# Patient Record
Sex: Female | Born: 1960 | Race: White | Hispanic: No | Smoking: Former smoker
Health system: Southern US, Community
[De-identification: ages and names within clinical notes are randomized; demographics above are authoritative.]

## PROBLEM LIST (undated history)

## (undated) DIAGNOSIS — F1020 Alcohol dependence, uncomplicated: Secondary | ICD-10-CM

## (undated) DIAGNOSIS — K089 Disorder of teeth and supporting structures, unspecified: Secondary | ICD-10-CM

## (undated) DIAGNOSIS — Z9981 Dependence on supplemental oxygen: Secondary | ICD-10-CM

## (undated) DIAGNOSIS — F32A Depression, unspecified: Secondary | ICD-10-CM

## (undated) DIAGNOSIS — N393 Stress incontinence (female) (male): Secondary | ICD-10-CM

## (undated) DIAGNOSIS — L409 Psoriasis, unspecified: Secondary | ICD-10-CM

## (undated) DIAGNOSIS — F329 Major depressive disorder, single episode, unspecified: Secondary | ICD-10-CM

## (undated) DIAGNOSIS — J449 Chronic obstructive pulmonary disease, unspecified: Secondary | ICD-10-CM

## (undated) DIAGNOSIS — J302 Other seasonal allergic rhinitis: Secondary | ICD-10-CM

## (undated) HISTORY — DX: Other seasonal allergic rhinitis: J30.2

## (undated) HISTORY — DX: Chronic obstructive pulmonary disease, unspecified: J44.9

## (undated) HISTORY — PX: TUBAL LIGATION: SHX77

## (undated) HISTORY — DX: Disorder of teeth and supporting structures, unspecified: K08.9

## (undated) HISTORY — DX: Alcohol dependence, uncomplicated: F10.20

---

## 1997-12-22 ENCOUNTER — Encounter: Admission: RE | Admit: 1997-12-22 | Discharge: 1997-12-22 | Payer: Self-pay | Admitting: Sports Medicine

## 1999-12-12 ENCOUNTER — Encounter: Admission: RE | Admit: 1999-12-12 | Discharge: 1999-12-12 | Payer: Self-pay | Admitting: Sports Medicine

## 2002-03-05 ENCOUNTER — Emergency Department (HOSPITAL_COMMUNITY): Admission: EM | Admit: 2002-03-05 | Discharge: 2002-03-05 | Payer: Self-pay | Admitting: Emergency Medicine

## 2008-02-04 ENCOUNTER — Ambulatory Visit: Payer: Self-pay | Admitting: Internal Medicine

## 2008-02-04 ENCOUNTER — Inpatient Hospital Stay (HOSPITAL_COMMUNITY): Admission: EM | Admit: 2008-02-04 | Discharge: 2008-02-09 | Payer: Self-pay | Admitting: Emergency Medicine

## 2008-02-05 ENCOUNTER — Encounter (INDEPENDENT_AMBULATORY_CARE_PROVIDER_SITE_OTHER): Payer: Self-pay | Admitting: Internal Medicine

## 2008-03-09 ENCOUNTER — Ambulatory Visit: Payer: Self-pay | Admitting: Internal Medicine

## 2008-03-09 DIAGNOSIS — F17200 Nicotine dependence, unspecified, uncomplicated: Secondary | ICD-10-CM

## 2008-03-09 DIAGNOSIS — F1020 Alcohol dependence, uncomplicated: Secondary | ICD-10-CM | POA: Insufficient documentation

## 2008-03-09 DIAGNOSIS — J449 Chronic obstructive pulmonary disease, unspecified: Secondary | ICD-10-CM

## 2008-03-31 ENCOUNTER — Ambulatory Visit: Payer: Self-pay | Admitting: Internal Medicine

## 2008-04-15 ENCOUNTER — Encounter: Payer: Self-pay | Admitting: Internal Medicine

## 2008-05-04 ENCOUNTER — Ambulatory Visit: Payer: Self-pay | Admitting: Family Medicine

## 2008-06-30 ENCOUNTER — Ambulatory Visit: Payer: Self-pay | Admitting: *Deleted

## 2008-06-30 ENCOUNTER — Ambulatory Visit: Payer: Self-pay | Admitting: Internal Medicine

## 2008-08-05 ENCOUNTER — Ambulatory Visit: Payer: Self-pay | Admitting: Internal Medicine

## 2009-07-28 ENCOUNTER — Ambulatory Visit: Payer: Self-pay | Admitting: Internal Medicine

## 2009-08-18 ENCOUNTER — Ambulatory Visit: Payer: Self-pay | Admitting: Internal Medicine

## 2009-09-23 ENCOUNTER — Ambulatory Visit: Payer: Self-pay | Admitting: Internal Medicine

## 2009-09-23 ENCOUNTER — Encounter (INDEPENDENT_AMBULATORY_CARE_PROVIDER_SITE_OTHER): Payer: Self-pay | Admitting: Adult Health

## 2009-09-23 ENCOUNTER — Telehealth (INDEPENDENT_AMBULATORY_CARE_PROVIDER_SITE_OTHER): Payer: Self-pay | Admitting: *Deleted

## 2009-09-23 LAB — CONVERTED CEMR LAB
Basophils Absolute: 0 10*3/uL (ref 0.0–0.1)
Basophils Relative: 0 % (ref 0–1)
Eosinophils Absolute: 0 10*3/uL (ref 0.0–0.7)
Eosinophils Relative: 0 % (ref 0–5)
HCT: 43.3 % (ref 36.0–46.0)
Hemoglobin: 14.5 g/dL (ref 12.0–15.0)
Lymphocytes Relative: 19 % (ref 12–46)
Lymphs Abs: 1.7 10*3/uL (ref 0.7–4.0)
MCHC: 33.5 g/dL (ref 30.0–36.0)
MCV: 95.8 fL (ref 78.0–100.0)
Monocytes Absolute: 1.1 10*3/uL — ABNORMAL HIGH (ref 0.1–1.0)
Monocytes Relative: 12 % (ref 3–12)
Neutro Abs: 6.4 10*3/uL (ref 1.7–7.7)
Neutrophils Relative %: 69 % (ref 43–77)
Platelets: 288 10*3/uL (ref 150–400)
RBC: 4.52 M/uL (ref 3.87–5.11)
RDW: 12.5 % (ref 11.5–15.5)
WBC: 9.2 10*3/uL (ref 4.0–10.5)

## 2009-11-24 ENCOUNTER — Ambulatory Visit: Payer: Self-pay | Admitting: Internal Medicine

## 2009-12-12 ENCOUNTER — Ambulatory Visit: Admission: RE | Admit: 2009-12-12 | Discharge: 2009-12-12 | Payer: Self-pay | Admitting: Internal Medicine

## 2010-08-01 ENCOUNTER — Ambulatory Visit: Payer: Self-pay | Admitting: Internal Medicine

## 2010-08-14 ENCOUNTER — Telehealth (INDEPENDENT_AMBULATORY_CARE_PROVIDER_SITE_OTHER): Payer: Self-pay | Admitting: *Deleted

## 2010-08-24 ENCOUNTER — Ambulatory Visit: Payer: Self-pay | Admitting: Internal Medicine

## 2010-08-24 DIAGNOSIS — F4323 Adjustment disorder with mixed anxiety and depressed mood: Secondary | ICD-10-CM

## 2010-09-12 ENCOUNTER — Telehealth: Payer: Self-pay | Admitting: Internal Medicine

## 2010-09-18 ENCOUNTER — Inpatient Hospital Stay (HOSPITAL_COMMUNITY)
Admission: EM | Admit: 2010-09-18 | Discharge: 2010-09-22 | Payer: Self-pay | Source: Home / Self Care | Attending: Internal Medicine | Admitting: Internal Medicine

## 2010-09-19 ENCOUNTER — Telehealth: Payer: Self-pay | Admitting: Internal Medicine

## 2010-09-20 ENCOUNTER — Other Ambulatory Visit: Payer: Self-pay | Admitting: Internal Medicine

## 2010-09-20 LAB — BASIC METABOLIC PANEL
BUN: 9 mg/dL (ref 6–23)
CO2: 31 mEq/L (ref 19–32)
Calcium: 8.9 mg/dL (ref 8.4–10.5)
Chloride: 95 mEq/L — ABNORMAL LOW (ref 96–112)
Creatinine, Ser: 0.65 mg/dL (ref 0.4–1.2)
GFR calc Af Amer: >60 mL/min (ref >60)
GFR calc non Af Amer: >60 mL/min (ref >60)
Glucose, Bld: 140 mg/dL — ABNORMAL HIGH (ref 70–99)
Potassium: 4.1 mEq/L (ref 3.5–5.1)
Sodium: 133 mEq/L — ABNORMAL LOW (ref 135–145)

## 2010-09-20 LAB — CBC
HCT: 42.3 % (ref 36.0–46.0)
Hemoglobin: 14 g/dL (ref 12.0–15.0)
MCH: 32.1 pg (ref 26.0–34.0)
MCHC: 33.1 g/dL (ref 30.0–36.0)
MCV: 97 fL (ref 78.0–100.0)
Platelets: 245 10*3/uL (ref 150–400)
RBC: 4.36 MIL/uL (ref 3.87–5.11)
RDW: 12.2 % (ref 11.5–15.5)
WBC: 10.3 10*3/uL (ref 4.0–10.5)

## 2010-09-21 LAB — BASIC METABOLIC PANEL
BUN: 9 mg/dL (ref 6–23)
CO2: 31 mEq/L (ref 19–32)
Calcium: 9 mg/dL (ref 8.4–10.5)
Chloride: 96 mEq/L (ref 96–112)
Creatinine, Ser: 0.82 mg/dL (ref 0.4–1.2)
GFR calc Af Amer: >60 mL/min (ref >60)
GFR calc non Af Amer: >60 mL/min (ref >60)
Glucose, Bld: 127 mg/dL — ABNORMAL HIGH (ref 70–99)
Potassium: 3.3 mEq/L — ABNORMAL LOW (ref 3.5–5.1)
Sodium: 136 mEq/L (ref 135–145)

## 2010-09-21 LAB — TSH: TSH: 0.197 u[IU]/mL — ABNORMAL LOW (ref 0.350–4.500)

## 2010-09-21 LAB — T4, FREE: Free T4: 0.9 ng/dL (ref 0.80–1.80)

## 2010-10-10 ENCOUNTER — Encounter: Payer: Self-pay | Admitting: Internal Medicine

## 2010-10-19 NOTE — Progress Notes (Signed)
Summary: ventalin refill  Phone Note Call from Patient Call back at Home Phone [phone]   Caller: Patient Call For: young Reason for Call: Talk to Nurse Summary of Call: Patient needing ventalin refill. [facility] Initial call taken by: Lehman Prom,  September 12, 2010 3:20 PM  Follow-up for Phone Call        refill sent. pt aware.Carron Curie CMA  September 12, 2010 4:10 PM     Prescriptions: VENTOLIN HFA 108 (90 BASE) MCG/ACT AERS (ALBUTEROL SULFATE) 2 puffs four times a day as needed  #1 x 3   Entered by:   Carron Curie CMA   Authorized by:   Waymon Budge MD   Signed by:   Carron Curie CMA on 09/12/2010   Method used:   [facility]        [facility] [phone][facility] (retail)       [address]       [place], [place]  [address]       Ph: [phone]       Fax: [phone]   RxID:   [id number]

## 2010-10-19 NOTE — Assessment & Plan Note (Signed)
Summary: switch from MR to CY//td   Copy to:  Dr. Marcellus Scott Primary Provider/Referring Provider:  [facility]  CC:  Pulmonary reestblish-changed from MR to CDY.Marland Kitchen  History of Present Illness: 2010/09/17- 49 yoF seeking to change care from [facility] since her physician there is leaving. Long time smoker with known COPD. Treated here previously in 2009 . PFT then confirmed severe COPD with FEV1 28% predicted. More recent PFT at [facility] in 3/ 2011 w/ results pending. Trying to stop smoking using patches.  DOE with usual activities, less than 1 flight of stairs, making bed,  and sometimes at rest. Complains of insomnia, but breathing doesn't wake her. Daily cough with clear phlegm. Extremes of heat and cold take her breath. Never had O2. Spiriva worked well, but couldn't afford it. Advair less helpful. Has used a lot of Primatine which is going off market. Uses Ventolin 3-4 x/ day, or home neb albutrerol 0.083.  Seasonal nasal congestion and nasal stuffiness with weather changes. Hosp 5/ 2009 w/ acute exacerbation of COPD. Asthma since childhood. Denies pneumonia. Had pneumovax 2009, current flu vax.Chronic use of xanax to control anxiety- also helps her dyspnea. Denies heart disease.   Preventive Screening-Counseling & Management  Alcohol-Tobacco     Smoking Status: current     Smoking Cessation Counseling: yes     Smoke Cessation Stage: contemplative     Packs/Day: 2-3 cigs a day     Year Started: age 48     Tobacco Counseling: to quit use of tobacco products  Comments: average 1 PPD  Current Medications (verified): 1)  Advair Diskus 500-50 Mcg/dose Aepb (Fluticasone-Salmeterol) .Marland Kitchen.. 1 Puff Two Times A Day and Rinse Mouth After Use 2)  Ventolin Hfa 108 (90 Base) Mcg/act Aers (Albuterol Sulfate) .... 2 Puffs Four Times A Day As Needed 3)  Alprazolam 0.5 Mg Tabs (Alprazolam) .... Take 1 By Mouth Three Times A Day As Needed 4)  Nasacort Aq 55 Mcg/act Aers (Triamcinolone Acetonide)  .Marland Kitchen.. 1-2 Sprays  in Each Nostril Daily As Needed  Allergies (verified): 1)  ! * Pain Medications  Past History:  Family History: Last updated: 09/17/10 Father- died Lung cancer, COPD Mother A&W  Social History: Last updated: 2010/09/17 She drinks 5-6 cans of beer daily.  Current smoker-  One to two packs per day lifelong smoker. Recently trying to quit - has  cut down to 8 cigarettes a night. 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 Multiple episodes of bronchitis. Tyypically, treats herself with Primatene Mist. Prior to May 2009 never has       taken antibiotics or steroids or been to urgent care or M.D. visits.  No prior ICU admissions, ventilator admits, ER visits, or prednisone courses. Admitted, May 2009 for AE-COPD to [facility]  2. Asthma.  Chronic severe asthma for which she takes Primatene Mist       p.r.n. since childhood.   3. Seasonal allergies.   4. Poor dentition.  5. Alcoholism - Mild withdrawal 01/2008 admit for AE-COPD 6. Normal ECHO 01/2008 7. COPD diagnosed 01/2008 based on clinical admit for AE-COPD, cxr findings.                                   -PFT 03/31/08- FVC 2.16/ 70%; FEV1 0.66/  28%; R 0.31, DLCO  8. Normal A1AT level 5.2009 - 170 9. Inadequate Material resources  Past Surgical History: C-sections x 2 Tubal ligation  Family History: Father- died Lung cancer, COPD Mother A&W  Social History: She drinks 5-6 cans of beer daily.  Current smoker-  One to two packs per day lifelong smoker. Recently trying to quit - has  cut down to 8 cigarettes a night. 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  The patient denies coughing up blood, chest pain, irregular heartbeats, acid heartburn, indigestion, loss of appetite, weight change, abdominal pain, difficulty swallowing, sore throat, tooth/dental problems, headaches, nasal congestion/difficulty breathing through nose, sneezing, itching, ear ache, depression, hand/feet swelling, joint stiffness or pain, rash, change in color of mucus, and fever.    Vital Signs:  Patient profile:   50 year old female Height:      61 inches Weight:      112.25 pounds BMI:     21.29 O2 Sat:      92 % on Room air Pulse rate:   100 / minute BP sitting:   122 / 80  (left arm) Cuff size:   regular  Vitals Entered By: Reynaldo Minium CMA (August 24, 2010 11:22 AM)  O2 Flow:  Room air CC: Pulmonary reestblish-changed from MR to CDY.   Physical Exam  Additional Exam:  General: A/Ox3; pleasant and cooperative, NAD, small tense woman SKIN: no rash, lesions NODES: no lymphadenopathy HEENT: [place]/AT, EOM- WNL, Conjuctivae- clear, PERRLA, TM-WNL, Nose- clear, Throat- clear and wnl, partial plate,Mallampati  II NECK: Supple w/ fair ROM, JVD- none, normal carotid impulses w/o bruits Thyroid- normal to palpation CHEST: Distant, wiithout wheeze, rhonchi HEART: RRR, no m/g/r heard ABDOMEN: Soft and nl; nml bowel sounds; no organomegaly or masses noted WUJ:WJXB, nl pulses, no edema  NEURO: Grossly intact to observation      Impression & Recommendations:  Problem # 1:  C O P D (ICD-496) Severe COPD. If she can afford it with assistance we will try to get her restarted on Spiriva. We need results of PFT done at [facility] earlier this year.  Problem # 2:  TOBACCO ABUSE (ICD-305.1)  We have begun discussion and will give info on smoking cessation program at [facility] The following medications were removed from the medication list:    [facility] 14 Mg/24hr Pt24 (Nicotine) ..... Once daily  Problem # 3:  ANXIETY STATE,  UNSPECIFIED (ICD-300.00) Clonazepam made her "loopy". She says she does better with xanax. We will try to help with her request to establish with a new primary physician.  Her updated medication list for this problem includes:    Alprazolam 0.5 Mg Tabs (Alprazolam) .Marland Kitchen... Take 1 by mouth three times a day as needed    Alprazolam 0.5 Mg Tabs (Alprazolam) .Marland Kitchen... 1 three times a day as needed  Medications Added to Medication List This Visit: 1)  Advair Diskus 500-50 Mcg/dose Aepb (Fluticasone-salmeterol) .Marland Kitchen.. 1 puff two times a day and rinse mouth after use 2)  Ventolin Hfa 108 (90 Base) Mcg/act Aers (Albuterol sulfate) .... 2 puffs four times a day as needed 3)  Alprazolam 0.5 Mg Tabs (Alprazolam) .... Take 1 by mouth three times a  day as needed 4)  Nasacort Aq 55 Mcg/act Aers (Triamcinolone acetonide) .Marland Kitchen.. 1-2 sprays  in each nostril daily as needed 5)  Spiriva Handihaler 18 Mcg Caps (Tiotropium bromide monohydrate) .Marland Kitchen.. 1 daily 6)  Alprazolam 0.5 Mg Tabs (Alprazolam) .Marland Kitchen.. 1 three times a day as needed  Other Orders: New Patient Level III (54098) T-2 View CXR (71020TC) Misc. Referral (Misc. Ref)  Patient Instructions: 1)  Please schedule a follow-up appointment in 2 months. 2)  A chest x-ray has been recommended.  Your imaging study may require preauthorization.  3)  Script for alprazolam/ Xanax 4)  Script for Spiriva. See [facility] to see if you might be a candidate for assistance program.  5)  Please try very hard to get off the last of those cigarettes.  6)  Information on [facility] smoking cessation program.  Prescriptions: ALPRAZOLAM 0.5 MG TABS (ALPRAZOLAM) 1 three times a day as needed  #90 x 5   Entered and Authorized by:   Waymon Budge MD   Signed by:   Waymon Budge MD on 08/24/2010   Method used:   Print then Give to Patient   RxID:   [id number] SPIRIVA HANDIHALER 18 MCG CAPS (TIOTROPIUM BROMIDE MONOHYDRATE) 1 daily  #30 x prn   Entered and Authorized by:   Waymon Budge  MD   Signed by:   Waymon Budge MD on 08/24/2010   Method used:   Print then Give to Patient   RxID:   [id number]   Prevention & Chronic Care Immunizations   Influenza vaccine: Not documented    Tetanus booster: Not documented    Pneumococcal vaccine: Not documented  Colorectal Screening   Hemoccult: Not documented    Colonoscopy: Not documented  Other Screening   Pap smear: Not documented    Mammogram: Not documented   Smoking status: current  (08/24/2010)   Smoking cessation counseling: yes  (08/24/2010)  Lipids   Total Cholesterol: Not documented   LDL: Not documented   LDL Direct: Not documented   HDL: Not documented   Triglycerides: Not documented

## 2010-10-19 NOTE — Progress Notes (Signed)
Summary: req to change pulm doc  Phone Note Call from Patient Call back at [facility] Phone [phone]   Caller: Patient Call For: ramaswamy Summary of Call: pt wants to change drs. she wants to see dr young for copd/ emphysema. says her father Darcel Bayley saw cy 18yrs ago.  Initial call taken by: Tivis Ringer, CNA,  August 14, 2010 11:33 AM  Follow-up for Phone Call        Spoke with patient-states she would like to change from MR to CDY; Pt understands that MR must agree that pt can change drs and CDY will need to agree as well. Will send message to MR to give ok and then have him send message to [facility] for approval.[name] [facility] CMA  August 14, 2010 12:25 PM     Additional Follow-up for Phone Call Additional follow up Details #1::        ok with me Additional Follow-up by: Kalman Shan MD,  August 14, 2010 12:32 PM    Additional Follow-up for Phone Call Additional follow up Details #2::    Per CDY-ok with him to take on patient-please put pt in next OV spot for CS5.Reynaldo Minium CMA  August 14, 2010 3:51 PM   appt made for pt to see cy on 12/8 at 11:15--pt aware Follow-up by: Philipp Deputy CMA,  August 14, 2010 4:00 PM

## 2010-10-19 NOTE — Progress Notes (Signed)
Summary: WANTS TO TALK TO [name]/CB  Phone Note From Other Clinic   Caller: MOSESCONE-HENRYETTA [phone] PAGER Call For: Wendy [name] Summary of Call: WANTS TO SPEAK TO [name] Initial call taken by: Lacinda Axon,  September 19, 2010 3:18 PM  Follow-up for Phone Call        Spoke with henryetta at [facility] and she states the pt told her that Florentina Addison was working on getting the pt set up with [facility] primry care, but whenthey called the primary care they do not accept [employer]. I advisd this was correct and that we advise pt with this insurance to call their medicaid case worker and get a list of PCP in area that accept this insurance. Carron Curie CMA  September 19, 2010 4:57 PM

## 2010-10-19 NOTE — Progress Notes (Signed)
Summary: Triage/Congestion/COPD/EMPHYSEMA  Phone Note Call from Patient   Caller: Patient Reason for Call: Talk to Nurse Summary of Call: Patient states she has been sick for 3 days with cough and congestion...she has a hx of copd and emphysema and states she is having difficulty breathing..She is coughing over the phone and her cough sounds ver tight..She has been using Mucinex D and drinking juices and feels she is getting worse.Marland KitchenAppointment made with Amy... Initial call taken by: Conchita Paris,  September 23, 2009 10:02 AM

## 2010-10-24 ENCOUNTER — Ambulatory Visit: Payer: Self-pay | Admitting: Internal Medicine

## 2010-10-25 NOTE — Letter (Signed)
Summary: CMN for [facility]  CMN for [facility]   Imported By: Sherian Rein 10/16/2010 11:01:10  _____________________________________________________________________  External Attachment:    Type:   Image     Comment:   External Document

## 2010-10-27 ENCOUNTER — Encounter: Payer: Self-pay | Admitting: Internal Medicine

## 2010-11-06 ENCOUNTER — Encounter: Payer: Self-pay | Admitting: Internal Medicine

## 2010-11-06 ENCOUNTER — Ambulatory Visit (INDEPENDENT_AMBULATORY_CARE_PROVIDER_SITE_OTHER): Payer: Medicaid Other | Admitting: Internal Medicine

## 2010-11-06 DIAGNOSIS — J310 Chronic rhinitis: Secondary | ICD-10-CM | POA: Insufficient documentation

## 2010-11-06 DIAGNOSIS — F411 Generalized anxiety disorder: Secondary | ICD-10-CM

## 2010-11-06 DIAGNOSIS — J449 Chronic obstructive pulmonary disease, unspecified: Secondary | ICD-10-CM

## 2010-11-06 DIAGNOSIS — F17200 Nicotine dependence, unspecified, uncomplicated: Secondary | ICD-10-CM

## 2010-11-14 ENCOUNTER — Telehealth (INDEPENDENT_AMBULATORY_CARE_PROVIDER_SITE_OTHER): Payer: Self-pay | Admitting: *Deleted

## 2010-11-14 NOTE — Assessment & Plan Note (Signed)
Summary: 2 mth//jwr   Copy to:  Dr. Marcellus Scott Primary Provider/Referring Provider:  [facility]  CC:  2 month follow up visit-COPD; ? sinus infection-Left side nasal area stopped up and bleeding at times; has good and bad days-when Increased activity..  History of Present Illness:  History of Present Illness: August 24, 2010- 49 yoF seeking to change care from [facility] since her physician there is leaving. Long time smoker with known COPD. Treated here previously in 2009 . PFT then confirmed severe COPD with FEV1 28% predicted. More recent PFT at [facility] in 3/ 2011 w/ results pending. Trying to stop smoking using patches.  DOE with usual activities, less than 1 flight of stairs, making bed,  and sometimes at rest. Complains of insomnia, but breathing doesn't wake her. Daily cough with clear phlegm. Extremes of heat and cold take her breath. Never had O2. Spiriva worked well, but couldn't afford it. Advair less helpful. Has used a lot of Primatine which is going off market. Uses Ventolin 3-4 x/ day, or home neb albutrerol 0.083.  Seasonal nasal congestion and nasal stuffiness with weather changes. Hosp 5/ 2009 w/ acute exacerbation of COPD. Asthma since childhood. Denies pneumonia. Had pneumovax 2009, current flu vax.Chronic use of xanax to control anxiety- also helps her dyspnea. Denies heart disease.   November 06, 2010-  COPD Nurse-CC: 2 month follow up visit-COPD; ? sinus infection-Left side nasal area stopped up and bleeding at times; has good and bad days-when Increased activity. Hosp f/u- Was hosp in January with AECOPD. She told the hospitalist that she began getting worse when taken off Advair, so she is now back on it. Handicapped Parking. She quit smoking August 21, 2010. Still has a home health nurse coming to home.  Using oxygen most of each day and all night. Feels short of breath walking in home without it. Gained weight on prednisone from hospital- done now. Nose stays  congested.    Preventive Screening-Counseling & Management  Alcohol-Tobacco     Smoking Status: quit     Smoking Cessation Counseling: yes     Smoke Cessation Stage: quit     Packs/Day: 2-3 cigs a day     Year Started: age 78     Year Quit: 08-2010     Tobacco Counseling: not to resume use of tobacco products  Comments: Quit 12/5/`ll- Continued abstinence strongly supported.   Current Medications (verified): 1)  Advair Diskus 500-50 Mcg/dose Aepb (Fluticasone-Salmeterol) .Marland Kitchen.. 1 Puff Two Times A Day and Rinse Mouth After Use 2)  Ventolin Hfa 108 (90 Base) Mcg/act Aers (Albuterol Sulfate) .... 2 Puffs Four Times A Day As Needed 3)  Alprazolam 0.5 Mg Tabs (Alprazolam) .... Take 1 By Mouth Three Times A Day As Needed 4)  Spiriva Handihaler 18 Mcg Caps (Tiotropium Bromide Monohydrate) .Marland Kitchen.. 1 Daily 5)  Alprazolam 0.5 Mg Tabs (Alprazolam) .Marland Kitchen.. 1 Three Times A Day As Needed 6)  Fluticasone Propionate 50 Mcg/act Susp (Fluticasone Propionate) .... 2 Sprays in Each Nostril Daily 7)  Albuterol Sulfate (2.5 Mg/70ml) 0.083% Nebu (Albuterol Sulfate) .Marland Kitchen.. 1 Vial Via Nebulizer Four Times A Day As Needed 8)  Oxygen 2l/m .... Use As Directed As Needed  Allergies: 1)  ! * Pain Medications 2)  ! Joyce Copa  Past History:  Past Medical History: Last updated: 08/24/2010 1. Multiple episodes of bronchitis. Tyypically, treats herself with Primatene Mist. Prior to May 2009 never has       taken antibiotics or steroids or  been to urgent care or M.D. visits.  No prior ICU admissions, ventilator admits, ER visits, or prednisone courses. Admitted, May 2009 for AE-COPD to [facility]  2. Asthma.  Chronic severe asthma for which she takes Primatene Mist       p.r.n. since childhood.   3. Seasonal allergies.   4. Poor dentition.  5. Alcoholism - Mild withdrawal 01/2008 admit for AE-COPD 6. Normal ECHO 01/2008 7. COPD diagnosed 01/2008 based on clinical admit for AE-COPD, cxr findings.                                    -PFT 03/31/08- FVC 2.16/ 70%; FEV1 0.66/ 28%; R 0.31, DLCO  8. Normal A1AT level 5.2009 - 170 9. Inadequate Material resources  Past Surgical History: Last updated: 12-Sep-2010 C-sections x 2 Tubal ligation  Family History: Last updated: 2010-09-12 Father- died Lung cancer, COPD Mother A&W  Social History: Last updated: 09-12-10 She drinks 5-6 cans of beer daily.  Current smoker-  One to two packs per day lifelong smoker. Recently trying to quit - has  cut down to 8 cigarettes a night. 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  The patient denies shortness of breath at rest, productive cough, non-productive cough, coughing up blood, chest pain, irregular heartbeats, acid heartburn, indigestion, loss of appetite, weight change, abdominal pain, difficulty swallowing, sore throat, tooth/dental problems, headaches, sneezing, and itching.    Vital Signs:  Patient profile:   50 year old female Height:      61 inches Weight:      123.13 pounds BMI:     23.35 O2 Sat:      94 % on Room air Pulse rate:   114 / minute BP sitting:   100 / 78  (left arm) Cuff size:   regular  Vitals Entered By: Reynaldo Minium CMA (November 06, 2010 11:26 AM)  O2 Flow:  Room air CC: 2 month follow up visit-COPD; ? sinus infection-Left side nasal area stopped up and bleeding at times; has good and bad days-when Increased activity.   Physical Exam  Additional Exam:  General: A/Ox3; pleasant and cooperative, NAD,  SKIN: no rash, lesions NODES: no lymphadenopathy HEENT: [place]/AT, EOM- WNL, Conjuctivae- clear, PERRLA, TM-WNL, Nose- sniffing, Throat- clear and wnl, partial  plate,Mallampati  II NECK: Supple w/ fair ROM, JVD- none, normal carotid impulses w/o bruits Thyroid- normal to palpation CHEST: Distant, wiithout wheeze, rhonchi HEART: RRR, no m/g/r heard ABDOMEN: not overweight EAV:WUJW, nl pulses, no edema  NEURO: Grossly intact to observation      Impression & Recommendations:  Problem # 1:  C O P D (ICD-496) Recent exacerbation seems to be resolving. she does do better for now back on Advair. She can be off O2 while sitting.  There may be some further improvement with time off cigarettes. if she becomes stable, then Pulmonary Rehab might be considered.   Problem # 2:  TOBACCO ABUSE (ICD-305.1)  Congratulations and ongoing support to keep her off.   Orders: Est. Patient Level III (11914)  Problem # 3:  CHRONIC RHINITIS (ICD-472.0)  More aware of nasal congestion- introduced to Neti pot.   Orders: Est. Patient Level III (16109)  Problem # 4:  ANXIETY STATE, UNSPECIFIED (ICD-300.00)  Asks to increase Xanax to 1 mg. Discussed.  Her updated medication list for this problem includes:    Alprazolam 0.5 Mg Tabs (Alprazolam) .Marland Kitchen... Take 1 by mouth three times a day as needed    Alprazolam 1 Mg Tabs (Alprazolam) .Marland Kitchen... 1 three times a day as needed avoid constant use.  Orders: Est. Patient Level III (60454)  Medications Added to Medication List This Visit: 1)  Alprazolam 1 Mg Tabs (Alprazolam) .Marland Kitchen.. 1 three times a day as needed avoid constant use. 2)  Fluticasone Propionate 50 Mcg/act Susp (Fluticasone propionate) .... 2 sprays in each nostril daily 3)  Albuterol Sulfate (2.5 Mg/66ml) 0.083% Nebu (Albuterol sulfate) .Marland Kitchen.. 1 vial via nebulizer four times a day as needed 4)  Oxygen 2l/m  .... Use as directed as needed  Patient Instructions: 1)  Please schedule a follow-up appointment in 3 months. 2)  I am really proud of you for getting off the cigarettes.  3)  Ok to be off Oxygen when sitting quietly. 4)  Try using a Neti pot to rinse your  nasal passages when ever you feel  congested.  5)  continue Advair and Spiriva 6)  Refill fluticasone 7)  Refill Xanax- change to 1 mg . use less or none, whenever you can get by with it. Thjis can be habit forming.  Prescriptions: FLUTICASONE PROPIONATE 50 MCG/ACT SUSP (FLUTICASONE PROPIONATE) 2 sprays in each nostril daily  #1 x prn   Entered and Authorized by:   Waymon Budge MD   Signed by:   Waymon Budge MD on 11/06/2010   Method used:   Print then Give to Patient   RxID:   [id number] ALPRAZOLAM 1 MG TABS (ALPRAZOLAM) 1 three times a day as needed Avoid constant use.  #90 x 5   Entered and Authorized by:   Waymon Budge MD   Signed by:   Waymon Budge MD on 11/06/2010   Method used:   Print then Give to Patient   RxID:   [id number]

## 2010-11-14 NOTE — Miscellaneous (Signed)
Summary: [facility]  [facility]   Imported By: Sherian Rein 11/06/2010 12:11:47  _____________________________________________________________________  External Attachment:    Type:   Image     Comment:   External Document

## 2010-11-23 NOTE — Progress Notes (Signed)
Summary: sinus cough> augmentin rx  Phone Note Call from Patient Call back at Home Phone [phone]   Caller: Patient Call For: young Summary of Call: Pt states he sinus has gotten worse since last week c/o sore throat, draining into her chest coughing up yellowish/green phlegm x2 days pls advise.//[facility] Initial call taken by: Darletta Moll,  November 14, 2010 8:33 AM  Follow-up for Phone Call        Called spoke with pt.  c/o PND, sore throat, prod cough with dark yellow mucus, sinus pressure, some chest tightness, increased SOB - using o2 all the time now.  Sxs have gotten worse since OV on 11/06/10 with CDY.  States she is using neti pot.  Requesting abx.  Dr. Maple Hudson, pls advise.  Allergies - verified:  ! * PAIN MEDICATIONS ! * ALLEGRA  [facility]  **Requesting call back before lunch as she wants to let home health nurse know something.  Follow-up by: Gweneth Dimitri RN,  November 14, 2010 10:02 AM  Additional Follow-up for Phone Call Additional follow up Details #1::        Per CDY-okay to give Augmentin 500mg  #20 take 1 by mouth two times a day no refills.Reynaldo Minium CMA  November 14, 2010 10:10 AM   Called, spoke with pt.  She was informed of above recs per CDY and verbalized understanding.  [facility]  Will call back if sxs do not improve or worsen.   Additional Follow-up by: Gweneth Dimitri RN,  November 14, 2010 10:18 AM    New/Updated Medications: AUGMENTIN 500-125 MG TABS (AMOXICILLIN-POT CLAVULANATE) Take 1 tablet by mouth two times a day Prescriptions: AUGMENTIN 500-125 MG TABS (AMOXICILLIN-POT CLAVULANATE) Take 1 tablet by mouth two times a day  #20 x 0   Entered by:   Gweneth Dimitri RN   Authorized by:   Waymon Budge MD   Signed by:   Gweneth Dimitri RN on 11/14/2010   Method used:   [facility]  [address] [phone] ([facility]ail)       [address]       [place], [place]  [address]       Ph:  [phone]       Fax: [phone]   RxID:   [id number]

## 2010-11-27 LAB — LIPID PANEL
Cholesterol: 149 mg/dL (ref 0–200)
HDL: 111 mg/dL (ref >39)
Total CHOL/HDL Ratio: 1.3 RATIO
VLDL: 4 mg/dL (ref 0–40)

## 2010-11-27 LAB — DIFFERENTIAL
Basophils Absolute: 0 10*3/uL (ref 0.0–0.1)
Basophils Absolute: 0.1 10*3/uL (ref 0.0–0.1)
Basophils Relative: 0 % (ref 0–1)
Basophils Relative: 1 % (ref 0–1)
Eosinophils Absolute: 0 10*3/uL (ref 0.0–0.7)
Eosinophils Absolute: 0.3 10*3/uL (ref 0.0–0.7)
Eosinophils Relative: 0 % (ref 0–5)
Eosinophils Relative: 4 % (ref 0–5)
Lymphocytes Relative: 17 % (ref 12–46)
Lymphocytes Relative: 9 % — ABNORMAL LOW (ref 12–46)
Lymphs Abs: 0.4 10*3/uL — ABNORMAL LOW (ref 0.7–4.0)
Lymphs Abs: 1.2 10*3/uL (ref 0.7–4.0)
Monocytes Absolute: 0.2 10*3/uL (ref 0.1–1.0)
Monocytes Absolute: 0.8 10*3/uL (ref 0.1–1.0)
Monocytes Relative: 11 % (ref 3–12)
Monocytes Relative: 4 % (ref 3–12)
Neutro Abs: 3.9 10*3/uL (ref 1.7–7.7)
Neutro Abs: 4.8 10*3/uL (ref 1.7–7.7)
Neutrophils Relative %: 67 % (ref 43–77)
Neutrophils Relative %: 87 % — ABNORMAL HIGH (ref 43–77)

## 2010-11-27 LAB — POCT I-STAT 3, ART BLOOD GAS (G3+)
Acid-Base Excess: 4 mmol/L — ABNORMAL HIGH (ref 0.0–2.0)
Bicarbonate: 30.8 mEq/L — ABNORMAL HIGH (ref 20.0–24.0)
O2 Saturation: 92 %
Patient temperature: 97.8
TCO2: 32 mmol/L (ref 0–100)
pCO2 arterial: 50.2 mmHg — ABNORMAL HIGH (ref 35.0–45.0)
pH, Arterial: 7.394 (ref 7.350–7.400)
pO2, Arterial: 65 mmHg — ABNORMAL LOW (ref 80.0–100.0)

## 2010-11-27 LAB — POCT I-STAT, CHEM 8
BUN: <3 mg/dL — ABNORMAL LOW (ref 6–23)
Calcium, Ion: 1.02 mmol/L — ABNORMAL LOW (ref 1.12–1.32)
Chloride: 92 mEq/L — ABNORMAL LOW (ref 96–112)
Creatinine, Ser: 0.7 mg/dL (ref 0.4–1.2)
Glucose, Bld: 119 mg/dL — ABNORMAL HIGH (ref 70–99)
HCT: 50 % — ABNORMAL HIGH (ref 36.0–46.0)
Hemoglobin: 17 g/dL — ABNORMAL HIGH (ref 12.0–15.0)
Potassium: 4.4 mEq/L (ref 3.5–5.1)
Sodium: 132 mEq/L — ABNORMAL LOW (ref 135–145)
TCO2: 34 mmol/L (ref 0–100)

## 2010-11-27 LAB — CARDIAC PANEL(CRET KIN+CKTOT+MB+TROPI)
CK, MB: 8.2 ng/mL (ref 0.3–4.0)
CK, MB: 8.8 ng/mL (ref 0.3–4.0)
Relative Index: 3.3 — ABNORMAL HIGH (ref 0.0–2.5)
Relative Index: 3.5 — ABNORMAL HIGH (ref 0.0–2.5)
Total CK: 234 U/L — ABNORMAL HIGH (ref 7–177)
Total CK: 264 U/L — ABNORMAL HIGH (ref 7–177)
Troponin I: 0.04 ng/mL (ref 0.00–0.06)
Troponin I: 0.05 ng/mL (ref 0.00–0.06)

## 2010-11-27 LAB — CBC
HCT: 44.5 % (ref 36.0–46.0)
HCT: 46.5 % — ABNORMAL HIGH (ref 36.0–46.0)
Hemoglobin: 15 g/dL (ref 12.0–15.0)
Hemoglobin: 15.4 g/dL — ABNORMAL HIGH (ref 12.0–15.0)
MCH: 32 pg (ref 26.0–34.0)
MCH: 32.2 pg (ref 26.0–34.0)
MCHC: 33.1 g/dL (ref 30.0–36.0)
MCHC: 33.7 g/dL (ref 30.0–36.0)
MCV: 95.5 fL (ref 78.0–100.0)
MCV: 96.7 fL (ref 78.0–100.0)
Platelets: 207 10*3/uL (ref 150–400)
Platelets: 245 10*3/uL (ref 150–400)
RBC: 4.66 MIL/uL (ref 3.87–5.11)
RBC: 4.81 MIL/uL (ref 3.87–5.11)
RDW: 12 % (ref 11.5–15.5)
RDW: 12.1 % (ref 11.5–15.5)
WBC: 4.4 10*3/uL (ref 4.0–10.5)
WBC: 7.2 10*3/uL (ref 4.0–10.5)

## 2010-11-27 LAB — COMPREHENSIVE METABOLIC PANEL
ALT: 38 U/L — ABNORMAL HIGH (ref 0–35)
AST: 34 U/L (ref 0–37)
Albumin: 3.8 g/dL (ref 3.5–5.2)
Alkaline Phosphatase: 84 U/L (ref 39–117)
BUN: 4 mg/dL — ABNORMAL LOW (ref 6–23)
CO2: 33 mEq/L — ABNORMAL HIGH (ref 19–32)
Calcium: 9.4 mg/dL (ref 8.4–10.5)
Chloride: 96 mEq/L (ref 96–112)
Creatinine, Ser: 0.81 mg/dL (ref 0.4–1.2)
GFR calc Af Amer: >60 mL/min (ref >60)
GFR calc non Af Amer: >60 mL/min (ref >60)
Glucose, Bld: 145 mg/dL — ABNORMAL HIGH (ref 70–99)
Potassium: 4.1 mEq/L (ref 3.5–5.1)
Sodium: 136 mEq/L (ref 135–145)
Total Bilirubin: 0.3 mg/dL (ref 0.3–1.2)
Total Protein: 7.9 g/dL (ref 6.0–8.3)

## 2010-11-27 LAB — CK TOTAL AND CKMB (NOT AT ARMC)
CK, MB: 7.8 ng/mL (ref 0.3–4.0)
Relative Index: 4.6 — ABNORMAL HIGH (ref 0.0–2.5)
Total CK: 169 U/L (ref 7–177)

## 2010-11-27 LAB — TROPONIN I: Troponin I: 0.05 ng/mL (ref 0.00–0.06)

## 2010-11-27 LAB — PROTIME-INR
INR: 0.99 (ref 0.00–1.49)
Prothrombin Time: 13.3 seconds (ref 11.6–15.2)

## 2010-11-27 LAB — MAGNESIUM: Magnesium: 2.1 mg/dL (ref 1.5–2.5)

## 2010-11-27 LAB — URINALYSIS, MICROSCOPIC ONLY
Bilirubin Urine: NEGATIVE
Ketones, ur: NEGATIVE mg/dL
Nitrite: NEGATIVE
pH: 6.5 (ref 5.0–8.0)

## 2010-11-27 LAB — PHOSPHORUS: Phosphorus: 5.1 mg/dL — ABNORMAL HIGH (ref 2.3–4.6)

## 2010-11-27 LAB — HEMOGLOBIN A1C
Hgb A1c MFr Bld: 5.6 % (ref <5.7)
Mean Plasma Glucose: 114 mg/dL (ref <117)

## 2010-11-27 LAB — D-DIMER, QUANTITATIVE: D-Dimer, Quant: 0.8 ug/mL-FEU — ABNORMAL HIGH (ref 0.00–0.48)

## 2010-12-05 ENCOUNTER — Telehealth: Payer: Self-pay | Admitting: Internal Medicine

## 2010-12-05 NOTE — Telephone Encounter (Signed)
Spoke with patient-sinus trouble and congestion draining into chest, cough-when productive its clear in color x 2-3 days. Denies any fever or chills. Has not tried any OTC meds and would like advice from CDY. C/O of SOB more easily.     [facility]arm[facility] [address]  Allergies: allegra, pain medications.

## 2010-12-05 NOTE — Telephone Encounter (Signed)
Recommend loratadine-D which she can sign for at pharmacy counter.

## 2010-12-05 NOTE — Telephone Encounter (Signed)
Spoke with patient-she is aware to get Claritin D OTC and if this doesn't work for her then she is to call the office back.

## 2011-01-01 ENCOUNTER — Other Ambulatory Visit: Payer: Self-pay | Admitting: Internal Medicine

## 2011-01-17 ENCOUNTER — Other Ambulatory Visit: Payer: Self-pay | Admitting: Internal Medicine

## 2011-01-18 ENCOUNTER — Telehealth: Payer: Self-pay | Admitting: Internal Medicine

## 2011-01-18 NOTE — Telephone Encounter (Signed)
Pt notified that rx for ventolin was [facility] to [facility] on [address] on 01/02/2011 by Florentina Addison. She will need to keep OV with CDY on 02/05/2011 to discuss inhaler use.

## 2011-01-30 NOTE — H&P (Signed)
NAME:  TYJANAE, BARTEK              ACCOUNT NO.:  [id number]   MEDICAL RECORD NO.:  [id number]          PATIENT TYPE:  INP   LOCATION:  1827                         FACILITY:  [facility]   PHYSICIAN:  Marcellus Scott, MD     DATE OF BIRTH:  [birth year]   DATE OF ADMISSION:  02/04/2008  DATE OF DISCHARGE:                              HISTORY & PHYSICAL   PRIMARY MEDICAL DOCTOR:  Unassigned.   CHIEF COMPLAINT:  Worsening dyspnea, wheezing, productive cough.   HISTORY OF PRESENT ILLNESS:  Ms. Sheriff is a pleasant 50 year old  Caucasian female patient with no significant past medical history. She  indicates that for almost a year she has been having progressively  worsening dyspnea, especially on exertion, but sometimes also at rest  associated with wheezing. She indicates that walking from the hospital  to the hospital car park amount of distance would bring on dyspnea.  However, she has not sought any medical attention. According to her  boyfriend who is at the beside every year during the spring season, the  patient has flare up of her dyspnea secondary to allergies. In any  event, since four days ago, the patient has noted productive cough of  brownish yellow sputum with worsening dyspnea, wheezing. She has used  Dynegy with little relief. She also uses NyQuil as needed.  Because of her worsening symptoms, the patient presented to the  emergency room for further evaluation. The patient indicates she feels  hot but has not checked her temperature. There are no chills or rigors.  The patient has not traveled anywhere long distance or outside the  country. She has not had any sickly contacts. She has a dog at home but  has had the dog for a long time.   PAST MEDICAL HISTORY:  ? asthma/bronchitis. She has had a flare up of  these episodes since her age of 23 years.   PAST SURGICAL HISTORY:  Cesarean section.   ALLERGIES:  No known drug allergies.   MEDICATIONS:  1. Primatene Mist  p.r.n.  2. NyQuil p.r.n.   FAMILY HISTORY:  The patient's maternal grandmother died at age of 73  years of a CVA recently. She also had hypertension and diabetes. The  patient's father died at age 72 from lung cancer.   SOCIAL HISTORY:  The patient is single. She is a Copy. She used to smoke 1-1/2 packs of cigarettes per day since age 49  to a year ago. Since a year, she has reduced her smoking to 10  cigarettes per day. She also drinks anywhere from 2 to 6 beers daily.  She does not use hard liquor. There is no history of drug abuse.   REVIEW OF SYSTEMS:  Fourteen systems reviewed and apart from history of  present illness is pertinent for intermittent right upper quadrant pain  which is not related to p.o. intake.   PHYSICAL EXAMINATION:  Ms. Groseclose is a moderately built and thinly  nourished female who is in no obvious distress.  VITAL SIGNS:  Temperature 99.4 degrees Fahrenheit, blood pressure 140/98  mmHg,  pulse 112 per minute - regular, respirations 16 per minute,  saturating at 93% on room air.  HEENT:  Is normocephalic, atraumatic. Pupils equally reacting to light  and accommodation. Oral cavity with no oropharyngeal erythema or thrush.  NECK:  Supple. No JVD or carotid bruit.  LYMPHATICS:  No lymphadenopathy.  BREAST EXAM:  Deferred.  RESPIRATORY SYSTEM:  With decreased breath sounds bilaterally which  sound distant. There are bilateral scattered rhonchi, especially  anteriorly. No accessory muscles are active.  CARDIOVASCULAR SYSTEM:  First and second heart sounds heard. No third or  fourth heart sounds or murmurs.  ABDOMEN:  Is nondistended, nontender. No organomegaly or masses  appreciated. Bowel sounds are normally heard.  CENTRAL NERVOUS SYSTEM:  The patient is awake, alert, oriented x3. No  cranial nerve deficits.  EXTREMITIES:  With no clubbing, cyanosis, or edema. Peripheral pulses  are symmetrically felt.  MUSCULOSKELETAL SYSTEM:   Noncontributory.   LABORATORY DATA:  Comprehensive metabolic panel with sodium 132, BUN 5,  creatinine 0.71, and total bilirubin 1.3. AST 59, ALT 47. The rest of it  is unremarkable. CBC is with hemoglobin 16.6, hematocrit 49, white blood  cells 7.5, platelets 233. Chest x-ray has been reviewed by me and  reported as no active disease. Mild hyperinflation noted.   ASSESSMENT AND PLAN:  1. Acute infective exacerbation of chronic obstructive pulmonary      disease. Will admit patient to telemetry. Will provide oxygen,      bronchodilator nebulization (Xopenex secondary to tachycardia).      Will place on IV Solu-Medrol taper. Will continue empiric      antibiotics of IV ceftriaxone. Antibiotic therapy for 3 to 7 days.      Will check BNP, echocardiogram, given her dyspnea on exertion for      approximately a year, to rule out cardiac etiology. The patient may      need PFTs to characterize her lung disease. May also need      maintenance Advair.  2. Tobacco abuse. For counseling and nicotine patch.  3. Intermittent right upper quadrant pain with elevated AST/ALT. This      could be secondary to alcoholic hepatitis. However, will rule out      gallstones by obtaining a right upper quadrant ultrasound. Will      also follow LFTs.  4. Alcohol use. Will monitor for delirium tremens. Will provide      thiamine and multivitamins.  5. Hyponatremia. Unclear etiology. Will monitor BMET.  6. Deep vein thrombosis and gastrointestinal prophylaxis.      Marcellus Scott, MD  Electronically Signed     AH/MEDQ  D:  02/04/2008  T:  02/04/2008  Job:  [id number]

## 2011-01-30 NOTE — Discharge Summary (Signed)
NAME:  Wendy [name], Wendy [name]              ACCOUNT NO.:  [id number]   MEDICAL RECORD NO.:  [id number]          PATIENT TYPE:  INP   LOCATION:  5511                         FACILITY:  [facility]   PHYSICIAN:  Marcellus Scott, MD     DATE OF BIRTH:  1961-08-15   DATE OF ADMISSION:  02/04/2008  DATE OF DISCHARGE:  02/09/2008                               DISCHARGE SUMMARY   PRIMARY CARE PHYSICIAN:  Unassigned.   DISCHARGE DIAGNOSES:  1. Acute exacerbation of chronic obstructive pulmonary disease.  2. Tobacco abuse.  3. Alcoholism.  4. Low TSH, low free T3, but normal free T4.  5. Right upper quadrant pain, resolved.  6. Hyponatremia, resolved.   DISCHARGE MEDICATIONS:  1. Thiamine 100 mg p.o. daily.  2. Multivitamins 1 p.o. daily.  3. Nicotine 14 mg per 24-hour patch, 1 daily for 5 weeks and then      taper.  4. Mucinex 600 mg p.o. twice daily.  5. Prednisone oral taper per directions.  6. Albuterol 90 mcg per spray MDI, 2 puffs inhaled q.4-6h. p.r.n.  7. Spiriva 1 capsule inhaled daily.  8. Atrovent 17 mcg per spray MDI, 2 puff inhaled q.i.d. p.r.n.   PROCEDURES:  1. Ultrasound of the abdomen.  Impression:  The gallbladder is small      with thickening of the gallbladder wall and appears contracted.  No      gallstones can be identified.  No sonographic Murphy's sign is      present.  The remainder of the examination appears normal.  2. Chest x-ray.  Impression:  No active disease.  Mild hyperinflation      noted.  3. 2-D echocardiogram.  Impression:  Overall left ventricular systolic      function was normal.  Left ventricular ejection fraction was      estimated to range between 60-65%.  There were no left ventricular      regional wall motion abnormalities.   PERTINENT LABORATORY DATA:  Alpha-1 antitrypsin 170.  D-dimer 0.68.  Hemoglobin A1c 6.1.  Free T3 2.2, free T4 1.10.  ABG with pH 7.4, PCO2  43, bicarb 27.  TSH 0.010.  Blood cultures x2 with no growth to date.  Lipid panel  within normal limits.  Hepatic panel on Feb 05, 2008  unremarkable, except for albumin of 3.4.  CBC with hemoglobin 14.7,  hematocrit 42.2, white blood cells 6.3, platelets 215.  Influenza A and  B antigen negative.  BNP 99.   CONSULTATIONS:  Kalman Shan, M.D., pulmonary.   HOSPITAL COURSE AND PATIENT DISPOSITION:  Please refer to the history  and physical note for initial admission details.  Wendy [name] is a  pleasant 50 year old female with no significant past medical history but  chronic smoker who has been having worsening dyspnea over the last year  but presented with worsening of these symptoms with productive cough  which was not relieved by home remedies.  Further evaluation in the  emergency room revealed the patient with low-grade temperature of 99.4  and findings suggestive of acute exacerbation of COPD.  The patient also  had mildly abnormal  LFTs with AST of 59 and ALT of 47.  She was then  admitted to the hospital for further evaluation and management.   Problem 1.  Acute infective exacerbation of COPD.  The patient was  admitted to telemetry bed.  She was placed on oxygen, bronchodilator  nebulizations, IV Solu-Medrol, Mucinex.  With these measures, the  patient made only slight improvements over the first 2 days following  which a pulmonary consult was obtained.  They, however, agreed and  continued the same management and subsequently changed her IV Solu-  Medrol to p.o. prednisone taper.  With these measures, the patient has  progressively done well, with improvement in her dyspnea and improvement  in her effort tolerance.  The patient is currently ambulating in the  hospital on room air with saturations greater than 90%.  Pulmonary has  followed her and indicate that she is stable for discharge.  They also  recommended Spiriva.  She is to follow up with Dr. Marchelle Gearing on March 09, 2008 at 1:50 p.m.  They will consider outpatient spirometry or pulmonary  function  tests.   Problem 2.  Tobacco abuse.  The patient is counseled and has been  provided with a nicotine patch.   Problem 3.  Alcoholism.  The patient changed her history regarding the  amount of alcohol she consumed.  In any event, 24 hours after admission,  the patient was slightly tremulous and anxious, suggesting some  withdrawal.  She was placed on a low dose Ativan p.r.n. with which she  has done quite well.  She has been counseled regarding cessation of  alcohol which she verbalizes understanding.   Problem 4.  Low TSH, low free T3, but normal free T4.  Question if this  is sick thyroid.  To follow up her TFTs in 4-6 weeks' time.   Problem 5.  Right upper quadrant pain intermittently.  Ultrasound of the  abdomen was done, with findings as above.  This has promptly resolved.  Question secondary to alcoholic hepatitis.  Consider further evaluation  as an outpatient as deemed necessary if she continues to complain of  pain.   Problem 6.  Hyponatremia.  Resolved.   The patient lacks insurance and does not have a primary medical doctor.  Will provide referral to [facility] and will provide 3-day supply of  medications through the hospital pharmacy.  She has also been advised to  seek help at [facility] for $4 prescriptions.      Marcellus Scott, MD  Electronically Signed     AH/MEDQ  D:  02/09/2008  T:  02/09/2008  Job:  [id number]   cc:   Kalman Shan, MD  [facility]

## 2011-01-30 NOTE — Consult Note (Signed)
NAME:  Wendy [name], Wendy [name]              ACCOUNT NO.:  [id number]   MEDICAL RECORD NO.:  [id number]          PATIENT TYPE:  INP   LOCATION:  5511                         FACILITY:  [facility]   PHYSICIAN:  Kalman Shan, MD   DATE OF BIRTH:  1961/06/10   DATE OF CONSULTATION:  02/06/2008  DATE OF DISCHARGE:                                 CONSULTATION   CONSULT REQUESTED BY:  Dr. Marcellus Scott, [facility] service.   TIME OF CONSULT:  5:00 p.m. to 6:00 p.m. Feb 06, 2008, 60-minute  inpatient consult.   REASON FOR CONSULTATION:  COPD exacerbation, failure to resolve.   HISTORY OF PRESENT ILLNESS:  Ms. Dettmann is a 50 year old white female  heavy smoker who has a history of asthma since age 39 who at baseline  has class III to IV dyspnea on exertion and significant orthopnea.  For  the past 5 days, she has had yellow sputum, increased chills, increased  sweats and also subjective fevers and worsening dyspnea to extend she  could not finish full sentences and presented to the emergency room on  Feb 04, 2008.  She is being treated for COPD exacerbation, but primary  team is concerned that she still has significant subjective wheezing and  dyspnea, and despite nebulizers, antibiotics and steroids, she has not  subjectively improved, and therefore, we have been consulted.   PAST MEDICAL HISTORY:  1. Multiple episodes of bronchitis.  In detailed review, I have      learned that she treats herself with Primatene Mist and never has      taken antibiotics or steroids or been to urgent care or M.D.      visits.  No prior ICU admissions, ventilator admits, ER visits, or      prednisone courses.  2. Asthma.  Chronic severe asthma for which she takes Primatene Mist      p.r.n. since childhood.  3. Seasonal allergies.  4. Poor dentition.   PAST SURGICAL HISTORY:  History of C-section.   ALLERGIES:  No known allergies.   MEDICATIONS:  Home medications:  Only Primatene Mist p.r.n., unclear  what  baseline use is.   Hospital medications include:  1. Thiamine.  2. Multivitamin.  3. Solu-Medrol 60 mg q.8h.  4. Nicotine patch.  5. Atrovent q.6h.  6. Guaifenesin.  7. Docusate.  8. Ambien 5 mg p.o. p.r.n.  9. Ativan p.o. p.r.n.  10.Ceftriaxone 1 g q.24h.  11.Zofran p.r.n.  12.Antacid p.r.n.  13.Atrovent q.2h. p.r.n.  14.Tylenol p.r.n.  15.Oxycodone p.r.n.  16.Xopenex q.6h. p.r.n.   FAMILY HISTORY:  Dad died of lung cancer.  She is unsure of anybody has  COPD in the family.   SOCIAL HISTORY:  Works at [employer].  She drinks 5-6 cans of beer daily.  One to two packs per day lifelong smoker. REcently trying to quit - has  cut down to 8 cigarettes a night.   REVIEW OF SYSTEMS:  As in history of present illness.  At baseline, she  is wheezing, significant orthopnea using many pillows.  She has dyspnea  on exertion to 200-250 feet.  Can climb half  flight of stairs but gets  dyspneic taking a shower.  Has occasional dry cough with scanty white  sputum.  No edema.   PHYSICAL EXAMINATION:  Thin, white female.  Does not look in distress.  VITAL SIGNS:  Temperature 98, pulse of 115, respiratory rate 20, blood  pressure 149/93, saturation 97% on 2 liters nasal cannula.  GENERAL EXAM:  Thin cachectic female.  HEENT:  Supple neck.  No neck nodes.  CENTRAL NERVOUS SYSTEM:  Alert and oriented x3.  Speech normal.  Moves  all fours, eating normally.  RESPIRATORY EXAM:  Trachea central, air entry equal, bilaterally  expiration is prolonged.  Scattered wheeze present.  Able to talk full  sentences although little bit haltingly.  ABDOMEN:  Soft, nontender.  EXTREMITIES:  No cyanosis, no clubbing or edema.   LABORATORY VALUES:  TSH 0.1.  BNP 99.   Two-D echocardiogram:  EF 60%.   Chest x-ray shows hyperinflation.   Laboratory:  Creatinine 0.57, bicarb was 27.   Hemoglobin 14.7, white count 6.3.   ASSESSMENT AND PLAN:  This is a young 50 year old woman with a history  of severe  asthma since childhood for which she just takes IT consultant p.r.n.  Also, greater than 60 pack smoking and at baseline has  chronic dyspnea on exertion class III, currently in acute exacerbation.  Clinically, definitely has chronic obstructive pulmonary disease and  symptoms consistent with chronic obstructive pulmonary disease  exacerbation.   There is no evidence of heart failure on this admission based on  echocardiogram findings and BNP findings.  There is no evidence of  pneumonia based on chest x-ray findings.  I suspect the reason for slow  failure to resolve from a subjective standpoint is that she has  underlying severe chronic obstructive pulmonary disease, and it is going  to take some time to resolve.  I also think that there is room for  optimization of her nebulizer rescue treatment to help improve.  Also  will have to rule out pulmonary embolism with the help of a D-dimer to  make sure there is no other cause for worsening exacerbation.   PLAN:  1. To this end, I would recommend increasing albuterol. Recommend      stopping Xopenex.  2. Increasing albuterol to q.6h. scheduled and q.3h. p.r.n.  Continue      Atrovent.  3. Change Solu-Medrol to p.o. prednisone.  This taper has been written      for 2 weeks.   PROBLEM #2:  Chronic chronic obstructive pulmonary disease.  Given young  age, I would get alpha-1 antitrypsin levels checked.   Few days prior to discharge, she will need to be started on Spiriva and  also inhaled Symbicort or Advair.   Follow up With Dr. Marchelle Gearing on March 09, 2008 at 1:50 p.m. as already  fixed.   Will need outpatient pulmonary rehab.   Will need Pneumovax prior to discharge and every year needs flu shot.   PROBLEM #3:  Smoking.  She definitely needs to quit smoking in order to  reduce risk of further deterioration in lung function and reduce risk of  exacerbations. She says she has already cut down to 8 cigarettes a  night. I offered  to discuss more in the office as outpatient. Explained  all bad consequences of smoking.   Thank you for this interesting consult.  Will continue to follow.      Kalman Shan, MD  Electronically Signed     MR/MEDQ  D:  02/06/2008  T:  02/06/2008  Job:  [id number]

## 2011-02-05 ENCOUNTER — Ambulatory Visit: Payer: Medicaid Other | Admitting: Internal Medicine

## 2011-02-27 ENCOUNTER — Other Ambulatory Visit: Payer: Self-pay | Admitting: Internal Medicine

## 2011-03-14 ENCOUNTER — Encounter: Payer: Self-pay | Admitting: Internal Medicine

## 2011-03-15 ENCOUNTER — Ambulatory Visit (INDEPENDENT_AMBULATORY_CARE_PROVIDER_SITE_OTHER): Payer: Medicaid Other | Admitting: Internal Medicine

## 2011-03-15 ENCOUNTER — Encounter: Payer: Self-pay | Admitting: Internal Medicine

## 2011-03-15 VITALS — BP 138/82 | HR 97 | Ht 61.0 in | Wt 122.0 lb

## 2011-03-15 DIAGNOSIS — J310 Chronic rhinitis: Secondary | ICD-10-CM

## 2011-03-15 DIAGNOSIS — J449 Chronic obstructive pulmonary disease, unspecified: Secondary | ICD-10-CM

## 2011-03-15 DIAGNOSIS — F17200 Nicotine dependence, unspecified, uncomplicated: Secondary | ICD-10-CM

## 2011-03-15 MED ORDER — AZITHROMYCIN 250 MG PO TABS: ORAL_TABLET | ORAL | Status: AC

## 2011-03-15 MED ORDER — PREDNISONE (PAK) 10 MG PO TABS: ORAL_TABLET | ORAL | Status: DC

## 2011-03-15 NOTE — Assessment & Plan Note (Signed)
Acute exacerbation of COPD - probably viral syndrome with URI and bronchitis We will give prednisone taper and Z pak, encourage fluid and supportive care.

## 2011-03-15 NOTE — Progress Notes (Signed)
  Subjective:    Patient ID: Wendy [name], female    DOB: June 12, 1961, 50 y.o.   MRN: [id number]  HPI 03/15/11- 46 yoF smoker followed for COPD, tobacco cessation support, rhinitis, complicated by hx anxiety Last here November 06, 2010- note reviewed. She has not smoked since December 2011.  Using her oxygen for the past week since she caught a cold with head and chest congestion, purulent sputum, hoarse. No fever or sore throat. Using her regular meds. Using her nebulizer regularly.  Had 7 teeth pulled in May.  Review of Systems  per HPI Constitutional:   No weight loss, night sweats,  Fevers, chills, fatigue, lassitude. HEENT:   No headaches,  Difficulty swallowing,  Tooth/dental problems,  Sore throat,                No sneezing, itching, ear ache, nasal congestion, post nasal drip,   CV:  No chest pain,  Orthopnea, PND, swelling in lower extremities, anasarca, dizziness, palpitations  GI  No heartburn, indigestion, abdominal pain, nausea, vomiting, diarrhea, change in bowel habits, loss of appetite  Resp:Per HPI  Skin: no rash or lesions.  GU: no dysuria, change in color of urine, no urgency or frequency.  No flank pain.  MS:  No joint pain or swelling.  No decreased range of motion.  No back pain.  Psych:  No change in mood or affect. No depression or anxiety.  No memory loss.      Objective:   Physical Exam General- Alert, Oriented, Affect-appropriate, Distress- none acute  O2 on 2L/M demand regulator  Skin- rash-none, lesions- none, excoriation- none  Lymphadenopathy- none  Head- atraumatic  Eyes- Gross vision intact, PERRLA, conjunctivae clear secretions  Ears- Hearing, canals, Tm - normal  Nose- Edematous, stuffy, clear mucus, sniffing,            No- Septal dev,  polyps, erosion, perforation   Throat- Mallampati II , mucosa clear , drainage- none, tonsils- atrophic   Not red  Neck- flexible , trachea midline, no stridor , thyroid nl, carotid no  bruit  Chest - symmetrical excursion , unlabored     Heart/CV- RRR , no murmur , no gallop  , no rub, nl s1 s2                     - JVD- none , edema- none, stasis changes- none, varices- none     Lung- Wheeze, mild rhonchi and mild cough ,                       dullness-none, rub- none     Chest wall-   Abd- tender-no, distended-no, bowel sounds-present, HSM- no  Br/ Gen/ Rectal- Not done, not indicated  Extrem- cyanosis- none, clubbing, none, atrophy- none, strength- nl  Neuro- grossly intact to observation          Assessment & Plan:

## 2011-03-15 NOTE — Assessment & Plan Note (Signed)
So far, so good. She is congratulated and supported for ongoing abstinence

## 2011-03-15 NOTE — Patient Instructions (Signed)
Scripts sent for Z pak and prednisone  Get enough fluids and rest  Mucinex may help thin mucus

## 2011-03-18 ENCOUNTER — Encounter: Payer: Self-pay | Admitting: Internal Medicine

## 2011-03-18 NOTE — Assessment & Plan Note (Signed)
Recent nasal congestion. Meds discussed.

## 2011-03-26 ENCOUNTER — Telehealth: Payer: Self-pay | Admitting: Internal Medicine

## 2011-03-26 MED ORDER — CLARITHROMYCIN 500 MG PO TABS: 500.0000 mg | ORAL_TABLET | Freq: Two times a day (BID) | ORAL | Status: AC

## 2011-03-26 NOTE — Telephone Encounter (Signed)
rx sent. Pt aware.Jennifer Castillo, CMA  

## 2011-03-26 NOTE — Telephone Encounter (Signed)
Spoke with the pt and she states that she was seen on 03-15-11 and given a zpak and pred taper. She states she still has a productive cough, but the amount of phlegm is much less and the color is clear, but she is still coughing. She also states that her increased SOB has not improved and she still  Has chest congestion. The head congestion has cleard adn she is no longer hoarse. Pt feels she is not back to "normal." Please advise.Carron Curie, CMA Allergies  Allergen Reactions  . Fexofenadine     REACTION: nausea  . Other     PAIN MEDICATIONS-nausea

## 2011-03-26 NOTE — Telephone Encounter (Signed)
Per CY-try Biaxin 500 mg # 209 take 1 po after meals bid (related to Zpak but a little stronger) no refills.

## 2011-04-01 ENCOUNTER — Other Ambulatory Visit: Payer: Self-pay | Admitting: Internal Medicine

## 2011-04-03 ENCOUNTER — Ambulatory Visit (INDEPENDENT_AMBULATORY_CARE_PROVIDER_SITE_OTHER): Payer: Medicaid Other

## 2011-04-03 ENCOUNTER — Inpatient Hospital Stay (INDEPENDENT_AMBULATORY_CARE_PROVIDER_SITE_OTHER)
Admission: RE | Admit: 2011-04-03 | Discharge: 2011-04-03 | Disposition: A | Discharge status: Home / Self Care | Payer: Medicaid Other | Source: Ambulatory Visit | Attending: Family Medicine | Admitting: Family Medicine

## 2011-04-03 DIAGNOSIS — S52599A Other fractures of lower end of unspecified radius, initial encounter for closed fracture: Secondary | ICD-10-CM

## 2011-04-03 DIAGNOSIS — S52609B Unspecified fracture of lower end of unspecified ulna, initial encounter for open fracture type I or II: Secondary | ICD-10-CM

## 2011-04-30 ENCOUNTER — Telehealth: Payer: Self-pay | Admitting: Internal Medicine

## 2011-04-30 MED ORDER — FLUTICASONE-SALMETEROL 500-50 MCG/DOSE IN AEPB: 1.0000 | INHALATION_SPRAY | Freq: Two times a day (BID) | RESPIRATORY_TRACT | Status: DC

## 2011-04-30 MED ORDER — ALPRAZOLAM 1 MG PO TABS: 1.0000 mg | ORAL_TABLET | Freq: Three times a day (TID) | ORAL | Status: DC | PRN

## 2011-04-30 NOTE — Telephone Encounter (Signed)
Refill sent pt aware. Jennifer Castillo, CMA  

## 2011-06-01 ENCOUNTER — Telehealth: Payer: Self-pay | Admitting: Internal Medicine

## 2011-06-01 NOTE — Telephone Encounter (Signed)
Per CDY okay to refill x 5  [facility] and [facility] is aware of this. [facility] states [facility] no refills on her advair. [facility] and advised them okay x 6. Nothing further was needed

## 2011-06-01 NOTE — Telephone Encounter (Signed)
Pt requesting refills on Advair and Xanax.  Last given rx for Advair was on 04/30/11 for # 60 x 6 refills so therefore no refills needed on this.  However, last rx given for xanax was 04/30/2011 for # 90 x 0 refills.  CY, please advise if ok to send refills on xanax?  Thanks Allergies  Allergen Reactions  . Fexofenadine     REACTION: nausea  . Other     PAIN MEDICATIONS-nausea

## 2011-06-06 ENCOUNTER — Ambulatory Visit (INDEPENDENT_AMBULATORY_CARE_PROVIDER_SITE_OTHER): Payer: Medicaid Other

## 2011-06-06 DIAGNOSIS — Z23 Encounter for immunization: Secondary | ICD-10-CM

## 2011-06-07 DIAGNOSIS — Z23 Encounter for immunization: Secondary | ICD-10-CM

## 2011-06-13 LAB — BLOOD GAS, ARTERIAL
Acid-Base Excess: 2.7 — ABNORMAL HIGH
Drawn by: 279341
pCO2 arterial: 43.5
pH, Arterial: 7.41 — ABNORMAL HIGH
pO2, Arterial: 72.1 — ABNORMAL LOW

## 2011-06-13 LAB — COMPREHENSIVE METABOLIC PANEL
AST: 32
Alkaline Phosphatase: 77
Alkaline Phosphatase: 92
BUN: 2 — ABNORMAL LOW
BUN: 5 — ABNORMAL LOW
CO2: 27
Chloride: 103
Chloride: 97
Creatinine, Ser: 0.57
GFR calc non Af Amer: >60
Glucose, Bld: 113 — ABNORMAL HIGH
Potassium: 3.9
Potassium: 4.6
Total Bilirubin: 0.5
Total Bilirubin: 1.3 — ABNORMAL HIGH
Total Protein: 7.6

## 2011-06-13 LAB — DIFFERENTIAL
Basophils Absolute: 0
Basophils Relative: 0
Neutro Abs: 5.2
Neutrophils Relative %: 69

## 2011-06-13 LAB — CBC
HCT: 42.2
HCT: 49 — ABNORMAL HIGH
Hemoglobin: 14.7
Hemoglobin: 16.6 — ABNORMAL HIGH
MCV: 90.3
RBC: 4.68
RDW: 13.3
WBC: 6.3

## 2011-06-13 LAB — HEMOGLOBIN A1C
Hgb A1c MFr Bld: 6.1
Mean Plasma Glucose: 140

## 2011-06-13 LAB — LIPID PANEL
Cholesterol: 106
LDL Cholesterol: 19
Total CHOL/HDL Ratio: 1.3
VLDL: 4

## 2011-06-13 LAB — CULTURE, BLOOD (ROUTINE X 2): Culture: NO GROWTH

## 2011-06-13 LAB — D-DIMER, QUANTITATIVE: D-Dimer, Quant: 0.68 — ABNORMAL HIGH

## 2011-06-13 LAB — INFLUENZA A+B VIRUS AG-DIRECT(RAPID): Inflenza A Ag: NEGATIVE

## 2011-06-13 LAB — T3, FREE: T3, Free: 2.2 — ABNORMAL LOW (ref 2.3–4.2)

## 2011-07-09 ENCOUNTER — Telehealth: Payer: Self-pay | Admitting: Internal Medicine

## 2011-07-09 MED ORDER — AZITHROMYCIN 250 MG PO TABS: ORAL_TABLET | ORAL | Status: AC

## 2011-07-09 NOTE — Telephone Encounter (Signed)
Called and spoke with pt. Pt aware rx sent to pharmacy.   

## 2011-07-09 NOTE — Telephone Encounter (Signed)
Called and spoke with pt.  Pt states symptoms started 2 days ago.  C/o " chest cold."  Coughing up thick gray colored sputum, sore throat, head congestion and tightness/heaviness in chest.  States it feels like "elephant is sitting on chest."  Denies f/c/s.  Pt is taking Mucinex D and also using Neti pot with no relief of symptoms.  Cy, please advise.  Thanks. Allergies  Allergen Reactions  . Fexofenadine     REACTION: nausea  . Other     PAIN MEDICATIONS-nausea

## 2011-07-09 NOTE — Telephone Encounter (Signed)
Per CY okay to give Zpak #1 take as directed no refills. 

## 2011-07-15 ENCOUNTER — Inpatient Hospital Stay (INDEPENDENT_AMBULATORY_CARE_PROVIDER_SITE_OTHER)
Admission: RE | Admit: 2011-07-15 | Discharge: 2011-07-15 | Disposition: A | Discharge status: Home / Self Care | Payer: Medicaid Other | Source: Ambulatory Visit | Attending: Family Medicine | Admitting: Family Medicine

## 2011-07-15 ENCOUNTER — Emergency Department (HOSPITAL_COMMUNITY)
Admission: EM | Admit: 2011-07-15 | Discharge: 2011-07-15 | Disposition: A | Discharge status: Home / Self Care | Payer: Medicaid Other | Attending: Emergency Medicine | Admitting: Emergency Medicine

## 2011-07-15 ENCOUNTER — Ambulatory Visit (INDEPENDENT_AMBULATORY_CARE_PROVIDER_SITE_OTHER): Payer: Medicaid Other

## 2011-07-15 ENCOUNTER — Emergency Department (HOSPITAL_COMMUNITY): Payer: Medicaid Other

## 2011-07-15 DIAGNOSIS — R062 Wheezing: Secondary | ICD-10-CM | POA: Insufficient documentation

## 2011-07-15 DIAGNOSIS — J4489 Other specified chronic obstructive pulmonary disease: Secondary | ICD-10-CM | POA: Insufficient documentation

## 2011-07-15 DIAGNOSIS — R0602 Shortness of breath: Secondary | ICD-10-CM | POA: Insufficient documentation

## 2011-07-15 DIAGNOSIS — J449 Chronic obstructive pulmonary disease, unspecified: Secondary | ICD-10-CM

## 2011-07-15 DIAGNOSIS — R0989 Other specified symptoms and signs involving the circulatory and respiratory systems: Secondary | ICD-10-CM | POA: Insufficient documentation

## 2011-07-15 DIAGNOSIS — J45909 Unspecified asthma, uncomplicated: Secondary | ICD-10-CM

## 2011-07-15 DIAGNOSIS — R05 Cough: Secondary | ICD-10-CM | POA: Insufficient documentation

## 2011-07-15 DIAGNOSIS — Z79899 Other long term (current) drug therapy: Secondary | ICD-10-CM | POA: Insufficient documentation

## 2011-07-15 DIAGNOSIS — R059 Cough, unspecified: Secondary | ICD-10-CM | POA: Insufficient documentation

## 2011-07-15 DIAGNOSIS — R0609 Other forms of dyspnea: Secondary | ICD-10-CM | POA: Insufficient documentation

## 2011-07-15 LAB — BASIC METABOLIC PANEL
BUN: 6 mg/dL (ref 6–23)
CO2: 29 mEq/L (ref 19–32)
Calcium: 9.8 mg/dL (ref 8.4–10.5)
Chloride: 93 mEq/L — ABNORMAL LOW (ref 96–112)
Creatinine, Ser: 0.48 mg/dL — ABNORMAL LOW (ref 0.50–1.10)
Glucose, Bld: 104 mg/dL — ABNORMAL HIGH (ref 70–99)

## 2011-07-15 LAB — DIFFERENTIAL
Basophils Relative: 1 % (ref 0–1)
Eosinophils Absolute: 0.1 10*3/uL (ref 0.0–0.7)
Eosinophils Relative: 1 % (ref 0–5)
Lymphs Abs: 1.1 10*3/uL (ref 0.7–4.0)
Monocytes Absolute: 0.6 10*3/uL (ref 0.1–1.0)
Monocytes Relative: 11 % (ref 3–12)

## 2011-07-15 LAB — CBC
MCH: 32.8 pg (ref 26.0–34.0)
MCHC: 34.3 g/dL (ref 30.0–36.0)
MCV: 95.8 fL (ref 78.0–100.0)
Platelets: 189 10*3/uL (ref 150–400)
RBC: 4.51 MIL/uL (ref 3.87–5.11)
RDW: 11.6 % (ref 11.5–15.5)

## 2011-07-17 ENCOUNTER — Telehealth: Payer: Self-pay | Admitting: Internal Medicine

## 2011-07-17 NOTE — Telephone Encounter (Signed)
Per Cy-okay to give next OV slot open. Pt is aware and made appt for 08-16-11 at 1045am and understands to call if no better before then.

## 2011-07-17 NOTE — Telephone Encounter (Signed)
Spoke with pt. She states that she had to go to ED over 07/15/11 due to her cough and congestion being no better after finishing the zpack that we sent in. She states was given rx for pred taper, neb tx and was sent home. She states needs followup with CDY in at least 1 wk. Her cough is some better, now with white to clear sputum. No appts available this soon with CDY. Please advise if okay to get her worked in sooner thanks!

## 2011-08-16 ENCOUNTER — Encounter: Payer: Self-pay | Admitting: Internal Medicine

## 2011-08-16 ENCOUNTER — Ambulatory Visit (INDEPENDENT_AMBULATORY_CARE_PROVIDER_SITE_OTHER): Payer: Medicaid Other | Admitting: Internal Medicine

## 2011-08-16 VITALS — BP 122/76 | HR 116 | Ht 61.0 in | Wt 117.2 lb

## 2011-08-16 DIAGNOSIS — R21 Rash and other nonspecific skin eruption: Secondary | ICD-10-CM

## 2011-08-16 DIAGNOSIS — J449 Chronic obstructive pulmonary disease, unspecified: Secondary | ICD-10-CM

## 2011-08-16 DIAGNOSIS — F17200 Nicotine dependence, unspecified, uncomplicated: Secondary | ICD-10-CM

## 2011-08-16 DIAGNOSIS — J310 Chronic rhinitis: Secondary | ICD-10-CM

## 2011-08-16 MED ORDER — PREDNISONE 5 MG PO TABS: ORAL_TABLET | ORAL | Status: AC

## 2011-08-16 NOTE — Patient Instructions (Addendum)
Please call as needed  Try saline nasal gel and Breath Right nasal strips for you stuffy nose and nose bleeds   Prednisone 5 mg, # 60     4 x 7 days, 2 x 7 days, 1 x 7 days, then stop  Try a simple moisturizing lotion like Eucerin on your legs

## 2011-08-16 NOTE — Progress Notes (Signed)
Patient ID: Wendy [name], female    DOB: 1961-05-05, 50 y.o.   MRN: [id number] HPI 03/15/11- 57 yoF smoker followed for COPD, tobacco cessation support, rhinitis, complicated by hx anxiety Last here November 06, 2010- note reviewed. She has not smoked since December 2011.  Using her oxygen for the past week since she caught a cold with head and chest congestion, purulent sputum, hoarse. No fever or sore throat. Using her regular meds. Using her nebulizer regularly.  Had 7 teeth pulled in May.   08/16/11- 50 yoF smoker followed for COPD, tobacco cessation support, rhinitis, complicated by hx anxiety Has had flu vaccine. October 28 she went to [facility] with acute bronchitis after catching a cold. Got better after treatment there but not well. We called in prednisone and a Z-Pak. She still feels some chest congestion, cough with clear mucus, feels tight. Denies fever, chest pain or blood. Has had oxygen for several months, used most of the time. Says she quit cigarettes this year by using an electronic cigarette with a mild insert. Nasal congestion especially left side. Occasional blood from left nostril. Chest x-ray: 07/15/2011-hyperinflation, no acute disease. I reviewed images.  Review of Systems  per HPI Constitutional:   No-   weight loss, night sweats, fevers, chills, fatigue, lassitude. HEENT:   No-  headaches, difficulty swallowing, tooth/dental problems, sore throat,       No-  sneezing, itching, ear ache, +nasal congestion, post nasal drip,  CV:  No-   chest pain, orthopnea, PND, swelling in lower extremities, anasarca,                                  dizziness, palpitations Resp: Persistent shortness of breath with exertion or at rest.              + productive cough,  No non-productive cough,  No- coughing up of blood.              No-   change in color of mucus.  No- wheezing.   Skin: No-   rash or lesions. GI:  No-   heartburn, indigestion, abdominal pain, nausea,  vomiting, diarrhea,                 change in bowel habits, loss of appetite GU: No-   dysuria, change in color of urine, no urgency or frequency.  No- flank pain. MS:  No-   joint pain or swelling.  No- decreased range of motion.  No- back pain. Neuro-     nothing unusual Psych:  No- change in mood or affect. No depression or anxiety.  No memory loss.   Objective:   Physical Exam General- Alert, Oriented, Affect-appropriate, Distress- none acute Skin- scaling rash lower legs in patches, lesions- none, excoriation- none. Fungal type nail changes. Lymphadenopathy- none Head- atraumatic            Eyes- Gross vision intact, PERRLA, conjunctivae clear secretions            Ears- Hearing, canals-normal            Nose- turbinate edema, no-Septal dev, mucus, polyps, erosion, perforation             Throat- Mallampati II , mucosa clear , drainage- none, tonsils- atrophic Neck- flexible , trachea midline, no stridor , thyroid nl, carotid no bruit Chest - symmetrical excursion , unlabored  Heart/CV- RRR , no murmur , no gallop  , no rub, nl s1 s2                           - JVD- none , edema- none, stasis changes- none, varices- none           Lung- very distant, wheeze- none, dry cough with deep breath , dullness-none, rub- none           Chest wall-  Abd- tender-no, distended-no, bowel sounds-present, HSM- no Br/ Gen/ Rectal- Not done, not indicated Extrem- cyanosis- none, clubbing, none, atrophy- none, strength- nl Neuro- grossly intact to observation

## 2011-08-18 DIAGNOSIS — L409 Psoriasis, unspecified: Secondary | ICD-10-CM | POA: Insufficient documentation

## 2011-08-18 NOTE — Assessment & Plan Note (Signed)
She continues oxygen at home. Slow prednisone taper over several weeks.

## 2011-08-18 NOTE — Assessment & Plan Note (Signed)
She is encouraged to stay off completely.

## 2011-08-18 NOTE — Assessment & Plan Note (Signed)
Lesions on legs suggest psoriatic patches or eczema. Question if the chronic fingernail changes are from a psoriatic process as well. She is going to try a simple moisturizing skin lotion. If that is not sufficient we will try to get her to a dermatologist.

## 2011-08-18 NOTE — Assessment & Plan Note (Signed)
Continue Neti Pot, add saline gel and nasal strips as needed.

## 2011-09-12 ENCOUNTER — Telehealth: Payer: Self-pay | Admitting: Internal Medicine

## 2011-09-12 MED ORDER — TIOTROPIUM BROMIDE MONOHYDRATE 18 MCG IN CAPS: 18.0000 ug | ORAL_CAPSULE | Freq: Every day | RESPIRATORY_TRACT | Status: DC

## 2011-09-12 NOTE — Telephone Encounter (Signed)
Contacted patient, refill sent in to pt's pharmacy and patient aware.

## 2011-09-13 ENCOUNTER — Ambulatory Visit: Payer: Medicaid Other | Admitting: Internal Medicine

## 2011-11-16 ENCOUNTER — Telehealth: Payer: Self-pay | Admitting: Internal Medicine

## 2011-11-16 MED ORDER — AZITHROMYCIN 250 MG PO TABS: ORAL_TABLET | ORAL | Status: AC

## 2011-11-16 NOTE — Telephone Encounter (Signed)
Called, spoke with pt.  She c/o chest congestion, prod cough, increased SOB, chills and sweats, and chest tightness x 3-4 days.  Mucus is yellow to green to gray.  Denies wheezing.  Requesting something to be called in.  Dr. Maple Hudson, pls advise.  Thanks!  [facility] Allergies Verified Allergies  Allergen Reactions  . Fexofenadine     REACTION: nausea  . Other     PAIN MEDICATIONS-nausea

## 2011-11-16 NOTE — Telephone Encounter (Signed)
Per CY-okay to give Zpak #1 take as directed no refills. Pt is aware of RX sent.

## 2011-11-27 ENCOUNTER — Telehealth: Payer: Self-pay | Admitting: Internal Medicine

## 2011-11-27 NOTE — Telephone Encounter (Signed)
Spoke with pt. She states needs refill on alprazolam 1 mg # 90 1 tidprn. She states that [facility] always refills this for her. Please advise thanks!

## 2011-11-28 MED ORDER — ALPRAZOLAM 1 MG PO TABS: 1.0000 mg | ORAL_TABLET | Freq: Three times a day (TID) | ORAL | Status: DC | PRN

## 2011-11-28 NOTE — Telephone Encounter (Signed)
Rx refill called in for pt. Spoke with pt and notified of this and she verbalized understanding and states nothing further needed.

## 2011-11-28 NOTE — Telephone Encounter (Signed)
Ok top refill 

## 2011-12-06 ENCOUNTER — Other Ambulatory Visit: Payer: Self-pay | Admitting: Internal Medicine

## 2011-12-26 ENCOUNTER — Other Ambulatory Visit: Payer: Self-pay | Admitting: Internal Medicine

## 2012-01-07 ENCOUNTER — Telehealth: Payer: Self-pay | Admitting: Internal Medicine

## 2012-01-07 MED ORDER — PREDNISONE 10 MG PO TABS: ORAL_TABLET | ORAL | Status: DC

## 2012-01-07 NOTE — Telephone Encounter (Signed)
Per CY  prednisone 10 mg  4 tablets x 2 days 3 tablets x 2  2 tablets x2 1 tablet x 2 days Then stop rx sent to pharmacy and pt aware

## 2012-01-07 NOTE — Telephone Encounter (Signed)
Called, spoke with pt who c/o chest congestion with a lot of gray phelgm, increased SOB, wheezing, "feeling feverish" at times, and feels like there is fluid on lungs x 2 days.  States she is using albuterol neb and albuterol hfa with some relief.  Pt states prednisone usually works well with these symptoms.  Dr. Maple Hudson, pls advise.  Thank you.  [facility] [address]  Allergies verified  Allergies  Allergen Reactions  . Fexofenadine     REACTION: nausea  . Other     PAIN MEDICATIONS-nausea

## 2012-01-07 NOTE — Telephone Encounter (Signed)
Pt called back & would like to know if anything is going to be called into her pharmacy.  Antionette Fairy'

## 2012-01-25 ENCOUNTER — Telehealth: Payer: Self-pay | Admitting: Internal Medicine

## 2012-01-25 MED ORDER — ALPRAZOLAM 1 MG PO TABS: 1.0000 mg | ORAL_TABLET | Freq: Three times a day (TID) | ORAL | Status: DC | PRN

## 2012-01-25 NOTE — Telephone Encounter (Signed)
Called and spoke with pt and she is aware of the med that has been called to her pharmacy.  Nothing further was needed.

## 2012-01-25 NOTE — Telephone Encounter (Signed)
Per CY-okay to give 1 month supply no refills.

## 2012-01-25 NOTE — Telephone Encounter (Signed)
Pt is requesting a refill of the alprazolam 1 mg  1 po tid   #90.  This was given to the pt in 11/2011--her last ov with CY was 07/2011 and next ov is 02/14/2012.  CY please advise if ok to send in a refill for this pt.  Thanks  Allergies  Allergen Reactions  . Fexofenadine     REACTION: nausea  . Other     PAIN MEDICATIONS-nausea

## 2012-02-14 ENCOUNTER — Ambulatory Visit: Payer: Medicaid Other | Admitting: Internal Medicine

## 2012-02-25 ENCOUNTER — Ambulatory Visit (INDEPENDENT_AMBULATORY_CARE_PROVIDER_SITE_OTHER): Payer: Medicaid Other | Admitting: Internal Medicine

## 2012-02-25 ENCOUNTER — Encounter: Payer: Self-pay | Admitting: Internal Medicine

## 2012-02-25 VITALS — BP 142/88 | HR 108 | Ht 61.0 in | Wt 118.8 lb

## 2012-02-25 DIAGNOSIS — J4489 Other specified chronic obstructive pulmonary disease: Secondary | ICD-10-CM

## 2012-02-25 DIAGNOSIS — F17200 Nicotine dependence, unspecified, uncomplicated: Secondary | ICD-10-CM

## 2012-02-25 DIAGNOSIS — J449 Chronic obstructive pulmonary disease, unspecified: Secondary | ICD-10-CM

## 2012-02-25 MED ORDER — ALPRAZOLAM 1 MG PO TABS: 1.0000 mg | ORAL_TABLET | Freq: Three times a day (TID) | ORAL | Status: DC | PRN

## 2012-02-25 MED ORDER — TIOTROPIUM BROMIDE MONOHYDRATE 18 MCG IN CAPS: 18.0000 ug | ORAL_CAPSULE | Freq: Every day | RESPIRATORY_TRACT | Status: DC

## 2012-02-25 MED ORDER — LEVALBUTEROL HCL 0.63 MG/3ML IN NEBU
0.6300 mg | INHALATION_SOLUTION | Freq: Once | RESPIRATORY_TRACT | Status: AC
  Administered 2012-02-25: 0.63 mg via RESPIRATORY_TRACT

## 2012-02-25 MED ORDER — PREDNISONE 10 MG PO TABS: ORAL_TABLET | ORAL | Status: DC

## 2012-02-25 NOTE — Patient Instructions (Addendum)
Neb xop 0.63  Script for prednisone taper  Scripts refilling xanax and spiriva  Sample rescue inhaler if available

## 2012-02-25 NOTE — Progress Notes (Signed)
Patient ID: Wendy [name], female    DOB: 1961-07-12, 51 y.o.   MRN: [id number] HPI 03/15/11- 46 yoF smoker followed for COPD, tobacco cessation support, rhinitis, complicated by hx anxiety Last here November 06, 2010- note reviewed. She has not smoked since December 2011.  Using her oxygen for the past week since she caught a cold with head and chest congestion, purulent sputum, hoarse. No fever or sore throat. Using her regular meds. Using her nebulizer regularly.  Had 7 teeth pulled in May.   08/16/11- 50 yoF smoker followed for COPD, tobacco cessation support, rhinitis, complicated by hx anxiety Has had flu vaccine. October 28 she went to [facility] with acute bronchitis after catching a cold. Got better after treatment there but not well. We called in prednisone and a Z-Pak. She still feels some chest congestion, cough with clear mucus, feels tight. Denies fever, chest pain or blood. Has had oxygen for several months, used most of the time. Says she quit cigarettes this year by using an electronic cigarette with a mild insert. Nasal congestion especially left side. Occasional blood from left nostril. Chest x-ray: 07/15/2011-hyperinflation, no acute disease. I reviewed images.  02/25/12- 50 yoF smoker followed for COPD, tobacco cessation support, rhinitis, complicated by hx anxiety : c/o sob, congestion, and chest tightness. She is remaining off of cigarettes. Her COPD assessment test (CAT) score is 30/40 She had done well for the last several months as her last visit. Just 3 days ago and acute illness began as a gastroenteritis followed by shortness of breath, nonproductive cough and wheeze. She increased her oxygen. Using her nebulizer. Feels better today.  Review of Systems-per HPI Constitutional:   No-   weight loss, night sweats, fevers, chills, fatigue, lassitude. HEENT:   No-  headaches, difficulty swallowing, tooth/dental problems, sore throat,       No-  sneezing,  itching, ear ache, +nasal congestion, post nasal drip,  CV:  No-   chest pain, orthopnea, PND, swelling in lower extremities, anasarca,  dizziness, palpitations Resp: +Persistent shortness of breath with exertion or at rest.              + productive cough,  + non-productive cough,  No- coughing up of blood.              No-   change in color of mucus.  + wheezing.   Skin: No-   rash or lesions. GI:  No-   heartburn, indigestion, abdominal pain, nausea, vomiting,  GU:  MS:  No-   joint pain or swelling.   Neuro-     nothing unusual Psych:  No- change in mood or affect. No depression or anxiety.  No memory loss.   Objective:   Physical Exam General- Alert, Oriented, Affect-appropriate, Distress- none acute. O2 2L/ 96%, slender Skin- scaling rash lower legs in patches, lesions- none, excoriation- none. Fungal type nail changes. Lymphadenopathy- none Head- atraumatic            Eyes- Gross vision intact, PERRLA, conjunctivae clear secretions            Ears- Hearing, canals-normal            Nose- turbinate edema, no-Septal dev, mucus, polyps, erosion, perforation             Throat- Mallampati II , mucosa clear , drainage- none, tonsils- atrophic Neck- flexible , trachea midline, no stridor , thyroid nl, carotid no bruit Chest - symmetrical excursion , unlabored  Heart/CV- RRR , no murmur , no gallop  , no rub, nl s1 s2                           - JVD- none , edema- none, stasis changes- none, varices- none           Lung- very distant, wheeze- none, dry cough with deep breath , dullness-none, rub- none. Tripod posture           Chest wall-  Abd-  Br/ Gen/ Rectal- Not done, not indicated Extrem- cyanosis- none, clubbing, none, atrophy- none, strength- nl Neuro- grossly intact to observation

## 2012-03-01 NOTE — Assessment & Plan Note (Signed)
COPD exacerbation consistent with an acute viral syndrome. She is beginning to wheeze more but is not currently laboring. We reviewed options. Rest and extra fluids are encouraged. Antibiotic is not yet indicated Plan-nebulizer treatment with Xopenex. Prednisone 8- day taper to hold.

## 2012-03-01 NOTE — Assessment & Plan Note (Signed)
I reinforced our support for staying off cigarettes completely.

## 2012-04-16 ENCOUNTER — Telehealth: Payer: Self-pay | Admitting: Internal Medicine

## 2012-04-16 MED ORDER — DOXYCYCLINE HYCLATE 100 MG PO TABS: 100.0000 mg | ORAL_TABLET | Freq: Two times a day (BID) | ORAL | Status: AC

## 2012-04-16 MED ORDER — PREDNISONE 10 MG PO TABS: ORAL_TABLET | ORAL | Status: DC

## 2012-04-16 NOTE — Telephone Encounter (Signed)
Pt is aware of [facility] and rx has been sent to the pharmacy. Will forward to Dr. Maple Hudson as an Lorain Childes

## 2012-04-16 NOTE — Telephone Encounter (Signed)
Ok to call in doxycycline 100mg  one bid for 5 days, no fills Prednisone 10mg , take 40mg /day for 2 days, then 20mg /day for 2 days, then 10mg /day for 2 days, then stop.

## 2012-04-16 NOTE — Telephone Encounter (Signed)
Called, spoke with pt.  C/o a lot of chest congestion, prod cough with yellow mucus, increased SOB at rest and with exertion, and chest tightness x 3 days.  No fever or wheezing.  Using albuterol hfa and neb with not much relief.  Requesting rx -- as Dr. Maple Hudson is not in office today, will forward msg to doc of the day.  Dr. Shelle Iron, pls advise.  Thank you.  Allergies  Allergen Reactions  . Fexofenadine     REACTION: nausea  . Other     PAIN MEDICATIONS-nausea    [facility]

## 2012-04-24 ENCOUNTER — Telehealth: Payer: Self-pay | Admitting: Internal Medicine

## 2012-04-24 ENCOUNTER — Other Ambulatory Visit: Payer: Self-pay | Admitting: Internal Medicine

## 2012-04-24 MED ORDER — FLUTICASONE-SALMETEROL 500-50 MCG/DOSE IN AEPB: 1.0000 | INHALATION_SPRAY | Freq: Two times a day (BID) | RESPIRATORY_TRACT | Status: DC

## 2012-04-24 NOTE — Telephone Encounter (Signed)
[facility].  Pt is aware.  Her insurance is no longer covering the ventolin, changed to proair.  Pt also requested refills on her advair 500-39mcg.  This has been sent as well.  Nothing further needed; will sign off.

## 2012-04-24 NOTE — Telephone Encounter (Signed)
I spoke with pt and she states medicaid no longer covers rx for ventolin it will only cover proair. It looks like rx was sent by [facility] today for ventolin x 0 refills. Pt stated she is not due back until December and would rx sent with refills. [facility] and [facility]--I was advised that they have been giving her proair bc the ventolin is not covered. Will send new rx with refills for pt and pt is aware of this. Will change the ventolin to proair on pt medlist. Nothing further needed

## 2012-05-15 ENCOUNTER — Telehealth: Payer: Self-pay | Admitting: Internal Medicine

## 2012-05-15 NOTE — Telephone Encounter (Signed)
Spoke with pt. She is requesting jury duty excuse letter ASAP.  I advised her that she has to bring the summons here and then we will go from there. She will bring this tomorrow. Will forward to [name] to keep an eye out. Thanks!

## 2012-05-16 ENCOUNTER — Other Ambulatory Visit: Payer: Self-pay | Admitting: Pulmonary Disease

## 2012-05-16 ENCOUNTER — Telehealth: Payer: Self-pay | Admitting: Internal Medicine

## 2012-05-16 NOTE — Telephone Encounter (Signed)
Pt came by and dropped off Wendy [name] duty papers for CY to fill out.  These papers have been given to [facility]. Pt would like a copy of the filled out jury paper when they are completed mailed to her home address.  Also[facility] [address]  Pt needs refill for Advair Gerome Apley McAlister`

## 2012-05-16 NOTE — Telephone Encounter (Signed)
Made in error. Emily E McAlister  °

## 2012-05-20 ENCOUNTER — Encounter: Payer: Self-pay | Admitting: Internal Medicine

## 2012-05-20 NOTE — Telephone Encounter (Signed)
Advair rx was sent on 8/30 -- Called [facility], spoke with [facility].  Was advised they did receive this rx.  Called, spoke with pt.  She is aware of above and requesting we complete the jury paper asap.  Will route to [facility] and Dr. Maple Hudson as Lorain Childes.

## 2012-05-20 NOTE — Telephone Encounter (Signed)
Letter dictated by CY-patient is aware that we have mailed to [place](states she has 7 days to get this to the courts) and requested copy of letter be mailed to her as well. Both have been mailed today. If any troubles with this then the patient will call our office.

## 2012-07-21 ENCOUNTER — Telehealth: Payer: Self-pay | Admitting: Internal Medicine

## 2012-07-21 MED ORDER — AZITHROMYCIN 250 MG PO TABS: ORAL_TABLET | ORAL | Status: DC

## 2012-07-21 NOTE — Telephone Encounter (Signed)
Per CY - send in a Zpack  Rx has been sent in, pt is aware.

## 2012-07-21 NOTE — Telephone Encounter (Signed)
I spoke with pt and she c/o cough w/ lots of thick yellow phlem, chest congestion, chest sore, wheezing, chest tx, and vomiting from all the coughing x 3 days. No f/c/s. She is requesting to have something called in for her. Please advise Dr. Maple Hudson thanks  Allergies  Allergen Reactions  . Fexofenadine     REACTION: nausea  . Other     PAIN MEDICATIONS-nausea

## 2012-08-19 ENCOUNTER — Telehealth: Payer: Self-pay | Admitting: Internal Medicine

## 2012-08-19 MED ORDER — ALPRAZOLAM 1 MG PO TABS: 1.0000 mg | ORAL_TABLET | Freq: Three times a day (TID) | ORAL | Status: DC | PRN

## 2012-08-19 NOTE — Telephone Encounter (Signed)
CY Please advise if okay to refill.  

## 2012-08-19 NOTE — Telephone Encounter (Signed)
Xanax 1 mg rx was called in today for #90 x 5.    Called, [facility]  States they did receive the xanax rx called in.  Nothing further needed.

## 2012-08-19 NOTE — Telephone Encounter (Signed)
Per CY-okay to refill and we have not seen any refill requests from the pharmacy.  Pt aware that I have called the refill to pharmacy.

## 2012-08-26 ENCOUNTER — Ambulatory Visit: Payer: Medicaid Other | Admitting: Internal Medicine

## 2012-08-29 ENCOUNTER — Telehealth: Payer: Self-pay | Admitting: Internal Medicine

## 2012-08-29 NOTE — Telephone Encounter (Signed)
Per [facility] okay for her to come in for this. She is scheduled for 09/03/12. Flu shot has been giving to the allergy lab

## 2012-08-29 NOTE — Telephone Encounter (Signed)
I spoke with pt and she stated she would like to come in and have her flu shot 09/03/12. Pt had to cancel her appt for 09/02/12 w/ [facility] bc she had no transportation. Pt rescheduled for 09/19/12. Pt stated however she does have transportation for 09/03/12 so she would like to just come in for flu shot. Please advise Dr. Maple Hudson thanks Last OV 02/25/12 Pending OV 09/19/12

## 2012-09-02 ENCOUNTER — Ambulatory Visit: Payer: Medicaid Other | Admitting: Internal Medicine

## 2012-09-03 ENCOUNTER — Ambulatory Visit: Payer: Medicaid Other

## 2012-09-04 ENCOUNTER — Other Ambulatory Visit: Payer: Self-pay | Admitting: Internal Medicine

## 2012-09-04 MED ORDER — TIOTROPIUM BROMIDE MONOHYDRATE 18 MCG IN CAPS: 18.0000 ug | ORAL_CAPSULE | Freq: Every day | RESPIRATORY_TRACT | Status: DC

## 2012-09-12 ENCOUNTER — Telehealth: Payer: Self-pay | Admitting: Internal Medicine

## 2012-09-12 MED ORDER — DOXYCYCLINE HYCLATE 100 MG PO TABS: 100.0000 mg | ORAL_TABLET | Freq: Every day | ORAL | Status: DC

## 2012-09-12 NOTE — Telephone Encounter (Signed)
Per CY-okay to give Doxycycline 100mg  #8 take 2 today then 1 daily no refills. Pt aware and sent to [facility] [address]

## 2012-09-12 NOTE — Telephone Encounter (Signed)
Spoke with pt She is c/o increased SOB, prod cough with moderate yellow/grey sputum, minimal wheezing, and chest tightness x 3 days She denied any f/c/s or CP Would like something called in She was last seen 02/25/12 and has ov pending for 09/19/12 She has cancelled her last 2 scheduled appts with [facility] Please advise, thanks! Allergies  Allergen Reactions  . Fexofenadine     REACTION: nausea  . Other     PAIN MEDICATIONS-nausea

## 2012-09-19 ENCOUNTER — Ambulatory Visit: Payer: Medicaid Other | Admitting: Internal Medicine

## 2012-09-30 ENCOUNTER — Telehealth: Payer: Self-pay | Admitting: Internal Medicine

## 2012-09-30 MED ORDER — AMOXICILLIN 500 MG PO CAPS: 500.0000 mg | ORAL_CAPSULE | Freq: Three times a day (TID) | ORAL | Status: DC

## 2012-09-30 MED ORDER — FLUTICASONE PROPIONATE 50 MCG/ACT NA SUSP: 2.0000 | Freq: Every day | NASAL | Status: DC

## 2012-09-30 MED ORDER — ALBUTEROL SULFATE (2.5 MG/3ML) 0.083% IN NEBU: 2.5000 mg | INHALATION_SOLUTION | Freq: Four times a day (QID) | RESPIRATORY_TRACT | Status: DC

## 2012-09-30 NOTE — Telephone Encounter (Signed)
Per CY-Amoxicillin 500 mg # 21 take 1 po tid no refills.   Pt aware that Rx sent; requested refills for albuterol neb and flonase-sent 1 time of each and pt to keep OV 10-16-12.

## 2012-09-30 NOTE — Telephone Encounter (Signed)
I spoke with pt. C/o dry cough, wheezing, chest tx, chest congestion, increase SOB, nasal congestion, PND, slight low grade fever, chills. Pt was giving doxy on 09/12/13. She is taking advair/spiriva and doing her neb tx's. Pt requesting further recs. Please advise thanks Last OV 02/25/12 Pending OV 10/16/12--pt has cancelled past 3 appts.  Allergies  Allergen Reactions  . Fexofenadine     REACTION: nausea  . Other     PAIN MEDICATIONS-nausea

## 2012-10-01 ENCOUNTER — Telehealth: Payer: Self-pay | Admitting: Internal Medicine

## 2012-10-01 NOTE — Telephone Encounter (Signed)
[facility]states patient usually gets 4 boxes of albuterol nebulizer; I gave verbal to correct this. Nothing more needed. Update med list to reflect change.

## 2012-10-13 ENCOUNTER — Other Ambulatory Visit: Payer: Self-pay | Admitting: Pulmonary Disease

## 2012-10-16 ENCOUNTER — Ambulatory Visit (INDEPENDENT_AMBULATORY_CARE_PROVIDER_SITE_OTHER): Payer: Medicare Other | Admitting: Internal Medicine

## 2012-10-16 ENCOUNTER — Encounter: Payer: Self-pay | Admitting: Internal Medicine

## 2012-10-16 ENCOUNTER — Ambulatory Visit (INDEPENDENT_AMBULATORY_CARE_PROVIDER_SITE_OTHER)
Admission: RE | Admit: 2012-10-16 | Discharge: 2012-10-16 | Disposition: A | Discharge status: Home / Self Care | Payer: Medicaid Other | Source: Ambulatory Visit | Attending: Internal Medicine | Admitting: Internal Medicine

## 2012-10-16 VITALS — BP 108/72 | HR 105 | Ht 61.0 in | Wt 116.8 lb

## 2012-10-16 DIAGNOSIS — F17200 Nicotine dependence, unspecified, uncomplicated: Secondary | ICD-10-CM

## 2012-10-16 DIAGNOSIS — J449 Chronic obstructive pulmonary disease, unspecified: Secondary | ICD-10-CM

## 2012-10-16 DIAGNOSIS — R21 Rash and other nonspecific skin eruption: Secondary | ICD-10-CM

## 2012-10-16 DIAGNOSIS — Z23 Encounter for immunization: Secondary | ICD-10-CM

## 2012-10-16 MED ORDER — ALBUTEROL SULFATE (2.5 MG/3ML) 0.083% IN NEBU
2.5000 mg | INHALATION_SOLUTION | Freq: Four times a day (QID) | RESPIRATORY_TRACT | Status: DC
Start: 2012-10-16 — End: 2013-07-10

## 2012-10-16 NOTE — Patient Instructions (Addendum)
Order- CXR, PFT     Dx COPD  Script for neb medication sent  Flu vax

## 2012-10-16 NOTE — Progress Notes (Signed)
Patient ID: Wendy [name], female    DOB: [birth year], 52 y.o.   MRN: [id number] HPI 03/15/11- 65 yoF smoker followed for COPD, tobacco cessation support, rhinitis, complicated by hx anxiety Last here November 06, 2010- note reviewed. She has not smoked since December 2011.  Using her oxygen for the past week since she caught a cold with head and chest congestion, purulent sputum, hoarse. No fever or sore throat. Using her regular meds. Using her nebulizer regularly.  Had 7 teeth pulled in May.   08/16/11- 50 yoF smoker followed for COPD, tobacco cessation support, rhinitis, complicated by hx anxiety Has had flu vaccine. October 28 she went to [facility] with acute bronchitis after catching a cold. Got better after treatment there but not well. We called in prednisone and a Z-Pak. She still feels some chest congestion, cough with clear mucus, feels tight. Denies fever, chest pain or blood. Has had oxygen for several months, used most of the time. Says she quit cigarettes this year by using an electronic cigarette with a mild insert. Nasal congestion especially left side. Occasional blood from left nostril. Chest x-ray: 07/15/2011-hyperinflation, no acute disease. I reviewed images.  02/25/12- 50 yoF smoker followed for COPD, tobacco cessation support, rhinitis, complicated by hx anxiety : c/o sob, congestion, and chest tightness. She is remaining off of cigarettes. Her COPD assessment test (CAT) score is 30/40 She had done well for the last several months as her last visit. Just 3 days ago and acute illness began as a gastroenteritis followed by shortness of breath, nonproductive cough and wheeze. She increased her oxygen. Using her nebulizer. Feels better today.  10/16/12- 51 yoF formersmoker followed for COPD, tobacco cessation support, rhinitis, complicated by hx anxiety follow-up: Pt c/o increased SOB with activity that has gradually been worse since last OV.  Antibiotics 2 or 3  times for bronchitis since last here. Needs flu vaccine. Oxygen 2-3 L/Advanced. Nebulizer helps. Not acute-this is baseline for her.  Review of Systems-per HPI Constitutional:   No-   weight loss, night sweats, fevers, chills, fatigue, lassitude. HEENT:   No-  headaches, difficulty swallowing, tooth/dental problems, sore throat,       No-  sneezing, itching, ear ache, +nasal congestion, post nasal drip,  CV:  No-   chest pain, orthopnea, PND, swelling in lower extremities, anasarca,  dizziness, palpitations Resp: +Persistent shortness of breath with exertion or at rest.              + productive cough,  + non-productive cough,  No- coughing up of blood.              No-   change in color of mucus.  + wheezing.   Skin: No-   rash or lesions. GI:  No-   heartburn, indigestion, abdominal pain, nausea, vomiting,  GU:  MS:  No-   joint pain or swelling.   Neuro-     nothing unusual Psych:  No- change in mood or affect. No depression or anxiety.  No memory loss.   Objective:   Physical Exam General- Alert, Oriented, Affect-appropriate, Distress- none acute. O2 2L/ 96%, slender Skin- +psoriasis on the legs and nail changes. Lymphadenopathy- none Head- atraumatic            Eyes- Gross vision intact, PERRLA, conjunctivae clear secretions            Ears- Hearing, canals-normal            Nose- turbinate  edema, no-Septal dev, mucus, polyps, erosion, perforation             Throat- Mallampati II , mucosa clear , drainage- none, tonsils- atrophic Neck- flexible , trachea midline, no stridor , thyroid nl, carotid no bruit Chest - symmetrical excursion , unlabored           Heart/CV- RRR , no murmur , no gallop  , no rub, nl s1 s2                           - JVD- none , edema- none, stasis changes- none, varices- none           Lung- very distant, wheeze- none, dry cough with deep breath , dullness-none, rub- none. Oxygen off while talking with me-unlabored.           Chest wall-  Abd-  Br/  Gen/ Rectal- Not done, not indicated Extrem- cyanosis- none, clubbing, none, atrophy- none, strength- nl Neuro- grossly intact to observation

## 2012-10-20 NOTE — Progress Notes (Signed)
Quick Note:  LMTCB ______ 

## 2012-10-20 NOTE — Progress Notes (Signed)
Quick Note:  Pt aware of results. ______ 

## 2012-10-25 NOTE — Assessment & Plan Note (Signed)
Says she is staying off of tobacco. Encouraged.

## 2012-10-25 NOTE — Assessment & Plan Note (Signed)
Oxygen dependent at least for sleep and exertion Needs update PFT and chest x-ray, flu vaccine

## 2012-10-25 NOTE — Assessment & Plan Note (Signed)
This looks like psoriasis and she said that was the diagnosis she had been given

## 2012-11-13 ENCOUNTER — Other Ambulatory Visit: Payer: Self-pay | Admitting: Internal Medicine

## 2012-11-14 ENCOUNTER — Telehealth: Payer: Self-pay | Admitting: Internal Medicine

## 2012-11-14 MED ORDER — FLUTICASONE-SALMETEROL 500-50 MCG/DOSE IN AEPB: 1.0000 | INHALATION_SPRAY | Freq: Two times a day (BID) | RESPIRATORY_TRACT | Status: DC

## 2012-11-14 NOTE — Telephone Encounter (Signed)
Refill of the advair has been sent to the pharmacy and pt is aware.

## 2012-11-21 ENCOUNTER — Ambulatory Visit: Payer: Medicaid Other | Admitting: Internal Medicine

## 2012-11-24 ENCOUNTER — Telehealth: Payer: Self-pay | Admitting: Internal Medicine

## 2012-11-24 MED ORDER — PREDNISONE 10 MG PO TABS: ORAL_TABLET | ORAL | Status: DC

## 2012-11-24 NOTE — Telephone Encounter (Signed)
I spoke with pt and she c/o chest congestion, chest tx, wheezing, increase SOB, dry cough but can occasional get up clear phlem x Friday. No f/c/s/n/v. She is requesting prednisone. Please advise Dr. Maple Hudson thanks Last OV 10/16/12 Last OV 12/26/12 Allergies  Allergen Reactions  . Fexofenadine     REACTION: nausea  . Other     PAIN MEDICATIONS-nausea

## 2012-11-24 NOTE — Telephone Encounter (Signed)
Per CY-okay to give patient Prednisone 10 mg #20 take 4 x 2 days, 3 x 2 days, 2 x 2 days, 1 x 2 days, then stop no refills.

## 2012-11-24 NOTE — Telephone Encounter (Signed)
I spoke with pt and is aware [facility] recs. rx has been sent. Nothing further was needed 

## 2012-11-26 ENCOUNTER — Encounter (HOSPITAL_COMMUNITY): Payer: Self-pay | Admitting: *Deleted

## 2012-11-26 ENCOUNTER — Inpatient Hospital Stay (HOSPITAL_COMMUNITY)
Admission: EM | Admit: 2012-11-26 | Discharge: 2012-12-01 | DRG: 917 | Disposition: A | Discharge status: Other Acute Inpatient Hospital | Payer: Medicare Other | Attending: Internal Medicine | Admitting: Internal Medicine

## 2012-11-26 DIAGNOSIS — J449 Chronic obstructive pulmonary disease, unspecified: Secondary | ICD-10-CM

## 2012-11-26 DIAGNOSIS — T43502A Poisoning by unspecified antipsychotics and neuroleptics, intentional self-harm, initial encounter: Secondary | ICD-10-CM | POA: Diagnosis present

## 2012-11-26 DIAGNOSIS — F329 Major depressive disorder, single episode, unspecified: Secondary | ICD-10-CM | POA: Diagnosis present

## 2012-11-26 DIAGNOSIS — J310 Chronic rhinitis: Secondary | ICD-10-CM | POA: Diagnosis present

## 2012-11-26 DIAGNOSIS — F1010 Alcohol abuse, uncomplicated: Secondary | ICD-10-CM

## 2012-11-26 DIAGNOSIS — G9340 Encephalopathy, unspecified: Secondary | ICD-10-CM | POA: Diagnosis present

## 2012-11-26 DIAGNOSIS — F1020 Alcohol dependence, uncomplicated: Secondary | ICD-10-CM

## 2012-11-26 DIAGNOSIS — F10239 Alcohol dependence with withdrawal, unspecified: Secondary | ICD-10-CM

## 2012-11-26 DIAGNOSIS — T424X4A Poisoning by benzodiazepines, undetermined, initial encounter: Principal | ICD-10-CM | POA: Diagnosis present

## 2012-11-26 DIAGNOSIS — T424X1A Poisoning by benzodiazepines, accidental (unintentional), initial encounter: Secondary | ICD-10-CM

## 2012-11-26 DIAGNOSIS — F411 Generalized anxiety disorder: Secondary | ICD-10-CM

## 2012-11-26 DIAGNOSIS — J309 Allergic rhinitis, unspecified: Secondary | ICD-10-CM | POA: Diagnosis present

## 2012-11-26 DIAGNOSIS — E871 Hypo-osmolality and hyponatremia: Secondary | ICD-10-CM | POA: Diagnosis present

## 2012-11-26 DIAGNOSIS — Z79899 Other long term (current) drug therapy: Secondary | ICD-10-CM

## 2012-11-26 DIAGNOSIS — R21 Rash and other nonspecific skin eruption: Secondary | ICD-10-CM

## 2012-11-26 DIAGNOSIS — J441 Chronic obstructive pulmonary disease with (acute) exacerbation: Secondary | ICD-10-CM | POA: Diagnosis present

## 2012-11-26 DIAGNOSIS — F17200 Nicotine dependence, unspecified, uncomplicated: Secondary | ICD-10-CM

## 2012-11-26 DIAGNOSIS — L408 Other psoriasis: Secondary | ICD-10-CM | POA: Diagnosis present

## 2012-11-26 DIAGNOSIS — L409 Psoriasis, unspecified: Secondary | ICD-10-CM

## 2012-11-26 HISTORY — DX: Dependence on supplemental oxygen: Z99.81

## 2012-11-26 HISTORY — DX: Major depressive disorder, single episode, unspecified: F32.9

## 2012-11-26 HISTORY — DX: Depression, unspecified: F32.A

## 2012-11-26 NOTE — ED Notes (Signed)
The pt arrived by gems from home where the pt ingested approx 25-50 xanax 1 mg unknown time.  The pt also had a 12-pack of beer sometime today.  The pt is arrousable to vigorous stimuli and mumbles then falls back to sleep.  Iv per gems

## 2012-11-26 NOTE — ED Notes (Signed)
According to ems the family  Here and every one smells of alcohol.  No one of the family has a clear picture  Of when and what time she ingested the med.  The pt cannot tell me  When she ate last

## 2012-11-27 ENCOUNTER — Emergency Department (HOSPITAL_COMMUNITY): Payer: Medicare Other

## 2012-11-27 ENCOUNTER — Encounter (HOSPITAL_COMMUNITY): Payer: Self-pay | Admitting: *Deleted

## 2012-11-27 DIAGNOSIS — T424X1A Poisoning by benzodiazepines, accidental (unintentional), initial encounter: Secondary | ICD-10-CM

## 2012-11-27 DIAGNOSIS — F10939 Alcohol use, unspecified with withdrawal, unspecified: Secondary | ICD-10-CM | POA: Diagnosis not present

## 2012-11-27 DIAGNOSIS — T424X4A Poisoning by benzodiazepines, undetermined, initial encounter: Principal | ICD-10-CM

## 2012-11-27 DIAGNOSIS — F10239 Alcohol dependence with withdrawal, unspecified: Secondary | ICD-10-CM | POA: Diagnosis not present

## 2012-11-27 DIAGNOSIS — F1310 Sedative, hypnotic or anxiolytic abuse, uncomplicated: Secondary | ICD-10-CM

## 2012-11-27 DIAGNOSIS — F17200 Nicotine dependence, unspecified, uncomplicated: Secondary | ICD-10-CM

## 2012-11-27 DIAGNOSIS — F411 Generalized anxiety disorder: Secondary | ICD-10-CM

## 2012-11-27 DIAGNOSIS — F1010 Alcohol abuse, uncomplicated: Secondary | ICD-10-CM

## 2012-11-27 DIAGNOSIS — F1020 Alcohol dependence, uncomplicated: Secondary | ICD-10-CM

## 2012-11-27 DIAGNOSIS — J449 Chronic obstructive pulmonary disease, unspecified: Secondary | ICD-10-CM

## 2012-11-27 LAB — BASIC METABOLIC PANEL
BUN: 8 mg/dL (ref 6–23)
CO2: 25 mEq/L (ref 19–32)
Calcium: 8.3 mg/dL — ABNORMAL LOW (ref 8.4–10.5)
Creatinine, Ser: 0.5 mg/dL (ref 0.50–1.10)
GFR calc non Af Amer: >90 mL/min (ref >90)
Glucose, Bld: 84 mg/dL (ref 70–99)

## 2012-11-27 LAB — CBC
HCT: 30.8 % — ABNORMAL LOW (ref 36.0–46.0)
Hemoglobin: 10.9 g/dL — ABNORMAL LOW (ref 12.0–15.0)
MCH: 33.6 pg (ref 26.0–34.0)
MCHC: 35.4 g/dL (ref 30.0–36.0)
MCV: 95.1 fL (ref 78.0–100.0)
RBC: 3.24 MIL/uL — ABNORMAL LOW (ref 3.87–5.11)

## 2012-11-27 LAB — CBC WITH DIFFERENTIAL/PLATELET
Basophils Absolute: 0 10*3/uL (ref 0.0–0.1)
Basophils Relative: 0 % (ref 0–1)
Eosinophils Relative: 0 % (ref 0–5)
HCT: 36.2 % (ref 36.0–46.0)
MCHC: 35.9 g/dL (ref 30.0–36.0)
MCV: 94.8 fL (ref 78.0–100.0)
Monocytes Absolute: 0.7 10*3/uL (ref 0.1–1.0)
Neutro Abs: 5.2 10*3/uL (ref 1.7–7.7)
Platelets: 183 10*3/uL (ref 150–400)
RDW: 11.9 % (ref 11.5–15.5)

## 2012-11-27 LAB — POCT I-STAT, CHEM 8
HCT: 40 % (ref 36.0–46.0)
Hemoglobin: 13.6 g/dL (ref 12.0–15.0)
Sodium: 129 mEq/L — ABNORMAL LOW (ref 135–145)
TCO2: 32 mmol/L (ref 0–100)

## 2012-11-27 LAB — LACTIC ACID, PLASMA: Lactic Acid, Venous: 1.8 mmol/L (ref 0.5–2.2)

## 2012-11-27 LAB — ACETAMINOPHEN LEVEL: Acetaminophen (Tylenol), Serum: <15 ug/mL (ref 10–30)

## 2012-11-27 LAB — ETHANOL: Alcohol, Ethyl (B): 279 mg/dL — ABNORMAL HIGH (ref 0–11)

## 2012-11-27 LAB — RAPID URINE DRUG SCREEN, HOSP PERFORMED: Barbiturates: NOT DETECTED

## 2012-11-27 LAB — PHOSPHORUS: Phosphorus: 3.6 mg/dL (ref 2.3–4.6)

## 2012-11-27 MED ORDER — ENOXAPARIN SODIUM 40 MG/0.4ML ~~LOC~~ SOLN
40.0000 mg | SUBCUTANEOUS | Status: DC
  Administered 2012-11-27 – 2012-12-01 (×5): 40 mg via SUBCUTANEOUS
  Filled 2012-11-27 (×5): qty 0.4

## 2012-11-27 MED ORDER — METHYLPREDNISOLONE SODIUM SUCC 125 MG IJ SOLR
60.0000 mg | Freq: Two times a day (BID) | INTRAMUSCULAR | Status: DC
  Administered 2012-11-27 – 2012-11-28 (×4): 60 mg via INTRAVENOUS
  Filled 2012-11-27 (×5): qty 0.96

## 2012-11-27 MED ORDER — THIAMINE HCL 100 MG/ML IJ SOLN: 100.0000 mg | Freq: Every day | INTRAMUSCULAR | Status: DC

## 2012-11-27 MED ORDER — TIOTROPIUM BROMIDE MONOHYDRATE 18 MCG IN CAPS
18.0000 ug | ORAL_CAPSULE | Freq: Every day | RESPIRATORY_TRACT | Status: DC
  Administered 2012-11-27 – 2012-12-01 (×5): 18 ug via RESPIRATORY_TRACT
  Filled 2012-11-27 (×2): qty 5

## 2012-11-27 MED ORDER — VITAMIN B-1 100 MG PO TABS
100.0000 mg | ORAL_TABLET | Freq: Every day | ORAL | Status: AC
  Administered 2012-11-28 – 2012-11-29 (×2): 100 mg via ORAL
  Filled 2012-11-27 (×3): qty 1

## 2012-11-27 MED ORDER — ALBUTEROL SULFATE (5 MG/ML) 0.5% IN NEBU
2.5000 mg | INHALATION_SOLUTION | RESPIRATORY_TRACT | Status: DC
  Administered 2012-11-27 – 2012-11-28 (×9): 2.5 mg via RESPIRATORY_TRACT
  Filled 2012-11-27 (×9): qty 0.5

## 2012-11-27 MED ORDER — ALBUTEROL SULFATE (5 MG/ML) 0.5% IN NEBU: 2.5000 mg | INHALATION_SOLUTION | RESPIRATORY_TRACT | Status: DC | PRN

## 2012-11-27 MED ORDER — FLUTICASONE PROPIONATE 50 MCG/ACT NA SUSP
2.0000 | Freq: Every day | NASAL | Status: DC
  Administered 2012-11-27 – 2012-12-01 (×5): 2 via NASAL
  Filled 2012-11-27 (×2): qty 16

## 2012-11-27 MED ORDER — MOMETASONE FURO-FORMOTEROL FUM 200-5 MCG/ACT IN AERO
2.0000 | INHALATION_SPRAY | Freq: Two times a day (BID) | RESPIRATORY_TRACT | Status: DC
  Administered 2012-11-27 – 2012-12-01 (×9): 2 via RESPIRATORY_TRACT
  Filled 2012-11-27 (×2): qty 8.8

## 2012-11-27 MED ORDER — THIAMINE HCL 100 MG/ML IJ SOLN
Freq: Once | INTRAVENOUS | Status: AC
  Administered 2012-11-27: 03:00:00 via INTRAVENOUS
  Filled 2012-11-27: qty 1000

## 2012-11-27 MED ORDER — SODIUM CHLORIDE 0.9 % IV SOLN: 250.0000 mL | INTRAVENOUS | Status: DC | PRN

## 2012-11-27 MED ORDER — LORAZEPAM 1 MG PO TABS
1.0000 mg | ORAL_TABLET | Freq: Four times a day (QID) | ORAL | Status: AC | PRN
  Administered 2012-11-27: 1 mg via ORAL
  Administered 2012-11-28: 2 mg via ORAL
  Administered 2012-11-28 – 2012-11-30 (×3): 1 mg via ORAL
  Filled 2012-11-27 (×3): qty 1

## 2012-11-27 MED ORDER — LORAZEPAM 1 MG PO TABS
0.0000 mg | ORAL_TABLET | Freq: Four times a day (QID) | ORAL | Status: AC
  Administered 2012-11-27 (×2): 1 mg via ORAL
  Administered 2012-11-28: 2 mg via ORAL
  Administered 2012-11-28 (×2): 1 mg via ORAL
  Administered 2012-11-28: 2 mg via ORAL
  Administered 2012-11-29 (×2): 1 mg via ORAL
  Filled 2012-11-27 (×3): qty 1
  Filled 2012-11-27: qty 2
  Filled 2012-11-27: qty 1
  Filled 2012-11-27: qty 2
  Filled 2012-11-27 (×2): qty 1

## 2012-11-27 MED ORDER — LORAZEPAM 1 MG PO TABS
0.0000 mg | ORAL_TABLET | Freq: Two times a day (BID) | ORAL | Status: AC
  Administered 2012-11-29: 2 mg via ORAL
  Administered 2012-11-29: 1 mg via ORAL
  Administered 2012-11-29: 12:00:00 via ORAL
  Administered 2012-11-30: 1 mg via ORAL
  Filled 2012-11-27 (×2): qty 1
  Filled 2012-11-27: qty 2
  Filled 2012-11-27: qty 1
  Filled 2012-11-27: qty 2

## 2012-11-27 MED ORDER — IPRATROPIUM BROMIDE 0.02 % IN SOLN
0.5000 mg | RESPIRATORY_TRACT | Status: DC
  Administered 2012-11-27 – 2012-11-28 (×9): 0.5 mg via RESPIRATORY_TRACT
  Filled 2012-11-27 (×9): qty 2.5

## 2012-11-27 MED ORDER — FOLIC ACID 1 MG PO TABS
1.0000 mg | ORAL_TABLET | Freq: Every day | ORAL | Status: AC
  Administered 2012-11-28 – 2012-11-29 (×2): 1 mg via ORAL
  Filled 2012-11-27 (×3): qty 1

## 2012-11-27 MED ORDER — ADULT MULTIVITAMIN W/MINERALS CH
1.0000 | ORAL_TABLET | Freq: Every day | ORAL | Status: DC
  Administered 2012-11-27 – 2012-12-01 (×5): 1 via ORAL
  Filled 2012-11-27 (×5): qty 1

## 2012-11-27 MED ORDER — ALBUTEROL SULFATE (5 MG/ML) 0.5% IN NEBU: 2.5000 mg | INHALATION_SOLUTION | Freq: Four times a day (QID) | RESPIRATORY_TRACT | Status: DC

## 2012-11-27 MED ORDER — LORAZEPAM 2 MG/ML IJ SOLN: 1.0000 mg | Freq: Four times a day (QID) | INTRAMUSCULAR | Status: AC | PRN

## 2012-11-27 NOTE — ED Notes (Signed)
Family at the bedside keeping her awake.  Her bp in the 80s unles stimulated.  At present both eyes are open and she is mumbling.  Her bp has increased.  She still does not know where she is .

## 2012-11-27 NOTE — Progress Notes (Signed)
PULMONARY  / CRITICAL CARE MEDICINE  Name: Wendy [name] MRN: [id number] DOB: [birth year]    ADMISSION DATE:  11/26/2012 CONSULTATION DATE:  11/27/2012  REFERRING MD :  Terressa Koyanagi EDP  CHIEF COMPLAINT:  Intentional overdose  BRIEF PATIENT DESCRIPTION: 52 y/o female with heavy EtOH abuse and COPD was admitted from the [facility] ED on 3/13 for intentional overdose with Alprazolam.  SIGNIFICANT EVENTS / STUDIES:   LINES / TUBES:  CULTURES: MRSA 3/13 >. negative  ANTIBIOTICS:   HISTORY OF PRESENT ILLNESS:  52 y/o female with heavy EtOH abuse and COPD was admitted from the [facility] ED on 3/13 for intentional overdose with Alprazolam. Her daughters state that she has never attempted suicide in the past but has been feeling depressed lately.  She drinks between 12 and 24 beers a day and drank 12 beers on 3/12.  She was found by her husband around 2200 on 3/12 on the floor of her bathroom, somnolent.  She admitted to taking a whole bottle of alprazolam 1mg  tablets.  Her family estimates that she took 39 tablets.     For several days prior to admission she had been complaining of R chest pain and increasing dyspnea.  A prednisone taper had been called in by Dr. Maple Hudson for this.  SUBJECTIVE:  Oriented, much more awake  VITAL SIGNS: Temp:  [98.3 F (36.8 C)-98.7 F (37.1 C)] 98.3 F (36.8 C) (03/13 0845) Pulse Rate:  [75-95] 93 (03/13 1200) Resp:  [12-20] 19 (03/13 1200) BP: (83-142)/(59-91) 123/75 mmHg (03/13 1200) SpO2:  [91 %-100 %] 100 % (03/13 1200) Weight:  [55.2 kg (121 lb 11.1 oz)] 55.2 kg (121 lb 11.1 oz) (03/13 0330) HEMODYNAMICS:   VENTILATOR SETTINGS:   INTAKE / OUTPUT: Intake/Output     03/12 0701 - 03/13 0700 03/13 0701 - 03/14 0700   I.V. (mL/kg) 413.3 (7.5) 510 (9.2)   Total Intake(mL/kg) 413.3 (7.5) 510 (9.2)   Urine (mL/kg/hr)  1100 (3.6)   Total Output   1100   Net +413.3 -590          PHYSICAL EXAMINATION: Gen: sitting up in bed, no acute distress HEENT: NCAT,  PERRL, EOMi, OP clear, neck supple without masses PULM: good air movement, no accessory muscle use CV: RRR, no mgr, no JVD AB: BS+, soft, nontender, no hsm Ext: warm, no edema, no clubbing, no cyanosis Derm: no rash or skin breakdown Neuro: awake, oriented, non-focal, no tremor or agitation   LABS:  Recent Labs Lab 11/26/12 2340 11/26/12 2341 11/27/12 0014 11/27/12 0450  HGB 13.0  --  13.6 10.9*  WBC 7.1  --   --  5.1  PLT 183  --   --  160  NA  --   --  129* 135  K  --   --  3.9 3.6  CL  --   --  89* 97  CO2  --   --   --  25  GLUCOSE  --   --  90 84  BUN  --   --  8 8  CREATININE  --   --  1.00 0.50  CALCIUM  --   --   --  8.3*  MG  --   --   --  1.8  PHOS  --   --   --  3.6  LATICACIDVEN  --  1.8  --   --    No results found for this basename: GLUCAP,  in the last 168 hours  3/13  CXR: Emphysema bilaterally, no infiltrate  ASSESSMENT / PLAN: NEUROLOGIC A:  Intentional benzodiazepine overdose Acute encephalopathy from overdose Chronic heavy alcohol abuse  P:   -ICU monitoring -banana bag -intubate if mental status worsens -no role for flumazenil with chronic benzo use -will definitely need schedule and prn benzo's on 3/13  -psych evaluation 3/13, consult called -suicide precautions  PULMONARY A: Acute exacerbation of COPD improving on prednisone, currently not in respiratory distress P:   -solumedrol, convert to pred 3/14 -scheduled and prn bronchodilators -monitor in SDU, at risk w/d from EtOH and resp failure  CARDIOVASCULAR A: No acute issues P:  -tele  RENAL A:  Mild hyponatremia, likely related to heavy beer intake > resolved P:   -follow BMP  GASTROINTESTINAL A:  No acute issues P:   -start diet 3/13  HEMATOLOGIC A:  No acute issues P:  -monitor for bleeding  INFECTIOUS A:  No acute issues P:     ENDOCRINE A:  No acute issues P:     TODAY'S SUMMARY: 53 y/o female with intentional benzodiazepine overdose in the setting of  heavy EtOH abuse.  Does not need intubation. To SDU on 3/13 with CIWA. Psych to see. Transfer to [facility] as of 3/14  I have personally obtained a history, examined the patient, evaluated laboratory and imaging results, formulated the assessment and plan and placed orders.    Levy Pupa, MD, PhD 11/27/2012, 12:30 PM [place] [phone] or if no answer [phone]

## 2012-11-27 NOTE — Consult Note (Signed)
Patient Identification:  Wendy [name] Date of Evaluation:  11/27/2012 Reason for Consult: Suicidal Ideation, Alprazolam overdose  Referring Provider:  Dr. Derryl Harbor  History of Present Illness:  Pt was found by her husband on the bathroom floor. -  Had taken an overdose of benzodiazepines [ the entire bottle of 1 mg tablets] and had been drinking a large amount of alcohol.  She was brought to [facility] ED and transferred to ICU.   Past Psychiatric History: No knowledge of prior suicide attempts.  Cares for adult son with Down's Syndrome. Who lives with them.   Past Medical History:     Past Medical History  Diagnosis Date  . Bronchitis   . Asthma   . Seasonal allergies   . Poor dentition   . Alcoholism   . COPD (chronic obstructive pulmonary disease)   . Depression   . Oxygen dependent     home oxygen 2L/min       Past Surgical History  Procedure Laterality Date  . Cesarean section      x 2  . Tubal ligation      Allergies:  Allergies  Allergen Reactions  . Fexofenadine     REACTION: nausea  . Other     PAIN MEDICATIONS-nausea    Current Medications:  Prior to Admission medications   Medication Sig Start Date End Date Taking? Authorizing Provider  albuterol (PROVENTIL) (2.5 MG/3ML) 0.083% nebulizer solution Take 3 mLs (2.5 mg total) by nebulization 4 (four) times daily. 10/16/12 10/16/13 Yes Clinton D Young, MD  ALPRAZolam Prudy Feeler) 1 MG tablet Take 1 tablet (1 mg total) by mouth 3 (three) times daily as needed for sleep or anxiety. 08/19/12 08/19/13 Yes Clinton D Young, MD  fluticasone (FLONASE) 50 MCG/ACT nasal spray Place 2 sprays into the nose daily. 09/30/12 10/01/13 Yes Clinton D Young, MD  Fluticasone-Salmeterol (ADVAIR DISKUS) 500-50 MCG/DOSE AEPB Inhale 1 puff into the lungs 2 (two) times daily. 11/14/12  Yes Michele Mcalpine, MD  predniSONE (DELTASONE) 10 MG tablet Take 10-40 mg by mouth daily. Beginning 11/24/12 Tapered dose Take 4 daily for 2 days, then take 3 daily for 2  days, then take 2 daily for 2 days, then 1 daily til gone   Yes Historical Provider, MD  PROAIR HFA 108 (90 BASE) MCG/ACT inhaler INHALE 2 PUFFS FOUR TIMES A DAY AS NEEDED 04/24/12  Yes Waymon Budge, MD  tiotropium (SPIRIVA) 18 MCG inhalation capsule Place 1 capsule (18 mcg total) into inhaler and inhale daily. 09/04/12 09/04/13 Yes Waymon Budge, MD    Social History:    reports that she has been smoking Cigarettes.  She has a 40 pack-year smoking history. She has never used smokeless tobacco. She reports that she drinks about 9.0 ounces of alcohol per week. She reports that she does not use illicit drugs.   Family History:    Family History  Problem Relation Age of Onset  . Lung cancer Father   . COPD Father     Mental Status Examination/Evaluation: pending  Objective:  Appearance:   Eye Contact::    Speech:    Volume:    Mood:    Affect:    Thought Process:    Orientation:    Thought Content:    Suicidal Thoughts:    Homicidal Thoughts:    Judgement:    Insight:     DIAGNOSIS:   AXIS I  Benzodiazepine and alcohol Overdose Suicide Attempt; Alcohol abuse  AXIS II  Deferred  AXIS III See medical notes.  AXIS IV other psychosocial or environmental problems, problems related to social environment, problems with primary support group and Pt may have been in critiacal need of respite and did not know how to ask or obtain any.  AXIS V 41-50 serious symptoms   Assessment/Plan:   Dr. Derryl Harbor [phone]  Discussed with ICU RN.  Psych CSW Pt has been admitted to medical ICU.  She has progressively done well.  At this point, the CIWA protocol is in place with pt receiving ativan.   No apparent complications RECOMMENDATION:  1.  Defer evaluation until 11/28/12. 2.  Will follow pt.  Mickeal Skinner MD 11/27/2012 4:47 PM

## 2012-11-27 NOTE — ED Notes (Signed)
More alert still confused.  Vit bag liter hung

## 2012-11-27 NOTE — ED Notes (Signed)
Critical care here to see 

## 2012-11-27 NOTE — ED Provider Notes (Signed)
History     CSN: [id number]  Arrival date & time 11/26/12  2330   First MD Initiated Contact with Patient 11/26/12 2340      Chief Complaint  Patient presents with  . Drug Overdose    (Consider location/radiation/quality/duration/timing/severity/associated sxs/prior treatment) Patient is a 52 y.o. female presenting with Overdose. The history is provided by the EMS personnel. The history is limited by the condition of the patient. No language interpreter was used.  Drug Overdose This is a new problem. The current episode started 3 to 5 hours ago. The problem occurs constantly. The problem has been gradually worsening. Nothing aggravates the symptoms. Nothing relieves the symptoms. She has tried nothing for the symptoms. The treatment provided no relief.    Past Medical History  Diagnosis Date  . Bronchitis   . Asthma   . Seasonal allergies   . Poor dentition   . Alcoholism   . COPD (chronic obstructive pulmonary disease)   . Depression     Past Surgical History  Procedure Laterality Date  . Cesarean section      x 2  . Tubal ligation      Family History  Problem Relation Age of Onset  . Lung cancer Father   . COPD Father     History  Substance Use Topics  . Smoking status: Former Smoker -- 0.30 packs/day for 40 years    Types: Cigarettes    Quit date: 12/26/2011  . Smokeless tobacco: Never Used  . Alcohol Use: Yes     Comment: 5 cans of beer daily    OB History   Grav Para Term Preterm Abortions TAB SAB Ect Mult Living                  Review of Systems  Unable to perform ROS   Allergies  Fexofenadine and Other  Home Medications   Current Outpatient Rx  Name  Route  Sig  Dispense  Refill  . albuterol (PROVENTIL) (2.5 MG/3ML) 0.083% nebulizer solution   Nebulization   Take 3 mLs (2.5 mg total) by nebulization 4 (four) times daily.   300 mL   prn   . ALPRAZolam (XANAX) 1 MG tablet   Oral   Take 1 tablet (1 mg total) by mouth 3 (three) times  daily as needed for sleep or anxiety.   90 tablet   5   . fluticasone (FLONASE) 50 MCG/ACT nasal spray   Nasal   Place 2 sprays into the nose daily.   16 g   0   . Fluticasone-Salmeterol (ADVAIR DISKUS) 500-50 MCG/DOSE AEPB   Inhalation   Inhale 1 puff into the lungs 2 (two) times daily.   60 each   5   . predniSONE (DELTASONE) 10 MG tablet      Take 4 daily x 2 days, 3 daily x 2 days, 2 daily x 2 days, 1 daily x 2 days then stop   20 tablet   0   . PROAIR HFA 108 (90 BASE) MCG/ACT inhaler      INHALE 2 PUFFS FOUR TIMES A DAY AS NEEDED   18 g   10   . tiotropium (SPIRIVA) 18 MCG inhalation capsule   Inhalation   Place 1 capsule (18 mcg total) into inhaler and inhale daily.   30 capsule   2     BP 121/75  Temp(Src) 98.6 F (37 C) (Oral)  Resp 16  SpO2 100%  Physical Exam  Constitutional: She appears  well-developed and well-nourished.  HENT:  Head: Normocephalic and atraumatic. Head is without raccoon's eyes and without Battle's sign.  Right Ear: No hemotympanum.  Left Ear: No hemotympanum.  Mouth/Throat: Oropharynx is clear and moist.  Eyes: Conjunctivae are normal. Pupils are equal, round, and reactive to light.  Neck: Neck supple. No tracheal deviation present.  Cardiovascular: Normal rate, regular rhythm and intact distal pulses.   Pulmonary/Chest: Effort normal and breath sounds normal. No stridor. She has no wheezes. She has no rales.  Abdominal: Soft. Bowel sounds are normal. There is no rebound and no guarding.  Musculoskeletal: Normal range of motion. She exhibits no edema.  Neurological: She has normal reflexes.  Skin: Skin is warm and dry. She is not diaphoretic.  Psychiatric: She has a normal mood and affect.    ED Course  Procedures (including critical care time)  Labs Reviewed  CBC WITH DIFFERENTIAL - Abnormal; Notable for the following:    RBC 3.82 (*)    All other components within normal limits  POCT I-STAT, CHEM 8 - Abnormal; Notable  for the following:    Sodium 129 (*)    Chloride 89 (*)    Calcium, Ion 1.11 (*)    All other components within normal limits  ETHANOL  URINE RAPID DRUG SCREEN (HOSP PERFORMED)  ACETAMINOPHEN LEVEL  SALICYLATE LEVEL  LACTIC ACID, PLASMA   No results found.   No diagnosis found.    MDM   Date: 11/27/2012  Rate: 92  Rhythm: normal sinus rhythm  QRS Axis: normal  Intervals: normal  ST/T Wave abnormalities: normal  Conduction Disutrbances: none  Narrative Interpretation: unremarkable         MDM Reviewed: nursing note and vitals Interpretation: labs and ECG Total time providing critical care: 30-74 minutes. This excludes time spent performing separately reportable procedures and services. Consults: pulmonary  CRITICAL CARE Performed by: Jasmine Awe   Total critical care time: 60 min  Critical care time was exclusive of separately billable procedures and treating other patients.  Critical care was necessary to treat or prevent imminent or life-threatening deterioration.  Critical care was time spent personally by me on the following activities: development of treatment plan with patient and/or surrogate as well as nursing, discussions with consultants, evaluation of patient's response to treatment, examination of patient, obtaining history from patient or surrogate, ordering and performing treatments and interventions, ordering and review of laboratory studies, ordering and review of radiographic studies, pulse oximetry and re-evaluation of patient's condition.    Jasmine Awe, MD 11/27/12 [phone]

## 2012-11-27 NOTE — H&P (Signed)
PULMONARY  / CRITICAL CARE MEDICINE  Name: Wendy [name] MRN: [id number] DOB: 09-Aug-1961    ADMISSION DATE:  11/26/2012 CONSULTATION DATE:  11/27/2012  REFERRING MD :  Terressa Koyanagi EDP  CHIEF COMPLAINT:  Intentional overdose  BRIEF PATIENT DESCRIPTION: 52 y/o female with heavy EtOH abuse and COPD was admitted from the [facility] ED on 3/13 for intentional overdose with Alprazolam.  SIGNIFICANT EVENTS / STUDIES:    LINES / TUBES:   CULTURES:   ANTIBIOTICS:   HISTORY OF PRESENT ILLNESS:  52 y/o female with heavy EtOH abuse and COPD was admitted from the [facility] ED on 3/13 for intentional overdose with Alprazolam. Her daughters state that she has never attempted suicide in the past but has been feeling depressed lately.  She drinks between 12 and 24 beers a day and drank 12 beers on 3/12.  She was found by her husband around 2200 on 3/12 on the floor of her bathroom, somnolent.  She admitted to taking a whole bottle of alprazolam 1mg  tablets.  Her family estimates that she took 39 tablets.     For several days prior to admission she had been complaining of R chest pain and increasing dyspnea.  A prednisone taper had been called in by Dr. Maple Hudson for this.   PAST MEDICAL HISTORY :  Past Medical History  Diagnosis Date  . Bronchitis   . Asthma   . Seasonal allergies   . Poor dentition   . Alcoholism   . COPD (chronic obstructive pulmonary disease)   . Depression    Past Surgical History  Procedure Laterality Date  . Cesarean section      x 2  . Tubal ligation     Prior to Admission medications   Medication Sig Start Date End Date Taking? Authorizing Provider  albuterol (PROVENTIL) (2.5 MG/3ML) 0.083% nebulizer solution Take 3 mLs (2.5 mg total) by nebulization 4 (four) times daily. 10/16/12 10/16/13 Yes Clinton D Young, MD  ALPRAZolam Prudy Feeler) 1 MG tablet Take 1 tablet (1 mg total) by mouth 3 (three) times daily as needed for sleep or anxiety. 08/19/12 08/19/13 Yes Clinton D Young, MD   fluticasone (FLONASE) 50 MCG/ACT nasal spray Place 2 sprays into the nose daily. 09/30/12 10/01/13 Yes Clinton D Young, MD  Fluticasone-Salmeterol (ADVAIR DISKUS) 500-50 MCG/DOSE AEPB Inhale 1 puff into the lungs 2 (two) times daily. 11/14/12  Yes Michele Mcalpine, MD  predniSONE (DELTASONE) 10 MG tablet Take 10-40 mg by mouth daily. Beginning 11/24/12 Tapered dose Take 4 daily for 2 days, then take 3 daily for 2 days, then take 2 daily for 2 days, then 1 daily til gone   Yes Historical Provider, MD  PROAIR HFA 108 (90 BASE) MCG/ACT inhaler INHALE 2 PUFFS FOUR TIMES A DAY AS NEEDED 04/24/12  Yes Waymon Budge, MD  tiotropium (SPIRIVA) 18 MCG inhalation capsule Place 1 capsule (18 mcg total) into inhaler and inhale daily. 09/04/12 09/04/13 Yes Waymon Budge, MD   Allergies  Allergen Reactions  . Fexofenadine     REACTION: nausea  . Other     PAIN MEDICATIONS-nausea    FAMILY HISTORY:  Family History  Problem Relation Age of Onset  . Lung cancer Father   . COPD Father    SOCIAL HISTORY:  reports that she quit smoking about 11 months ago. Her smoking use included Cigarettes. She has a 12 pack-year smoking history. She has never used smokeless tobacco. She reports that  drinks alcohol. Her drug history is not  on file.  REVIEW OF SYSTEMS:  Cannot obtain due to confusion  SUBJECTIVE:   VITAL SIGNS: Temp:  [98.6 F (37 C)] 98.6 F (37 C) (03/12 2345) Pulse Rate:  [75-81] 81 (03/13 0212) Resp:  [12-20] 20 (03/13 0212) BP: (86-121)/(60-76) 110/60 mmHg (03/13 0212) SpO2:  [91 %-100 %] 100 % (03/13 0212) HEMODYNAMICS:   VENTILATOR SETTINGS:   INTAKE / OUTPUT: Intake/Output   None     PHYSICAL EXAMINATION: Gen: sitting up in bed, no acute distress HEENT: NCAT, PERRL, EOMi, OP clear, neck supple without masses PULM: Exp wheezing bilaterally, good air movement, no accessory muscle use CV: RRR, no mgr, no JVD AB: BS+, soft, nontender, no hsm Ext: warm, no edema, no clubbing, no  cyanosis Derm: no rash or skin breakdown Neuro: Somnolent but arouses easily to voice, oriented to location, name, situation but not to year; moves all four ext well, follows commands (hold up 2 fingers). Gag intact per EDP   LABS:  Recent Labs Lab 11/26/12 2340 11/26/12 2341 11/27/12 0014  HGB 13.0  --  13.6  WBC 7.1  --   --   PLT 183  --   --   NA  --   --  129*  K  --   --  3.9  CL  --   --  89*  GLUCOSE  --   --  90  BUN  --   --  8  CREATININE  --   --  1.00  LATICACIDVEN  --  1.8  --    No results found for this basename: GLUCAP,  in the last 168 hours  3/13 CXR: Emphysema bilaterally, no infiltrate  ASSESSMENT / PLAN: NEUROLOGIC A:  Intentional benzodiazepine overdose Acute encephalopathy from overdose Chronic heavy alcohol abuse  P:   -ICU monitoring -banana bag -intubate if mental status worsens -no role for flumazenil with chronic benzo use -will definitely need schedule and prn benzo's on 3/13 if she is more awake, but hold order for now considering somnolence -psyche evaluation after more awake -suicide precautions  PULMONARY A: Acute exacerbation of COPD improving on prednisone, currently not in respiratory distress Somnolent but protecting airway Does not need intubation at this point, but high risk so ICU admission is reasonable P:   -change prednisone to solumedrol -scheduled and prn bronchodilators -monitor respiratory status closely in ICU, if less responsive or no gag will intubate  CARDIOVASCULAR A: No acute issues P:  -tele  RENAL A:  Mild hyponatremia, likely related to heavy beer intake P:   -repeat BMET, consider work up if Na still low  GASTROINTESTINAL A:  No acute issues P:   -npo  HEMATOLOGIC A:  No acute issues P:  -monitor for bleeding  INFECTIOUS A:  No acute issues P:     ENDOCRINE A:  No acute issues P:     TODAY'S SUMMARY: 52 y/o female with intentional benzodiazepine overdose in the setting of heavy  EtOH abuse.  Does not need intubation on 3/13, but needs ICU monitoring.  I have personally obtained a history, examined the patient, evaluated laboratory and imaging results, formulated the assessment and plan and placed orders. CRITICAL CARE: The patient is critically ill with multiple organ systems failure and requires high complexity decision making for assessment and support, frequent evaluation and titration of therapies, application of advanced monitoring technologies and extensive interpretation of multiple databases. Critical Care Time devoted to patient care services described in this note is 45 minutes.  Fonnie Jarvis [facility]monary and [facility] Care Medicine [facility] Pager: [phone]  11/27/2012, 2:16 AM

## 2012-11-27 NOTE — ED Notes (Signed)
Report called to carla [phone]

## 2012-11-27 NOTE — ED Notes (Signed)
Poison control contacted and the reply  Was supportive care only.  Will call back fo updates after her drug tests come back.  The pt has had  2 daughters come back to see her.  The pt is hard to arrouse.  The daughter reports that the pt  Eats very little and has been depressed for a while.  The pt is on home 02 for her copd and she cares for a son 74 that has downs syndrome there is a stepfather in the home also.

## 2012-11-27 NOTE — ED Notes (Signed)
The a-c notified of a need for a sitter for suicidal gesture

## 2012-11-28 DIAGNOSIS — T50901A Poisoning by unspecified drugs, medicaments and biological substances, accidental (unintentional), initial encounter: Secondary | ICD-10-CM

## 2012-11-28 DIAGNOSIS — R21 Rash and other nonspecific skin eruption: Secondary | ICD-10-CM | POA: Diagnosis present

## 2012-11-28 DIAGNOSIS — L408 Other psoriasis: Secondary | ICD-10-CM

## 2012-11-28 DIAGNOSIS — F329 Major depressive disorder, single episode, unspecified: Secondary | ICD-10-CM

## 2012-11-28 MED ORDER — HYDROCORTISONE 1 % EX CREA
TOPICAL_CREAM | Freq: Two times a day (BID) | CUTANEOUS | Status: DC
  Administered 2012-11-28: 21:00:00 via TOPICAL
  Administered 2012-11-28: 1 via TOPICAL
  Administered 2012-11-29 – 2012-12-01 (×5): via TOPICAL
  Filled 2012-11-28 (×2): qty 28

## 2012-11-28 MED ORDER — ALBUTEROL SULFATE (5 MG/ML) 0.5% IN NEBU
2.5000 mg | INHALATION_SOLUTION | RESPIRATORY_TRACT | Status: DC | PRN
  Filled 2012-11-28 (×2): qty 0.5

## 2012-11-28 MED ORDER — IPRATROPIUM BROMIDE 0.02 % IN SOLN
0.5000 mg | RESPIRATORY_TRACT | Status: DC | PRN
  Administered 2012-11-29 – 2012-11-30 (×3): 0.5 mg via RESPIRATORY_TRACT
  Filled 2012-11-28 (×3): qty 2.5

## 2012-11-28 MED ORDER — ALBUTEROL SULFATE (5 MG/ML) 0.5% IN NEBU
2.5000 mg | INHALATION_SOLUTION | RESPIRATORY_TRACT | Status: DC | PRN
  Administered 2012-11-28 – 2012-12-01 (×7): 2.5 mg via RESPIRATORY_TRACT
  Filled 2012-11-28 (×4): qty 0.5

## 2012-11-28 MED ORDER — CLOBETASOL PROPIONATE 0.05 % EX CREA
TOPICAL_CREAM | Freq: Two times a day (BID) | CUTANEOUS | Status: DC
  Administered 2012-11-28: 1 via TOPICAL
  Administered 2012-11-28 – 2012-12-01 (×6): via TOPICAL
  Filled 2012-11-28 (×2): qty 15

## 2012-11-28 MED ORDER — TROLAMINE SALICYLATE 10 % EX CREA: TOPICAL_CREAM | Freq: Two times a day (BID) | CUTANEOUS | Status: DC

## 2012-11-28 MED ORDER — GRX ANALGESIC BALM EX OINT
1.0000 "application " | TOPICAL_OINTMENT | Freq: Two times a day (BID) | CUTANEOUS | Status: DC
  Administered 2012-11-28 – 2012-11-29 (×4): 1 via CUTANEOUS
  Filled 2012-11-28 (×3): qty 28

## 2012-11-28 MED ORDER — PREDNISONE 20 MG PO TABS
40.0000 mg | ORAL_TABLET | Freq: Every day | ORAL | Status: DC
  Administered 2012-11-29 – 2012-12-01 (×3): 40 mg via ORAL
  Filled 2012-11-28 (×6): qty 2

## 2012-11-28 NOTE — Progress Notes (Addendum)
Pt arrived from 2100, A&O; c/o dinner, room, general agitation; will continue to monitor.  Dr. Ferol Luz rec d/c suicide prec, paged Dr Butler Denmark, new order to d/c.

## 2012-11-28 NOTE — Progress Notes (Signed)
Clinical Social Worker attempted to see pt; pt currently sleeping and does not awake to her name being called.  Sitter at bedside.  CSW attempted to phone dtr at number listed in chart.  Message came on stating the phone number does not accept incoming calls.  CSW to continue to follow and assist as needed.   Angelia Mould, MSW, [place] [phone]

## 2012-11-28 NOTE — Progress Notes (Signed)
[facility] Progress Note Wendy [name] TEAM 1 - Stepdown/ICU TEAM   Wendy [name] WUJ:[id number] DOB: [birth year] DOA: 11/26/2012 PCP: Mia Creek, MD  Brief narrative: This is a 52 year old female with a history of alcohol abuse and COPD who presents with overdose of alprazolam. She was found by her husband around 10 PM on 3/12 on the bathroom floor, somnolent. She told him she finished the bottle of alprazolam which was filled with 1 mg tabs. Her family estimated that she took 39 tabs. Apparently the patient had also had a cold with a cough and had been complaining of some right-sided chest pain which was quite severe. She was admitted to the ICU. She did not require intubation and awoke the following day. She was transferred to the care of [facility] on 3/14.  Assessment/Plan: Principal Problem:   Overdose of benzodiazepine/anxiety Awaiting psychiatric eval-will need better control of anxiety issues  Active Problems:   COPD with chronic bronchitis Switch Solu-Medrol to prednisone, change nebulizer treatments to when necessary as this is the way she uses her inhaler and nebs at home and currently is not wheezing    Alcohol withdrawal Continue CIWA scale  Rash Appears to be psoriasis-I've prescribed 2 different types of steroid creams, the milder being for intertriginous areas-will need dermatology followup eventually   Code Status: Full code Family Communication: None Disposition Plan: Transfer to step Consultants: Psychiatry  Procedures: None  Antibiotics: None  DVT prophylaxis: Lovenox  HPI/Subjective: Patient is alert and complains of a cough which is nonproductive. No significant dyspnea. She admits to taking the Xanax because she was upset about her right-sided chest pain and an argument she had with her husband. She also states that she was on prednisone at that time which was making her feel jittery and more anxious.   Overall she states that she  is not depressed but often feels anxious and takes Xanax 3 times a day. However, she admits that sometimes this amount of Xanax is not sufficient to control her anxiety. She feels better currently as she is receiving Ativan but still feels some anxiety. No complaints of hallucinations. Mild dizziness when she gets up to the commode at bedside. Pain in the right side of the chest changes with movement of her right arm and is painful when palpated. He also complains of a rash which is present on her legs back abdomen and under her breasts-it has been there for many months and is often very itchy.   Objective: Blood pressure 136/59, pulse 97, temperature 98.2 F (36.8 C), temperature source Oral, resp. rate 14, height 5\' 1"  (1.549 m), weight 55.6 kg (122 lb 9.2 oz), SpO2 100.00%.  Intake/Output Summary (Last 24 hours) at 11/28/12 1706 Last data filed at 11/28/12 1658  Gross per 24 hour  Intake   1310 ml  Output    575 ml  Net    735 ml     Exam: General: Awake alert oriented x3, No acute respiratory distress Lungs: Clear to auscultation bilaterally without wheezes or crackles Cardiovascular: Regular rate and rhythm without murmur gallop or rub normal S1 and S2 Abdomen: Nontender, nondistended, soft, bowel sounds positive, no rebound, no ascites, no appreciable mass Extremities: No significant cyanosis, clubbing, or edema bilateral lower extremities Skin: Large plaques of erythematous raised skin with grayish white scaling present on the outer aspects of both legs, under breasts, sacral area and gluteal cleft, and periumbilical area.  Data Reviewed: Basic Metabolic Panel:  Recent Labs Lab 11/27/12  0014 11/27/12 0450  NA 129* 135  K 3.9 3.6  CL 89* 97  CO2  --  25  GLUCOSE 90 84  BUN 8 8  CREATININE 1.00 0.50  CALCIUM  --  8.3*  MG  --  1.8  PHOS  --  3.6   Liver Function Tests: No results found for this basename: AST, ALT, ALKPHOS, BILITOT, PROT, ALBUMIN,  in the last 168  hours No results found for this basename: LIPASE, AMYLASE,  in the last 168 hours No results found for this basename: AMMONIA,  in the last 168 hours CBC:  Recent Labs Lab 11/26/12 2340 11/27/12 0014 11/27/12 0450  WBC 7.1  --  5.1  NEUTROABS 5.2  --   --   HGB 13.0 13.6 10.9*  HCT 36.2 40.0 30.8*  MCV 94.8  --  95.1  PLT 183  --  160   Cardiac Enzymes: No results found for this basename: CKTOTAL, CKMB, CKMBINDEX, TROPONINI,  in the last 168 hours BNP (last 3 results) No results found for this basename: PROBNP,  in the last 8760 hours CBG: No results found for this basename: GLUCAP,  in the last 168 hours  Recent Results (from the past 240 hour(s))  MRSA PCR SCREENING     Status: None   Collection Time    11/27/12  3:35 AM      Result Value Range Status   MRSA by PCR NEGATIVE  NEGATIVE Final   Comment:            The GeneXpert MRSA Assay (FDA     approved for NASAL specimens     only), is one component of a     comprehensive MRSA colonization     surveillance program. It is not     intended to diagnose MRSA     infection nor to guide or     monitor treatment for     MRSA infections.     Studies:  Recent x-ray studies have been reviewed in detail by the Attending Physician  Scheduled Meds:  Scheduled Meds: . clobetasol cream   Topical BID  . enoxaparin (LOVENOX) injection  40 mg Subcutaneous Q24H  . fluticasone  2 spray Each Nare Daily  . folic acid  1 mg Oral Daily  . GRX ANALGESIC BALM  1 application Apply externally BID  . hydrocortisone cream   Topical BID  . LORazepam  0-4 mg Oral Q6H   Followed by  . [START ON 11/29/2012] LORazepam  0-4 mg Oral Q12H  . methylPREDNISolone (SOLU-MEDROL) injection  60 mg Intravenous Q12H  . mometasone-formoterol  2 puff Inhalation BID  . multivitamin with minerals  1 tablet Oral Daily  . thiamine  100 mg Oral Daily  . tiotropium  18 mcg Inhalation Daily   Continuous Infusions:   Time spent on care of this patient:    [facility]  [facility] Office  [phone] Pager - Text Page per Loretha Stapler as per below:  On-Call/Text Page:      [email or web address]      password [email or web address]  If 7PM-7AM, please contact night-coverage [email or web address] Password [email or web address] 11/28/2012, 5:06 PM   LOS: 2 days

## 2012-11-28 NOTE — Clinical Social Work Psych Assess (Signed)
Clinical Social Work Department CLINICAL SOCIAL WORK PSYCHIATRY SERVICE LINE ASSESSMENT 11/28/2012  Patient:  Wendy [name]  Account:  [id number]  Admit Date:  11/26/2012  Clinical Social Worker:  Margaree Mackintosh  Date/Time:  11/28/2012 03:49 PM Referred by:  Physician  Date referred:  11/28/2012 Reason for Referral  Behavioral Health Issues   Presenting Symptoms/Problems (In the persons/familys own words):   "I got really stressed out and took a bunch of pills...xanex".   Abuse/Neglect/Trauma History (check all that apply)  Denies history   Abuse/Neglect/Trauma Comments:   Psychiatric History (check all that apply)  Denies history   Psychiatric medications:  Current Mental Health Hospitalizations/Previous Mental Health History:   Current provider:   Place and Date:   Current Medications:   Previous Impatient Admission/Date/Reason:   Emotional Health / Current Symptoms    Suicide/Self Harm  Suicide attempt in past (date/description)   Suicide attempt in the past:   March 12,2014-OD on Xanex.   Other harmful behavior:   Psychotic/Dissociative Symptoms  None reported   Other Psychotic/Dissociative Symptoms:   Pt denies.    Attention/Behavioral Symptoms  Impulsive   Other Attention / Behavioral Symptoms:    Cognitive Impairment  Poor Judgement   Other Cognitive Impairment:    Mood and Adjustment  DEPRESSION    Stress, Anxiety, Trauma, Any Recent Loss/Stressor  Relationship   Anxiety (frequency):   Phobia (specify):   Compulsive behavior (specify):   Obsessive behavior (specify):   Other:   Relationship with family; family disagrees with pt's drinking.   Substance Abuse/Use  Current substance use   SBIRT completed (please refer for detailed history):  Y  Self-reported substance use:   11/26/12.   Urinary Drug Screen Completed:  Y Alcohol level:    Environmental/Housing/Living Arrangement  With Family Member   Who is in the  home:   Son, Philip-age 71 (mom reports diagnosis of Down Syndrome).  Boyfriend resides in home "part-time".   Emergency contact:  Financial  Medicare  Medicaid   Patients Strengths and Goals (patients own words):   Clinical Social Workers Interpretive Summary:   Visual merchandiser met with pt at bedside; sitter present.  CSW introduced self, explained role, and provided support.  CSW provided active listening.  Pt sitting upright in bed, hair braided, and is drinking [employer] Sweet Tea that her dtr brought in.  Pt is tearful during intervention, but appears to communicate openly and honestly.    Pt reports, "I just got really stressed out, and I wanted it to be better, I wanted my family to know what it would be like if I wasn't here".  Pt reports herself as the "person who holds the family together" and often copes through use of Alcohol-"about 6 beers a day".  Pt denies any current or past psych history and states she has never seen a therapist in the community.  Pt states she is able to cease alcohol consumption on her own and is not open to resources at this time, but is open to "considering" resources.  Pt denies any current suicidal thoughts/ideations, denies any current or past homicidal ideations, and denies Visual or Auditorial hallucinations. CSW attempted to complete safety contract with pt, pt unable to identify resources other than phoning her two dtrs.  When asked what would you do if your dtrs did not answer their phone, pt stated, "I don't know".  CSW provided 24 hour crisis line to pt.   Disposition:  Recommend Psych CSW  continuing to support while in hospital

## 2012-11-29 MED ORDER — GUAIFENESIN ER 600 MG PO TB12
600.0000 mg | ORAL_TABLET | Freq: Two times a day (BID) | ORAL | Status: DC
  Administered 2012-11-29 – 2012-12-01 (×5): 600 mg via ORAL
  Filled 2012-11-29 (×7): qty 1

## 2012-11-29 NOTE — Progress Notes (Signed)
[facility] Progress Note [place] TEAM 1 - Stepdown/ICU TEAM   Wendy [name] ZOX:[id number] DOB: 01-19-1961 DOA: 11/26/2012 PCP: Mia Creek, MD  Brief narrative: This is a 52 year old female with a history of alcohol abuse and COPD who presents with overdose of alprazolam. She was found by her husband around 10 PM on 3/12 on the bathroom floor, somnolent. She told him she finished the bottle of alprazolam which was filled with 1 mg tabs. Her family estimated that she took 39 tabs. Apparently the patient had also had a cold with a cough and had been complaining of some right-sided chest pain which was quite severe. She was admitted to the ICU. She did not require intubation and awoke the following day. She was transferred to the care of [facility] on 3/14.  Assessment/Plan: Principal Problem:   Overdose of benzodiazepine/anxiety -Appreciate psychiatric eval -sitter discontinued- pt declines transfer to inpatient psych -will need better control of anxiety issues  Active Problems:   COPD with chronic bronchitis -Cont prednisone, Spiriva - add flutter valve and mucinex - cont nebulizer treatments PRN    Alcohol withdrawal Continue CIWA scale  Rash/Psoriasis -Pt not aware that she has a diagnosis of psoriasis -Apparently was admitted many yrs ago and received topical medication with relieved the rash at that time but has never seen a dermatologist -I've prescribed 2 different types of steroid creams, the milder being for intertriginous areas-will need dermatology followup eventually   Code Status: Full code Family Communication: None Disposition Plan: Transfer to tele Consultants: Psychiatry  Procedures: None  Antibiotics: None  DVT prophylaxis: Lovenox  HPI/Subjective: Patient moderately anxious- chest is congested but not able to cough up mucous- rash is "getting better"    Objective: Blood pressure 181/85, pulse 83, temperature 98.1 F (36.7  C), temperature source Oral, resp. rate 15, height 5\' 1"  (1.549 m), weight 55.6 kg (122 lb 9.2 oz), SpO2 100.00%.  Intake/Output Summary (Last 24 hours) at 11/29/12 1453 Last data filed at 11/29/12 1152  Gross per 24 hour  Intake    500 ml  Output   1950 ml  Net  -1450 ml     Exam: General: Awake alert oriented x3, No acute respiratory distress Lungs: Clear to auscultation bilaterally without wheezes or crackles Cardiovascular: Regular rate and rhythm without murmur gallop or rub normal S1 and S2 Abdomen: Nontender, nondistended, soft, bowel sounds positive, no rebound, no ascites, no appreciable mass Extremities: No significant cyanosis, clubbing, or edema bilateral lower extremities Skin: Large plaques of erythematous raised skin with grayish white scaling present on the outer aspects of both legs, under breasts, sacral area and gluteal cleft, and periumbilical area.  Data Reviewed: Basic Metabolic Panel:  Recent Labs Lab 11/27/12 0014 11/27/12 0450  NA 129* 135  K 3.9 3.6  CL 89* 97  CO2  --  25  GLUCOSE 90 84  BUN 8 8  CREATININE 1.00 0.50  CALCIUM  --  8.3*  MG  --  1.8  PHOS  --  3.6   Liver Function Tests: No results found for this basename: AST, ALT, ALKPHOS, BILITOT, PROT, ALBUMIN,  in the last 168 hours No results found for this basename: LIPASE, AMYLASE,  in the last 168 hours No results found for this basename: AMMONIA,  in the last 168 hours CBC:  Recent Labs Lab 11/26/12 2340 11/27/12 0014 11/27/12 0450  WBC 7.1  --  5.1  NEUTROABS 5.2  --   --   HGB 13.0  13.6 10.9*  HCT 36.2 40.0 30.8*  MCV 94.8  --  95.1  PLT 183  --  160   Cardiac Enzymes: No results found for this basename: CKTOTAL, CKMB, CKMBINDEX, TROPONINI,  in the last 168 hours BNP (last 3 results) No results found for this basename: PROBNP,  in the last 8760 hours CBG: No results found for this basename: GLUCAP,  in the last 168 hours  Recent Results (from the past 240 hour(s))   MRSA PCR SCREENING     Status: None   Collection Time    11/27/12  3:35 AM      Result Value Range Status   MRSA by PCR NEGATIVE  NEGATIVE Final   Comment:            The GeneXpert MRSA Assay (FDA     approved for NASAL specimens     only), is one component of a     comprehensive MRSA colonization     surveillance program. It is not     intended to diagnose MRSA     infection nor to guide or     monitor treatment for     MRSA infections.     Studies:  Recent x-ray studies have been reviewed in detail by the Attending Physician  Scheduled Meds:  Scheduled Meds: . clobetasol cream   Topical BID  . enoxaparin (LOVENOX) injection  40 mg Subcutaneous Q24H  . fluticasone  2 spray Each Nare Daily  . GRX ANALGESIC BALM  1 application Apply externally BID  . guaiFENesin  600 mg Oral BID  . hydrocortisone cream   Topical BID  . LORazepam  0-4 mg Oral Q12H  . mometasone-formoterol  2 puff Inhalation BID  . multivitamin with minerals  1 tablet Oral Daily  . predniSONE  40 mg Oral Q breakfast  . tiotropium  18 mcg Inhalation Daily   Continuous Infusions:   Time spent on care of this patient:   [facility]  [facility] Office  [phone] Pager - Text Page per Loretha Stapler as per below:  On-Call/Text Page:      [email or web address]      password [email or web address]  If 7PM-7AM, please contact night-coverage [email or web address] Password [email or web address] 11/29/2012, 2:53 PM   LOS: 3 days

## 2012-11-29 NOTE — Progress Notes (Signed)
Pt to TX to [id number], VSS, called report.

## 2012-11-29 NOTE — Progress Notes (Signed)
Patient arrived via wheelchair to unit, applied 2lpm of 02 nasal, alert and oriented, eating dinner, instructed to let staff know when drinking fluids due to strict I&O.  NSL to left forearm, no c/o of discomfort or pain.  Respirations normal.  Non Tele monitoring on this unit.  Educated to unit and will introduce next staff when they arrive.

## 2012-11-29 NOTE — Consult Note (Signed)
Patient Identification:  Wendy [name] Date of Evaluation:  11/29/2012 Reason for Consult: Overdose, Suicide attempt  Referring Provider: Dr. Butler Denmark  History of Present Illness:Wendy [name] is a 52 yo woman who lives 20+ years with Wendy [name].  She has 2 girls, 2 boys.  She developed COPD 3 years ago and has disability.  Wendy [name] has not understood her COPD and has verbally harassed her about 'not pulling her weight'.  He has done cleaning, cooking and laundry.  She became so distressed about his verbal abuse that she went into the bathroom and took a 1/2 bottle of Xanax.  She was brought to the ED, [facility].  She had an elevated BAL and positive tox screen for benzodiazepines.   Past Psychiatric History:she says she has never gone to a psychiatrist  Her medications come from PCP.  She defers to go to a therapist .  She has been drinking alcohol and takes benzodiazepines.  She has a son with Down's Syndrome.  He is an adult and has a job.  Past Medical History:     Past Medical History  Diagnosis Date  . Bronchitis   . Asthma   . Seasonal allergies   . Poor dentition   . Alcoholism   . COPD (chronic obstructive pulmonary disease)   . Depression   . Oxygen dependent     home oxygen 2L/min       Past Surgical History  Procedure Laterality Date  . Cesarean section      x 2  . Tubal ligation      Allergies:  Allergies  Allergen Reactions  . Fexofenadine     REACTION: nausea  . Other     PAIN MEDICATIONS-nausea    Current Medications:  Prior to Admission medications   Medication Sig Start Date End Date Taking? Authorizing Provider  albuterol (PROVENTIL) (2.5 MG/3ML) 0.083% nebulizer solution Take 3 mLs (2.5 mg total) by nebulization 4 (four) times daily. 10/16/12 10/16/13 Yes Clinton D Young, MD  ALPRAZolam Prudy Feeler) 1 MG tablet Take 1 tablet (1 mg total) by mouth 3 (three) times daily as needed for sleep or anxiety. 08/19/12 08/19/13 Yes Clinton D Young, MD  fluticasone (FLONASE) 50  MCG/ACT nasal spray Place 2 sprays into the nose daily. 09/30/12 10/01/13 Yes Clinton D Young, MD  Fluticasone-Salmeterol (ADVAIR DISKUS) 500-50 MCG/DOSE AEPB Inhale 1 puff into the lungs 2 (two) times daily. 11/14/12  Yes Michele Mcalpine, MD  predniSONE (DELTASONE) 10 MG tablet Take 10-40 mg by mouth daily. Beginning 11/24/12 Tapered dose Take 4 daily for 2 days, then take 3 daily for 2 days, then take 2 daily for 2 days, then 1 daily til gone   Yes Historical Provider, MD  PROAIR HFA 108 (90 BASE) MCG/ACT inhaler INHALE 2 PUFFS FOUR TIMES A DAY AS NEEDED 04/24/12  Yes Waymon Budge, MD  tiotropium (SPIRIVA) 18 MCG inhalation capsule Place 1 capsule (18 mcg total) into inhaler and inhale daily. 09/04/12 09/04/13 Yes Waymon Budge, MD    Social History:    reports that she has been smoking Cigarettes.  She has a 40 pack-year smoking history. She has never used smokeless tobacco. She reports that she drinks about 9.0 ounces of alcohol per week. She reports that she does not use illicit drugs.   Family History:    Family History  Problem Relation Age of Onset  . Lung cancer Father   . COPD Father     Mental Status Examination/Evaluation: Objective:  Appearance: Casual and Neat  Eye Contact::  Good  Speech:  Clear and Coherent and Normal Rate  Volume:  Normal  Mood:  Calm, encouraged about Wendyo.'s mood  Affect:  Appropriate, Blunt and Congruent  Thought Process:  Coherent, Goal Directed and Logical  Orientation:  Full (Time, Place, and Person)  Thought Content:  Rumination  Suicidal Thoughts:  No  Homicidal Thoughts:  No  Judgement:  Impaired  Insight:  Lacking   DIAGNOSIS:   AXIS I  Major Depressive Disorder with suicide attempt.  Alochol Dependence, Benzo overdose  AXIS II  Deferred  AXIS III See medical notes.  AXIS IV educational problems, other psychosocial or environmental problems, problems related to social environment, problems with primary support group and Pt with COPD is  dependent upon help.  Oxygen demands are to great for household work.  Family needs to understand   AXIS V 41-50 serious symptoms   Assessment/Plan:  Psych CSW Pt participates in MSE  She states the date, recalls 2 of 3 items/5 min., understand proverb with abstract reasoning.  Spells world forwards and backwards.  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  She uses Dispensing optician for a situation.  Her family is angry with her.  She believes Wendy [name] has started to realize how ill she is.  She has not intention of trying to kill herself now.   Her husband is one who is  Wendy [name], is good and drinks 6-8 beers, can become verbally abusive.  RECOMMENDATION:  1,  Pt has capacity 2.  Pt has no concept of psychiatric help; she declines inpatient transfer.  3.  Considering the gravity of her overdose and the situation; suggest transfer to inpatient psycjiatric facility; at very least intensive outpatient therapy. 4.  Will follow pt. Miguelina Fore MD 11/29/2012 1:00 AM

## 2012-11-30 MED ORDER — MUSCLE RUB 10-15 % EX CREA
TOPICAL_CREAM | Freq: Two times a day (BID) | CUTANEOUS | Status: DC
  Administered 2012-11-30 – 2012-12-01 (×2): via TOPICAL
  Filled 2012-11-30: qty 85

## 2012-11-30 MED ORDER — KETOROLAC TROMETHAMINE 15 MG/ML IJ SOLN
15.0000 mg | Freq: Four times a day (QID) | INTRAMUSCULAR | Status: DC | PRN
  Administered 2012-11-30 (×2): 15 mg via INTRAVENOUS
  Filled 2012-11-30 (×2): qty 1

## 2012-11-30 MED ORDER — VITAMIN B-1 100 MG PO TABS
100.0000 mg | ORAL_TABLET | Freq: Every day | ORAL | Status: DC
  Administered 2012-12-01: 100 mg via ORAL
  Filled 2012-11-30 (×2): qty 1

## 2012-11-30 NOTE — Progress Notes (Signed)
PATIENT DETAILS Name: Wendy [name] Age: 52 y.o. Sex: female Date of Birth: [birth year] Admit Date: 11/26/2012 Admitting Physician Lupita Leash, MD UJW:JXBJYN, DAVID C, MD  Subjective: Complaining of mild pain in the right chest/abd area  Assessment/Plan: Principal Problem:  Overdose of benzodiazepine/anxiety  -Appreciate psychiatric eval -does not want to transfer to inpatient psych-per last psych note-patient has capacity -will need better control on anxiety issues-currently on Ativan per CIWA protocol-will d/w psych tomorrow  Active Problems:   Alcohol withdrawal -awake and alert -very minimally tremulous -c/w Ativan per protocol -c/w MVI/Thiamine  COPD with chronic bronchitis  -seems to be stable -Cont prednisone, Spiriva  - add flutter valve and mucinex  - cont nebulizer treatments PRN -on home O2  Rash/Psoriasis -c/w topical steroids  Disposition: Remain inpatient  DVT Prophylaxis: Prophylactic Lovenox   Code Status: Full code   Procedures:  None  CONSULTS:  pulmonary/intensive care and psychiatry  PHYSICAL EXAM: Vital signs in last 24 hours: Filed Vitals:   11/29/12 1844 11/29/12 2131 11/30/12 0626 11/30/12 0815  BP: 153/93 145/92 151/80   Pulse: 73 86 79   Temp: 98.5 F (36.9 C) 98.6 F (37 C) 97.5 F (36.4 C)   TempSrc: Oral Oral Oral   Resp: 18 18 18    Height:      Weight:   55.8 kg (123 lb 0.3 oz)   SpO2: 100% 100% 99% 98%    Weight change:  Body mass index is 23.26 kg/(m^2).   Gen Exam: Awake and alert with clear speech.   Neck: Supple, No JVD.   Chest: B/L Clear.  Few scattered rhonchi bilaterally CVS: S1 S2 Regular, no murmurs.  Abdomen: soft, BS +, non tender, non distended. Extremities: no edema, lower extremities warm to touch. Neurologic: Non Focal.   Skin: No Rash.  Wounds: N/A.    Intake/Output from previous day:  Intake/Output Summary (Last 24 hours) at 11/30/12 1034 Last data filed at 11/30/12 0921  Gross per 24 hour  Intake    340 ml  Output   4600 ml  Net  -4260 ml     LAB RESULTS: CBC  Recent Labs Lab 11/26/12 2340 11/27/12 0014 11/27/12 0450  WBC 7.1  --  5.1  HGB 13.0 13.6 10.9*  HCT 36.2 40.0 30.8*  PLT 183  --  160  MCV 94.8  --  95.1  MCH 34.0  --  33.6  MCHC 35.9  --  35.4  RDW 11.9  --  12.1  LYMPHSABS 1.3  --   --   MONOABS 0.7  --   --   EOSABS 0.0  --   --   BASOSABS 0.0  --   --     Chemistries   Recent Labs Lab 11/27/12 0014 11/27/12 0450  NA 129* 135  K 3.9 3.6  CL 89* 97  CO2  --  25  GLUCOSE 90 84  BUN 8 8  CREATININE 1.00 0.50  CALCIUM  --  8.3*  MG  --  1.8    CBG: No results found for this basename: GLUCAP,  in the last 168 hours  GFR Estimated Creatinine Clearance: 60.7 ml/min (by C-G formula based on Cr of 0.5).  Coagulation profile No results found for this basename: INR, PROTIME,  in the last 168 hours  Cardiac Enzymes No results found for this basename: CK, CKMB, TROPONINI, MYOGLOBIN,  in the last 168 hours  No components found with this basename: POCBNP,  No results found for  this basename: DDIMER,  in the last 72 hours No results found for this basename: HGBA1C,  in the last 72 hours No results found for this basename: CHOL, HDL, LDLCALC, TRIG, CHOLHDL, LDLDIRECT,  in the last 72 hours No results found for this basename: TSH, T4TOTAL, FREET3, T3FREE, THYROIDAB,  in the last 72 hours No results found for this basename: VITAMINB12, FOLATE, FERRITIN, TIBC, IRON, RETICCTPCT,  in the last 72 hours No results found for this basename: LIPASE, AMYLASE,  in the last 72 hours  Urine Studies No results found for this basename: UACOL, UAPR, USPG, UPH, UTP, UGL, UKET, UBIL, UHGB, UNIT, UROB, ULEU, UEPI, UWBC, URBC, UBAC, CAST, CRYS, UCOM, BILUA,  in the last 72 hours  MICROBIOLOGY: Recent Results (from the past 240 hour(s))  MRSA PCR SCREENING     Status: None   Collection Time    11/27/12  3:35 AM      Result Value Range  Status   MRSA by PCR NEGATIVE  NEGATIVE Final   Comment:            The GeneXpert MRSA Assay (FDA     approved for NASAL specimens     only), is one component of a     comprehensive MRSA colonization     surveillance program. It is not     intended to diagnose MRSA     infection nor to guide or     monitor treatment for     MRSA infections.    RADIOLOGY STUDIES/RESULTS: Dg Chest Port 1 View  11/27/2012  *RADIOLOGY REPORT*  Clinical Data: Overdose, shortness of breath, altered mental status  PORTABLE CHEST - 1 VIEW  Comparison: Portable exam 0106 hours compared to 10/16/2012  Findings: Normal heart size, mediastinal contours, and pulmonary vascularity. Calcified granulomata in medial right lower lobe again noted. Lungs hyperinflated without acute infiltrate, pleural effusion, or pneumothorax. Bones unremarkable.  IMPRESSION: Old granulomatous disease and chronic hyperinflation. No acute abnormalities.   Original Report Authenticated By: Ulyses Southward, M.D.     MEDICATIONS: Scheduled Meds: . clobetasol cream   Topical BID  . enoxaparin (LOVENOX) injection  40 mg Subcutaneous Q24H  . fluticasone  2 spray Each Nare Daily  . GRX ANALGESIC BALM  1 application Apply externally BID  . guaiFENesin  600 mg Oral BID  . hydrocortisone cream   Topical BID  . mometasone-formoterol  2 puff Inhalation BID  . multivitamin with minerals  1 tablet Oral Daily  . predniSONE  40 mg Oral Q breakfast  . tiotropium  18 mcg Inhalation Daily   Continuous Infusions:  PRN Meds:.sodium chloride, albuterol, albuterol, ipratropium, ketorolac, LORazepam, LORazepam  Antibiotics: Anti-infectives   None       Jeoffrey Massed, MD  [facility] Pager:[phone]  If 7PM-7AM, please contact night-coverage [email or web address] Password [facility] 11/30/2012, 10:34 AM   LOS: 4 days

## 2012-12-01 DIAGNOSIS — F1010 Alcohol abuse, uncomplicated: Secondary | ICD-10-CM | POA: Diagnosis present

## 2012-12-01 MED ORDER — IBUPROFEN 200 MG PO TABS
600.0000 mg | ORAL_TABLET | Freq: Four times a day (QID) | ORAL | Status: DC | PRN
Start: 2012-12-01 — End: 2013-12-07

## 2012-12-01 MED ORDER — BUSPIRONE HCL 5 MG PO TABS: 5.0000 mg | ORAL_TABLET | Freq: Three times a day (TID) | ORAL | Status: DC

## 2012-12-01 MED ORDER — BUPROPION HCL ER (SR) 150 MG PO TB12: 150.0000 mg | ORAL_TABLET | Freq: Every day | ORAL | Status: DC

## 2012-12-01 NOTE — Progress Notes (Signed)
Patient eager to d/c home- states she wants to do f/u at [facility] outpatient and I have offered to schedule her an appointment but she wants to coordinate the time with her family. Advised Dr. Ferol Luz of Dr. Windell Norfolk request for follow up today for medication/treatment plan.  Reece Levy, MSW, Theresia Majors [phone]

## 2012-12-01 NOTE — Care Management Note (Signed)
    Page 1 of 1   12/01/2012     2:57:28 PM   CARE MANAGEMENT NOTE 12/01/2012  Patient:  MARTENA, EMANUELE   Account Number:  [id number]  Date Initiated:  11/27/2012  Documentation initiated by:  [facility]  Subjective/Objective Assessment:   intentional OD.  Lives with husband.     Action/Plan:   Anticipated DC Date:  12/01/2012   Anticipated DC Plan:  [facility]  In-house referral  Clinical Social Worker      DC Planning Services  CM consult      Choice offered to / List presented to:             Status of service:  Completed, signed off Medicare Important Message given?   (If response is "NO", the following Medicare IM given date fields will be blank) Date Medicare IM given:   Date Additional Medicare IM given:    Discharge Disposition:  HOME/SELF CARE  Per UR Regulation:  Reviewed for med. necessity/level of care/duration of stay  If discussed at Long Length of Stay Meetings, dates discussed:    Comments:  ContactTeandra, Harlan Daughter [phone]  12/01/12 14:54 Letha Cape RN, BSN [phone] patient dc to home , psych CSW following.  Patient states she has home oxygen.

## 2012-12-01 NOTE — Progress Notes (Signed)
1530 Discharge instructions reviewed with patient verbalize and understands. Skin with scattered bruises over abdomen , arms and legs with psoriasis. Left floor in wheelchair acompanied by staff.
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
## 2012-12-01 NOTE — Progress Notes (Signed)
Per Dr. Blenda Peals suggestion, appointment to be set up for patient at the [facility]- she wants to schedule this tomorrow after she checks with her family to confirm time for transportation to and from.   Patient advised of this and is agreeable to this plan. Reece Levy, MSW, Theresia Majors [phone]

## 2012-12-01 NOTE — Discharge Summary (Signed)
Physician Discharge Summary  Wendy [name] [facility] GNF:[id number] DOB: 1961/01/30 DOA: 11/26/2012  PCP: Mia Creek, MD  Admit date: 11/26/2012 Discharge date: 12/01/2012  Time spent: 45 minutes  Recommendations for Outpatient Follow-up:  [facility]ensive [facility] Therapy for Suicide Attempt and Heavy Alcohol Abuse.  [facility] for severe depression and suicide attempt.  Discharge Diagnoses:  Principal Problem:   Overdose of benzodiazepine Active Problems:   COPD with chronic bronchitis   Chronic rhinitis   Alcohol withdrawal   Rash and nonspecific skin eruption   Discharge Condition: stable.  Breathing easily.  Diet recommendation: regular as tolerated.  Filed Weights   11/28/12 0500 11/30/12 0626 12/01/12 0520  Weight: 55.6 kg (122 lb 9.2 oz) 55.8 kg (123 lb 0.3 oz) 56.246 kg (124 lb)    History of present illness:   This is a 52 year old female with a history of alcohol abuse and COPD who presents with suicide attempt via overdose of alprazolam. She was found by her husband around 10 PM on 3/12 on the bathroom floor, somnolent. She told him she finished the bottle of alprazolam which was filled with 1 mg tabs. Her family estimated that she took 39 tabs. Apparently the patient had also had a cold with a cough and had been complaining of some right-sided chest pain which was quite severe. She was admitted to the ICU. She did not require intubation and awoke coherent the following day.  Hospital Course:  Overdose of benzodiazepine/anxiety  The patient was admitted by critical care to the ICU as she was acutely encephalopathic and there was concern for her not being able to maintain her airway.  She was placed on IVF with a banana bag and CIWA protocol.  She awoke on 3/14, no longer encephalopathic and was able to be transferred out of the ICU.  Psychiatry was consulted and recommended inpatient rehabilitation.  The patient refused, but was judged to have capacity to  make her own decisions. The patient is agreeable to participating in [facility] Outpatient Intensive Therapy for alcohol and drug rehab and being seen at [facility] for depression and attempted suicide.  The patient was discharged on no benzodiazepines.  She was discharged on Wellbutrin 150 mg daily and Buspar 5 mg TID per Dr. Blenda Peals recommendations.  Alcohol Withdraw The patient was slightly tremulous and was monitored on CIWA protocol.  She did not display overt symptoms of alcohol withdraw.   COPD with chronic bronchitis  Stable, appears at baseline.  Was on a short course of outpatient prednisone prior to admission.  This was continued and will be tapered at discharge.  She will also continue spiriva, fluticasone, Proair HFA and Advair.  She has a follow up appointment scheduled with pulmonary in April.  Rash/Psoriasis  Continue with topical steroids outpatient.  Procedures:    Consultations:  Psychiatry  Discharge Exam: Filed Vitals:   11/30/12 1601 11/30/12 2030 11/30/12 2138 12/01/12 0520  BP:   162/99 147/96  Pulse:  82 83 90  Temp:   98.2 F (36.8 C) 98.3 F (36.8 C)  TempSrc:   Oral Oral  Resp:  18 18 18   Height:      Weight:    56.246 kg (124 lb)  SpO2: 99% 99% 100% 97%    General: A&O, NAD, on 2L of oxygen, Sitting on the side of the bed. Cardiovascular: rrr, no m/r/g, no lower ext. edema Respiratory: cta, no w/c/r, min increased respiratory effort Abdomen:  Soft, nt, nd, +BS, no masses  Skin:+ bruises on abdomen and rash in periumbilical area. Psych:  Tearful, but otherwise appropriate, cooperative, well groomed.  Discharge Instructions  Discharge Orders   Future Appointments Provider Department Dept Phone   12/26/2012 1:00 PM [facility] [phone]   12/26/2012 2:00 PM Waymon Budge, MD  [facility] Care [phone]   Future Orders Complete By Expires     Diet - low sodium heart healthy  As directed      Discharge instructions  As directed     Comments:      Patient to attend [facility] Outpatient Psychiatry Therapy, as well as follow up at [facility] [phone]    Increase activity slowly  As directed         Medication List    STOP taking these medications       ALPRAZolam 1 MG tablet  Commonly known as:  XANAX      TAKE these medications       buPROPion 150 MG 12 hr tablet  Commonly known as:  WELLBUTRIN SR  Take 1 tablet (150 mg total) by mouth daily.     busPIRone 5 MG tablet  Commonly known as:  BUSPAR  Take 1 tablet (5 mg total) by mouth 3 (three) times daily.     fluticasone 50 MCG/ACT nasal spray  Commonly known as:  FLONASE  Place 2 sprays into the nose daily.     Fluticasone-Salmeterol 500-50 MCG/DOSE Aepb  Commonly known as:  ADVAIR DISKUS  Inhale 1 puff into the lungs 2 (two) times daily.     ibuprofen 200 MG tablet  Commonly known as:  ADVIL  Take 3 tablets (600 mg total) by mouth every 6 (six) hours as needed for pain (side pain.  Use sparingly as NSAIDs can worsen your breathing.).     predniSONE 10 MG tablet  Commonly known as:  DELTASONE  Take 10-40 mg by mouth daily. Beginning 11/24/12 Tapered dose  Take 4 daily for 2 days, then take 3 daily for 2 days, then take 2 daily for 2 days, then 1 daily til gone     PROAIR HFA 108 (90 BASE) MCG/ACT inhaler  Generic drug:  albuterol  INHALE 2 PUFFS FOUR TIMES A DAY AS NEEDED     albuterol (2.5 MG/3ML) 0.083% nebulizer solution  Commonly known as:  PROVENTIL  Take 3 mLs (2.5 mg total) by nebulization 4 (four) times daily.     tiotropium 18 MCG inhalation capsule  Commonly known as:  SPIRIVA  Place 1 capsule (18 mcg total) into inhaler and inhale daily.           Follow-up Information   Follow up with [facility]. Call in 1 day.   Contact information:   [facility] [address] [place] [address] [phone]        The results of  significant diagnostics from this hospitalization (including imaging, microbiology, ancillary and laboratory) are listed below for reference.    Significant Diagnostic Studies: Dg Chest Port 1 View  11/27/2012  *RADIOLOGY REPORT*  Clinical Data: Overdose, shortness of breath, altered mental status  PORTABLE CHEST - 1 VIEW  Comparison: Portable exam 0106 hours compared to 10/16/2012  Findings: Normal heart size, mediastinal contours, and pulmonary vascularity. Calcified granulomata in medial right lower lobe again noted. Lungs hyperinflated without acute infiltrate, pleural effusion, or pneumothorax. Bones unremarkable.  IMPRESSION: Old granulomatous disease and chronic hyperinflation. No acute abnormalities.   Original Report  Authenticated By: Ulyses Southward, M.D.     Microbiology: Recent Results (from the past 240 hour(s))  MRSA PCR SCREENING     Status: None   Collection Time    11/27/12  3:35 AM      Result Value Range Status   MRSA by PCR NEGATIVE  NEGATIVE Final   Comment:            The GeneXpert MRSA Assay (FDA     approved for NASAL specimens     only), is one component of a     comprehensive MRSA colonization     surveillance program. It is not     intended to diagnose MRSA     infection nor to guide or     monitor treatment for     MRSA infections.     Labs: Basic Metabolic Panel:  Recent Labs Lab 11/27/12 0014 11/27/12 0450  NA 129* 135  K 3.9 3.6  CL 89* 97  CO2  --  25  GLUCOSE 90 84  BUN 8 8  CREATININE 1.00 0.50  CALCIUM  --  8.3*  MG  --  1.8  PHOS  --  3.6   CBC:  Recent Labs Lab 11/26/12 2340 11/27/12 0014 11/27/12 0450  WBC 7.1  --  5.1  NEUTROABS 5.2  --   --   HGB 13.0 13.6 10.9*  HCT 36.2 40.0 30.8*  MCV 94.8  --  95.1  PLT 183  --  160    SignedConley Canal [phone]  [facility] 12/01/2012, 2:39 PM   Attending  Patient seen and examined, agree with the assessment and plan as outlined above. She is  better-she still refuses to go to inpatient psych treatment, she has capacity per psych and patient is wanting to go to outpatient psych treatment which has been arranged by Psych Social worker,patient was seen by Dr Margarita Grizzle recommended [place] to use Welbutrin and Buspar to control her depression/Anxiety. Dr Ferol Luz personally made the recommendations to [place].  S Ghimire

## 2012-12-09 ENCOUNTER — Other Ambulatory Visit: Payer: Self-pay | Admitting: Internal Medicine

## 2012-12-26 ENCOUNTER — Ambulatory Visit: Payer: Medicaid Other | Admitting: Internal Medicine

## 2013-01-28 ENCOUNTER — Telehealth: Payer: Self-pay | Admitting: Internal Medicine

## 2013-01-28 MED ORDER — AZITHROMYCIN 250 MG PO TABS: 250.0000 mg | ORAL_TABLET | Freq: Every day | ORAL | Status: DC

## 2013-01-28 NOTE — Telephone Encounter (Signed)
Called, spoke with pt.  C/o left lung congestion, nasal congestion, increased SOB at rest and with exertion, a little PND, tightness in left chest that goes into her back, and states "it really hurts to take a deep breath."  Symptoms started x 3 days ago.  Also feels warm and has chills at times.  Using proair HFA with some relief.  Requesting rx to be sent to pharm.  Dr. Maple Hudson, pls advise.  Thank you.  Last OV with CDY: 10/16/12; asked to f/u in 1 mo Pending OV with CDY 02/13/13  [facility]   Allergies verified with pt: Allergies  Allergen Reactions  . Fexofenadine     REACTION: nausea  . Other     PAIN MEDICATIONS-nausea

## 2013-01-28 NOTE — Telephone Encounter (Signed)
See if a Zpak can help

## 2013-01-28 NOTE — Telephone Encounter (Signed)
Pt aware of Zpak Rx. Sent to [facility] [address]

## 2013-02-13 ENCOUNTER — Ambulatory Visit (INDEPENDENT_AMBULATORY_CARE_PROVIDER_SITE_OTHER): Payer: Medicare Other | Admitting: Internal Medicine

## 2013-02-13 ENCOUNTER — Encounter: Payer: Self-pay | Admitting: Internal Medicine

## 2013-02-13 VITALS — BP 122/76 | HR 118 | Ht 61.0 in | Wt 116.0 lb

## 2013-02-13 DIAGNOSIS — J449 Chronic obstructive pulmonary disease, unspecified: Secondary | ICD-10-CM

## 2013-02-13 DIAGNOSIS — J439 Emphysema, unspecified: Secondary | ICD-10-CM

## 2013-02-13 DIAGNOSIS — F17200 Nicotine dependence, unspecified, uncomplicated: Secondary | ICD-10-CM

## 2013-02-13 DIAGNOSIS — J438 Other emphysema: Secondary | ICD-10-CM

## 2013-02-13 LAB — PULMONARY FUNCTION TEST

## 2013-02-13 MED ORDER — ALBUTEROL SULFATE HFA 108 (90 BASE) MCG/ACT IN AERS: INHALATION_SPRAY | RESPIRATORY_TRACT | Status: DC

## 2013-02-13 MED ORDER — PREDNISONE (PAK) 10 MG PO TABS: ORAL_TABLET | ORAL | Status: DC

## 2013-02-13 MED ORDER — FLUTICASONE FUROATE-VILANTEROL 100-25 MCG/INH IN AEPB: 1.0000 | INHALATION_SPRAY | Freq: Every day | RESPIRATORY_TRACT | Status: DC

## 2013-02-13 NOTE — Progress Notes (Signed)
PFT done today. Pt was unable to perform lung volumes. 

## 2013-02-13 NOTE — Patient Instructions (Addendum)
Sample Breo Ellipta   1 puff, then rinse mouth, just once daily    Try this instead of Advair. When the sample runs out, go back to your Advair  You can use your Proair 2-3 puffs every 4 hours if needed- script  Script for prednisone taper

## 2013-02-13 NOTE — Progress Notes (Signed)
Patient ID: SIDNEE GAMBRILL, female    DOB: 03-13-1961, 52 y.o.   MRN: [id number] HPI 03/15/11- 41 yoF smoker followed for COPD, tobacco cessation support, rhinitis, complicated by hx anxiety Last here November 06, 2010- note reviewed. She has not smoked since December 2011.  Using her oxygen for the past week since she caught a cold with head and chest congestion, purulent sputum, hoarse. No fever or sore throat. Using her regular meds. Using her nebulizer regularly.  Had 7 teeth pulled in May.   08/16/11- 50 yoF smoker followed for COPD, tobacco cessation support, rhinitis, complicated by hx anxiety Has had flu vaccine. October 28 [facility] with acute bronchitis after catching a cold. Got better after treatment there but not well. We called in prednisone and a Z-Pak. She still feels some chest congestion, cough with clear mucus, feels tight. Denies fever, chest pain or blood. Has had oxygen for several months, used most of the time. Says she quit cigarettes this year by using an electronic cigarette with a mild insert. Nasal congestion especially left side. Occasional blood from left nostril. Chest x-ray: 07/15/2011-hyperinflation, no acute disease. I reviewed images.  02/25/12- 50 yoF smoker followed for COPD, tobacco cessation support, rhinitis, complicated by hx anxiety : c/o sob, congestion, and chest tightness. She is remaining off of cigarettes. Her COPD assessment test (CAT) score is 30/40 She had done well for the last several months as her last visit. Just 3 days ago and acute illness began as a gastroenteritis followed by shortness of breath, nonproductive cough and wheeze. She increased her oxygen. Using her nebulizer. Feels better today.  10/16/12- 51 yoF formersmoker followed for COPD, tobacco cessation support, rhinitis, complicated by hx anxiety follow-up: Pt c/o increased SOB with activity that has gradually been worse since last OV.  Antibiotics 2 or 3  times for bronchitis since last here. Needs flu vaccine. Oxygen 2-3 L/Advanced. Nebulizer helps. Not acute-this is baseline for her.  02/13/13- 23 yoF former smoker followed for COPD, tobacco cessation support, rhinitis, complicated by hx anxiety COPD w/ PFT. Pt reports breathing has been terrible. has a little occasional dry cough, chest tx but no wheezing.  Stays short of breath. Had a Z-Pak 2 weeks ago for bronchitis.still feels tight if she takes a deep breath. Rarely any mucus. No chest pain or ankle edema. O2 2-3L/ Advanced. After discussion, she is not interested in lung transplant. CXR 3/13 /14 IMPRESSION:  Old granulomatous disease and chronic hyperinflation.  No acute abnormalities.  Original Report Authenticated By: Ulyses Southward, M.D. PFT 02/13/2013-very severe obstructive airways disease with insignificant response to bronchodilator, diffusion severely reduced. Patient was unable to perform lung volume test. FVC 1.23/39%, FEV1 0.48/19%, FEV1/FVC 0.39/49%, FEF 25-75% 0.18/7%, DLCO 46%.  Review of Systems-per HPI Constitutional:   No-   weight loss, night sweats, fevers, chills, fatigue, lassitude. HEENT:   No-  headaches, difficulty swallowing, tooth/dental problems, sore throat,       No-  sneezing, itching, ear ache, +nasal congestion, post nasal drip,  CV:  No-   chest pain, orthopnea, PND, swelling in lower extremities, anasarca,  dizziness, palpitations Resp: +Persistent shortness of breath with exertion or at rest.              + productive cough,  + non-productive cough,  No- coughing up of blood.              No-   change in color of mucus.  +  wheezing.   Skin: +psoriasis GI:  No-   heartburn, indigestion, abdominal pain, nausea, vomiting,  GU:  MS:  No-   joint pain or swelling.   Neuro-     nothing unusual Psych:  No- change in mood or affect. No depression or anxiety.  No memory loss.   Objective:   Physical Exam General- Alert, Oriented, Affect-appropriate,  Distress- none acute. O2 2.5L/ 100%, slender Skin- +psoriasis on the legs and nail changes. Lymphadenopathy- none Head- atraumatic            Eyes- Gross vision intact, PERRLA, conjunctivae clear secretions            Ears- Hearing, canals-normal            Nose- turbinate edema, no-Septal dev, mucus, polyps, erosion, perforation             Throat- Mallampati II , mucosa clear , drainage- none, tonsils- atrophic Neck- flexible , trachea midline, no stridor , thyroid nl, carotid no bruit Chest - symmetrical excursion , unlabored           Heart/CV- RRR , no murmur , no gallop  , no rub, nl s1 s2                           - JVD- none , edema- none, stasis changes- none, varices- none           Lung- +very distant, wheeze- none, dry cough with deep breath , dullness-none, rub- none.            Chest wall-  Abd-  Br/ Gen/ Rectal- Not done, not indicated Extrem- cyanosis- none, clubbing, none, atrophy- none, strength- nl Neuro- grossly intact to observation

## 2013-02-19 ENCOUNTER — Telehealth: Payer: Self-pay | Admitting: Internal Medicine

## 2013-02-19 MED ORDER — PREDNISONE 10 MG PO TABS: ORAL_TABLET | ORAL | Status: DC

## 2013-02-19 NOTE — Telephone Encounter (Signed)
Per cy Ok for prednisone 10mg  4x2,3x2,2x2,1x2

## 2013-02-19 NOTE — Telephone Encounter (Signed)
I spoke with pt and she is requesting refill on pred taper form 02/13/13 OV. She is still SOB, chest tx, occasional dry cough. Please advise if okay to do so Dr. Maple Hudson thanks Pending 03/12/13 Allergies  Allergen Reactions  . Fexofenadine     REACTION: nausea  . Other     PAIN MEDICATIONS-nausea

## 2013-02-19 NOTE — Telephone Encounter (Signed)
Pt advised rx sent. Carron Curie, CMA

## 2013-02-24 ENCOUNTER — Telehealth: Payer: Self-pay | Admitting: Internal Medicine

## 2013-02-24 ENCOUNTER — Other Ambulatory Visit: Payer: Self-pay | Admitting: Internal Medicine

## 2013-02-24 MED ORDER — ALPRAZOLAM 1 MG PO TABS: 1.0000 mg | ORAL_TABLET | Freq: Three times a day (TID) | ORAL | Status: DC

## 2013-02-24 NOTE — Assessment & Plan Note (Signed)
Has remained off of cigarettes 2013. Better late than never.

## 2013-02-24 NOTE — Assessment & Plan Note (Addendum)
Flow volume loop looks more like emphysema. There is not much reactive airways disease but we will see if Virgel Bouquet helps any. Try prednisone taper for effect. Steroid talk done.

## 2013-02-24 NOTE — Telephone Encounter (Signed)
Pt called back again. Kathleen W Perdue °

## 2013-02-24 NOTE — Telephone Encounter (Signed)
Last OV 10/16/12 Pending OV 03/12/13 Last fill 08/19/12 with 5 additional refills  Allergies  Allergen Reactions  . Fexofenadine     REACTION: nausea  . Other     PAIN MEDICATIONS-nausea    CY - please advise if you are okay with this being refilled. Thanks.

## 2013-02-24 NOTE — Telephone Encounter (Signed)
Called and [facility]; pt aware that refill sent.

## 2013-02-24 NOTE — Telephone Encounter (Signed)
Ok refill xanax

## 2013-03-05 ENCOUNTER — Telehealth: Payer: Self-pay | Admitting: Internal Medicine

## 2013-03-05 MED ORDER — TIOTROPIUM BROMIDE MONOHYDRATE 18 MCG IN CAPS: 18.0000 ug | ORAL_CAPSULE | Freq: Every day | RESPIRATORY_TRACT | Status: DC

## 2013-03-05 MED ORDER — FLUTICASONE PROPIONATE 50 MCG/ACT NA SUSP: 2.0000 | Freq: Every day | NASAL | Status: DC

## 2013-03-05 MED ORDER — FLUTICASONE-SALMETEROL 500-50 MCG/DOSE IN AEPB: 1.0000 | INHALATION_SPRAY | Freq: Two times a day (BID) | RESPIRATORY_TRACT | Status: DC

## 2013-03-05 MED ORDER — DOXYCYCLINE HYCLATE 100 MG PO TABS: ORAL_TABLET | ORAL | Status: DC

## 2013-03-05 NOTE — Telephone Encounter (Signed)
Patient calling back again about the same.

## 2013-03-05 NOTE — Telephone Encounter (Signed)
Called and spoke with pt and she is aware of meds sent to the pharmacy per CY recs.  Nothing further is needed.

## 2013-03-05 NOTE — Telephone Encounter (Signed)
Pt c/o increased cough with chest congestion--pale yellow mucus, sob and chest tightness x 4days  Allergies  Allergen Reactions  . Fexofenadine     REACTION: nausea  . Other     PAIN MEDICATIONS-nausea   [facility] [address]  Ok to leave detailed message on machine.  Please Dr Maple Hudson. Thanks.

## 2013-03-05 NOTE — Telephone Encounter (Signed)
Per CY-okay to give Doxycycline 100 mg #8 take 2 today then 1 daily no refills.  

## 2013-03-12 ENCOUNTER — Ambulatory Visit (INDEPENDENT_AMBULATORY_CARE_PROVIDER_SITE_OTHER): Payer: Medicare Other | Admitting: Internal Medicine

## 2013-03-12 ENCOUNTER — Encounter: Payer: Self-pay | Admitting: Internal Medicine

## 2013-03-12 VITALS — BP 120/78 | HR 104 | Ht 61.0 in | Wt 120.4 lb

## 2013-03-12 DIAGNOSIS — J439 Emphysema, unspecified: Secondary | ICD-10-CM

## 2013-03-12 DIAGNOSIS — J9610 Chronic respiratory failure, unspecified whether with hypoxia or hypercapnia: Secondary | ICD-10-CM

## 2013-03-12 DIAGNOSIS — J438 Other emphysema: Secondary | ICD-10-CM

## 2013-03-12 DIAGNOSIS — J449 Chronic obstructive pulmonary disease, unspecified: Secondary | ICD-10-CM

## 2013-03-12 NOTE — Progress Notes (Signed)
Patient ID: Wendy [name], female    DOB: Oct 17, 1960, 52 y.o.   MRN: [id number] HPI 03/15/11- 24 yoF smoker followed for COPD, tobacco cessation support, rhinitis, complicated by hx anxiety Last here November 06, 2010- note reviewed. She has not smoked since December 2011.  Using her oxygen for the past week since she caught a cold with head and chest congestion, purulent sputum, hoarse. No fever or sore throat. Using her regular meds. Using her nebulizer regularly.  Had 7 teeth pulled in May.   08/16/11- 50 yoF smoker followed for COPD, tobacco cessation support, rhinitis, complicated by hx anxiety Has had flu vaccine. October 28 [facility] with acute bronchitis after catching a cold. Got better after treatment there but not well. We called in prednisone and a Z-Pak. She still feels some chest congestion, cough with clear mucus, feels tight. Denies fever, chest pain or blood. Has had oxygen for several months, used most of the time. Says she quit cigarettes this year by using an electronic cigarette with a mild insert. Nasal congestion especially left side. Occasional blood from left nostril. Chest x-ray: 07/15/2011-hyperinflation, no acute disease. I reviewed images.  02/25/12- 50 yoF smoker followed for COPD, tobacco cessation support, rhinitis, complicated by hx anxiety : c/o sob, congestion, and chest tightness. She is remaining off of cigarettes. Her COPD assessment test (CAT) score is 30/40 She had done well for the last several months as her last visit. Just 3 days ago and acute illness began as a gastroenteritis followed by shortness of breath, nonproductive cough and wheeze. She increased her oxygen. Using her nebulizer. Feels better today.  10/16/12- 51 yoF formersmoker followed for COPD, tobacco cessation support, rhinitis, complicated by hx anxiety follow-up: Pt c/o increased SOB with activity that has gradually been worse since last OV.  Antibiotics 2 or 3  times for bronchitis since last here. Needs flu vaccine. Oxygen 2-3 L/Advanced. Nebulizer helps. Not acute-this is baseline for her.  02/13/13- 44 yoF former smoker followed for COPD, tobacco cessation support, rhinitis, complicated by hx anxiety COPD w/ PFT. Pt reports breathing has been terrible. has a little occasional dry cough, chest tx but no wheezing.  Stays short of breath. Had a Z-Pak 2 weeks ago for bronchitis.still feels tight if she takes a deep breath. Rarely any mucus. No chest pain or ankle edema. O2 2-3L/ Advanced. After discussion, she is not interested in lung transplant. CXR 3/13 /14 IMPRESSION:  Old granulomatous disease and chronic hyperinflation.  No acute abnormalities.  Original Report Authenticated By: Ulyses Southward, M.D. PFT 02/13/2013-very severe obstructive airways disease with insignificant response to bronchodilator, diffusion severely reduced. Patient was unable to perform lung volume test. FVC 1.23/39%, FEV1 0.48/19%, FEV1/FVC 0.39/49%, FEF 25-75% 0.18/7%, DLCO 46%.  03/12/13- 24 yoF former smoker followed for COPD, tobacco cessation support, rhinitis, complicated by hx anxiety FOLLOWS FOR: pt wanted to discuss lung transplant- was told about it on 02-13-13. Wants more information. Remains off cigarettes.   Daughter here Feeling stable with no acute issues. Continuous O2 2.5L/ Advanced. We discussed progression of lung disease, outlined limitations of lung transplant and common criteria. Her disease is severe enough that she may be in her transplant window.  Preliminary discussion of end of life.   Review of Systems-per HPI Constitutional:   No-   weight loss, night sweats, fevers, chills, fatigue, lassitude. HEENT:   No-  headaches, difficulty swallowing, tooth/dental problems, sore throat,       No-  sneezing, itching, ear ache, +nasal congestion, post nasal drip,  CV:  No-   chest pain, orthopnea, PND, swelling in lower extremities, anasarca,  dizziness,  palpitations Resp: +Persistent shortness of breath with exertion or at rest.              No- productive cough,  + non-productive cough,  No- coughing up of blood.              No-   change in color of mucus.  No- wheezing.   Skin: +psoriasis GI:  No-   heartburn, indigestion, abdominal pain, nausea, vomiting,  GU:  MS:  No-   joint pain or swelling.   Neuro-     nothing unusual Psych:  No- change in mood or affect. No depression or anxiety.  No memory loss.   Objective:   Physical Exam General- Alert, Oriented, Affect-appropriate, Distress- none acute. O2 2.5L/ 100%, slender Skin- +psoriasis on the legs and nail changes. Lymphadenopathy- none Head- atraumatic            Eyes- Gross vision intact, PERRLA, conjunctivae clear secretions            Ears- Hearing, canals-normal            Nose- turbinate edema, no-Septal dev, mucus, polyps, erosion, perforation             Throat- Mallampati II , mucosa clear , drainage- none, tonsils- atrophic Neck- flexible , trachea midline, no stridor , thyroid nl, carotid no bruit Chest - symmetrical excursion , unlabored           Heart/CV- RRR , no murmur , no gallop  , no rub, nl s1 s2                           - JVD- none , edema- none, stasis changes- none, varices- none           Lung- +very distant, wheeze- none, dry cough with deep breath , dullness-none, rub- none.            Chest wall-  Abd-  Br/ Gen/ Rectal- Not done, not indicated Extrem- cyanosis- none, clubbing, none, atrophy- none, strength- nl Neuro- grossly intact to observation

## 2013-03-12 NOTE — Assessment & Plan Note (Signed)
Very severe COPD without current exacerbation. Appropriate questions about end-of life and about transplant.  We will apply for Pulmonary Rehab and make referral to transplant coordinator for initial consideration.

## 2013-03-12 NOTE — Patient Instructions (Addendum)
Order- refer for Pulmonary Rehab  Gold III COPD  Order- [facility]- refer for lung transplant consideration     Dx COPD with emphysema, chronic respiratory failure

## 2013-03-16 ENCOUNTER — Telehealth: Payer: Self-pay | Admitting: Internal Medicine

## 2013-03-16 MED ORDER — PREDNISONE 10 MG PO TABS: ORAL_TABLET | ORAL | Status: DC

## 2013-03-16 MED ORDER — ALBUTEROL SULFATE HFA 108 (90 BASE) MCG/ACT IN AERS: INHALATION_SPRAY | RESPIRATORY_TRACT | Status: DC

## 2013-03-16 NOTE — Telephone Encounter (Signed)
Called and spoke with pt and she stated that the pharmacy needed [place] to send in refill of the proair for her.  She stated that the refills given on the rx that was sent in was 12 refills and the pt stated that this was not correct.    I called  And spoke with the pharmacist and she stated that she refused to refill the proair for the pt until the pt called and spoke with CY.  Pharmacy stated that the pt told her that she is using 4 puffs every 4 hours daily.  She picked up 3 inhalers on 02/16/2013 and the pt advised the pharmacy that she is totally out of her inhalers.  Pharmacy wants to know how pt should be taking her inhalers.  CY please advise. Thanks  Allergies  Allergen Reactions  . Fexofenadine     REACTION: nausea  . Other     PAIN MEDICATIONS-nausea

## 2013-03-16 NOTE — Telephone Encounter (Signed)
Called spoke with patient, advised of CY's recs as stated below Pt verbalized her understanding and denied any questions Pt advised to call back if symptoms do not improve or worsen Rx sent to verified pharmacy Nothing further needed; will sign off.

## 2013-03-16 NOTE — Telephone Encounter (Signed)
Pt aware of below per CDY. [facility] who is also aware of below per CDY.

## 2013-03-16 NOTE — Telephone Encounter (Signed)
May have 1 more inhaler, but must last 1 month- use it only if away from the house, and only if she is wheezey, not just because she is short of breath or anxious. She should use the nebulizer machine up to 4 times/ day as needed- that's what its for.

## 2013-03-16 NOTE — Telephone Encounter (Signed)
I spoke with pt. He c/o congestion, cough w/ clear-yellow ting phlem, chest tx, bilateral ankle swelling x the weekend. She is not taking anything OTC. She is requesting prednisone. Please advise Dr. Maple Hudson thanks Last OV 03/12/13 Allergies  Allergen Reactions  . Fexofenadine     REACTION: nausea  . Other     PAIN MEDICATIONS-nausea

## 2013-03-16 NOTE — Telephone Encounter (Signed)
Per CY-lets give Prednisone 10 mg #20 take 4 x 2 days, 3 x 2 days, 2 x 2 days,1 x 2 days, then stop no refills.  

## 2013-04-20 ENCOUNTER — Telehealth: Payer: Self-pay | Admitting: Internal Medicine

## 2013-04-20 NOTE — Telephone Encounter (Signed)
I spoke with pharmacists. He had called bc pt is requesting a refill on proair. He advised me of the same thing when leigh had spoke with them on 03/16/13: "I called And spoke with the pharmacist and she stated that she refused to refill the proair for the pt until the pt called and spoke with CY. Pharmacy stated that the pt told her that she is using 4 puffs every 4 hours daily. She picked up 3 inhalers on 02/16/2013 and the pt advised the pharmacy that she is totally out of her inhalers."  RX was sent in as CDY okay for 1 more inhaler but must last for 30 days. Pt never got this filled and the pharmacy put this on hold. He was calling just to make sure it was okay to fill that RX that was phone in from last phone note. I advised him that was fine as CDY had okay'd this but she never picked it up. He is also aware to put the inhaler must last 1 month. He voiced his understanding and needed nothing further

## 2013-05-01 ENCOUNTER — Telehealth: Payer: Self-pay | Admitting: Internal Medicine

## 2013-05-01 MED ORDER — AMOXICILLIN-POT CLAVULANATE 875-125 MG PO TABS: 1.0000 | ORAL_TABLET | Freq: Two times a day (BID) | ORAL | Status: DC

## 2013-05-01 NOTE — Telephone Encounter (Signed)
Spoke with patient-- patient aware of recs listed below Aware Rx has been called in to verified pharmacy  Nothing further needed at this time

## 2013-05-01 NOTE — Telephone Encounter (Signed)
I spoke with pt. She c/o cough w/ yellow-green phlem, PND, nasal congestion, chest tx, wheezing, sinus pressure, HA, sore throat x 2 days. Denies any fever. She has not taken anything OTC d/t being scared to take anything d/t meds she is on. Requesting to have something called in. Please advise Dr. Maple Hudson thanks Last OV 03/12/13 Pending 06/18/13 Allergies  Allergen Reactions  . Fexofenadine     REACTION: nausea  . Other     PAIN MEDICATIONS-nausea

## 2013-05-01 NOTE — Telephone Encounter (Signed)
Per CY-Augmentin 875 mg #14 take po BID no refills.

## 2013-05-21 ENCOUNTER — Other Ambulatory Visit: Payer: Self-pay | Admitting: Internal Medicine

## 2013-05-23 ENCOUNTER — Telehealth: Payer: Self-pay | Admitting: Pulmonary Disease

## 2013-05-23 MED ORDER — PREDNISONE 10 MG PO TABS: ORAL_TABLET | ORAL | Status: DC

## 2013-05-23 NOTE — Telephone Encounter (Signed)
The patient called complaining of increased chest congestion, increased dyspnea (still talking in full sentences, did not seem to struggle with the breathing over the phone, able to perform her DLA), wheezing, pleuritic chest pain with cough, cough productive of yellow-green sputum, denies fever or chills.   She has h/o COPD. Currently on LABA/ICS and albuterol.   Advised to start Prednisone; prescription called to her pharmacy to pick up in am.   I asked her to call back if not improving, to call the office to schedule an appointment with Dr. Maple Hudson on Monday, and to present to the ED if not getting better.   She voiced understanding.

## 2013-05-24 NOTE — Telephone Encounter (Signed)
Noted  

## 2013-06-17 ENCOUNTER — Telehealth: Payer: Self-pay | Admitting: Internal Medicine

## 2013-06-17 MED ORDER — PREDNISONE 10 MG PO TABS: ORAL_TABLET | ORAL | Status: DC

## 2013-06-17 MED ORDER — ALBUTEROL SULFATE HFA 108 (90 BASE) MCG/ACT IN AERS: INHALATION_SPRAY | RESPIRATORY_TRACT | Status: DC

## 2013-06-17 NOTE — Telephone Encounter (Signed)
Per CY-okay to give Prednisone 10 mg #20 take 4 x 2 days, 3 x 2 days, 2 x 2 days, 1 x 2 days, then stop no refills. Augmentin 875 mg #14 take 1 po BID no refills, Refill Proair HFA and okay to get Flu and PNA vaccines.

## 2013-06-17 NOTE — Telephone Encounter (Signed)
I spoke with pt. Aware of recs. Pt did not want the ABX called in. She stated she will call once she is ready to come in for the vaccine. Nothing further needed

## 2013-06-17 NOTE — Telephone Encounter (Signed)
Pt has 2 issues: 1)Pt c/o wheezing, chest tx, cough w/ lots of yellow phlem, increase SOB x a while. She is requesting to have prednisone called in. She does want an ABX.  2)She is scheduled for appt 08/07/13. She stated she was told she needs PNA and flu vaccine. Pt last had PNA vaccine in 2010. She was told by Dr. Reche Dixon she needs PNA vaccine every 4 years and she stated she remember Dr. Maple Hudson telling her this as well. She is wanting to come in sooner for these 2 vaccines prior to her appt. She was scheduled for tomorrow but her daughter is going through a personal issue and she is having to help with daughter with. She also needed her proair refilled.  [facility] Dr. Maple Hudson thanks Allergies  Allergen Reactions  . Fexofenadine     REACTION: nausea  . Other     PAIN MEDICATIONS-nausea

## 2013-06-18 ENCOUNTER — Ambulatory Visit: Payer: Medicare Other | Admitting: Internal Medicine

## 2013-07-02 ENCOUNTER — Other Ambulatory Visit: Payer: Self-pay | Admitting: Internal Medicine

## 2013-07-07 ENCOUNTER — Telehealth: Payer: Self-pay | Admitting: Internal Medicine

## 2013-07-07 MED ORDER — PREDNISONE 10 MG PO TABS: ORAL_TABLET | ORAL | Status: DC

## 2013-07-07 MED ORDER — PREDNISONE 10 MG PO TABS: 10.0000 mg | ORAL_TABLET | Freq: Every day | ORAL | Status: DC

## 2013-07-07 MED ORDER — DOXYCYCLINE HYCLATE 100 MG PO TABS: ORAL_TABLET | ORAL | Status: DC

## 2013-07-07 NOTE — Telephone Encounter (Signed)
  Per CY-okay to give Doxycycline 100 mg #8 take 2 today then 1 daily no refills and Prednisone 10 mg #20 take 4 x 2 days, 3 x 2 days, 2 x 2 days, 1 x 2 days, then stop no refills.  Pt aware and requests that they be [facility] [address] Rx sent. Will sign off on message.    [facility]one Rx that sent for 1 qd. Pt will only get doxycyline and prednisone taper Rx's.

## 2013-07-07 NOTE — Telephone Encounter (Signed)
Called, spoke with pt.  C/o increased SOB at rest and with exertion, a little wheezing, chest tightness, and cough.  Is difficult to cough up mucus but is thick and yellow when she can.  Felt warm last night.  Symptoms started 1-2 days ago.  Pt has seen increased fluids but reports she doesn't take otc meds.  She has used albuterol hfa with not much relief.  She is requesting abx and prednisone.  Dr. Maple Hudson, pls advise.    Last OV with CDY: 03/12/13  [facility] [address]  Allergies verified with pt:  Allergies  Allergen Reactions  . Fexofenadine     REACTION: nausea  . Other     PAIN MEDICATIONS-nausea

## 2013-07-07 NOTE — Telephone Encounter (Signed)
Returning call can be reached at [phone].Wendy [name]

## 2013-07-09 ENCOUNTER — Telehealth: Payer: Self-pay | Admitting: Internal Medicine

## 2013-07-09 NOTE — Telephone Encounter (Signed)
[name] called back.  She was able to obtain pt's Medicare Part A&B information and her albuterol neb is covered >> diagnosis is needed >> 492.8. Was unable to continue conversation with [name] d/t emergency in office.  WCB tomorrow.

## 2013-07-09 NOTE — Telephone Encounter (Signed)
Patient called.  She stated that her Albuterol Neb is no longer covered under [employer] and she is unsure if her Albuterol HFA is covered.  Did advise pt that this medication is on the [facility] $4 list, but she would like to know CDY's recs.  [facility], spoke with [name] who stated that the albuterol neb is NOT covered under pt's Medicare Part D but may be covered under her A&B but they do not have this info.  Neither do we, we just have her Medicaid info.  Per [name], she will call patient to obtain this info and keep up updated.  Will forward to [name] to follow up on.

## 2013-07-10 MED ORDER — ALBUTEROL SULFATE (2.5 MG/3ML) 0.083% IN NEBU: 2.5000 mg | INHALATION_SOLUTION | Freq: Four times a day (QID) | RESPIRATORY_TRACT | Status: DC

## 2013-07-10 NOTE — Telephone Encounter (Signed)
Patient calling back about albuterol rx.

## 2013-07-10 NOTE — Telephone Encounter (Signed)
I spoke with Wendy [name] and she states she ran the rx with the diagnosis and it was approved. She states all they need right now is a hard copy of the script with the dx code on it. Wendy [name] can you print the RX and have Wendy [name] sign this and [facility]. Make sure DX code is on RX.  I cannot print RX from my PFT computer. Thanks. Carron Curie, CMA

## 2013-07-10 NOTE — Telephone Encounter (Signed)
rx has been printed and placed on CY cart to be signed.  Will fax once this is done.  Called and spoke with pt and she is aware that the pharmacy has received her medicare info and the albuterol was covered by her insurance.  This has been signed by [facility] and faxed to the pharmacy.

## 2013-07-17 ENCOUNTER — Other Ambulatory Visit: Payer: Self-pay | Admitting: Internal Medicine

## 2013-07-17 ENCOUNTER — Telehealth: Payer: Self-pay | Admitting: Internal Medicine

## 2013-07-17 MED ORDER — ALBUTEROL SULFATE HFA 108 (90 BASE) MCG/ACT IN AERS: INHALATION_SPRAY | RESPIRATORY_TRACT | Status: DC

## 2013-07-17 MED ORDER — FLUTICASONE-SALMETEROL 500-50 MCG/DOSE IN AEPB: 1.0000 | INHALATION_SPRAY | Freq: Two times a day (BID) | RESPIRATORY_TRACT | Status: DC

## 2013-07-17 NOTE — Telephone Encounter (Signed)
I spoke with pt and is aware RX has been sent. Nothing further needed 

## 2013-08-03 ENCOUNTER — Telehealth: Payer: Self-pay | Admitting: Internal Medicine

## 2013-08-03 MED ORDER — DOXYCYCLINE HYCLATE 100 MG PO TABS: ORAL_TABLET | ORAL | Status: DC

## 2013-08-03 MED ORDER — PREDNISONE 10 MG PO TABS: ORAL_TABLET | ORAL | Status: DC

## 2013-08-03 NOTE — Telephone Encounter (Signed)
Pt is aware of CY recs. Rx's have been sent in.

## 2013-08-03 NOTE — Telephone Encounter (Signed)
Per CY-give Prednisone 10 mg #20 take 4 x 2 days, 3 x 2 days, 2 x 2 days, 1 x 2 days, then stop no refills; as well as Doxycycline 100 mg #8 take 2 today then 1 daily until gone no refills.

## 2013-08-03 NOTE — Telephone Encounter (Signed)
I spoke with pt. She c/o fluid on her lungs. She reports productive cough w/ yellow phlem, wheezing, chest tx, increase SOB, slight bilateral ankle swelling, low grade fever of 100-101. She is requesting to have prednisone and a back up ABX called in for her. Please advise dr. Maple Hudson thanks Last OV 03/12/13 Pending 08/07/13 Allergies  Allergen Reactions  . Fexofenadine     REACTION: nausea  . Other     PAIN MEDICATIONS-nausea

## 2013-08-07 ENCOUNTER — Ambulatory Visit (INDEPENDENT_AMBULATORY_CARE_PROVIDER_SITE_OTHER): Payer: Medicare Other | Admitting: Internal Medicine

## 2013-08-07 ENCOUNTER — Encounter: Payer: Self-pay | Admitting: Internal Medicine

## 2013-08-07 VITALS — BP 130/76 | HR 90 | Ht 61.0 in | Wt 112.8 lb

## 2013-08-07 DIAGNOSIS — L409 Psoriasis, unspecified: Secondary | ICD-10-CM

## 2013-08-07 DIAGNOSIS — J439 Emphysema, unspecified: Secondary | ICD-10-CM

## 2013-08-07 DIAGNOSIS — Z23 Encounter for immunization: Secondary | ICD-10-CM

## 2013-08-07 DIAGNOSIS — J310 Chronic rhinitis: Secondary | ICD-10-CM

## 2013-08-07 DIAGNOSIS — L408 Other psoriasis: Secondary | ICD-10-CM

## 2013-08-07 DIAGNOSIS — J438 Other emphysema: Secondary | ICD-10-CM

## 2013-08-07 DIAGNOSIS — R21 Rash and other nonspecific skin eruption: Secondary | ICD-10-CM

## 2013-08-07 MED ORDER — AZITHROMYCIN 250 MG PO TABS: ORAL_TABLET | ORAL | Status: DC

## 2013-08-07 MED ORDER — PREDNISONE (PAK) 10 MG PO TABS: ORAL_TABLET | ORAL | Status: DC

## 2013-08-07 NOTE — Progress Notes (Signed)
Patient ID: Wendy [name], female    DOB: Jul 27, 1961, 52 y.o.   MRN: [id number] HPI 03/15/11- 34 yoF smoker followed for COPD, tobacco cessation support, rhinitis, complicated by hx anxiety Last here November 06, 2010- note reviewed. She has not smoked since December 2011.  Using her oxygen for the past week since she caught a cold with head and chest congestion, purulent sputum, hoarse. No fever or sore throat. Using her regular meds. Using her nebulizer regularly.  Had 7 teeth pulled in May.   08/16/11- 50 yoF smoker followed for COPD, tobacco cessation support, rhinitis, complicated by hx anxiety Has had flu vaccine. October 28 she [facility] with acute bronchitis after catching a cold. Got better after treatment there but not well. We called in prednisone and a Z-Pak. She still feels some chest congestion, cough with clear mucus, feels tight. Denies fever, chest pain or blood. Has had oxygen for several months, used most of the time. Says she quit cigarettes this year by using an electronic cigarette with a mild insert. Nasal congestion especially left side. Occasional blood from left nostril. Chest x-ray: 07/15/2011-hyperinflation, no acute disease. I reviewed images.  02/25/12- 50 yoF smoker followed for COPD, tobacco cessation support, rhinitis, complicated by hx anxiety : c/o sob, congestion, and chest tightness. She is remaining off of cigarettes. Her COPD assessment test (CAT) score is 30/40 She had done well for the last several months as her last visit. Just 3 days ago and acute illness began as a gastroenteritis followed by shortness of breath, nonproductive cough and wheeze. She increased her oxygen. Using her nebulizer. Feels better today.  10/16/12- 51 yoF formersmoker followed for COPD, tobacco cessation support, rhinitis, complicated by hx anxiety follow-up: Pt c/o increased SOB with activity that has gradually been worse since last OV.  Antibiotics 2 or 3  times for bronchitis since last here. Needs flu vaccine. Oxygen 2-3 L/Advanced. Nebulizer helps. Not acute-this is baseline for her.  02/13/13- 52 yoF former smoker followed for COPD, tobacco cessation support, rhinitis, complicated by hx anxiety COPD w/ PFT. Pt reports breathing has been terrible. has a little occasional dry cough, chest tx but no wheezing.  Stays short of breath. Had a Z-Pak 2 weeks ago for bronchitis.still feels tight if she takes a deep breath. Rarely any mucus. No chest pain or ankle edema. O2 2-3L/ Advanced. After discussion, she is not interested in lung transplant. CXR 3/13 /14 IMPRESSION:  Old granulomatous disease and chronic hyperinflation.  No acute abnormalities.  Original Report Authenticated By: Ulyses Southward, M.D. PFT 02/13/2013-very severe obstructive airways disease with insignificant response to bronchodilator, diffusion severely reduced. Patient was unable to perform lung volume test. FVC 1.23/39%, FEV1 0.48/19%, FEV1/FVC 0.39/49%, FEF 25-75% 0.18/7%, DLCO 46%.  03/12/13- 30 yoF former smoker followed for COPD, tobacco cessation support, rhinitis, complicated by hx anxiety FOLLOWS FOR: pt wanted to discuss lung transplant- was told about it on 02-13-13. Wants more information. Remains off cigarettes.   Daughter here Feeling stable with no acute issues. Continuous O2 2.5L/ Advanced. We discussed progression of lung disease, outlined limitations of lung transplant and common criteria. Her disease is severe enough that she may be in her transplant window.  Preliminary discussion of end of life.  08/07/13- 31 yoF former smoker followed for COPD, tobacco cessation support, rhinitis, complicated by hx anxiety FOLLOWS FOR: having hard time with breathing; put on abx and prednisone. Finishing pred and doxy. Feels need to extend course- still  green with cough and chest is tight. Incidental mention of extensive rash- onset 2 years ago. CXR 11/27/12 IMPRESSION:  Old  granulomatous disease and chronic hyperinflation.  No acute abnormalities.  Original Report Authenticated By: Ulyses Southward, M.D.  Review of Systems-per HPI Constitutional:   No-   weight loss, night sweats, fevers, chills, fatigue, lassitude. HEENT:   No-  headaches, difficulty swallowing, tooth/dental problems, sore throat,       No-  sneezing, itching, ear ache, +nasal congestion, post nasal drip,  CV:  No-   chest pain, orthopnea, PND, swelling in lower extremities, anasarca,  dizziness, palpitations Resp: +Persistent shortness of breath with exertion or at rest.              + productive cough,  + non-productive cough,  No- coughing up of blood.             +change in color of mucus.  No- wheezing.   Skin: +psoriasis rash GI:  No-   heartburn, indigestion, abdominal pain, nausea, vomiting,  GU:  MS:  No-   joint pain or swelling.   Neuro-     nothing unusual Psych:  No- change in mood or affect. No depression or anxiety.  No memory loss.   Objective:   Physical Exam General- Alert, Oriented, Affect-appropriate, Distress- none acute. O2 2.5L/ 97%, slender Skin- +psoriasis plaques on the legs, trunk,  and nail changes. Lymphadenopathy- none Head- atraumatic            Eyes- Gross vision intact, PERRLA, conjunctivae clear secretions            Ears- Hearing, canals-normal            Nose- turbinate edema, + External and Septal dev, mucus, No polyps, erosion, +crusting and                    blood L             Throat- Mallampati II , mucosa clear , drainage- none, tonsils- atrophic Neck- flexible , trachea midline, no stridor , thyroid nl, carotid no bruit Chest - symmetrical excursion , unlabored           Heart/CV- RRR , no murmur , no gallop  , no rub, nl s1 s2                           - JVD- none , edema- none, stasis changes- none, varices- none           Lung- +very distant, wheeze- none, dry cough with deep breath , dullness-none, rub- none.                             +  pursed lips           Chest wall-  Abd-  Br/ Gen/ Rectal- Not done, not indicated Extrem- cyanosis- none, clubbing, none, atrophy- none, strength- nl Neuro- grossly intact to observation

## 2013-08-07 NOTE — Patient Instructions (Addendum)
Scripts for prednisone taper and Zpak sent  Flu vax  Pneumovax-23  Order- referral to dermatology Dr Terri Piedra  Rash- psoriasis  Try otc Breathe Right nasal strips at night, and saline nose spray or your Neti pot to clear your stuffy nose

## 2013-08-17 ENCOUNTER — Other Ambulatory Visit: Payer: Self-pay | Admitting: Internal Medicine

## 2013-08-17 ENCOUNTER — Telehealth: Payer: Self-pay | Admitting: Internal Medicine

## 2013-08-17 MED ORDER — ALPRAZOLAM 1 MG PO TABS: 1.0000 mg | ORAL_TABLET | Freq: Three times a day (TID) | ORAL | Status: DC | PRN

## 2013-08-17 MED ORDER — ALBUTEROL SULFATE HFA 108 (90 BASE) MCG/ACT IN AERS: INHALATION_SPRAY | RESPIRATORY_TRACT | Status: DC

## 2013-08-17 NOTE — Telephone Encounter (Signed)
Called spoke with patient, advised [facility] okayed the refill on her Alprazolam  Verified pharmacy Pt also wanting refills on the proair Proair sent electronically Alprazolam telephoned to pharmacist Vinay at [facility] Nothing further needed; will sign off.

## 2013-08-17 NOTE — Telephone Encounter (Signed)
Ok to refill both for total 6 months

## 2013-08-17 NOTE — Telephone Encounter (Signed)
RX for alprazolam last refilled by Dr. Maple Hudson 02/24/13 #90 x 5 refills. Last OV 08/07/13. Please advise Dr. Maple Hudson thanks

## 2013-08-17 NOTE — Telephone Encounter (Signed)
Please advise if okay to refill. Thanks.  

## 2013-08-17 NOTE — Telephone Encounter (Signed)
Ok to refill 

## 2013-08-20 NOTE — Telephone Encounter (Signed)
Called refills to pharmacy voicemail.  

## 2013-08-24 ENCOUNTER — Encounter: Payer: Self-pay | Admitting: Internal Medicine

## 2013-08-24 NOTE — Assessment & Plan Note (Addendum)
Acute bronchitic exacerbation Plan- pred taper, Zpak , flu and pneumonia vaccines

## 2013-08-24 NOTE — Assessment & Plan Note (Signed)
Extensive plaque psoriasis Plan- refer to dermatology

## 2013-08-24 NOTE — Assessment & Plan Note (Signed)
Rhinitis and erosion with septal deviation Plan- saline nasal spray/ gel. Avoid nasal steroid sprays till this heals. Consider ENT ? Septoplasty.

## 2013-09-07 ENCOUNTER — Telehealth: Payer: Self-pay | Admitting: Internal Medicine

## 2013-09-07 MED ORDER — PREDNISONE 10 MG PO TABS: ORAL_TABLET | ORAL | Status: DC

## 2013-09-07 NOTE — Telephone Encounter (Signed)
Pt is needing a refill on prednisone taper. States that she is getting more chest congestion. Cough is productive with yellow mucus. Wants to knock out what ever she is getting before the holiday.  Allergies  Allergen Reactions  . Augmentin [Amoxicillin-Pot Clavulanate] Nausea Only  . Fexofenadine     REACTION: nausea  . Other     PAIN MEDICATIONS-nausea    Current Outpatient Prescriptions on File Prior to Visit  Medication Sig Dispense Refill  . albuterol (PROAIR HFA) 108 (90 BASE) MCG/ACT inhaler INHALE 2 TO 3 PUFFS EVERY 4 HOURS AS NEEDED - MUST LAST 30 DAYS PER DR.YOUNG  8.5 each  5  . albuterol (PROVENTIL) (2.5 MG/3ML) 0.083% nebulizer solution Take 3 mLs (2.5 mg total) by nebulization 4 (four) times daily. DX.  492.8  300 mL  prn  . ALPRAZolam (XANAX) 1 MG tablet TAKE 1 TABLET THREE TIMES A DAY AS NEEDED  90 tablet  5  . ALPRAZolam (XANAX) 1 MG tablet Take 1 tablet (1 mg total) by mouth 3 (three) times daily as needed for anxiety or sleep.  90 tablet  5  . azithromycin (ZITHROMAX) 250 MG tablet 2 today then one daily  6 each  0  . fluticasone (FLONASE) 50 MCG/ACT nasal spray Place 2 sprays into the nose daily.  16 g  3  . Fluticasone-Salmeterol (ADVAIR DISKUS) 500-50 MCG/DOSE AEPB Inhale 1 puff into the lungs 2 (two) times daily.  60 each  3  . ibuprofen (ADVIL) 200 MG tablet Take 3 tablets (600 mg total) by mouth every 6 (six) hours as needed for pain (side pain.  Use sparingly as NSAIDs can worsen your breathing.).  30 tablet  0  . predniSONE (DELTASONE) 10 MG tablet Take 1 tablet (10 mg total) by mouth daily.  20 tablet  0  . predniSONE (DELTASONE) 10 MG tablet Take 4 tabs by mouth x 2 days, 3 tabs x 2 days, 2 tabs x 2 days, 1 tab x 2 days and stop.  20 tablet  0  . predniSONE (STERAPRED UNI-PAK) 10 MG tablet 4 X 2 DAYS, 3 X 2 DAYS, 2 X 2 DAYS, 1 X 2 DAYS  20 tablet  0  . PROAIR HFA 108 (90 BASE) MCG/ACT inhaler INHALE 2 TO 3 PUFFS INTO THE LUNGS EVERY 4 HOURS AS NEEDED - MUST  LAST 30 DAYS PER DR.YOUNG  8.5 each  5  . SPIRIVA HANDIHALER 18 MCG inhalation capsule PLACE 1 CAPSULE INTO INHALER AND INHALE CONTENTS ONCE DAILY  30 capsule  3   No current facility-administered medications on file prior to visit.    CY - please advise. Thanks.

## 2013-09-07 NOTE — Telephone Encounter (Signed)
Per CY-okay to give Prednisone 10 mg #20 take 4 x 2 days, 3 x 2 days, 2 x 2 days, 1 x 2 days, then stop no refills. Pt is aware of rx sent to pharmacy (confirmed) on file. Pt will call the office if not feeling better once rx completed.

## 2013-09-29 ENCOUNTER — Telehealth: Payer: Self-pay | Admitting: Internal Medicine

## 2013-09-29 MED ORDER — PREDNISONE 10 MG PO TABS: ORAL_TABLET | ORAL | Status: DC

## 2013-09-29 MED ORDER — AZITHROMYCIN 250 MG PO TABS: ORAL_TABLET | ORAL | Status: DC

## 2013-09-29 NOTE — Telephone Encounter (Signed)
Spoke with pt. Aware of recs. RX has been sent. Nothing further needed 

## 2013-09-29 NOTE — Telephone Encounter (Signed)
Per CY-lets give Zpak #1 take as directed no refills and Prednisone 10 mg #20 take 4 x 2 days, 3 x 2 days, 2 x 2 days, 1 x 2 days, then stop no refills.

## 2013-09-29 NOTE — Telephone Encounter (Signed)
Called spoke with pt. She c/o increase SOB at rest and exertion, feels like fluid in lungs, non prod cough, wheezing, chest tx, ? Low grade fever but not sure, chill/sweats, body aches x 2 days. Pt is requesting ZPAK and prednisone. Please advise Dr. Maple Hudson thanks  Allergies  Allergen Reactions  . Augmentin [Amoxicillin-Pot Clavulanate] Nausea Only  . Fexofenadine     REACTION: nausea  . Other     PAIN MEDICATIONS-nausea

## 2013-10-05 ENCOUNTER — Encounter (HOSPITAL_COMMUNITY): Payer: Self-pay | Admitting: Emergency Medicine

## 2013-10-05 ENCOUNTER — Telehealth: Payer: Self-pay | Admitting: Internal Medicine

## 2013-10-05 ENCOUNTER — Emergency Department (HOSPITAL_COMMUNITY): Payer: Medicare Other

## 2013-10-05 ENCOUNTER — Inpatient Hospital Stay (HOSPITAL_COMMUNITY)
Admission: EM | Admit: 2013-10-05 | Discharge: 2013-10-13 | DRG: 193 | Disposition: A | Discharge status: Home / Self Care | Payer: Medicare Other | Attending: Internal Medicine | Admitting: Internal Medicine

## 2013-10-05 DIAGNOSIS — Z9981 Dependence on supplemental oxygen: Secondary | ICD-10-CM

## 2013-10-05 DIAGNOSIS — F411 Generalized anxiety disorder: Secondary | ICD-10-CM | POA: Diagnosis present

## 2013-10-05 DIAGNOSIS — IMO0002 Reserved for concepts with insufficient information to code with codable children: Secondary | ICD-10-CM

## 2013-10-05 DIAGNOSIS — F1010 Alcohol abuse, uncomplicated: Secondary | ICD-10-CM

## 2013-10-05 DIAGNOSIS — D7589 Other specified diseases of blood and blood-forming organs: Secondary | ICD-10-CM | POA: Diagnosis present

## 2013-10-05 DIAGNOSIS — E871 Hypo-osmolality and hyponatremia: Secondary | ICD-10-CM | POA: Diagnosis present

## 2013-10-05 DIAGNOSIS — F10239 Alcohol dependence with withdrawal, unspecified: Secondary | ICD-10-CM

## 2013-10-05 DIAGNOSIS — E8779 Other fluid overload: Secondary | ICD-10-CM | POA: Diagnosis present

## 2013-10-05 DIAGNOSIS — E860 Dehydration: Secondary | ICD-10-CM | POA: Diagnosis present

## 2013-10-05 DIAGNOSIS — E876 Hypokalemia: Secondary | ICD-10-CM | POA: Diagnosis present

## 2013-10-05 DIAGNOSIS — J449 Chronic obstructive pulmonary disease, unspecified: Secondary | ICD-10-CM | POA: Diagnosis present

## 2013-10-05 DIAGNOSIS — F10939 Alcohol use, unspecified with withdrawal, unspecified: Secondary | ICD-10-CM

## 2013-10-05 DIAGNOSIS — I10 Essential (primary) hypertension: Secondary | ICD-10-CM | POA: Diagnosis present

## 2013-10-05 DIAGNOSIS — F329 Major depressive disorder, single episode, unspecified: Secondary | ICD-10-CM | POA: Diagnosis present

## 2013-10-05 DIAGNOSIS — J9620 Acute and chronic respiratory failure, unspecified whether with hypoxia or hypercapnia: Secondary | ICD-10-CM

## 2013-10-05 DIAGNOSIS — J441 Chronic obstructive pulmonary disease with (acute) exacerbation: Secondary | ICD-10-CM | POA: Diagnosis present

## 2013-10-05 DIAGNOSIS — J9600 Acute respiratory failure, unspecified whether with hypoxia or hypercapnia: Secondary | ICD-10-CM | POA: Diagnosis present

## 2013-10-05 DIAGNOSIS — R7309 Other abnormal glucose: Secondary | ICD-10-CM | POA: Diagnosis present

## 2013-10-05 DIAGNOSIS — Z22322 Carrier or suspected carrier of Methicillin resistant Staphylococcus aureus: Secondary | ICD-10-CM

## 2013-10-05 DIAGNOSIS — R0603 Acute respiratory distress: Secondary | ICD-10-CM

## 2013-10-05 DIAGNOSIS — J189 Pneumonia, unspecified organism: Principal | ICD-10-CM | POA: Diagnosis present

## 2013-10-05 DIAGNOSIS — I519 Heart disease, unspecified: Secondary | ICD-10-CM | POA: Diagnosis present

## 2013-10-05 DIAGNOSIS — E872 Acidosis, unspecified: Secondary | ICD-10-CM | POA: Diagnosis present

## 2013-10-05 DIAGNOSIS — J4489 Other specified chronic obstructive pulmonary disease: Secondary | ICD-10-CM | POA: Diagnosis present

## 2013-10-05 DIAGNOSIS — F3289 Other specified depressive episodes: Secondary | ICD-10-CM | POA: Diagnosis present

## 2013-10-05 DIAGNOSIS — F1020 Alcohol dependence, uncomplicated: Secondary | ICD-10-CM | POA: Diagnosis present

## 2013-10-05 DIAGNOSIS — D696 Thrombocytopenia, unspecified: Secondary | ICD-10-CM | POA: Diagnosis present

## 2013-10-05 DIAGNOSIS — Z87891 Personal history of nicotine dependence: Secondary | ICD-10-CM

## 2013-10-05 LAB — POCT I-STAT 3, ART BLOOD GAS (G3+)
ACID-BASE EXCESS: 5 mmol/L — AB (ref 0.0–2.0)
Acid-Base Excess: 6 mmol/L — ABNORMAL HIGH (ref 0.0–2.0)
BICARBONATE: 35.2 meq/L — AB (ref 20.0–24.0)
Bicarbonate: 33.3 mEq/L — ABNORMAL HIGH (ref 20.0–24.0)
O2 Saturation: 99 %
O2 Saturation: 94 %
PH ART: 7.299 — AB (ref 7.350–7.450)
PO2 ART: 78 mmHg — AB (ref 80.0–100.0)
Patient temperature: 98.6
Patient temperature: 98.6
TCO2: 37 mmol/L (ref 0–100)
TCO2: 35 mmol/L (ref 0–100)
pCO2 arterial: 71.6 mmHg (ref 35.0–45.0)
pCO2 arterial: 64 mmHg (ref 35.0–45.0)
pH, Arterial: 7.324 — ABNORMAL LOW (ref 7.350–7.450)
pO2, Arterial: 175 mmHg — ABNORMAL HIGH (ref 80.0–100.0)

## 2013-10-05 LAB — CBC WITH DIFFERENTIAL/PLATELET
BASOS ABS: 0 10*3/uL (ref 0.0–0.1)
BASOS PCT: 0 % (ref 0–1)
EOS ABS: 0 10*3/uL (ref 0.0–0.7)
EOS PCT: 0 % (ref 0–5)
HCT: 41 % (ref 36.0–46.0)
Hemoglobin: 13.8 g/dL (ref 12.0–15.0)
LYMPHS ABS: 0.5 10*3/uL — AB (ref 0.7–4.0)
Lymphocytes Relative: 5 % — ABNORMAL LOW (ref 12–46)
MCH: 34.2 pg — AB (ref 26.0–34.0)
MCHC: 33.7 g/dL (ref 30.0–36.0)
MCV: 101.7 fL — ABNORMAL HIGH (ref 78.0–100.0)
Monocytes Absolute: 1.1 10*3/uL — ABNORMAL HIGH (ref 0.1–1.0)
Monocytes Relative: 10 % (ref 3–12)
NEUTROS PCT: 85 % — AB (ref 43–77)
Neutro Abs: 9.5 10*3/uL — ABNORMAL HIGH (ref 1.7–7.7)
PLATELETS: 140 10*3/uL — AB (ref 150–400)
RBC: 4.03 MIL/uL (ref 3.87–5.11)
RDW: 12.4 % (ref 11.5–15.5)
WBC: 11.1 10*3/uL — ABNORMAL HIGH (ref 4.0–10.5)

## 2013-10-05 LAB — BASIC METABOLIC PANEL
BUN: 3 mg/dL — ABNORMAL LOW (ref 6–23)
CALCIUM: 9.2 mg/dL (ref 8.4–10.5)
CO2: 32 mEq/L (ref 19–32)
Chloride: 90 mEq/L — ABNORMAL LOW (ref 96–112)
Creatinine, Ser: 0.46 mg/dL — ABNORMAL LOW (ref 0.50–1.10)
GFR calc non Af Amer: >90 mL/min (ref >90)
GLUCOSE: 174 mg/dL — AB (ref 70–99)
POTASSIUM: 4 meq/L (ref 3.7–5.3)
SODIUM: 134 meq/L — AB (ref 137–147)

## 2013-10-05 MED ORDER — PREDNISONE 10 MG PO TABS: ORAL_TABLET | ORAL | Status: DC

## 2013-10-05 MED ORDER — MAGNESIUM SULFATE 40 MG/ML IJ SOLN
2.0000 g | Freq: Once | INTRAMUSCULAR | Status: AC
Start: 2013-10-05 — End: 2013-10-05
  Administered 2013-10-05: 2 g via INTRAVENOUS
  Filled 2013-10-05: qty 50

## 2013-10-05 MED ORDER — ALBUTEROL (5 MG/ML) CONTINUOUS INHALATION SOLN
10.0000 mg/h | INHALATION_SOLUTION | Freq: Once | RESPIRATORY_TRACT | Status: AC
  Administered 2013-10-05: 10 mg/h via RESPIRATORY_TRACT
  Filled 2013-10-05: qty 20

## 2013-10-05 MED ORDER — IPRATROPIUM BROMIDE 0.02 % IN SOLN
0.5000 mg | Freq: Once | RESPIRATORY_TRACT | Status: AC
  Administered 2013-10-05: 0.5 mg via RESPIRATORY_TRACT
  Filled 2013-10-05: qty 2.5

## 2013-10-05 NOTE — Telephone Encounter (Signed)
Pt is aware of CY recs. Nothing further was needed. 

## 2013-10-05 NOTE — ED Notes (Signed)
Daughter and grandson to bedside.  RT attmepted ABG without success.  Another RT will come to try to obtain it.  Labs drawn.  Pt attempting to talk, asked her to preserve her energy for breathing.

## 2013-10-05 NOTE — Telephone Encounter (Signed)
Spoke with patient- cough-productive turned green in color yesterday; completed Zpak and Prednisone taper. Pt continues to have SOB as well. Pt feels like she needs another round of Prednisone. CY please advise. Thanks.

## 2013-10-05 NOTE — ED Provider Notes (Signed)
53 year old female with history of COPD comes in with worsening dyspnea over the last week. She was brought in by EMS who placed her on CPAP and route. She also received albuterol and methylprednisolone in route. On exam, patient is in moderate to severe respiratory distress. She is using accessory muscles of respiration. Breath sounds are markedly distant with very faint expiratory wheezes noted. Heart has regular rate and rhythm. Extremities have no cyanosis or edema. Old records are reviewed and she actually has very few hospitalizations for COPD. Treatment started with additional albuterol with ipratropium, BiPAP, magnesium.  Following the above noted treatment, she seemed to be improving slowly. Initial ABG showed respiratory acidosis but not severe. It was elected to continue to follow her since she seemed to be clinically improving. Followup ABG did show improvement in respiratory acidosis although was still present. At this point, it is felt that she would be safe to admit to step down unit and does not seem to be in imminent danger of needing intubation.  CRITICAL CARE Performed by: Dione Booze Total critical care time: 90 minutes Critical care time was exclusive of separately billable procedures and treating other patients. Critical care was necessary to treat or prevent imminent or life-threatening deterioration. Critical care was time spent personally by me on the following activities: development of treatment plan with patient and/or surrogate as well as nursing, discussions with consultants, evaluation of patient's response to treatment, examination of patient, obtaining history from patient or surrogate, ordering and performing treatments and interventions, ordering and review of laboratory studies, ordering and review of radiographic studies, pulse oximetry and re-evaluation of patient's condition.  I saw and evaluated the patient, reviewed the resident's note and I agree with the findings  and plan.  EKG Interpretation    Date/Time:  Monday October 05 2013 20:27:20 EST Ventricular Rate:  147 PR Interval:  118 QRS Duration: 88 QT Interval:  279 QTC Calculation: 436 R Axis:   -91 Text Interpretation:  Poor quality data, interpretation may be affected Sinus tachycardia Consider right atrial enlargement Markedly posterior QRS axis RSR' in V1 or V2, probably normal variant Nonspecific T abnrm, anterolateral leads Artifact in lead(s) I II III aVR aVL aVF V1 V2 V3 V4 V5 V6 When compared with ECG of 11/26/2012, HEART RATE has increased Confirmed by Preston Fleeting  MD, Lisle Skillman ([id number]) on 10/05/2013 8:42:16 PM              Dione Booze, MD 10/05/13 [id number]

## 2013-10-05 NOTE — ED Notes (Signed)
Increased shortness of breath over past week worsening over past three days. O2 sat on 4L o2 94%.  Labored breathing.  On cpap @ 5; 98% O2 sat.  Had albuteraol and atrovent en route.  Solumedrol 125IV.

## 2013-10-05 NOTE — Telephone Encounter (Signed)
Ok to repeat pred 10 mg, # 20, 4 X 2 DAYS, 3 X 2 DAYS, 2 X 2 DAYS, 1 X 2 DAYS

## 2013-10-05 NOTE — ED Notes (Signed)
Pt continues using accessory muscles to breathe but appears to be more relaxed overall.  Refuses blankets.

## 2013-10-05 NOTE — ED Notes (Signed)
Pt removed from cpap per v.o. Dr Adela Lank.  Now on O2 @4L  per [place].

## 2013-10-05 NOTE — ED Provider Notes (Signed)
CSN: [id number]     Arrival date & time 10/05/13  2018 History   First MD Initiated Contact with Patient 10/05/13 2019     Chief Complaint  Patient presents with  . Shortness of Breath   (Consider location/radiation/quality/duration/timing/severity/associated sxs/prior Treatment) Patient is a 53 y.o. female presenting with shortness of breath. The history is provided by the patient.  Shortness of Breath Severity:  Severe Onset quality:  Gradual Duration:  1 week Timing:  Constant Progression:  Worsening Chronicity:  New Context: URI and weather changes   Context: not activity, not animal exposure, not known allergens and not occupational exposure   Relieved by:  Nothing Worsened by:  Activity and coughing Ineffective treatments:  Inhaler Associated symptoms: chest pain, cough and wheezing   Associated symptoms: no abdominal pain, no fever, no headaches, no neck pain and no vomiting   Risk factors: no recent alcohol use, no hx of PE/DVT and no oral contraceptive use     Past Medical History  Diagnosis Date  . Bronchitis   . Asthma   . Seasonal allergies   . Poor dentition   . Alcoholism   . COPD (chronic obstructive pulmonary disease)   . Depression   . Oxygen dependent     home oxygen 2L/min   Past Surgical History  Procedure Laterality Date  . Cesarean section      x 2  . Tubal ligation     Family History  Problem Relation Age of Onset  . Lung cancer Father   . COPD Father    History  Substance Use Topics  . Smoking status: Former Smoker -- 1.00 packs/day for 40 years    Types: Cigarettes    Quit date: 12/26/2011  . Smokeless tobacco: Never Used  . Alcohol Use: 9.0 oz/week    15 Cans of beer per week     Comment: 5 cans of beer daily   OB History   Grav Para Term Preterm Abortions TAB SAB Ect Mult Living                 Review of Systems  Unable to perform ROS: Patient nonverbal  Constitutional: Negative for fever and chills.  HENT: Negative for  congestion and rhinorrhea.   Eyes: Negative for redness and visual disturbance.  Respiratory: Positive for cough, shortness of breath and wheezing.   Cardiovascular: Positive for chest pain. Negative for palpitations.  Gastrointestinal: Negative for nausea, vomiting and abdominal pain.  Genitourinary: Negative for dysuria and urgency.  Musculoskeletal: Negative for arthralgias, myalgias and neck pain.  Skin: Negative for pallor and wound.  Neurological: Negative for dizziness and headaches.    Allergies  Augmentin; Fexofenadine; and Other  Home Medications   Current Outpatient Rx  Name  Route  Sig  Dispense  Refill  . albuterol (PROAIR HFA) 108 (90 BASE) MCG/ACT inhaler      INHALE 2 TO 3 PUFFS EVERY 4 HOURS AS NEEDED - MUST LAST 30 DAYS PER DR.YOUNG   8.5 each   5   . albuterol (PROVENTIL) (2.5 MG/3ML) 0.083% nebulizer solution   Nebulization   Take 3 mLs (2.5 mg total) by nebulization 4 (four) times daily. DX.  492.8   300 mL   prn   . ALPRAZolam (XANAX) 1 MG tablet      TAKE 1 TABLET THREE TIMES A DAY AS NEEDED   90 tablet   5   . ALPRAZolam (XANAX) 1 MG tablet   Oral   Take 1 tablet (  1 mg total) by mouth 3 (three) times daily as needed for anxiety or sleep.   90 tablet   5   . azithromycin (ZITHROMAX) 250 MG tablet      2 today then one daily   6 each   0   . fluticasone (FLONASE) 50 MCG/ACT nasal spray   Nasal   Place 2 sprays into the nose daily.   16 g   3   . Fluticasone-Salmeterol (ADVAIR DISKUS) 500-50 MCG/DOSE AEPB   Inhalation   Inhale 1 puff into the lungs 2 (two) times daily.   60 each   3   . ibuprofen (ADVIL) 200 MG tablet   Oral   Take 3 tablets (600 mg total) by mouth every 6 (six) hours as needed for pain (side pain.  Use sparingly as NSAIDs can worsen your breathing.).   30 tablet   0   . predniSONE (DELTASONE) 10 MG tablet   Oral   Take 1 tablet (10 mg total) by mouth daily.   20 tablet   0   . predniSONE (DELTASONE) 10 MG  tablet      Take 4x2 days,3x2days,2x2days,1x2days, then stop   20 tablet   0   . PROAIR HFA 108 (90 BASE) MCG/ACT inhaler      INHALE 2 TO 3 PUFFS INTO THE LUNGS EVERY 4 HOURS AS NEEDED - MUST LAST 30 DAYS PER DR.YOUNG   8.5 each   5   . SPIRIVA HANDIHALER 18 MCG inhalation capsule      PLACE 1 CAPSULE INTO INHALER AND INHALE CONTENTS ONCE DAILY   30 capsule   3    BP 99/69  Pulse 132  Temp(Src) 98.5 F (36.9 C) (Axillary)  Resp 19  Ht 5\' 1"  (1.549 m)  Wt 120 lb (54.432 kg)  BMI 22.69 kg/m2  SpO2 98% Physical Exam  Constitutional: She is oriented to person, place, and time. She appears well-developed and well-nourished. No distress.  HENT:  Head: Normocephalic and atraumatic.  Eyes: EOM are normal. Pupils are equal, round, and reactive to light.  Neck: Normal range of motion. Neck supple.  Cardiovascular: Normal rate and regular rhythm.  Exam reveals no gallop and no friction rub.   No murmur heard. Pulmonary/Chest: She is in respiratory distress. She has wheezes. She has no rales.  Diminished breath sounds  Abdominal: Soft. She exhibits no distension. There is no tenderness.  Musculoskeletal: She exhibits no edema and no tenderness.  Neurological: She is alert and oriented to person, place, and time.  Skin: Skin is warm and dry. She is not diaphoretic.  Psychiatric: She has a normal mood and affect. Her behavior is normal.    ED Course  Procedures (including critical care time) Labs Review Labs Reviewed  CBC WITH DIFFERENTIAL - Abnormal; Notable for the following:    WBC 11.1 (*)    MCV 101.7 (*)    MCH 34.2 (*)    Platelets 140 (*)    Neutrophils Relative % 85 (*)    Neutro Abs 9.5 (*)    Lymphocytes Relative 5 (*)    Lymphs Abs 0.5 (*)    Monocytes Absolute 1.1 (*)    All other components within normal limits  BASIC METABOLIC PANEL - Abnormal; Notable for the following:    Sodium 134 (*)    Chloride 90 (*)    Glucose, Bld 174 (*)    BUN 3 (*)     Creatinine, Ser 0.46 (*)    All  other components within normal limits  POCT I-STAT 3, BLOOD GAS (G3+) - Abnormal; Notable for the following:    pH, Arterial 7.299 (*)    pCO2 arterial 71.6 (*)    pO2, Arterial 175.0 (*)    Bicarbonate 35.2 (*)    Acid-Base Excess 6.0 (*)    All other components within normal limits  POCT I-STAT 3, BLOOD GAS (G3+) - Abnormal; Notable for the following:    pH, Arterial 7.324 (*)    pCO2 arterial 64.0 (*)    pO2, Arterial 78.0 (*)    Bicarbonate 33.3 (*)    Acid-Base Excess 5.0 (*)    All other components within normal limits  BLOOD GAS, ARTERIAL  BLOOD GAS, ARTERIAL   Imaging Review Dg Chest Portable 1 View  10/05/2013   CLINICAL DATA:  Shortness of breath  EXAM: PORTABLE CHEST - 1 VIEW  COMPARISON:  Prior radiograph from 11/27/2012  FINDINGS: The cardiac and mediastinal silhouettes are stable in size and contour, and remain within normal limits.  The lungs are hyperinflated with attenuation of the pulmonary markings, consistent with emphysema. No airspace consolidation, pleural effusion, or pulmonary edema is identified. There is no pneumothorax. Calcified granuloma again noted in the right lung base. Irregular biapical pleural thickening is unchanged.  No acute osseous abnormality identified.  IMPRESSION: Emphysema.  No active cardiopulmonary process.   Electronically Signed   By: Rise Mu M.D.   On: 10/05/2013 20:52    EKG Interpretation    Date/Time:  Monday October 05 2013 20:27:20 EST Ventricular Rate:  147 PR Interval:  118 QRS Duration: 88 QT Interval:  279 QTC Calculation: 436 R Axis:   -91 Text Interpretation:  Poor quality data, interpretation may be affected Sinus tachycardia Consider right atrial enlargement Markedly posterior QRS axis RSR' in V1 or V2, probably normal variant Nonspecific T abnrm, anterolateral leads Artifact in lead(s) I II III aVR aVL aVF V1 V2 V3 V4 V5 V6 When compared with ECG of 11/26/2012, HEART RATE has  increased Confirmed by Preston Fleeting  MD, DAVID ([id number]) on 10/05/2013 8:42:16 PM            MDM   1. COPD exacerbation   2. Respiratory distress     Patient is a 53 year old female with a chief complaint of shortness of breath. Patient says this been going on for the past week. Patient felt like he got to the point today that she continue any more. She called EMS was tripoding on the scene no lung sounds were heard patient was placed on continuous nebulizer treatment given 125 of Solu-Medrol then taken here. Patient with some improvement in route having Tory wheezes but diminished breath sounds tripoding with retractions. Visuals given magnesium placed back on the BiPAP with a continuous nebulizer for an hour. Patient with improvement while in the EEG having increased breath sounds wheezes and story and expiratory increased lung sounds.  Patient doesn't have any elevated white count also with marked hypercarbia at 71.6.  Patient reassessed will recheck ABG and will take off BiPAP for break.  Will admit to medicine, stepdown.      Melene Plan, MD 10/06/13 [phone]

## 2013-10-05 NOTE — ED Notes (Signed)
RT drawing second ABG.  Warm blankets to pt.

## 2013-10-06 ENCOUNTER — Encounter (HOSPITAL_COMMUNITY): Payer: Self-pay | Admitting: *Deleted

## 2013-10-06 DIAGNOSIS — J441 Chronic obstructive pulmonary disease with (acute) exacerbation: Secondary | ICD-10-CM

## 2013-10-06 DIAGNOSIS — E871 Hypo-osmolality and hyponatremia: Secondary | ICD-10-CM | POA: Diagnosis present

## 2013-10-06 DIAGNOSIS — D696 Thrombocytopenia, unspecified: Secondary | ICD-10-CM | POA: Diagnosis present

## 2013-10-06 DIAGNOSIS — J9600 Acute respiratory failure, unspecified whether with hypoxia or hypercapnia: Secondary | ICD-10-CM | POA: Diagnosis present

## 2013-10-06 DIAGNOSIS — J449 Chronic obstructive pulmonary disease, unspecified: Secondary | ICD-10-CM | POA: Diagnosis present

## 2013-10-06 DIAGNOSIS — E860 Dehydration: Secondary | ICD-10-CM | POA: Diagnosis present

## 2013-10-06 DIAGNOSIS — R0609 Other forms of dyspnea: Secondary | ICD-10-CM

## 2013-10-06 DIAGNOSIS — R0989 Other specified symptoms and signs involving the circulatory and respiratory systems: Secondary | ICD-10-CM

## 2013-10-06 LAB — MRSA PCR SCREENING: MRSA by PCR: POSITIVE — AB

## 2013-10-06 LAB — CBC
HEMATOCRIT: 37.1 % (ref 36.0–46.0)
Hemoglobin: 12.4 g/dL (ref 12.0–15.0)
MCH: 33.4 pg (ref 26.0–34.0)
MCHC: 33.4 g/dL (ref 30.0–36.0)
MCV: 100 fL (ref 78.0–100.0)
Platelets: 135 10*3/uL — ABNORMAL LOW (ref 150–400)
RBC: 3.71 MIL/uL — AB (ref 3.87–5.11)
RDW: 12.3 % (ref 11.5–15.5)
WBC: 12.3 10*3/uL — ABNORMAL HIGH (ref 4.0–10.5)

## 2013-10-06 LAB — EXPECTORATED SPUTUM ASSESSMENT W REFEX TO RESP CULTURE

## 2013-10-06 LAB — BASIC METABOLIC PANEL
BUN: 5 mg/dL — AB (ref 6–23)
CO2: 28 meq/L (ref 19–32)
Calcium: 8.9 mg/dL (ref 8.4–10.5)
Chloride: 86 mEq/L — ABNORMAL LOW (ref 96–112)
Creatinine, Ser: 0.49 mg/dL — ABNORMAL LOW (ref 0.50–1.10)
GFR calc Af Amer: >90 mL/min (ref >90)
GFR calc non Af Amer: >90 mL/min (ref >90)
GLUCOSE: 154 mg/dL — AB (ref 70–99)
POTASSIUM: 3.6 meq/L — AB (ref 3.7–5.3)
Sodium: 129 mEq/L — ABNORMAL LOW (ref 137–147)

## 2013-10-06 LAB — SODIUM, URINE, RANDOM: Sodium, Ur: <20 mEq/L

## 2013-10-06 LAB — EXPECTORATED SPUTUM ASSESSMENT W GRAM STAIN, RFLX TO RESP C

## 2013-10-06 MED ORDER — ALPRAZOLAM 0.5 MG PO TABS
1.0000 mg | ORAL_TABLET | Freq: Three times a day (TID) | ORAL | Status: DC | PRN
  Administered 2013-10-06 – 2013-10-07 (×6): 1 mg via ORAL
  Filled 2013-10-06 (×6): qty 2

## 2013-10-06 MED ORDER — LEVOFLOXACIN IN D5W 750 MG/150ML IV SOLN
750.0000 mg | INTRAVENOUS | Status: DC
  Administered 2013-10-06 – 2013-10-07 (×2): 750 mg via INTRAVENOUS
  Filled 2013-10-06 (×2): qty 150

## 2013-10-06 MED ORDER — TIOTROPIUM BROMIDE MONOHYDRATE 18 MCG IN CAPS
18.0000 ug | ORAL_CAPSULE | Freq: Every day | RESPIRATORY_TRACT | Status: DC
  Administered 2013-10-06 – 2013-10-07 (×2): 18 ug via RESPIRATORY_TRACT
  Filled 2013-10-06: qty 5

## 2013-10-06 MED ORDER — MOMETASONE FURO-FORMOTEROL FUM 200-5 MCG/ACT IN AERO
2.0000 | INHALATION_SPRAY | Freq: Two times a day (BID) | RESPIRATORY_TRACT | Status: DC
  Administered 2013-10-06 – 2013-10-07 (×3): 2 via RESPIRATORY_TRACT
  Filled 2013-10-06: qty 8.8

## 2013-10-06 MED ORDER — POTASSIUM CHLORIDE CRYS ER 20 MEQ PO TBCR
40.0000 meq | EXTENDED_RELEASE_TABLET | Freq: Once | ORAL | Status: AC
  Administered 2013-10-06: 40 meq via ORAL
  Filled 2013-10-06: qty 2

## 2013-10-06 MED ORDER — BIOTENE DRY MOUTH MT LIQD
15.0000 mL | Freq: Two times a day (BID) | OROMUCOSAL | Status: DC
  Administered 2013-10-06 – 2013-10-11 (×9): 15 mL via OROMUCOSAL

## 2013-10-06 MED ORDER — CHLORHEXIDINE GLUCONATE CLOTH 2 % EX PADS
6.0000 | MEDICATED_PAD | Freq: Every day | CUTANEOUS | Status: AC
  Administered 2013-10-06 – 2013-10-10 (×5): 6 via TOPICAL

## 2013-10-06 MED ORDER — IBUPROFEN 600 MG PO TABS
600.0000 mg | ORAL_TABLET | Freq: Four times a day (QID) | ORAL | Status: DC | PRN
  Filled 2013-10-06: qty 1

## 2013-10-06 MED ORDER — METHYLPREDNISOLONE SODIUM SUCC 125 MG IJ SOLR
60.0000 mg | Freq: Four times a day (QID) | INTRAMUSCULAR | Status: DC
  Administered 2013-10-06 – 2013-10-07 (×7): 60 mg via INTRAVENOUS
  Filled 2013-10-06: qty 0.96
  Filled 2013-10-06 (×2): qty 2
  Filled 2013-10-06 (×9): qty 0.96

## 2013-10-06 MED ORDER — METHYLPREDNISOLONE SODIUM SUCC 125 MG IJ SOLR: 60.0000 mg | Freq: Four times a day (QID) | INTRAMUSCULAR | Status: DC

## 2013-10-06 MED ORDER — MUPIROCIN 2 % EX OINT
1.0000 "application " | TOPICAL_OINTMENT | Freq: Two times a day (BID) | CUTANEOUS | Status: AC
  Administered 2013-10-06 – 2013-10-10 (×10): 1 via NASAL
  Filled 2013-10-06 (×2): qty 22

## 2013-10-06 MED ORDER — FLUTICASONE PROPIONATE 50 MCG/ACT NA SUSP
2.0000 | Freq: Every day | NASAL | Status: DC
  Administered 2013-10-06 – 2013-10-13 (×6): 2 via NASAL
  Filled 2013-10-06: qty 16

## 2013-10-06 MED ORDER — HEPARIN SODIUM (PORCINE) 5000 UNIT/ML IJ SOLN
5000.0000 [IU] | Freq: Three times a day (TID) | INTRAMUSCULAR | Status: DC
  Administered 2013-10-06 – 2013-10-13 (×23): 5000 [IU] via SUBCUTANEOUS
  Filled 2013-10-06 (×25): qty 1

## 2013-10-06 MED ORDER — SODIUM CHLORIDE 0.9 % IJ SOLN
3.0000 mL | Freq: Two times a day (BID) | INTRAMUSCULAR | Status: DC
  Administered 2013-10-06 – 2013-10-13 (×9): 3 mL via INTRAVENOUS

## 2013-10-06 MED ORDER — SODIUM CHLORIDE 0.9 % IV SOLN
INTRAVENOUS | Status: DC
  Administered 2013-10-06: 22:00:00 via INTRAVENOUS
  Administered 2013-10-06: 125 mL/h via INTRAVENOUS

## 2013-10-06 NOTE — Progress Notes (Signed)
Utilization review completed.  

## 2013-10-06 NOTE — Progress Notes (Signed)
[facility] Progress Note [place] TEAM 1 - Stepdown/ICU TEAM   CHELBI HERBER ZHY:[id number] DOB: May 22, 1961 DOA: 10/05/2013 PCP: Mia Creek, MD  Brief narrative: This is a 53 y/o female with severe COPD who presents with cough with brown sputum progressive over the past few days not relieved by Zpak. EMS was called for respiratory distress and was placed on a BiPAP in the ER  Subjective: She states she is feeling better but admits that she is not at baseline. She admits to being anxious.   Assessment/Plan: Principal Problem:   Acute respiratory failure/ COPD exacerbation - cont current dose of steroids and antibiotics - still does not appear stable enough to transfer off of the SDU   Active Problems:  Anxiety - continue current dose of Xanax- may need extra sedative at bedtime     Hyponatremia/ Dehydration - U sodium < 20 - will start IVF today  Hypokalemia - replaced    Thrombocytopenia, unspecified - follow - may be viral infection?  Code Status: Full code Family Communication: none Disposition Plan: follow in SDU- hold off on ambulating today  Consultants: none  Procedures: none  Antibiotics: Levaquin 1/19  DVT prophylaxis: Heparin  Objective: Blood pressure 108/79, pulse 105, temperature 98.6 F (37 C), temperature source Oral, resp. rate 15, height 5\' 1"  (1.549 m), weight 48.7 kg (107 lb 5.8 oz), SpO2 98.00%.  Intake/Output Summary (Last 24 hours) at 10/06/13 1441 Last data filed at 10/06/13 0900  Gross per 24 hour  Intake    120 ml  Output    550 ml  Net   -430 ml     Exam: General: AAO x 3 - appears anxious Lungs: poor breath sounds, mild wheeze in left lung field - mild resp distress but able to complete sentences Cardiovascular: Regular rate and rhythm without murmur gallop or rub normal S1 and S2 Abdomen: Nontender, nondistended, soft, bowel sounds positive, no rebound, no ascites, no appreciable mass Extremities: No  significant cyanosis, clubbing, or edema bilateral lower extremities  Data Reviewed: Basic Metabolic Panel:  Recent Labs Lab 10/05/13 2022 10/06/13 0330  NA 134* 129*  K 4.0 3.6*  CL 90* 86*  CO2 32 28  GLUCOSE 174* 154*  BUN 3* 5*  CREATININE 0.46* 0.49*  CALCIUM 9.2 8.9   Liver Function Tests: No results found for this basename: AST, ALT, ALKPHOS, BILITOT, PROT, ALBUMIN,  in the last 168 hours No results found for this basename: LIPASE, AMYLASE,  in the last 168 hours No results found for this basename: AMMONIA,  in the last 168 hours CBC:  Recent Labs Lab 10/05/13 2022 10/06/13 0330  WBC 11.1* 12.3*  NEUTROABS 9.5*  --   HGB 13.8 12.4  HCT 41.0 37.1  MCV 101.7* 100.0  PLT 140* 135*   Cardiac Enzymes: No results found for this basename: CKTOTAL, CKMB, CKMBINDEX, TROPONINI,  in the last 168 hours BNP (last 3 results) No results found for this basename: PROBNP,  in the last 8760 hours CBG: No results found for this basename: GLUCAP,  in the last 168 hours  Recent Results (from the past 240 hour(s))  MRSA PCR SCREENING     Status: Abnormal   Collection Time    10/06/13  2:24 AM      Result Value Range Status   MRSA by PCR POSITIVE (*) NEGATIVE Final   Comment:            The GeneXpert MRSA Assay (FDA  approved for NASAL specimens     only), is one component of a     comprehensive MRSA colonization     surveillance program. It is not     intended to diagnose MRSA     infection nor to guide or     monitor treatment for     MRSA infections.     RESULT CALLED TO, READ BACK BY AND VERIFIED WITH:     IRBY,T RN 0518 10/06/13 MITCHELL,L     Studies:  Recent x-ray studies have been reviewed in detail by the Attending Physician  Scheduled Meds:  Scheduled Meds: . antiseptic oral rinse  15 mL Mouth Rinse BID  . Chlorhexidine Gluconate Cloth  6 each Topical Q0600  . fluticasone  2 spray Each Nare Daily  . heparin  5,000 Units Subcutaneous Q8H  .  levofloxacin (LEVAQUIN) IV  750 mg Intravenous Q24H  . methylPREDNISolone (SOLU-MEDROL) injection  60 mg Intravenous Q6H  . mometasone-formoterol  2 puff Inhalation BID  . mupirocin ointment  1 application Nasal BID  . sodium chloride  3 mL Intravenous Q12H  . tiotropium  18 mcg Inhalation Daily   Continuous Infusions: . sodium chloride 125 mL/hr (10/06/13 1235)    Time spent on care of this patient: 35 min   Ebelyn Bohnet, MD  [facility] Office  [phone] Pager - Text Page per Loretha Stapler as per below:  On-Call/Text Page:      [email or web address]      password [email or web address]  If 7PM-7AM, please contact night-coverage [email or web address] Password [email or web address] 10/06/2013, 2:41 PM   LOS: 1 day

## 2013-10-06 NOTE — ED Notes (Signed)
Pt states she feels she is breathing much better.  Is on O2 at home via [place] at 4L.  Same here.  States she just finished a round of ATB and prednisone; restarted prednisone today after calling her PMD and told the office she was trying to keep from coming to the ED.

## 2013-10-06 NOTE — ED Notes (Signed)
Dr Julian Reil in room to admit pt.

## 2013-10-06 NOTE — ED Notes (Signed)
Report to [facility] on 3S.  Pt to floor on monitor with RN accompanying.

## 2013-10-06 NOTE — Plan of Care (Signed)
K and Na low- K dur x 1 ordered and random urine Na also ordered  Junious Silk, ANP

## 2013-10-06 NOTE — H&P (Signed)
[facility] History and Physical  Wendy [name] MHD:[id number] DOB: 28-Jul-1961 DOA: 10/05/2013  Referring physician: EDP PCP: Mia Creek, MD   Chief Complaint: Respiratory distress   HPI: Wendy [name] is a 53 y.o. female h/o very severe COPD / emphyzema.  Patient presents with several day history of worsening dyspnea, cough productive of brownish sputum, SOB.  Not relieved by a Zpak that she just finished at home.  This evening EMS was called and arrived to find the patient with inaudible breath sounds, in severe respiratory distress / failure.  After CPAP, 125 solumedrol given by EMS, BIPAP, and continuous neb.  She has narrowly avoided intubation and now has some breath sounds.  Review of Systems: Systems reviewed.  As above, otherwise negative  Past Medical History  Diagnosis Date  . Bronchitis   . Asthma   . Seasonal allergies   . Poor dentition   . Alcoholism   . COPD (chronic obstructive pulmonary disease)   . Depression   . Oxygen dependent     home oxygen 2L/min   Past Surgical History  Procedure Laterality Date  . Cesarean section      x 2  . Tubal ligation     Social History:  reports that she quit smoking about 21 months ago. Her smoking use included Cigarettes. She has a 40 pack-year smoking history. She has never used smokeless tobacco. She reports that she drinks about 9.0 ounces of alcohol per week. She reports that she does not use illicit drugs.  Allergies  Allergen Reactions  . Augmentin [Amoxicillin-Pot Clavulanate] Nausea Only  . Fexofenadine     REACTION: nausea  . Other     PAIN MEDICATIONS-nausea    Family History  Problem Relation Age of Onset  . Lung cancer Father   . COPD Father      Prior to Admission medications   Medication Sig Start Date End Date Taking? Authorizing Provider  albuterol (PROAIR HFA) 108 (90 BASE) MCG/ACT inhaler INHALE 2 TO 3 PUFFS EVERY 4 HOURS AS NEEDED - MUST LAST 30 DAYS PER DR.YOUNG 08/17/13    Waymon Budge, MD  albuterol (PROVENTIL) (2.5 MG/3ML) 0.083% nebulizer solution Take 3 mLs (2.5 mg total) by nebulization 4 (four) times daily. DX.  492.8 07/10/13 07/10/14  Waymon Budge, MD  ALPRAZolam Prudy Feeler) 1 MG tablet TAKE 1 TABLET THREE TIMES A DAY AS NEEDED 08/17/13   Waymon Budge, MD  ALPRAZolam Prudy Feeler) 1 MG tablet Take 1 tablet (1 mg total) by mouth 3 (three) times daily as needed for anxiety or sleep. 08/17/13   Waymon Budge, MD  azithromycin (ZITHROMAX) 250 MG tablet 2 today then one daily 09/29/13   Waymon Budge, MD  fluticasone [facility]) 50 MCG/ACT nasal spray Place 2 sprays into the nose daily. 03/05/13 03/06/14  Waymon Budge, MD  Fluticasone-Salmeterol (ADVAIR DISKUS) 500-50 MCG/DOSE AEPB Inhale 1 puff into the lungs 2 (two) times daily. 07/17/13   Waymon Budge, MD  ibuprofen (ADVIL) 200 MG tablet Take 3 tablets (600 mg total) by mouth every 6 (six) hours as needed for pain (side pain.  Use sparingly as NSAIDs can worsen your breathing.). 12/01/12   Tora Kindred York, PA-C  predniSONE (DELTASONE) 10 MG tablet Take 1 tablet (10 mg total) by mouth daily. 07/07/13   Waymon Budge, MD  predniSONE (DELTASONE) 10 MG tablet Take 4x2 days,3x2days,2x2days,1x2days, then stop 10/05/13   Waymon Budge, MD  PROAIR HFA 108 [phone] BASE) MCG/ACT  inhaler INHALE 2 TO 3 PUFFS INTO THE LUNGS EVERY 4 HOURS AS NEEDED - MUST LAST 30 DAYS PER DR.YOUNG 08/17/13   Waymon Budge, MD  SPIRIVA HANDIHALER 18 MCG inhalation capsule PLACE 1 CAPSULE INTO INHALER AND INHALE CONTENTS ONCE DAILY 07/02/13   Waymon Budge, MD   Physical Exam: Filed Vitals:   10/06/13 0045  BP: 108/61  Pulse: 102  Temp:   Resp: 12    BP 108/61  Pulse 102  Temp(Src) 98.5 F (36.9 C) (Axillary)  Resp 12  Ht 5\' 1"  (1.549 m)  Wt 54.432 kg (120 lb)  BMI 22.69 kg/m2  SpO2 97%  General Appearance:    Alert, oriented, no distress, appears stated age  Head:    Normocephalic, atraumatic  Eyes:    PERRL, EOMI, sclera  non-icteric        Nose:   Nares without drainage or epistaxis. Mucosa, turbinates normal  Throat:   Moist mucous membranes. Oropharynx without erythema or exudate.  Neck:   Supple. No carotid bruits.  No thyromegaly.  No lymphadenopathy.   Back:     No CVA tenderness, no spinal tenderness  Lungs:     Severely diminished breath sounds in all lung fields  Chest wall:    No tenderness to palpitation  Heart:    Regular rate and rhythm without murmurs, gallops, rubs  Abdomen:     Soft, non-tender, nondistended, normal bowel sounds, no organomegaly  Genitalia:    deferred  Rectal:    deferred  Extremities:   No clubbing, cyanosis or edema.  Pulses:   2+ and symmetric all extremities  Skin:   Skin color, texture, turgor normal, no rashes or lesions  Lymph nodes:   Cervical, supraclavicular, and axillary nodes normal  Neurologic:   CNII-XII intact. Normal strength, sensation and reflexes      throughout    Labs on Admission:  Basic Metabolic Panel:  Recent Labs Lab 10/05/13 2022  NA 134*  K 4.0  CL 90*  CO2 32  GLUCOSE 174*  BUN 3*  CREATININE 0.46*  CALCIUM 9.2   Liver Function Tests: No results found for this basename: AST, ALT, ALKPHOS, BILITOT, PROT, ALBUMIN,  in the last 168 hours No results found for this basename: LIPASE, AMYLASE,  in the last 168 hours No results found for this basename: AMMONIA,  in the last 168 hours CBC:  Recent Labs Lab 10/05/13 2022  WBC 11.1*  NEUTROABS 9.5*  HGB 13.8  HCT 41.0  MCV 101.7*  PLT 140*   Cardiac Enzymes: No results found for this basename: CKTOTAL, CKMB, CKMBINDEX, TROPONINI,  in the last 168 hours  BNP (last 3 results) No results found for this basename: PROBNP,  in the last 8760 hours CBG: No results found for this basename: GLUCAP,  in the last 168 hours  Radiological Exams on Admission: Dg Chest Portable 1 View  10/05/2013   CLINICAL DATA:  Shortness of breath  EXAM: PORTABLE CHEST - 1 VIEW  COMPARISON:  Prior  radiograph from 11/27/2012  FINDINGS: The cardiac and mediastinal silhouettes are stable in size and contour, and remain within normal limits.  The lungs are hyperinflated with attenuation of the pulmonary markings, consistent with emphysema. No airspace consolidation, pleural effusion, or pulmonary edema is identified. There is no pneumothorax. Calcified granuloma again noted in the right lung base. Irregular biapical pleural thickening is unchanged.  No acute osseous abnormality identified.  IMPRESSION: Emphysema.  No active cardiopulmonary process.   Electronically  Signed   By: Rise MuBenjamin  McClintock M.D.   On: 10/05/2013 20:52    EKG: Independently reviewed.  Assessment/Plan Principal Problem:   Acute respiratory failure Active Problems:   COPD with emphysema   COPD exacerbation   1. COPD exacerbation - patient with very severe COPD at baseline, acute exacerbation, has narrowly avoided intubation in ED (for now).  High dose IV steroids ordered, solumedrol 60mg  q6h, adult wheeze protocol, levaquin.  Admitting patient to SDU.    Code Status: Full Code  Family Communication: Family at bedside Disposition Plan: Admit to SDU   Time spent: 70 min  GARDNER, JARED M. [facility] Pager [phone]  If 7AM-7PM, please contact the day team taking care of the patient [email or web address] Password [email or web address] 10/06/2013, 1:02 AM

## 2013-10-07 ENCOUNTER — Inpatient Hospital Stay (HOSPITAL_COMMUNITY): Payer: Medicare Other

## 2013-10-07 LAB — BLOOD GAS, ARTERIAL
ACID-BASE EXCESS: 7.3 mmol/L — AB (ref 0.0–2.0)
BICARBONATE: 34.1 meq/L — AB (ref 20.0–24.0)
Drawn by: 347641
FIO2: 1 %
O2 SAT: 99.8 %
PATIENT TEMPERATURE: 98.1
PH ART: 7.271 — AB (ref 7.350–7.450)
TCO2: 36.5 mmol/L (ref 0–100)
pCO2 arterial: 76.3 mmHg (ref 35.0–45.0)
pO2, Arterial: 389 mmHg — ABNORMAL HIGH (ref 80.0–100.0)

## 2013-10-07 LAB — BASIC METABOLIC PANEL
BUN: 5 mg/dL — ABNORMAL LOW (ref 6–23)
CHLORIDE: 95 meq/L — AB (ref 96–112)
CO2: 27 mEq/L (ref 19–32)
Calcium: 8.8 mg/dL (ref 8.4–10.5)
Creatinine, Ser: 0.35 mg/dL — ABNORMAL LOW (ref 0.50–1.10)
GFR calc non Af Amer: >90 mL/min (ref >90)
GLUCOSE: 107 mg/dL — AB (ref 70–99)
POTASSIUM: 4.8 meq/L (ref 3.7–5.3)
SODIUM: 136 meq/L — AB (ref 137–147)

## 2013-10-07 LAB — CBC
HCT: 37.5 % (ref 36.0–46.0)
Hemoglobin: 12.5 g/dL (ref 12.0–15.0)
MCH: 33.8 pg (ref 26.0–34.0)
MCHC: 33.3 g/dL (ref 30.0–36.0)
MCV: 101.4 fL — ABNORMAL HIGH (ref 78.0–100.0)
PLATELETS: 141 10*3/uL — AB (ref 150–400)
RBC: 3.7 MIL/uL — ABNORMAL LOW (ref 3.87–5.11)
RDW: 12.5 % (ref 11.5–15.5)
WBC: 11.2 10*3/uL — ABNORMAL HIGH (ref 4.0–10.5)

## 2013-10-07 MED ORDER — METHYLPREDNISOLONE SODIUM SUCC 125 MG IJ SOLR
60.0000 mg | Freq: Three times a day (TID) | INTRAMUSCULAR | Status: DC
  Administered 2013-10-07 – 2013-10-09 (×5): 60 mg via INTRAVENOUS
  Filled 2013-10-07 (×8): qty 0.96

## 2013-10-07 MED ORDER — HYDRALAZINE HCL 20 MG/ML IJ SOLN
INTRAMUSCULAR | Status: AC
  Filled 2013-10-07: qty 1

## 2013-10-07 MED ORDER — MORPHINE SULFATE 2 MG/ML IJ SOLN
1.0000 mg | INTRAMUSCULAR | Status: DC | PRN
  Administered 2013-10-08: 1 mg via INTRAVENOUS
  Filled 2013-10-07: qty 1

## 2013-10-07 MED ORDER — LEVOFLOXACIN 750 MG PO TABS
750.0000 mg | ORAL_TABLET | Freq: Every day | ORAL | Status: DC
  Filled 2013-10-07: qty 1

## 2013-10-07 MED ORDER — BUDESONIDE 0.25 MG/2ML IN SUSP
0.2500 mg | Freq: Two times a day (BID) | RESPIRATORY_TRACT | Status: DC
  Administered 2013-10-07 – 2013-10-13 (×12): 0.25 mg via RESPIRATORY_TRACT
  Filled 2013-10-07 (×15): qty 2

## 2013-10-07 MED ORDER — LORAZEPAM 2 MG/ML IJ SOLN
0.5000 mg | INTRAMUSCULAR | Status: DC | PRN
  Administered 2013-10-08 – 2013-10-09 (×6): 0.5 mg via INTRAVENOUS
  Filled 2013-10-07 (×6): qty 1

## 2013-10-07 MED ORDER — BENZONATATE 100 MG PO CAPS
200.0000 mg | ORAL_CAPSULE | Freq: Three times a day (TID) | ORAL | Status: DC | PRN
  Administered 2013-10-07 – 2013-10-13 (×4): 200 mg via ORAL
  Filled 2013-10-07 (×4): qty 2

## 2013-10-07 MED ORDER — ALBUTEROL SULFATE (2.5 MG/3ML) 0.083% IN NEBU
2.5000 mg | INHALATION_SOLUTION | RESPIRATORY_TRACT | Status: DC | PRN
Start: 2013-10-07 — End: 2013-10-11

## 2013-10-07 MED ORDER — HYDRALAZINE HCL 20 MG/ML IJ SOLN
5.0000 mg | INTRAMUSCULAR | Status: DC | PRN
  Administered 2013-10-08 – 2013-10-10 (×5): 5 mg via INTRAVENOUS
  Filled 2013-10-07 (×5): qty 1

## 2013-10-07 MED ORDER — HYDROCORTISONE 1 % EX CREA
TOPICAL_CREAM | Freq: Four times a day (QID) | CUTANEOUS | Status: DC | PRN
  Administered 2013-10-07: 11:00:00 via TOPICAL
  Filled 2013-10-07 (×3): qty 28

## 2013-10-07 MED ORDER — GUAIFENESIN ER 600 MG PO TB12
600.0000 mg | ORAL_TABLET | Freq: Two times a day (BID) | ORAL | Status: DC
  Administered 2013-10-07 – 2013-10-13 (×12): 600 mg via ORAL
  Filled 2013-10-07 (×14): qty 1

## 2013-10-07 MED ORDER — LEVOFLOXACIN 750 MG PO TABS
750.0000 mg | ORAL_TABLET | Freq: Every day | ORAL | Status: DC
  Filled 2013-10-07 (×3): qty 1

## 2013-10-07 MED ORDER — IPRATROPIUM-ALBUTEROL 0.5-2.5 (3) MG/3ML IN SOLN
3.0000 mL | Freq: Four times a day (QID) | RESPIRATORY_TRACT | Status: DC
  Administered 2013-10-07 – 2013-10-09 (×8): 3 mL via RESPIRATORY_TRACT
  Filled 2013-10-07 (×8): qty 3

## 2013-10-07 MED ORDER — HYDRALAZINE HCL 20 MG/ML IJ SOLN
10.0000 mg | Freq: Once | INTRAMUSCULAR | Status: AC
  Administered 2013-10-07: 10 mg via INTRAVENOUS

## 2013-10-07 NOTE — Progress Notes (Addendum)
Shift event: Earlier, RN paged this NP secondary to pt having increased WOB, tachypnea, and desaturatting on [place] into the upper 70s. Pt had been given her ordered Xanax and placed on NRB. NP to bedside. S: Very SOB, anxious, better than 20 mins ago.  O: chronically ill appearing WF in mild resp distress. Alert. Oriented. O2 sats normal now on nasal cannula and RR has slowed although she is still taking very deep breaths. Appears anxious.  A/P: 1. respiratory distress with acute on chronic resp failure, hx COPD/exacerbation-better since xanax, but will need to watch closely. Going to get ABG for comparison. Ordered MSO4 for resp distress. Continue other treatments for exacerbation per attending orders.  Update: ABG back, results reviewed. Pt went into acute resp distress again and was placed on Bipap. Will use Ativan IV while on Bipap. NPO while on bipap. Hold po meds for now. Will recheck ABG after 3 hours of Bipap. Monitor closely, could very well need intubation. R/p CXR-negative except for COPD. Last CT chest on chart, 2012. Check Ddimer. May need to r/o PE.  Update: ABG r/p showed increasing pH, decreasing PCO2. CTA neg for PE, but positive for RUL PNA and pulmonary nodules that will require outpt f/up with CT chest in about 6 mos. Given pt's hx MRSA, COPD, alcoholism, ICU stay, and over 48 hrs in hospital, will treat for HAP and cover with triple abx therapy-Zosyn, Levaquin, and Vanc to be started and monitored by pharmacy. Pt cont's on Bipap.  Jimmye Norman, NP [facility]

## 2013-10-07 NOTE — Progress Notes (Signed)
RT placed patient on bipap per verbal MD order. RT told RN to call MD to put in written order. Patient is tolerating bipap well at this time per settings of 12/6, 50%. RT will continue to monitor.

## 2013-10-07 NOTE — Progress Notes (Signed)
Progress Note [place] TEAM 1 - Stepdown/ICU TEAM   Wendy [name] YQM:[id number] DOB: [birth year] DOA: 10/05/2013 PCP: Mia Creek, MD  Brief narrative: 53 y/o female with severe COPD who presented with cough with brown sputum progressive over a few days not relieved by Zpak. EMS was called for respiratory distress and pt was placed on a BiPAP in the ER.  Subjective: Anxiety is better controlled.  SOB slowly improving but not yet back to baseline.  Denies cp, n/v, or abdom pain.    Assessment/Plan:  Acute respiratory failure/ COPD exacerbation - begin slow taper of steroid dose  - not stable enough to transfer off of the SDU  Anxiety - continue current dose of Xanax   Hyponatremia/ Dehydration - Na has essentially normalized - pt c/o polyuria - slow IVF and follow   Hypokalemia - K+ has normalized- follow trend   Thrombocytopenia, unspecified - stable at this time - etiology unclear - not low enough to incite spontaneous bleeding   Macrocytosis  - could be cause of low plts if B12 or folate markedly depressed - check levels  MRSA screen +  Code Status: FULL Family Communication: spoke w/ pt and wife at bedside  Disposition Plan: follow in SDU - begin slow ambulation w/ support   Consultants: none  Procedures: none  Antibiotics: Levaquin 1/19 >>  DVT prophylaxis: SQ Heparin  Objective: Blood pressure 151/72, pulse 111, temperature 98.1 F (36.7 C), temperature source Oral, resp. rate 14, height 5\' 1"  (1.549 m), weight 48.7 kg (107 lb 5.8 oz), SpO2 100.00%.  Intake/Output Summary (Last 24 hours) at 10/07/13 1332 Last data filed at 10/07/13 1200  Gross per 24 hour  Intake 3039.58 ml  Output   2550 ml  Net 489.58 ml   Exam: General: less anxious - mild resp distress - able to complete only 1 sentence before stopping to breath  Lungs: very distant bs th/o all fields - no active wheeze - no focal crackles  Cardiovascular: Regular rate and rhythm  without murmur gallop or rub - distant HS  Abdomen: Nontender, nondistended, soft, bowel sounds positive, no rebound, no ascites, no appreciable mass Extremities: No significant cyanosis, clubbing, or edema bilateral lower extremities  Data Reviewed: Basic Metabolic Panel:  Recent Labs Lab 10/05/13 2022 10/06/13 0330 10/07/13 0330  NA 134* 129* 136*  K 4.0 3.6* 4.8  CL 90* 86* 95*  CO2 32 28 27  GLUCOSE 174* 154* 107*  BUN 3* 5* 5*  CREATININE 0.46* 0.49* 0.35*  CALCIUM 9.2 8.9 8.8   Liver Function Tests: No results found for this basename: AST, ALT, ALKPHOS, BILITOT, PROT, ALBUMIN,  in the last 168 hours  CBC:  Recent Labs Lab 10/05/13 2022 10/06/13 0330 10/07/13 0530  WBC 11.1* 12.3* 11.2*  NEUTROABS 9.5*  --   --   HGB 13.8 12.4 12.5  HCT 41.0 37.1 37.5  MCV 101.7* 100.0 101.4*  PLT 140* 135* 141*    Recent Results (from the past 240 hour(s))  MRSA PCR SCREENING     Status: Abnormal   Collection Time    10/06/13  2:24 AM      Result Value Range Status   MRSA by PCR POSITIVE (*) NEGATIVE Final   Comment:            The GeneXpert MRSA Assay (FDA     approved for NASAL specimens     only), is one component of a     comprehensive MRSA colonization  surveillance program. It is not     intended to diagnose MRSA     infection nor to guide or     monitor treatment for     MRSA infections.     RESULT CALLED TO, READ BACK BY AND VERIFIED WITH:     IRBY,T RN 0518 10/06/13 MITCHELL,L  CULTURE, EXPECTORATED SPUTUM-ASSESSMENT     Status: None   Collection Time    10/06/13  4:33 PM      Result Value Range Status   Specimen Description SPUTUM   Final   Special Requests Immunocompromised   Final   Sputum evaluation     Final   Value: THIS SPECIMEN IS ACCEPTABLE. RESPIRATORY CULTURE REPORT TO FOLLOW.   Report Status 10/06/2013 FINAL   Final  CULTURE, RESPIRATORY (NON-EXPECTORATED)     Status: None   Collection Time    10/06/13  4:33 PM      Result Value Range  Status   Specimen Description SPUTUM   Final   Special Requests NONE   Final   Gram Stain     Final   Value: MODERATE WBC PRESENT, PREDOMINANTLY PMN     NO SQUAMOUS EPITHELIAL CELLS SEEN     NO ORGANISMS SEEN     Performed at [facility]   Culture     Final   Value: NO GROWTH     Performed at [facility]   Report Status PENDING   Incomplete     Studies:  Recent x-ray studies have been reviewed in detail by the Attending Physician  Scheduled Meds:  Scheduled Meds: . antiseptic oral rinse  15 mL Mouth Rinse BID  . Chlorhexidine Gluconate Cloth  6 each Topical Q0600  . fluticasone  2 spray Each Nare Daily  . heparin  5,000 Units Subcutaneous Q8H  . levofloxacin (LEVAQUIN) IV  750 mg Intravenous Q24H  . methylPREDNISolone (SOLU-MEDROL) injection  60 mg Intravenous Q6H  . mometasone-formoterol  2 puff Inhalation BID  . mupirocin ointment  1 application Nasal BID  . sodium chloride  3 mL Intravenous Q12H  . tiotropium  18 mcg Inhalation Daily    Time spent on care of this patient: 35 min   MCCLUNG,JEFFREY T, MD  [facility] Office  [phone] Pager - Text Page per Loretha Stapler as per below:  On-Call/Text Page:      [email or web address]      password [email or web address]  If 7PM-7AM, please contact night-coverage [email or web address] Password [email or web address] 10/07/2013, 1:32 PM   LOS: 2 days

## 2013-10-08 ENCOUNTER — Inpatient Hospital Stay (HOSPITAL_COMMUNITY): Payer: Medicare Other

## 2013-10-08 ENCOUNTER — Encounter (HOSPITAL_COMMUNITY): Payer: Self-pay | Admitting: Radiology

## 2013-10-08 DIAGNOSIS — J189 Pneumonia, unspecified organism: Principal | ICD-10-CM

## 2013-10-08 LAB — BASIC METABOLIC PANEL
BUN: 8 mg/dL (ref 6–23)
CALCIUM: 8.8 mg/dL (ref 8.4–10.5)
CO2: 34 mEq/L — ABNORMAL HIGH (ref 19–32)
CREATININE: 0.42 mg/dL — AB (ref 0.50–1.10)
Chloride: 95 mEq/L — ABNORMAL LOW (ref 96–112)
GFR calc Af Amer: >90 mL/min (ref >90)
GLUCOSE: 125 mg/dL — AB (ref 70–99)
Potassium: 4 mEq/L (ref 3.7–5.3)
Sodium: 138 mEq/L (ref 137–147)

## 2013-10-08 LAB — BLOOD GAS, ARTERIAL
Acid-Base Excess: 9.8 mmol/L — ABNORMAL HIGH (ref 0.0–2.0)
Bicarbonate: 36.3 mEq/L — ABNORMAL HIGH (ref 20.0–24.0)
DRAWN BY: 347641
Delivery systems: POSITIVE
FIO2: 0.5 %
LHR: 12 {breaths}/min
O2 SAT: 99.7 %
PEEP: 6 cmH2O
PO2 ART: 224 mmHg — AB (ref 80.0–100.0)
Patient temperature: 98.1
TCO2: 38.6 mmol/L (ref 0–100)
pCO2 arterial: 75.3 mmHg (ref 35.0–45.0)
pH, Arterial: 7.302 — ABNORMAL LOW (ref 7.350–7.450)

## 2013-10-08 LAB — VITAMIN B12: Vitamin B-12: 467 pg/mL (ref 211–911)

## 2013-10-08 LAB — FOLATE: Folate: 7.8 ng/mL

## 2013-10-08 LAB — D-DIMER, QUANTITATIVE (NOT AT ARMC): D DIMER QUANT: 0.55 ug{FEU}/mL — AB (ref 0.00–0.48)

## 2013-10-08 MED ORDER — PIPERACILLIN-TAZOBACTAM 3.375 G IVPB
3.3750 g | Freq: Three times a day (TID) | INTRAVENOUS | Status: DC
  Administered 2013-10-08 – 2013-10-09 (×4): 3.375 g via INTRAVENOUS
  Filled 2013-10-08 (×7): qty 50

## 2013-10-08 MED ORDER — SODIUM CHLORIDE 0.45 % IV SOLN: INTRAVENOUS | Status: DC

## 2013-10-08 MED ORDER — IOHEXOL 350 MG/ML SOLN
100.0000 mL | Freq: Once | INTRAVENOUS | Status: AC | PRN
  Administered 2013-10-08: 100 mL via INTRAVENOUS

## 2013-10-08 MED ORDER — LEVOFLOXACIN IN D5W 750 MG/150ML IV SOLN
750.0000 mg | INTRAVENOUS | Status: DC
Start: 2013-10-08 — End: 2013-10-11
  Administered 2013-10-08 – 2013-10-11 (×4): 750 mg via INTRAVENOUS
  Filled 2013-10-08 (×4): qty 150

## 2013-10-08 MED ORDER — SODIUM CHLORIDE 0.9 % IV SOLN
INTRAVENOUS | Status: DC
  Administered 2013-10-08 – 2013-10-09 (×2): via INTRAVENOUS

## 2013-10-08 MED ORDER — VANCOMYCIN HCL 500 MG IV SOLR
500.0000 mg | Freq: Two times a day (BID) | INTRAVENOUS | Status: DC
  Administered 2013-10-08 – 2013-10-10 (×5): 500 mg via INTRAVENOUS
  Filled 2013-10-08 (×10): qty 500

## 2013-10-08 NOTE — Progress Notes (Signed)
PT Cancellation Note  Patient Details Name: Wendy [name] MRN: [id number] DOB: Mar 15, 1961   Cancelled Treatment:    Reason Eval/Treat Not Completed: Patient not medically ready;Medical issues which prohibited therapy. Pt on bi-pap at this time. Will re-attempt evaluation at next available time.    Donnamarie Poag [place], [place] [phone] 10/08/2013, 2:42 PM

## 2013-10-08 NOTE — Progress Notes (Signed)
Pt c/o anxiety and SOB. BP 160-170/90/110s. HR 110s-140s. SpO2 dropped to 70s on 6L [place]. Pt placed on 100% NRB mask, given PRN dose of Xanax, and reassured. NP and RT at bedside to see pt. New orders received. Pt placed on BiPAP and is now resting comfortably. Will continue to monitor closely.

## 2013-10-08 NOTE — Progress Notes (Signed)
PT Cancellation Note  Patient Details Name: Wendy [name] MRN: [id number] DOB: July 30, 1961   Cancelled Treatment:    Reason Eval/Treat Not Completed: Pt refusing at this time. Pt was sitting on EOB but did not want to get OOB. Pt stated "i just dont feel like doing anything today, i just got off bipap and want to eat. I will get up tomorrow". Will re-attempt evaluation tomorrow.   Wendy [name], [place] [phone] 10/08/2013, 4:49 PM

## 2013-10-08 NOTE — Progress Notes (Signed)
ANTIBIOTIC CONSULT NOTE - INITIAL  Pharmacy Consult for Vancomycin/Zosyn/Levaquin  Indication: pneumonia (per CT Angio)  Allergies  Allergen Reactions  . Augmentin [Amoxicillin-Pot Clavulanate] Nausea Only  . Codeine Nausea Only  . Fexofenadine Nausea Only    allegra  . Other     PAIN MEDICATIONS-nausea   Patient Measurements: Height: 5\' 1"  (154.9 cm) Weight: 107 lb 5.8 oz (48.7 kg) IBW/kg (Calculated) : 47.8  Vital Signs: Temp: 97.7 F (36.5 C) (01/22 0400) Temp src: Oral (01/22 0400) BP: 140/94 mmHg (01/22 0400) Pulse Rate: 104 (01/22 0451)  Labs:  Recent Labs  10/05/13 2022 10/06/13 0330 10/07/13 0330 10/07/13 0530 10/08/13 0255  WBC 11.1* 12.3*  --  11.2*  --   HGB 13.8 12.4  --  12.5  --   PLT 140* 135*  --  141*  --   CREATININE 0.46* 0.49* 0.35*  --  0.42*   Estimated Creatinine Clearance: 60.7 ml/min (by C-G formula based on Cr of 0.42).  Microbiology: Recent Results (from the past 720 hour(s))  MRSA PCR SCREENING     Status: Abnormal   Collection Time    10/06/13  2:24 AM      Result Value Range Status   MRSA by PCR POSITIVE (*) NEGATIVE Final   Comment:            The GeneXpert MRSA Assay (FDA     approved for NASAL specimens     only), is one component of a     comprehensive MRSA colonization     surveillance program. It is not     intended to diagnose MRSA     infection nor to guide or     monitor treatment for     MRSA infections.     RESULT CALLED TO, READ BACK BY AND VERIFIED WITH:     IRBY,T RN 0518 10/06/13 MITCHELL,L  CULTURE, EXPECTORATED SPUTUM-ASSESSMENT     Status: None   Collection Time    10/06/13  4:33 PM      Result Value Range Status   Specimen Description SPUTUM   Final   Special Requests Immunocompromised   Final   Sputum evaluation     Final   Value: THIS SPECIMEN IS ACCEPTABLE. RESPIRATORY CULTURE REPORT TO FOLLOW.   Report Status 10/06/2013 FINAL   Final  CULTURE, RESPIRATORY (NON-EXPECTORATED)     Status: None   Collection Time    10/06/13  4:33 PM      Result Value Range Status   Specimen Description SPUTUM   Final   Special Requests NONE   Final   Gram Stain     Final   Value: MODERATE WBC PRESENT, PREDOMINANTLY PMN     NO SQUAMOUS EPITHELIAL CELLS SEEN     NO ORGANISMS SEEN     Performed at 10/08/13   Culture     Final   Value: NO GROWTH     Performed at [facility]   Report Status PENDING   Incomplete    Medical History: Past Medical History  Diagnosis Date  . Bronchitis   . Asthma   . Seasonal allergies   . Poor dentition   . Alcoholism   . COPD (chronic obstructive pulmonary disease)   . Depression   . Oxygen dependent     home oxygen 2L/min   Assessment: 53 y/o F admitted 1/21, now with increased WOB, de-saturations, to be restarted on antibiotics, PNA per CT Angio. Labs as above.   Goal  of Therapy:  Vancomycin trough level 15-20 mcg/ml  Plan:  -Vancomycin 500 mg IV q12h -Zosyn 3.375G IV q8h to be infused over 4 hours -Levaquin 750 mg IV q24h -De-escalate to dual/monotherapy as able -Trend WBC, temp, renal function  -Drug levels as indicated   Abran Duke 10/08/2013,6:04 AM

## 2013-10-08 NOTE — Progress Notes (Signed)
Progress Note [place] TEAM 1 - Stepdown/ICU TEAM   FLO BERROA KPT:[id number] DOB: 1961/04/03 DOA: 10/05/2013 PCP: Mia Creek, MD  Brief narrative: 53 y/o female with severe COPD who presented with cough with brown sputum progressive over a few days not relieved by Zpak. EMS was called for respiratory distress and pt was placed on a BiPAP in the ER.  Subjective: Anxiety is better controlled with Ativan- noted to have respiratory distress with last night. However, was able to sleep last night with BiPAP.     Assessment/Plan:  Acute respiratory failure/ COPD exacerbation/ RUL PNA - cont steroids at current dose  - CT reveals RUL PNA-  started on broad spectrum antibiotics today - will cont BiPAP PRN at bedtime - control anxiety  - not stable enough to transfer off of the SDU  Anxiety - now on Ativan IV which she states is more effective than Xanax- will continue this for now   Hyponatremia/ Dehydration - Na has essentially normalized - pt c/o polyuria - cont slow IVF and follow   Hypokalemia - K+ has normalized- follow trend   Thrombocytopenia, unspecified - stable at this time - etiology unclear - not low enough to incite spontaneous bleeding   Macrocytosis  - B12 and folate levels stable  MRSA screen +  Code Status: FULL Family Communication: spoke w/ pt, children and husband Disposition Plan: follow in SDU -  Consultants: none  Procedures: none  Antibiotics: Levaquin 1/19 >>  DVT prophylaxis: SQ Heparin  Objective: Blood pressure 148/94, pulse 106, temperature 98.9 F (37.2 C), temperature source Axillary, resp. rate 15, height 5\' 1"  (1.549 m), weight 48.7 kg (107 lb 5.8 oz), SpO2 96.00%.  Intake/Output Summary (Last 24 hours) at 10/08/13 1650 Last data filed at 10/08/13 1100  Gross per 24 hour  Intake     20 ml  Output    801 ml  Net   -781 ml   Exam: General: less anxious - mild resp distress - able to complete only 1 sentence before  stopping to breath  Lungs: very distant bs th/o all fields - no active wheeze - no focal crackles  Cardiovascular: Regular rate and rhythm without murmur gallop or rub - distant HS  Abdomen: Nontender, nondistended, soft, bowel sounds positive, no rebound, no ascites, no appreciable mass Extremities: No significant cyanosis, clubbing, or edema bilateral lower extremities  Data Reviewed: Basic Metabolic Panel:  Recent Labs Lab 10/05/13 2022 10/06/13 0330 10/07/13 0330 10/08/13 0255  NA 134* 129* 136* 138  K 4.0 3.6* 4.8 4.0  CL 90* 86* 95* 95*  CO2 32 28 27 34*  GLUCOSE 174* 154* 107* 125*  BUN 3* 5* 5* 8  CREATININE 0.46* 0.49* 0.35* 0.42*  CALCIUM 9.2 8.9 8.8 8.8   Liver Function Tests: No results found for this basename: AST, ALT, ALKPHOS, BILITOT, PROT, ALBUMIN,  in the last 168 hours  CBC:  Recent Labs Lab 10/05/13 2022 10/06/13 0330 10/07/13 0530  WBC 11.1* 12.3* 11.2*  NEUTROABS 9.5*  --   --   HGB 13.8 12.4 12.5  HCT 41.0 37.1 37.5  MCV 101.7* 100.0 101.4*  PLT 140* 135* 141*    Recent Results (from the past 240 hour(s))  MRSA PCR SCREENING     Status: Abnormal   Collection Time    10/06/13  2:24 AM      Result Value Range Status   MRSA by PCR POSITIVE (*) NEGATIVE Final   Comment:  The GeneXpert MRSA Assay (FDA     approved for NASAL specimens     only), is one component of a     comprehensive MRSA colonization     surveillance program. It is not     intended to diagnose MRSA     infection nor to guide or     monitor treatment for     MRSA infections.     RESULT CALLED TO, READ BACK BY AND VERIFIED WITH:     IRBY,T RN 0518 10/06/13 MITCHELL,L  CULTURE, EXPECTORATED SPUTUM-ASSESSMENT     Status: None   Collection Time    10/06/13  4:33 PM      Result Value Range Status   Specimen Description SPUTUM   Final   Special Requests Immunocompromised   Final   Sputum evaluation     Final   Value: THIS SPECIMEN IS ACCEPTABLE. RESPIRATORY  CULTURE REPORT TO FOLLOW.   Report Status 10/06/2013 FINAL   Final  CULTURE, RESPIRATORY (NON-EXPECTORATED)     Status: None   Collection Time    10/06/13  4:33 PM      Result Value Range Status   Specimen Description SPUTUM   Final   Special Requests NONE   Final   Gram Stain     Final   Value: MODERATE WBC PRESENT, PREDOMINANTLY PMN     NO SQUAMOUS EPITHELIAL CELLS SEEN     NO ORGANISMS SEEN     Performed at [facility]   Culture     Final   Value: FEW CANDIDA ALBICANS     Performed at [facility]   Report Status PENDING   Incomplete     Studies:  Recent x-ray studies have been reviewed in detail by the Attending Physician  Scheduled Meds:  Scheduled Meds: . antiseptic oral rinse  15 mL Mouth Rinse BID  . budesonide (PULMICORT) nebulizer solution  0.25 mg Nebulization BID  . Chlorhexidine Gluconate Cloth  6 each Topical Q0600  . fluticasone  2 spray Each Nare Daily  . guaiFENesin  600 mg Oral BID  . heparin  5,000 Units Subcutaneous Q8H  . ipratropium-albuterol  3 mL Nebulization QID  . levofloxacin (LEVAQUIN) IV  750 mg Intravenous Q24H  . methylPREDNISolone (SOLU-MEDROL) injection  60 mg Intravenous Q8H  . mupirocin ointment  1 application Nasal BID  . piperacillin-tazobactam (ZOSYN)  IV  3.375 g Intravenous Q8H  . sodium chloride  3 mL Intravenous Q12H  . vancomycin  500 mg Intravenous Q12H    Time spent on care of this patient: 35 min   Adlynn Lowenstein, MD  [facility] Office  [phone] Pager - Text Page per Loretha Stapler as per below:  On-Call/Text Page:      [email or web address]      password [email or web address]  If 7PM-7AM, please contact night-coverage [email or web address] Password [facility] 10/08/2013, 4:50 PM   LOS: 3 days

## 2013-10-09 DIAGNOSIS — J9620 Acute and chronic respiratory failure, unspecified whether with hypoxia or hypercapnia: Secondary | ICD-10-CM

## 2013-10-09 LAB — BLOOD GAS, ARTERIAL
ACID-BASE EXCESS: 11 mmol/L — AB (ref 0.0–2.0)
Bicarbonate: 36.7 mEq/L — ABNORMAL HIGH (ref 20.0–24.0)
Drawn by: 358491
FIO2: 0.4 %
O2 SAT: 96.6 %
Patient temperature: 98.5
TCO2: 38.8 mmol/L (ref 0–100)
pCO2 arterial: 66.3 mmHg (ref 35.0–45.0)
pH, Arterial: 7.362 (ref 7.350–7.450)
pO2, Arterial: 82.9 mmHg (ref 80.0–100.0)

## 2013-10-09 LAB — BASIC METABOLIC PANEL
BUN: 6 mg/dL (ref 6–23)
CALCIUM: 8.1 mg/dL — AB (ref 8.4–10.5)
CHLORIDE: 88 meq/L — AB (ref 96–112)
CO2: 33 meq/L — AB (ref 19–32)
Creatinine, Ser: 0.35 mg/dL — ABNORMAL LOW (ref 0.50–1.10)
GFR calc Af Amer: >90 mL/min (ref >90)
GFR calc non Af Amer: >90 mL/min (ref >90)
Glucose, Bld: 107 mg/dL — ABNORMAL HIGH (ref 70–99)
Potassium: 3.3 mEq/L — ABNORMAL LOW (ref 3.7–5.3)
SODIUM: 131 meq/L — AB (ref 137–147)

## 2013-10-09 LAB — CBC
HCT: 35.1 % — ABNORMAL LOW (ref 36.0–46.0)
HEMOGLOBIN: 11.7 g/dL — AB (ref 12.0–15.0)
MCH: 33.1 pg (ref 26.0–34.0)
MCHC: 33.3 g/dL (ref 30.0–36.0)
MCV: 99.4 fL (ref 78.0–100.0)
Platelets: 171 10*3/uL (ref 150–400)
RBC: 3.53 MIL/uL — ABNORMAL LOW (ref 3.87–5.11)
RDW: 12.2 % (ref 11.5–15.5)
WBC: 5 10*3/uL (ref 4.0–10.5)

## 2013-10-09 LAB — CULTURE, RESPIRATORY

## 2013-10-09 LAB — CULTURE, RESPIRATORY W GRAM STAIN

## 2013-10-09 LAB — GLUCOSE, CAPILLARY
GLUCOSE-CAPILLARY: 100 mg/dL — AB (ref 70–99)
GLUCOSE-CAPILLARY: 109 mg/dL — AB (ref 70–99)
GLUCOSE-CAPILLARY: 129 mg/dL — AB (ref 70–99)

## 2013-10-09 LAB — PHOSPHORUS: PHOSPHORUS: 1.8 mg/dL — AB (ref 2.3–4.6)

## 2013-10-09 LAB — TROPONIN I

## 2013-10-09 LAB — LACTIC ACID, PLASMA: LACTIC ACID, VENOUS: 0.6 mmol/L (ref 0.5–2.2)

## 2013-10-09 LAB — PRO B NATRIURETIC PEPTIDE: PRO B NATRI PEPTIDE: 1726 pg/mL — AB (ref 0–125)

## 2013-10-09 LAB — MAGNESIUM: Magnesium: 2.4 mg/dL (ref 1.5–2.5)

## 2013-10-09 MED ORDER — ALBUTEROL SULFATE (2.5 MG/3ML) 0.083% IN NEBU
2.5000 mg | INHALATION_SOLUTION | RESPIRATORY_TRACT | Status: DC
  Administered 2013-10-09: 2.5 mg via RESPIRATORY_TRACT
  Filled 2013-10-09: qty 3

## 2013-10-09 MED ORDER — ALPRAZOLAM 0.25 MG PO TABS
0.2500 mg | ORAL_TABLET | Freq: Three times a day (TID) | ORAL | Status: DC | PRN
  Administered 2013-10-10 (×2): 0.25 mg via ORAL
  Filled 2013-10-09 (×2): qty 1

## 2013-10-09 MED ORDER — LORAZEPAM 2 MG/ML IJ SOLN
0.5000 mg | INTRAMUSCULAR | Status: DC | PRN
  Administered 2013-10-09: 0.5 mg via INTRAVENOUS
  Filled 2013-10-09: qty 1

## 2013-10-09 MED ORDER — METHYLPREDNISOLONE SODIUM SUCC 125 MG IJ SOLR
60.0000 mg | Freq: Two times a day (BID) | INTRAMUSCULAR | Status: DC
  Filled 2013-10-09 (×2): qty 0.96

## 2013-10-09 MED ORDER — ONDANSETRON HCL 4 MG/2ML IJ SOLN
4.0000 mg | Freq: Four times a day (QID) | INTRAMUSCULAR | Status: DC | PRN
  Administered 2013-10-09 – 2013-10-11 (×5): 4 mg via INTRAVENOUS
  Filled 2013-10-09 (×5): qty 2

## 2013-10-09 MED ORDER — PANTOPRAZOLE SODIUM 40 MG IV SOLR
40.0000 mg | INTRAVENOUS | Status: DC
  Administered 2013-10-09: 40 mg via INTRAVENOUS
  Filled 2013-10-09 (×3): qty 40

## 2013-10-09 MED ORDER — LORAZEPAM 2 MG/ML IJ SOLN
INTRAMUSCULAR | Status: AC
  Filled 2013-10-09: qty 1

## 2013-10-09 MED ORDER — METHYLPREDNISOLONE SODIUM SUCC 125 MG IJ SOLR
80.0000 mg | Freq: Three times a day (TID) | INTRAMUSCULAR | Status: DC
  Administered 2013-10-09 – 2013-10-11 (×6): 80 mg via INTRAVENOUS
  Filled 2013-10-09 (×3): qty 1.28
  Filled 2013-10-09: qty 2
  Filled 2013-10-09 (×6): qty 1.28

## 2013-10-09 MED ORDER — MAGNESIUM SULFATE 40 MG/ML IJ SOLN
2.0000 g | Freq: Once | INTRAMUSCULAR | Status: AC
  Administered 2013-10-09: 2 g via INTRAVENOUS
  Filled 2013-10-09 (×2): qty 50

## 2013-10-09 MED ORDER — IPRATROPIUM-ALBUTEROL 0.5-2.5 (3) MG/3ML IN SOLN
3.0000 mL | RESPIRATORY_TRACT | Status: DC
  Administered 2013-10-09 – 2013-10-10 (×4): 3 mL via RESPIRATORY_TRACT
  Filled 2013-10-09 (×4): qty 3

## 2013-10-09 MED ORDER — OSELTAMIVIR PHOSPHATE 75 MG PO CAPS
75.0000 mg | ORAL_CAPSULE | Freq: Two times a day (BID) | ORAL | Status: DC
  Administered 2013-10-10 (×2): 75 mg via ORAL
  Filled 2013-10-09 (×5): qty 1

## 2013-10-09 MED ORDER — IPRATROPIUM BROMIDE 0.02 % IN SOLN: 0.5000 mg | RESPIRATORY_TRACT | Status: DC

## 2013-10-09 MED ORDER — LORAZEPAM 2 MG/ML IJ SOLN: 0.5000 mg | INTRAMUSCULAR | Status: DC | PRN

## 2013-10-09 MED ORDER — MORPHINE SULFATE 2 MG/ML IJ SOLN
1.0000 mg | INTRAMUSCULAR | Status: DC | PRN
  Administered 2013-10-09: 2 mg via INTRAVENOUS
  Administered 2013-10-09: 3 mg via INTRAVENOUS
  Administered 2013-10-10 – 2013-10-11 (×7): 2 mg via INTRAVENOUS
  Filled 2013-10-09 (×4): qty 1
  Filled 2013-10-09: qty 2
  Filled 2013-10-09 (×2): qty 1
  Filled 2013-10-09: qty 2
  Filled 2013-10-09: qty 1

## 2013-10-09 NOTE — Progress Notes (Signed)
Progress Note [facility] TEAM 1 - Stepdown/ICU TEAM    Wendy [name]:[id number] DOB: 06-10-1961 DOA: 10/05/2013 PCP: Mia Creek, MD  Brief narrative: 53 y/o female with severe COPD who presented with cough with brown sputum progressive over a few days not relieved by Zpak. EMS was called for respiratory distress and pt was placed on a BiPAP in the ER.  Subjective: The pt cotinues to have difficulty breathing.  She is also quite anxious, and this is causing difficulty w/ her complying w/ BIPAP/CPAP.  She denies cp, n/v, or abdom pain.    Assessment/Plan:  Acute respiratory failure / COPD exacerbation / RUL CAP - cont steroids + nebs   - CT revealed RUL PNA  - will cont BiPAP PRN at bedtime - control anxiety  - not stable enough to transfer off of the SDU - in that sx have not markedly improved, I have asked her Pulmonologist to see her in consultation   Scattered Pulmonary nodules  - noted via CT chest - max diam is 43mm - f/u CT chest will be needed in 6 months   Anxiety - on Ativan IV which she states is more effective than Xanax - will continue this for now but must avoid oversedation    Hyponatremia/ Dehydration - Na has normalized - stop IVF and follow   Hypokalemia - K+ has normalized - follow trend   Thrombocytopenia, unspecified - stable at this time - etiology unclear - not low enough to incite spontaneous bleeding - appears to be slowly improving   Macrocytosis  - B12 and folate levels stable  MRSA screen + - usual contact precautions   Code Status: FULL Family Communication: spoke w/ pt, children and husband at bedside  Disposition Plan: SDU  Consultants: Pulmonary   Procedures: none  Antibiotics: Levaquin 1/19 >> Zosyn 1/22 Vanc 1/21 >>  DVT prophylaxis: SQ Heparin  Objective: Blood pressure 168/81, pulse 108, temperature 97.8 F (36.6 C), temperature source Oral, resp. rate 20, height 5\' 1"  (1.549 m), weight 48.7 kg (107 lb 5.8 oz),  SpO2 99.00%.  Intake/Output Summary (Last 24 hours) at 10/09/13 1225 Last data filed at 10/09/13 0600  Gross per 24 hour  Intake   1975 ml  Output    100 ml  Net   1875 ml   Exam: General: remains anxious - mild resp distress / pursed lip breathing  Lungs: very distant bs th/o all fields - no active wheeze - no focal crackles  Cardiovascular: regular rate and rhythm without murmur gallop or rub - distant HS  Abdomen: nontender, nondistended, soft, bowel sounds positive, no rebound, no ascites, no appreciable mass Extremities: no significant cyanosis, clubbing, or edema bilateral lower extremities  Data Reviewed: Basic Metabolic Panel:  Recent Labs Lab 10/05/13 2022 10/06/13 0330 10/07/13 0330 10/08/13 0255  NA 134* 129* 136* 138  K 4.0 3.6* 4.8 4.0  CL 90* 86* 95* 95*  CO2 32 28 27 34*  GLUCOSE 174* 154* 107* 125*  BUN 3* 5* 5* 8  CREATININE 0.46* 0.49* 0.35* 0.42*  CALCIUM 9.2 8.9 8.8 8.8   Liver Function Tests: No results found for this basename: AST, ALT, ALKPHOS, BILITOT, PROT, ALBUMIN,  in the last 168 hours  CBC:  Recent Labs Lab 10/05/13 2022 10/06/13 0330 10/07/13 0530  WBC 11.1* 12.3* 11.2*  NEUTROABS 9.5*  --   --   HGB 13.8 12.4 12.5  HCT 41.0 37.1 37.5  MCV 101.7* 100.0 101.4*  PLT 140* 135*  141*    Recent Results (from the past 240 hour(s))  MRSA PCR SCREENING     Status: Abnormal   Collection Time    10/06/13  2:24 AM      Result Value Range Status   MRSA by PCR POSITIVE (*) NEGATIVE Final   Comment:            The GeneXpert MRSA Assay (FDA     approved for NASAL specimens     only), is one component of a     comprehensive MRSA colonization     surveillance program. It is not     intended to diagnose MRSA     infection nor to guide or     monitor treatment for     MRSA infections.     RESULT CALLED TO, READ BACK BY AND VERIFIED WITH:     IRBY,T RN 0518 10/06/13 MITCHELL,L  CULTURE, EXPECTORATED SPUTUM-ASSESSMENT     Status: None    Collection Time    10/06/13  4:33 PM      Result Value Range Status   Specimen Description SPUTUM   Final   Special Requests Immunocompromised   Final   Sputum evaluation     Final   Value: THIS SPECIMEN IS ACCEPTABLE. RESPIRATORY CULTURE REPORT TO FOLLOW.   Report Status 10/06/2013 FINAL   Final  CULTURE, RESPIRATORY (NON-EXPECTORATED)     Status: None   Collection Time    10/06/13  4:33 PM      Result Value Range Status   Specimen Description SPUTUM   Final   Special Requests NONE   Final   Gram Stain     Final   Value: MODERATE WBC PRESENT, PREDOMINANTLY PMN     NO SQUAMOUS EPITHELIAL CELLS SEEN     NO ORGANISMS SEEN     Performed at [facility]   Culture     Final   Value: FEW CANDIDA ALBICANS     Performed at [facility]   Report Status 10/09/2013 FINAL   Final     Studies:  Recent x-ray studies have been reviewed in detail by the Attending Physician  Scheduled Meds:  Scheduled Meds: . antiseptic oral rinse  15 mL Mouth Rinse BID  . budesonide (PULMICORT) nebulizer solution  0.25 mg Nebulization BID  . Chlorhexidine Gluconate Cloth  6 each Topical Q0600  . fluticasone  2 spray Each Nare Daily  . guaiFENesin  600 mg Oral BID  . heparin  5,000 Units Subcutaneous Q8H  . ipratropium-albuterol  3 mL Nebulization QID  . levofloxacin (LEVAQUIN) IV  750 mg Intravenous Q24H  . LORazepam      . methylPREDNISolone (SOLU-MEDROL) injection  60 mg Intravenous Q8H  . mupirocin ointment  1 application Nasal BID  . piperacillin-tazobactam (ZOSYN)  IV  3.375 g Intravenous Q8H  . sodium chloride  3 mL Intravenous Q12H  . vancomycin  500 mg Intravenous Q12H    Time spent on care of this patient: 35 min   Shelby Peltz T, MD  [facility] Office  [phone] Pager - Text Page per Loretha Stapler as per below:  On-Call/Text Page:      [email or web address]      password [email or web address]  If 7PM-7AM, please contact night-coverage [email or web address] Password [facility] 10/09/2013, 12:25  PM   LOS: 4 days

## 2013-10-09 NOTE — Progress Notes (Signed)
Utilization review completed.  

## 2013-10-09 NOTE — Progress Notes (Signed)
Physician notified: Sharon Seller At: 0932  Regarding: Repage. No PRN for anxiety while off BiPAP.  Awaiting return response.   Orders placed in eMAR.

## 2013-10-09 NOTE — Progress Notes (Signed)
Physician notified: Sharon Seller  At: 0916  Regarding: Very anxious. Ativan ordered only while on BiPAP. Refused BiPAP at 0400. Xanax DC yesterday. Pt was more lethargic with this AM assessment.  Awaiting return response.   Re paged.

## 2013-10-09 NOTE — Progress Notes (Signed)
Pt arrived to 2M07 in obvious respiratory distress on 8lpm [place]. Neb treatment given as well as PRN Morphine to help with SOB. Pt has very diminished bbs, and is currently on 5lpm [place]. Pt refusing BIPAP at this time. Pt states she wants to try and regain her composure at this time, and see if she can handle episode without BIPAP. We assured pt we weren't trying to rush her or push her into doing anything she's not sure about doing. Pt states she just wants to see how she feels in a little while. Pt currently sitting up in bed, pursed lip breathing. SpO2 is 95% on 5lpm [place]. RT will continue to monitor.

## 2013-10-09 NOTE — Progress Notes (Signed)
  PT Cancellation Note  Patient Details Name: Wendy [name] MRN: [id number] DOB: 1960-11-07   Cancelled Treatment:     Pt continues to refuse therapy. Pt reported "i just haven't gotten any sleep and i want to eat. Maybe you can check back with me later". Pt educated on importance of OOB activities to prevent deconditioning. Pt continues to refuse. Will re-attempt at next available time.    Donnamarie Poag [place], [place] [phone] 10/09/2013, 1:25 PM

## 2013-10-09 NOTE — Progress Notes (Addendum)
1635: Report given to 72M RN. All belongings sent with patient including medications and chart. Hydralazine IVP given prior to transfer for BP 175/92. Flu swab completed prior to transfer.  1710: Pt transferred by 2 RN via bed on 8 liters oxygen per nasal cannula. Pt tolerated wearing mask during transport. Family in waiting room. Pt moved to ICU bed with RN x4 and respiratory x2 at bedside. Attempted to call ABG results to CCM.

## 2013-10-09 NOTE — Progress Notes (Signed)
Physician notified: Cindee Lame, NP  At: 1705  Regarding: ABG results 7.35/82.2/66.5/36.7 Awaiting return response.   Physician notified: Cindee Lame, NP  At: 1727  Regarding: ABG results.  No return response. 1733  Physician notified: Darlys Gales At: 38  Regarding: ABG results. Pt now in 2M07  No return response. 1733

## 2013-10-09 NOTE — Consult Note (Signed)
Name: Wendy [name] MRN: [id number] DOB: January 03, 1961    ADMISSION DATE:  10/05/2013 CONSULTATION DATE:  10/09/2013    REFERRING MD :  Sharon Seller PRIMARY SERVICE:  [facility]  CHIEF COMPLAINT:  Acute Respiratory Failure  BRIEF PATIENT DESCRIPTION: 53 year old female who is a former smoker, and followed by Dr. Maple Hudson. She is on 2.5 L/min O2 at home. Presented 1/19 via EMS on CPAP in acute respiratory failure. She was admitted for AECOPD. She has had BiPAP PRN since that time with minimal improvement. PCCM has been asked to see.   SIGNIFICANT EVENTS / STUDIES:  02/13/13 - PFT > FVC 1.23/39%, FEV1 0.48/19%, FEV1/FVC 0.39/49%, FEF 25-75% 0.18/7%, DLCO 46%. 1/19 - Admitted to [facility] 1/22 - CTA Chest > 1. No evidence of pulmonary embolus. 2. Focal right upper lobe pneumonia noted. 3. Few scattered small pulmonary nodules seen, measuring up to 5 mm in size. If the patient is at high risk for bronchogenic carcinoma,  follow-up chest CT at 6-12 months is recommended. If the patient is at low risk for bronchogenic carcinoma, follow-up chest CT at 12 months is recommended. 1/23 - PCCM asked to consult  LINES / TUBES: PIV  CULTURES: 1/20 - Sputum Culture > few Candida Albicans 1/20 - MRSA PCR > Pos  ANTIBIOTICS: Levaquin 1/21 >>> Vanco 1/22 >>> Zosyn 1/22>23 Tamiflu 1/23 >>>  HISTORY OF PRESENT ILLNESS:  53 year old female who sees Dr. Maple Hudson as an outpatient. She is a former smoker who is on chronic prednisone and 2.5 L/min of O2 at home via [place]. Most recent PFTs are from 01/2013 and are listed above. She first contacted the office 1/13 complaining of increased DOE, nonproductive cough, and Fever. She was given a zpack and prednisone taper. She contacted the office again 1/19 stating little improvement and a now productive cough with green sputum. She was started on another prednisone taper. That evening EMS was called and arrived to find the patient with inaudible breath sounds, in severe respiratory distress  / failure. She was placed on CPAP and given Solumedrol and improved enough to avoid intubation. She was admitted to the step down unit. 1/21 she decompensated again, had some improvement with BiPAP, and was treated for HAP. 1/23 She has not had much improvement in her respiratory status and is still requiring BiPAP. PCCM has been asked to see.   PAST MEDICAL HISTORY :  Past Medical History  Diagnosis Date  . Bronchitis   . Asthma   . Seasonal allergies   . Poor dentition   . Alcoholism   . COPD (chronic obstructive pulmonary disease)   . Depression   . Oxygen dependent     home oxygen 2L/min   Past Surgical History  Procedure Laterality Date  . Cesarean section      x 2  . Tubal ligation     Prior to Admission medications   Medication Sig Start Date End Date Taking? Authorizing Provider  albuterol (PROVENTIL HFA;VENTOLIN HFA) 108 (90 BASE) MCG/ACT inhaler Inhale 2-3 puffs into the lungs every 4 (four) hours as needed for wheezing or shortness of breath (**Must last 30 days- per dr young**).   Yes Historical Provider, MD  albuterol (PROVENTIL) (2.5 MG/3ML) 0.083% nebulizer solution Take 3 mLs (2.5 mg total) by nebulization 4 (four) times daily. DX.  492.8 07/10/13 07/10/14 Yes Waymon Budge, MD  ALPRAZolam Prudy Feeler) 1 MG tablet Take 1 tablet (1 mg total) by mouth 3 (three) times daily as needed for anxiety or  sleep. 08/17/13  Yes Waymon Budge, MD  Emollient (EUCERIN) lotion Apply 1 Bottle topically as needed for dry skin (to body).   Yes Historical Provider, MD  fluticasone (FLONASE) 50 MCG/ACT nasal spray Place 2 sprays into the nose daily. 03/05/13 03/06/14 Yes Clinton D Young, MD  Fluticasone-Salmeterol (ADVAIR DISKUS) 500-50 MCG/DOSE AEPB Inhale 1 puff into the lungs 2 (two) times daily. 07/17/13  Yes Waymon Budge, MD  ibuprofen (ADVIL) 200 MG tablet Take 3 tablets (600 mg total) by mouth every 6 (six) hours as needed for pain (side pain.  Use sparingly as NSAIDs can worsen your  breathing.). 12/01/12  Yes Marianne L York, PA-C  predniSONE (DELTASONE) 10 MG tablet Take 4x2 days,3x2days,2x2days,1x2days, then stop 10/05/13  Yes Waymon Budge, MD  tiotropium (SPIRIVA) 18 MCG inhalation capsule Place 18 mcg into inhaler and inhale daily.   Yes Historical Provider, MD   Allergies  Allergen Reactions  . Augmentin [Amoxicillin-Pot Clavulanate] Nausea Only  . Codeine Nausea Only  . Fexofenadine Nausea Only    allegra  . Other     PAIN MEDICATIONS-nausea    FAMILY HISTORY:  Family History  Problem Relation Age of Onset  . Lung cancer Father   . COPD Father    SOCIAL HISTORY:  reports that she quit smoking about 21 months ago. Her smoking use included Cigarettes. She has a 40 pack-year smoking history. She has never used smokeless tobacco. She reports that she drinks about 9.0 ounces of alcohol per week. She reports that she does not use illicit drugs.  REVIEW OF SYSTEMS:   Constitutional: +fever/chills(resolved), +malaise/fatigue. no diaphoresis, weight loss.  HENT: Negative for hearing loss, ear pain, congestion, sore throat, neck pain, tinnitus and ear discharge.   Eyes: Negative for blurred vision, double vision, photophobia, pain, discharge and redness.  Respiratory: +cough, productive with green/brown sputum, shortness of breath, no hemoptysis, wheezing, and stridor.   Cardiovascular: Negative for chest pain, palpitations, orthopnea, claudication, leg swelling and PND.  Gastrointestinal: Negative for heartburn, nausea, vomiting, abdominal pain, diarrhea, constipation, blood in stool and melena.  Genitourinary: Negative for dysuria, urgency, frequency, hematuria and flank pain.  Musculoskeletal: Negative for myalgias, back pain, joint pain and falls.  Skin: Negative for itching and rash.  Neurological: Negative for dizziness, tingling, tremors, sensory change, speech change, focal weakness, seizures, loss of consciousness, weakness and headaches.    SUBJECTIVE:   SOB today on 5L O2. Subjectively feels no better than at time of admission.   VITAL SIGNS: Temp:  [97.8 F (36.6 C)-98.9 F (37.2 C)] 97.8 F (36.6 C) (01/23 1128) Pulse Rate:  [99-109] 108 (01/23 0849) Resp:  [15-20] 20 (01/23 0849) BP: (148-176)/(81-119) 168/81 mmHg (01/23 0849) SpO2:  [96 %-99 %] 99 % (01/23 1149) FiO2 (%):  [35 %] 35 % (01/23 0850)  PHYSICAL EXAMINATION: General:  53 year old female in mild respiratory distress on 5L O2 Neuro:  Awake, oriented. Drowsy  HEENT:  [place], PERRL Neck:  Supple, no JVD observed.  Cardiovascular:  RRR, no murmur, click, gallop Lungs:  Scattered expiatory wheeze, slight upper airway sounds.  Abdomen:  Soft, non-tender, non-distended.  Musculoskeletal:  intact Skin:  intact   PULMONARY  Recent Labs Lab 10/05/13 2100 10/05/13 2315 10/07/13 2146 10/08/13 0212 10/09/13 1655  PHART 7.299* 7.324* 7.271* 7.302* 7.362  PCO2ART 71.6* 64.0* 76.3* 75.3* 66.3*  PO2ART 175.0* 78.0* 389.0* 224.0* 82.9  HCO3 35.2* 33.3* 34.1* 36.3* 36.7*  TCO2 37 35 36.5 38.6 38.8  O2SAT 99.0 94.0  99.8 99.7 96.6    CBC  Recent Labs Lab 10/05/13 2022 10/06/13 0330 10/07/13 0530  HGB 13.8 12.4 12.5  HCT 41.0 37.1 37.5  WBC 11.1* 12.3* 11.2*  PLT 140* 135* 141*    COAGULATION No results found for this basename: INR,  in the last 168 hours  CARDIAC  No results found for this basename: TROPONINI,  in the last 168 hours No results found for this basename: PROBNP,  in the last 168 hours   CHEMISTRY  Recent Labs Lab 10/05/13 2022 10/06/13 0330 10/07/13 0330 10/08/13 0255  NA 134* 129* 136* 138  K 4.0 3.6* 4.8 4.0  CL 90* 86* 95* 95*  CO2 32 28 27 34*  GLUCOSE 174* 154* 107* 125*  BUN 3* 5* 5* 8  CREATININE 0.46* 0.49* 0.35* 0.42*  CALCIUM 9.2 8.9 8.8 8.8   Estimated Creatinine Clearance: 60.7 ml/min (by C-G formula based on Cr of 0.42).   LIVER No results found for this basename: AST, ALT, ALKPHOS, BILITOT, PROT, ALBUMIN, INR,   in the last 168 hours   INFECTIOUS No results found for this basename: LATICACIDVEN, PROCALCITON,  in the last 168 hours   ENDOCRINE CBG (last 3)  No results found for this basename: GLUCAP,  in the last 72 hours       IMAGING x48h  Ct Angio Chest Pe W/cm &/or Wo Cm  10/08/2013   CLINICAL DATA:  Elevated D-dimer.  Hypoxia and tachypnea.  EXAM: CT ANGIOGRAPHY CHEST WITH CONTRAST  TECHNIQUE: Multidetector CT imaging of the chest was performed using the standard protocol during bolus administration of intravenous contrast. Multiplanar CT image reconstructions including MIPs were obtained to evaluate the vascular anatomy.  CONTRAST:  OMNIPAQUE IOHEXOL 350 MG/ML SOLN  COMPARISON:  DG CHEST 1V PORT dated 10/07/2013; CT ANGIO CHEST W/CM &/OR WO/CM dated 09/18/2010  FINDINGS: There is no evidence of pulmonary embolus.  Focal airspace opacity is seen near the right lung apex, compatible with a mild infectious process. A few scattered small pulmonary nodules are seen, with a 3 mm pulmonary nodule at the right lung apex, 4 mm nodule at the left lung apex (both on image 14 of 98), and a 5 mm nodule along the left major fissure (image 25 of 98). There is no evidence of pleural effusion or pneumothorax. No masses are identified; no abnormal focal contrast enhancement is seen.  The mediastinum is unremarkable in appearance. No mediastinal lymphadenopathy is seen. No pericardial effusion is identified. The great vessels are grossly unremarkable in appearance. No axillary lymphadenopathy is seen. The visualized portions of the thyroid gland are unremarkable in appearance.  The visualized portions of the liver and spleen are unremarkable.  No acute osseous abnormalities are seen.  Review of the MIP images confirms the above findings.  IMPRESSION: 1. No evidence of pulmonary embolus. 2. Focal right upper lobe pneumonia noted. 3. Few scattered small pulmonary nodules seen, measuring up to 5 mm in size. If the  patient is at high risk for bronchogenic carcinoma, follow-up chest CT at 6-12 months is recommended. If the patient is at low risk for bronchogenic carcinoma, follow-up chest CT at 12 months is recommended. This recommendation follows the consensus statement: Guidelines for Management of Small Pulmonary Nodules Detected on CT Scans: A Statement from the Fleischner Society as published in Radiology 2005;237:395-400.   Electronically Signed   By: Roanna Raider M.D.   On: 10/08/2013 05:07   Dg Chest Port 1 View  10/07/2013  CLINICAL DATA:  Shortness of breath.  EXAM: PORTABLE CHEST - 1 VIEW  COMPARISON:  Chest radiograph performed 10/05/2013  FINDINGS: The lungs are hyperexpanded, with flattening of the hemidiaphragms, compatible with COPD. There is no evidence of focal opacification, pleural effusion or pneumothorax.  The cardiomediastinal silhouette is borderline normal in size. No acute osseous abnormalities are seen.  IMPRESSION: Findings of COPD; no acute cardiopulmonary process seen.   Electronically Signed   By: Roanna Raider M.D.   On: 10/07/2013 23:14      ASSESSMENT / PLAN:  PULMONARY A: Acute on Chronic Hypercapnic Respiratory Failure: in setting of AECOP. ?HCAP ?Flu   - staff note: poor response due to viral virulence + host factors. Will rule out CHF P:   -Transfer to ICU -Respiratory viral panel and flu [panel -Continuous BiPAP -Solumedrol -aggressive BDs = stat MAG -Cough suppression -f/u CT in 6 months. - morphine prn if needed (check ABG first)   CARDIOVASCULAR A: Hypertention P:  PRN Hydralazine  Cardiac monitoring Rule out CHF and MI via biomarkers  RENAL A:  No Acute Issue P:   Supportive Care  GASTROINTESTINAL A:  GI pprphylaxis P:   PPI  HEMATOLOGIC A:  VTE prophylaxis P:  SQ heparin  INFECTIOUS A:  RUL CAP/ possible flu P:   Vanc Levaquin Tamiflu  ENDOCRINE A:  No Acute Issue - but NPO on steroids.   P:   Monitor Glucose while NPO SSI  in glucose consistently > 180  NEUROLOGIC A:  Anxiety - now lethargic possibly r/t ativan P:   D/c benzos Will use precedex as drug of choice if agitated in unit.  Can use juidicious morphine for dyspnea  TODAY'S SUMMARY: Patient is markedly SOB on 5L/min O2. Start continuous BiPAP. Transfer to ICU. Discussed possible need for intubation with family and they were undecided as to what they want to do if that situation arises. Full Code.     Joneen Roach, ACNP [facility] Pulmonology/Critical Care Pager [phone] or [phone] 10/09/2013, 2:55 PM  STAFF NOTE: I, Dr Lavinia Sharps have personally reviewed patient's available data, including medical history, events of note, physical examination and test results as part of my evaluation. I have discussed with resident/NP and other care providers such as pharmacist, RN and RRT.  In addition,  I personally evaluated patient and elicited key findings of acute on chronic hypercapnic resp failure. Slowly declining possibly due to very sever copd (host factors) and likely viral virulence. Will move to ICU for bipap. She is undecided on Intubation (she and family got emotional). Full code for now. Reset  Rest per NP/medical resident whose note is outlined above and that I agree with  The patient is critically ill with multiple organ systems failure and requires high complexity decision making for assessment and support, frequent evaluation and titration of therapies, application of advanced monitoring technologies and extensive interpretation of multiple databases.   Critical Care Time devoted to patient care services described in this note is  35  Minutes independent of NP time  Dr. Kalman Shan, M.D., [facility].C.P Pulmonary and Critical Care Medicine Staff Physician [facility] Pager: [phone], If no answer or between  15:00h - 7:00h: call [phone]  10/09/2013 5:24 PM

## 2013-10-09 NOTE — Progress Notes (Signed)
eLink Physician-Brief Progress Note Patient Name: Wendy [name] DOB: 1961/07/20 MRN: [id number]  Date of Service  10/09/2013   HPI/Events of Note   Responded well to morphine, comfortable on camera check  eICU Interventions  Order clarification: duoneb q4, albuterol q2h prn   Intervention Category Intermediate Interventions: Respiratory distress - evaluation and management  Dequane Strahan 10/09/2013, 5:59 PM

## 2013-10-10 DIAGNOSIS — I369 Nonrheumatic tricuspid valve disorder, unspecified: Secondary | ICD-10-CM

## 2013-10-10 LAB — BASIC METABOLIC PANEL
BUN: 8 mg/dL (ref 6–23)
CALCIUM: 8.4 mg/dL (ref 8.4–10.5)
CO2: 35 mEq/L — ABNORMAL HIGH (ref 19–32)
CREATININE: 0.39 mg/dL — AB (ref 0.50–1.10)
Chloride: 90 mEq/L — ABNORMAL LOW (ref 96–112)
GFR calc Af Amer: >90 mL/min (ref >90)
GFR calc non Af Amer: >90 mL/min (ref >90)
Glucose, Bld: 114 mg/dL — ABNORMAL HIGH (ref 70–99)
Potassium: 3.6 mEq/L — ABNORMAL LOW (ref 3.7–5.3)
Sodium: 136 mEq/L — ABNORMAL LOW (ref 137–147)

## 2013-10-10 LAB — STREP PNEUMONIAE URINARY ANTIGEN: Strep Pneumo Urinary Antigen: NEGATIVE

## 2013-10-10 LAB — PRO B NATRIURETIC PEPTIDE: Pro B Natriuretic peptide (BNP): 1584 pg/mL — ABNORMAL HIGH (ref 0–125)

## 2013-10-10 LAB — TROPONIN I
Troponin I: <0.3 ng/mL (ref <0.30)
Troponin I: <0.3 ng/mL (ref <0.30)

## 2013-10-10 LAB — GLUCOSE, CAPILLARY
GLUCOSE-CAPILLARY: 128 mg/dL — AB (ref 70–99)
Glucose-Capillary: 116 mg/dL — ABNORMAL HIGH (ref 70–99)
Glucose-Capillary: 120 mg/dL — ABNORMAL HIGH (ref 70–99)
Glucose-Capillary: 118 mg/dL — ABNORMAL HIGH (ref 70–99)
Glucose-Capillary: 155 mg/dL — ABNORMAL HIGH (ref 70–99)

## 2013-10-10 LAB — VANCOMYCIN, TROUGH: VANCOMYCIN TR: 5.2 ug/mL — AB (ref 10.0–20.0)

## 2013-10-10 MED ORDER — IPRATROPIUM-ALBUTEROL 0.5-2.5 (3) MG/3ML IN SOLN
3.0000 mL | Freq: Four times a day (QID) | RESPIRATORY_TRACT | Status: DC
  Administered 2013-10-10 – 2013-10-11 (×4): 3 mL via RESPIRATORY_TRACT
  Filled 2013-10-10 (×4): qty 3

## 2013-10-10 MED ORDER — ARFORMOTEROL TARTRATE 15 MCG/2ML IN NEBU
15.0000 ug | INHALATION_SOLUTION | Freq: Two times a day (BID) | RESPIRATORY_TRACT | Status: DC
  Administered 2013-10-10 – 2013-10-13 (×6): 15 ug via RESPIRATORY_TRACT
  Filled 2013-10-10 (×9): qty 2

## 2013-10-10 MED ORDER — PANTOPRAZOLE SODIUM 40 MG PO TBEC
40.0000 mg | DELAYED_RELEASE_TABLET | Freq: Two times a day (BID) | ORAL | Status: DC
  Administered 2013-10-10 – 2013-10-11 (×2): 40 mg via ORAL
  Filled 2013-10-10 (×2): qty 1

## 2013-10-10 MED ORDER — POTASSIUM CHLORIDE CRYS ER 20 MEQ PO TBCR
20.0000 meq | EXTENDED_RELEASE_TABLET | Freq: Once | ORAL | Status: AC
  Administered 2013-10-10: 20 meq via ORAL
  Filled 2013-10-10: qty 1

## 2013-10-10 MED ORDER — FUROSEMIDE 10 MG/ML IJ SOLN
40.0000 mg | Freq: Once | INTRAMUSCULAR | Status: AC
  Administered 2013-10-10: 40 mg via INTRAVENOUS
  Filled 2013-10-10: qty 4

## 2013-10-10 MED ORDER — ALPRAZOLAM 0.5 MG PO TABS
0.5000 mg | ORAL_TABLET | Freq: Three times a day (TID) | ORAL | Status: DC | PRN
  Administered 2013-10-11 – 2013-10-13 (×7): 0.5 mg via ORAL
  Filled 2013-10-10 (×8): qty 1

## 2013-10-10 MED ORDER — VANCOMYCIN HCL 500 MG IV SOLR
500.0000 mg | Freq: Three times a day (TID) | INTRAVENOUS | Status: DC
  Administered 2013-10-10 – 2013-10-11 (×2): 500 mg via INTRAVENOUS
  Filled 2013-10-10 (×4): qty 500

## 2013-10-10 MED ORDER — ALPRAZOLAM 0.5 MG PO TABS: 1.0000 mg | ORAL_TABLET | Freq: Three times a day (TID) | ORAL | Status: DC | PRN

## 2013-10-10 NOTE — Progress Notes (Signed)
Echo Lab  2D Echocardiogram completed.  Fiza Nation L Eda Magnussen, RDCS 10/10/2013 2:26 PM

## 2013-10-10 NOTE — Consult Note (Signed)
Name: Wendy [name] MRN: [id number] DOB: 1961/01/10    ADMISSION DATE:  10/05/2013 CONSULTATION DATE:  10/09/2013    REFERRING MD :  Wendy [name] PRIMARY SERVICE:  [facility]  CHIEF COMPLAINT:  Acute Respiratory Failure  BRIEF PATIENT DESCRIPTION: 53 year old female who is a former smoker, and followed by Wendy [name]. She is on 2.5 L/min O2 at home. Presented 1/19 via EMS on CPAP in acute respiratory failure. She was admitted for AECOPD. She has had BiPAP PRN since that time with minimal improvement. PCCM has been asked to see.   SIGNIFICANT EVENTS / STUDIES:  02/13/13 - PFT > FVC 1.23/39%, FEV1 0.48/19%, FEV1/FVC 0.39/49%, FEF 25-75% 0.18/7%, DLCO 46%. 1/19 - Admitted to [facility] 1/22 - CTA Chest > 1. No evidence of pulmonary embolus. 2. Focal right upper lobe pneumonia noted. 3. Few scattered small pulmonary nodules seen, measuring up to 5 mm in size. If the patient is at high risk for bronchogenic carcinoma,  follow-up chest CT at 6-12 months is recommended. If the patient is at low risk for bronchogenic carcinoma, follow-up chest CT at 12 months is recommended. 1/23 - PCCM asked to consult  LINES / TUBES: PIV  CULTURES: 1/20 - Sputum Culture > few Candida Albicans 1/20 - MRSA PCR > Pos  ANTIBIOTICS: Levaquin 1/21 >>> Vanco 1/22 >>> Zosyn 1/22>23 Tamiflu 1/23 >>>  HISTORY OF PRESENT ILLNESS:  53 year old female who sees Wendy [name] as an outpatient. She is a former smoker who is on chronic prednisone and 2.5 L/min of O2 at home via [place]. Most recent PFTs are from 01/2013 and are listed above. She first contacted the office 1/13 complaining of increased DOE, nonproductive cough, and Fever. She was given a zpack and prednisone taper. She contacted the office again 1/19 stating little improvement and a now productive cough with green sputum. She was started on another prednisone taper. That evening EMS was called and arrived to find the patient with inaudible breath sounds, in severe respiratory distress  / failure. She was placed on CPAP and given Solumedrol and improved enough to avoid intubation. She was admitted to the step down unit. 1/21 she decompensated again, had some improvement with BiPAP, and was treated for HAP. 1/23 She has not had much improvement in her respiratory status and is still requiring BiPAP. PCCM has been asked to see.    SUBJECTIVE:  Improved w/ [place] at 3L/m w/ adequate sats Improved anxiety  Sitting up in bed    VITAL SIGNS: Temp:  [97.7 F (36.5 C)-98.5 F (36.9 C)] 98.2 F (36.8 C) (01/24 0820) Pulse Rate:  [86-116] 98 (01/24 0800) Resp:  [11-31] 15 (01/24 0800) BP: (119-185)/(69-119) 150/90 mmHg (01/24 0800) SpO2:  [92 %-99 %] 97 % (01/24 0830)  PHYSICAL EXAMINATION: General:  53 year old female  Neuro:  Awake, oriented., mild anxiety  HEENT:  [place], PERRL Neck:  Supple, no JVD observed.  Cardiovascular:  RRR, no murmur, click, gallop Lungs:  Scattered rhonchi, faint exp wheezing, talking in full sentences  Abdomen:  Soft, non-tender, non-distended.  Musculoskeletal:  intact Skin:  intact   PULMONARY  Recent Labs Lab 10/05/13 2100 10/05/13 2315 10/07/13 2146 10/08/13 0212 10/09/13 1655  PHART 7.299* 7.324* 7.271* 7.302* 7.362  PCO2ART 71.6* 64.0* 76.3* 75.3* 66.3*  PO2ART 175.0* 78.0* 389.0* 224.0* 82.9  HCO3 35.2* 33.3* 34.1* 36.3* 36.7*  TCO2 37 35 36.5 38.6 38.8  O2SAT 99.0 94.0 99.8 99.7 96.6    CBC  Recent Labs Lab 10/06/13  0330 10/07/13 0530 10/09/13 1840  HGB 12.4 12.5 11.7*  HCT 37.1 37.5 35.1*  WBC 12.3* 11.2* 5.0  PLT 135* 141* 171    COAGULATION No results found for this basename: INR,  in the last 168 hours  CARDIAC    Recent Labs Lab 10/09/13 1840 10/10/13 0035 10/10/13 0805  TROPONINI <0.30 <0.30 <0.30    Recent Labs Lab 10/09/13 1840 10/10/13 0805  PROBNP 1726.0* 1584.0*     CHEMISTRY  Recent Labs Lab 10/05/13 2022 10/06/13 0330 10/07/13 0330 10/08/13 0255 10/09/13 1840  NA 134* 129*  136* 138 131*  K 4.0 3.6* 4.8 4.0 3.3*  CL 90* 86* 95* 95* 88*  CO2 32 28 27 34* 33*  GLUCOSE 174* 154* 107* 125* 107*  BUN 3* 5* 5* 8 6  CREATININE 0.46* 0.49* 0.35* 0.42* 0.35*  CALCIUM 9.2 8.9 8.8 8.8 8.1*  MG  --   --   --   --  2.4  PHOS  --   --   --   --  1.8*   Estimated Creatinine Clearance: 60.7 ml/min (by C-G formula based on Cr of 0.35).   LIVER No results found for this basename: AST, ALT, ALKPHOS, BILITOT, PROT, ALBUMIN, INR,  in the last 168 hours   INFECTIOUS  Recent Labs Lab 10/09/13 1840  LATICACIDVEN 0.6     ENDOCRINE CBG (last 3)   Recent Labs  10/09/13 2342 10/10/13 0401 10/10/13 0718  GLUCAP 129* 128* 116*   CXR      No results found.    ASSESSMENT / PLAN:  PULMONARY A: Acute on Chronic Hypercapnic Respiratory Failure: in setting of AECOP. ?HCAP ?Flu ? Volume overload with elevated BNP  RUL PNA  CT chest 1/22 > Neg PE , few scattered nodules, RUL infiltrate  P:   -continue in ICU  -Respiratory viral panel and flu panel pending  -BIPAP prn  -Solumedrol  -aggressive BDs-add brovana  -Cough suppression -f/u CT in 6 months.-set up at discharge  -morphine prn if needed (check ABG first)   CARDIOVASCULAR A: Hypertention Cardiac enzymes neg  bnp elevated   P:  PRN Hydralazine  Cardiac monitoring Rule out CHF and MI via biomarkers IV lasix 40  x 1  Echo pending  Check bnp in am   RENAL A:  Hypokalemia  P:   Supportive Care Replace K+  Check bmet today and in am   GASTROINTESTINAL A:  GI pprphylaxis P:   PPI-change to oral  Add reg diet   HEMATOLOGIC A:  VTE prophylaxis P:  SQ heparin  INFECTIOUS A:  RUL CAP/ possible flu P:   Vanc Levaquin Tamiflu  ENDOCRINE A:  No Acute Issue - but NPO on steroids.   P:   Monitor Glucose while NPO SSI if glucose consistently > 180  NEUROLOGIC A:  Anxiety - On home xanax 1mg  Three times a day  - decrease dose 1/2 As needed  -dont d/c abruptly if possible  P:    D/c benzos May need precedex if anxiety worsens  Can use judicious morphine for dyspnea  TODAY'S SUMMARY:  Severe COPD w/ refractory exacerbation complicated by PNA +/- Influenza. Viral panel pending . ?volume overload w/ elevated BNP. Echo pending.  IV lasix today x 1   30 minutes CC time  Tammy Parrett NP-C  [place] [facility]  [phone]   05-18-1999, MD, PhD 10/10/2013, 12:07 PM [place] [facility] [phone] or if no answer [phone]

## 2013-10-10 NOTE — Progress Notes (Signed)
ANTIBIOTIC CONSULT NOTE - INITIAL  Pharmacy Consult for Vancomycin/Zosyn/Levaquin  Indication: pneumonia (RUL per CT Angio)  Allergies  Allergen Reactions  . Augmentin [Amoxicillin-Pot Clavulanate] Nausea Only  . Codeine Nausea Only  . Fexofenadine Nausea Only    allegra  . Other     PAIN MEDICATIONS-nausea   Patient Measurements: Height: 5\' 1"  (154.9 cm) Weight: 107 lb 5.8 oz (48.7 kg) IBW/kg (Calculated) : 47.8  Vital Signs: Temp: 98.9 F (37.2 C) (01/24 1947) Temp src: Oral (01/24 1947) BP: 158/86 mmHg (01/24 2000) Pulse Rate: 103 (01/24 2000)  Labs:  Recent Labs  10/08/13 0255 10/09/13 1840 10/10/13 0805  WBC  --  5.0  --   HGB  --  11.7*  --   PLT  --  171  --   CREATININE 0.42* 0.35* 0.39*   Estimated Creatinine Clearance: 60.7 ml/min (by C-G formula based on Cr of 0.39).  Microbiology: Recent Results (from the past 720 hour(s))  MRSA PCR SCREENING     Status: Abnormal   Collection Time    10/06/13  2:24 AM      Result Value Range Status   MRSA by PCR POSITIVE (*) NEGATIVE Final   Comment:            The GeneXpert MRSA Assay (FDA     approved for NASAL specimens     only), is one component of a     comprehensive MRSA colonization     surveillance program. It is not     intended to diagnose MRSA     infection nor to guide or     monitor treatment for     MRSA infections.     RESULT CALLED TO, READ BACK BY AND VERIFIED WITH:     IRBY,T RN [id number] 10/06/13 MITCHELL,L  CULTURE, EXPECTORATED SPUTUM-ASSESSMENT     Status: None   Collection Time    10/06/13  4:33 PM      Result Value Range Status   Specimen Description SPUTUM   Final   Special Requests Immunocompromised   Final   Sputum evaluation     Final   Value: THIS SPECIMEN IS ACCEPTABLE. RESPIRATORY CULTURE REPORT TO FOLLOW.   Report Status 10/06/2013 FINAL   Final  CULTURE, RESPIRATORY (NON-EXPECTORATED)     Status: None   Collection Time    10/06/13  4:33 PM      Result Value Range Status   Specimen Description SPUTUM   Final   Special Requests NONE   Final   Gram Stain     Final   Value: MODERATE WBC PRESENT, PREDOMINANTLY PMN     NO SQUAMOUS EPITHELIAL CELLS SEEN     NO ORGANISMS SEEN     Performed at 10/08/13   Culture     Final   Value: FEW CANDIDA ALBICANS     Performed at [facility]   Report Status 10/09/2013 FINAL   Final    Medical History: Past Medical History  Diagnosis Date  . Bronchitis   . Asthma   . Seasonal allergies   . Poor dentition   . Alcoholism   . COPD (chronic obstructive pulmonary disease)   . Depression   . Oxygen dependent     home oxygen 2L/min   Assessment: 53 y/o F admitted 1/21.  She was restarted on broad spectrum antibiotics, RUL PNA per CT Angio.  Vancomycin trough level 5 - low will increase dose  Goal of Therapy:  Vancomycin trough level  15-20 mcg/ml  Plan:  -Vancomycin 500 mg IV q8h  Leota Sauers Pharm.D. CPP, BCPS Clinical Pharmacist [phone] 10/10/2013 8:43 PM

## 2013-10-11 ENCOUNTER — Inpatient Hospital Stay (HOSPITAL_COMMUNITY): Payer: Medicare Other

## 2013-10-11 ENCOUNTER — Encounter (HOSPITAL_COMMUNITY): Payer: Self-pay | Admitting: *Deleted

## 2013-10-11 LAB — RESPIRATORY VIRUS PANEL
Adenovirus: NOT DETECTED
INFLUENZA B 1: NOT DETECTED
Influenza A H1: NOT DETECTED
Influenza A H3: NOT DETECTED
Influenza A: NOT DETECTED
METAPNEUMOVIRUS: NOT DETECTED
PARAINFLUENZA 1 A: NOT DETECTED
PARAINFLUENZA 3 A: NOT DETECTED
Parainfluenza 2: NOT DETECTED
Respiratory Syncytial Virus A: NOT DETECTED
Respiratory Syncytial Virus B: NOT DETECTED
Rhinovirus: NOT DETECTED

## 2013-10-11 LAB — BASIC METABOLIC PANEL
BUN: 9 mg/dL (ref 6–23)
CALCIUM: 8.7 mg/dL (ref 8.4–10.5)
CO2: 39 meq/L — AB (ref 19–32)
CREATININE: 0.39 mg/dL — AB (ref 0.50–1.10)
Chloride: 84 mEq/L — ABNORMAL LOW (ref 96–112)
GFR calc Af Amer: >90 mL/min (ref >90)
GFR calc non Af Amer: >90 mL/min (ref >90)
Glucose, Bld: 146 mg/dL — ABNORMAL HIGH (ref 70–99)
Potassium: 3.3 mEq/L — ABNORMAL LOW (ref 3.7–5.3)
Sodium: 131 mEq/L — ABNORMAL LOW (ref 137–147)

## 2013-10-11 LAB — CBC
HEMATOCRIT: 41.3 % (ref 36.0–46.0)
Hemoglobin: 14 g/dL (ref 12.0–15.0)
MCH: 33.7 pg (ref 26.0–34.0)
MCHC: 33.9 g/dL (ref 30.0–36.0)
MCV: 99.3 fL (ref 78.0–100.0)
Platelets: 220 10*3/uL (ref 150–400)
RBC: 4.16 MIL/uL (ref 3.87–5.11)
RDW: 12.1 % (ref 11.5–15.5)
WBC: 5.9 10*3/uL (ref 4.0–10.5)

## 2013-10-11 LAB — GLUCOSE, CAPILLARY
GLUCOSE-CAPILLARY: 104 mg/dL — AB (ref 70–99)
GLUCOSE-CAPILLARY: 141 mg/dL — AB (ref 70–99)
GLUCOSE-CAPILLARY: 150 mg/dL — AB (ref 70–99)
GLUCOSE-CAPILLARY: 165 mg/dL — AB (ref 70–99)
Glucose-Capillary: 146 mg/dL — ABNORMAL HIGH (ref 70–99)
Glucose-Capillary: 154 mg/dL — ABNORMAL HIGH (ref 70–99)

## 2013-10-11 LAB — LEGIONELLA ANTIGEN, URINE: LEGIONELLA ANTIGEN, URINE: NEGATIVE

## 2013-10-11 LAB — PRO B NATRIURETIC PEPTIDE: PRO B NATRI PEPTIDE: 1117 pg/mL — AB (ref 0–125)

## 2013-10-11 MED ORDER — ALBUTEROL SULFATE (2.5 MG/3ML) 0.083% IN NEBU: 2.5000 mg | INHALATION_SOLUTION | RESPIRATORY_TRACT | Status: DC | PRN

## 2013-10-11 MED ORDER — PREDNISONE 20 MG PO TABS
40.0000 mg | ORAL_TABLET | Freq: Every day | ORAL | Status: DC
  Administered 2013-10-12: 40 mg via ORAL
  Filled 2013-10-11 (×2): qty 2

## 2013-10-11 MED ORDER — MORPHINE SULFATE 2 MG/ML IJ SOLN: 1.0000 mg | Freq: Three times a day (TID) | INTRAMUSCULAR | Status: DC | PRN

## 2013-10-11 MED ORDER — PANTOPRAZOLE SODIUM 40 MG PO TBEC
40.0000 mg | DELAYED_RELEASE_TABLET | Freq: Every day | ORAL | Status: DC
  Administered 2013-10-12 – 2013-10-13 (×2): 40 mg via ORAL
  Filled 2013-10-11 (×2): qty 1

## 2013-10-11 MED ORDER — MORPHINE (PF) INJECTION FOR INHALATION 10 MG/ML
5.0000 mg | RESPIRATORY_TRACT | Status: DC | PRN
  Administered 2013-10-12: 15:00:00 via RESPIRATORY_TRACT
  Filled 2013-10-11: qty 1

## 2013-10-11 MED ORDER — FUROSEMIDE 20 MG PO TABS
20.0000 mg | ORAL_TABLET | Freq: Every day | ORAL | Status: DC
  Administered 2013-10-11 – 2013-10-13 (×3): 20 mg via ORAL
  Filled 2013-10-11 (×3): qty 1

## 2013-10-11 MED ORDER — TIOTROPIUM BROMIDE MONOHYDRATE 18 MCG IN CAPS
18.0000 ug | ORAL_CAPSULE | Freq: Every day | RESPIRATORY_TRACT | Status: DC
  Administered 2013-10-11 – 2013-10-13 (×3): 18 ug via RESPIRATORY_TRACT
  Filled 2013-10-11: qty 5

## 2013-10-11 MED ORDER — LEVOFLOXACIN 750 MG PO TABS
750.0000 mg | ORAL_TABLET | Freq: Every day | ORAL | Status: AC
  Administered 2013-10-12: 750 mg via ORAL
  Filled 2013-10-11: qty 1

## 2013-10-11 MED ORDER — LEVALBUTEROL HCL 1.25 MG/0.5ML IN NEBU
1.2500 mg | INHALATION_SOLUTION | RESPIRATORY_TRACT | Status: DC | PRN
  Filled 2013-10-11: qty 0.5

## 2013-10-11 MED ORDER — MORPHINE SULFATE 2 MG/ML IJ SOLN
2.0000 mg | Freq: Once | INTRAMUSCULAR | Status: AC
  Administered 2013-10-11: 2 mg via INTRAVENOUS

## 2013-10-11 MED ORDER — LEVALBUTEROL HCL 0.63 MG/3ML IN NEBU
0.6300 mg | INHALATION_SOLUTION | RESPIRATORY_TRACT | Status: DC | PRN
  Administered 2013-10-12: 0.63 mg via RESPIRATORY_TRACT
  Filled 2013-10-11: qty 3

## 2013-10-11 MED ORDER — MORPHINE (PF) INJECTION FOR INHALATION 10 MG/ML: 5.0000 mg | RESPIRATORY_TRACT | Status: DC | PRN

## 2013-10-11 MED ORDER — METHYLPREDNISOLONE SODIUM SUCC 40 MG IJ SOLR
40.0000 mg | Freq: Three times a day (TID) | INTRAMUSCULAR | Status: AC
Start: 2013-10-11 — End: 2013-10-11
  Administered 2013-10-11: 40 mg via INTRAVENOUS
  Filled 2013-10-11 (×2): qty 1

## 2013-10-11 MED ORDER — MORPHINE SULFATE 2 MG/ML IJ SOLN
INTRAMUSCULAR | Status: AC
  Administered 2013-10-11: 2 mg via INTRAVENOUS
  Filled 2013-10-11: qty 1

## 2013-10-11 MED ORDER — POTASSIUM CHLORIDE CRYS ER 20 MEQ PO TBCR
20.0000 meq | EXTENDED_RELEASE_TABLET | ORAL | Status: AC
  Administered 2013-10-11 (×2): 20 meq via ORAL
  Filled 2013-10-11 (×2): qty 1

## 2013-10-11 NOTE — Progress Notes (Signed)
eLink Nursing ICU Electrolyte Replacement Protocol  Patient Name: Wendy [name] DOB: 07/22/1961 MRN: [id number]  Date of Service  10/11/2013   HPI/Events of Note    Recent Labs Lab 10/07/13 0330 10/08/13 0255 10/09/13 1840 10/10/13 0805 10/11/13 0249  NA 136* 138 131* 136* 131*  K 4.8 4.0 3.3* 3.6* 3.3*  CL 95* 95* 88* 90* 84*  CO2 27 34* 33* 35* 39*  GLUCOSE 107* 125* 107* 114* 146*  BUN 5* 8 6 8 9  CREATININE 0.35* 0.42* 0.35* 0.39* 0.39*  CALCIUM 8.8 8.8 8.1* 8.4 8.7  MG  --   --  2.4  --   --   PHOS  --   --  1.8*  --   --     Estimated Creatinine Clearance: 60.7 ml/min (by C-G formula based on Cr of 0.39).  Intake/Output     01/24 0701 - 01/25 0700   P.O. 1639   I.V. (mL/kg) 443 (9.1)   IV Piggyback 350   Total Intake(mL/kg) 2432 (49.9)   Urine (mL/kg/hr) 2525 (2.2)   Total Output 2525   Net -93        - I/O DETAILED x24h    Total I/O In: 643 [P.O.:240; I.V.:203; IV Piggyback:200] Out: 550 [Urine:550] - I/O THIS SHIFT    ASSESSMENT   eICURN Interventions  K+ 3.3 Electrolyte protocol criteria met. Value replaced per protocol. MD notified.   ASSESSMENT: MAJOR ELECTROLYTE    ,  Nicole 10/11/2013, 6:16 AM     

## 2013-10-11 NOTE — Progress Notes (Signed)
Name: KEARA PAGLIARULO MRN: [id number] DOB: [birth year]    ADMISSION DATE:  10/05/2013 CONSULTATION DATE:  10/09/2013    REFERRING MD :  Sharon Seller PRIMARY SERVICE:  [facility]  CHIEF COMPLAINT:  Acute Respiratory Failure  BRIEF PATIENT DESCRIPTION: 53 year old female who is a former smoker, and followed by Dr. Maple Hudson. She is on 2.5 L/min O2 at home. Presented 1/19 via EMS on CPAP in acute respiratory failure. She was admitted for AECOPD. She has had BiPAP PRN since that time with minimal improvement. PCCM has been asked to see.   SIGNIFICANT EVENTS / STUDIES:  02/13/13 - PFT > FVC 1.23/39%, FEV1 0.48/19%, FEV1/FVC 0.39/49%, FEF 25-75% 0.18/7%, DLCO 46%. 1/19 - Admitted to [facility] 1/22 - CTA Chest > 1. No evidence of pulmonary embolus. 2. Focal right upper lobe pneumonia noted. 3. Few scattered small pulmonary nodules seen, measuring up to 5 mm in size. If the patient is at high risk for bronchogenic carcinoma,  follow-up chest CT at 6-12 months is recommended. If the patient is at low risk for bronchogenic carcinoma, follow-up chest CT at 12 months is recommended. 1/23 - PCCM asked to consult 1/24 Echo EF 55-60%, grade 1 diastolic dysfunction   LINES / TUBES: PIV  CULTURES: 1/20 - Sputum Culture > few Candida Albicans 1/20 - MRSA PCR > Pos  ANTIBIOTICS: Levaquin 1/21 >>> Vanco 1/22 >>> Zosyn 1/22>23 Tamiflu 1/23 >>>1/25  HISTORY OF PRESENT ILLNESS:  53 year old female who sees Dr. Maple Hudson as an outpatient. She is a former smoker who is on chronic prednisone and 2.5 L/min of O2 at home via [place]. Most recent PFTs are from 01/2013 and are listed above. She first contacted the office 1/13 complaining of increased DOE, nonproductive cough, and Fever. She was given a zpack and prednisone taper. She contacted the office again 1/19 stating little improvement and a now productive cough with green sputum. She was started on another prednisone taper. That evening EMS was called and arrived to find the patient  with inaudible breath sounds, in severe respiratory distress / failure. She was placed on CPAP and given Solumedrol and improved enough to avoid intubation. She was admitted to the step down unit. 1/21 she decompensated again, had some improvement with BiPAP, and was treated for HAP. 1/23 She has not had much improvement in her respiratory status and is still requiring BiPAP. PCCM has been asked to see.    SUBJECTIVE:  Improved w/ [place] at 3L/m w/ adequate sats, activity does desats briefly  Improved anxiety  Sitting up in bed  Complains of shakiness with duoneb , on spiriva at home    VITAL SIGNS: Temp:  [98 F (36.7 C)-99 F (37.2 C)] 98.9 F (37.2 C) (01/25 0425) Pulse Rate:  [91-124] 94 (01/25 0600) Resp:  [13-20] 15 (01/25 0600) BP: (135-162)/(71-122) 160/91 mmHg (01/25 0600) SpO2:  [90 %-99 %] 97 % (01/25 0600)  PHYSICAL EXAMINATION: General:  53 year old female  Neuro:  Awake, oriented., mild anxiety  HEENT:  [place], PERRL Neck:  Supple, no JVD observed.  Cardiovascular:  RRR, no murmur, click, gallop Lungs:  Faint  rhonchi, no wheezing , talking in full sentences   Abdomen:  Soft, non-tender, non-distended.  Musculoskeletal:  intact Skin:  Intact, psoriartic changes    PULMONARY  Recent Labs Lab 10/05/13 2100 10/05/13 2315 10/07/13 2146 10/08/13 0212 10/09/13 1655  PHART 7.299* 7.324* 7.271* 7.302* 7.362  PCO2ART 71.6* 64.0* 76.3* 75.3* 66.3*  PO2ART 175.0* 78.0* 389.0* 224.0* 82.9  HCO3 35.2* 33.3* 34.1* 36.3* 36.7*  TCO2 37 35 36.5 38.6 38.8  O2SAT 99.0 94.0 99.8 99.7 96.6    CBC  Recent Labs Lab 10/07/13 0530 10/09/13 1840 10/11/13 0249  HGB 12.5 11.7* 14.0  HCT 37.5 35.1* 41.3  WBC 11.2* 5.0 5.9  PLT 141* 171 220    COAGULATION No results found for this basename: INR,  in the last 168 hours  CARDIAC    Recent Labs Lab 10/09/13 1840 10/10/13 0035 10/10/13 0805  TROPONINI <0.30 <0.30 <0.30    Recent Labs Lab 10/09/13 1840 10/10/13 0805  10/11/13 0149  PROBNP 1726.0* 1584.0* 1117.0*     CHEMISTRY  Recent Labs Lab 10/07/13 0330 10/08/13 0255 10/09/13 1840 10/10/13 0805 10/11/13 0249  NA 136* 138 131* 136* 131*  K 4.8 4.0 3.3* 3.6* 3.3*  CL 95* 95* 88* 90* 84*  CO2 27 34* 33* 35* 39*  GLUCOSE 107* 125* 107* 114* 146*  BUN 5* 8 6 8 9   CREATININE 0.35* 0.42* 0.35* 0.39* 0.39*  CALCIUM 8.8 8.8 8.1* 8.4 8.7  MG  --   --  2.4  --   --   PHOS  --   --  1.8*  --   --    Estimated Creatinine Clearance: 60.7 ml/min (by C-G formula based on Cr of 0.39).   LIVER No results found for this basename: AST, ALT, ALKPHOS, BILITOT, PROT, ALBUMIN, INR,  in the last 168 hours   INFECTIOUS  Recent Labs Lab 10/09/13 1840  LATICACIDVEN 0.6     ENDOCRINE CBG (last 3)   Recent Labs  10/11/13 0011 10/11/13 0351 10/11/13 0709  GLUCAP 165* 146* 141*   CXR      Dg Chest Port 1 View  10/11/2013   CLINICAL DATA:  COPD  EXAM: PORTABLE CHEST - 1 VIEW  COMPARISON:  CT ANGIO CHEST W/CM &/OR WO/CM dated 10/08/2013; DG CHEST 1V PORT dated 10/07/2013  FINDINGS: Cardiomegaly. Hyperaeration. Calcified granuloma at the right base. No pneumothorax. Right upper lobe airspace disease results.  IMPRESSION: Resolved right upper lobe pneumonia.   Electronically Signed   By: 10/09/2013 M.D.   On: 10/11/2013 07:23      ASSESSMENT / PLAN:  PULMONARY A: Acute on Chronic Hypercapnic Respiratory Failure: in setting of AECOP. ?HCAP ? ? Volume overload with elevated BNP  RUL PNA on CT  CT chest 1/22 > Neg PE , few scattered nodules, RUL infiltrate >Viral panel neg   P:   -transition to med surg  -Respiratory viral panel neg  -BIPAP prn  -end Solumedrol 1/25 , oral  pred 1/26  -cont Budesonide/-brovana added 1/24  (on Advair at home )  -Cough suppression -f/u CT in 6 months.-set up at discharge  -morphine prn if needed -Add Spiriva , cont on albuterol neb As needed     CARDIOVASCULAR A: Hypertention Cardiac enzymes neg  bnp  elevated >echo 1/24 w/ nml EF , grade 1 diastolic dysfunction  1/25 >bnp tr down w/ diuresis , scr /bp stable , good UOP  W/ even bal  P:  PRN Hydralazine  Cardiac monitoring Add Lasix 20mg  daily -goal to neg/even i/o bal   RENAL A:  Hypokalemia  P:   Supportive Care Replace K+  Check bmet  in am   GASTROINTESTINAL A:  GI pprphylaxis P:   PPI-     HEMATOLOGIC A:  VTE prophylaxis P:  SQ heparin  INFECTIOUS A:  RUL CAP/ neg viral panel  >cx neg, narrow  abx  P:    Levaquin Tamiflu d/c 1/25  D/c vanc 1/25   ENDOCRINE A:  Mild Hyperglycemia on steroids  P:   Monitor Glucose  SSI if glucose consistently > 180  NEUROLOGIC A:  Anxiety - On home xanax 1mg  Three times a day  - decrease dose 1/2 As needed  -dont d/c abruptly if possible  P:    cont xanax As needed   D/c mso4 as going to floor > will change to MSO4 nebs    TODAY'S SUMMARY:  Severe COPD w/ slow to resolve exacerbation complicated by PNA   Mild diastolic dysfunction on echo . Add low dose lasix to avoid fluid overload. Taper steroids as contributing to anxiety , and fluid retention .  Transfer to Tele bed.     Tammy Parrett NP-C  [facility]  [phone]   Levy Pupa, MD, PhD 10/11/2013, 1:01 PM [facility] [phone] or if no answer [phone]

## 2013-10-11 NOTE — Progress Notes (Signed)
eLink Physician-Brief Progress Note Patient Name: Wendy [name] DOB: 17-May-1961 MRN: [id number]  Date of Service  10/11/2013   HPI/Events of Note   Morphine needed for relief of dyspnea, still waiting on nebulized morphine from pharmacy  eICU Interventions  One time morphine order placed to assist with getting up to bedpan   Intervention Category Intermediate Interventions: Respiratory distress - evaluation and management  MCQUAID, DOUGLAS 10/11/2013, 6:01 PM

## 2013-10-12 DIAGNOSIS — F1010 Alcohol abuse, uncomplicated: Secondary | ICD-10-CM

## 2013-10-12 DIAGNOSIS — F10239 Alcohol dependence with withdrawal, unspecified: Secondary | ICD-10-CM

## 2013-10-12 DIAGNOSIS — F10939 Alcohol use, unspecified with withdrawal, unspecified: Secondary | ICD-10-CM

## 2013-10-12 LAB — CBC
HEMATOCRIT: 37.9 % (ref 36.0–46.0)
HEMOGLOBIN: 12.8 g/dL (ref 12.0–15.0)
MCH: 33.7 pg (ref 26.0–34.0)
MCHC: 33.8 g/dL (ref 30.0–36.0)
MCV: 99.7 fL (ref 78.0–100.0)
Platelets: 212 10*3/uL (ref 150–400)
RBC: 3.8 MIL/uL — ABNORMAL LOW (ref 3.87–5.11)
RDW: 12 % (ref 11.5–15.5)
WBC: 7.2 10*3/uL (ref 4.0–10.5)

## 2013-10-12 LAB — GLUCOSE, CAPILLARY
Glucose-Capillary: 165 mg/dL — ABNORMAL HIGH (ref 70–99)
Glucose-Capillary: 77 mg/dL (ref 70–99)
Glucose-Capillary: 91 mg/dL (ref 70–99)
Glucose-Capillary: 95 mg/dL (ref 70–99)
Glucose-Capillary: 99 mg/dL (ref 70–99)
Glucose-Capillary: 99 mg/dL (ref 70–99)

## 2013-10-12 LAB — BASIC METABOLIC PANEL
BUN: 9 mg/dL (ref 6–23)
CHLORIDE: 86 meq/L — AB (ref 96–112)
CO2: 43 mEq/L (ref 19–32)
Calcium: 8.5 mg/dL (ref 8.4–10.5)
Creatinine, Ser: 0.46 mg/dL — ABNORMAL LOW (ref 0.50–1.10)
Glucose, Bld: 93 mg/dL (ref 70–99)
POTASSIUM: 3.3 meq/L — AB (ref 3.7–5.3)
Sodium: 135 mEq/L — ABNORMAL LOW (ref 137–147)

## 2013-10-12 LAB — PHOSPHORUS: Phosphorus: 2.1 mg/dL — ABNORMAL LOW (ref 2.3–4.6)

## 2013-10-12 LAB — MAGNESIUM: MAGNESIUM: 1.8 mg/dL (ref 1.5–2.5)

## 2013-10-12 MED ORDER — K PHOS MONO-SOD PHOS DI & MONO 155-852-130 MG PO TABS
500.0000 mg | ORAL_TABLET | Freq: Two times a day (BID) | ORAL | Status: AC
  Administered 2013-10-12 (×2): 500 mg via ORAL
  Filled 2013-10-12 (×2): qty 2

## 2013-10-12 MED ORDER — PREDNISONE 20 MG PO TABS
30.0000 mg | ORAL_TABLET | Freq: Every day | ORAL | Status: DC
  Administered 2013-10-13: 30 mg via ORAL
  Filled 2013-10-12 (×2): qty 1

## 2013-10-12 MED ORDER — POTASSIUM CHLORIDE CRYS ER 20 MEQ PO TBCR
20.0000 meq | EXTENDED_RELEASE_TABLET | ORAL | Status: AC
  Administered 2013-10-12 (×2): 20 meq via ORAL
  Filled 2013-10-12 (×2): qty 1

## 2013-10-12 MED ORDER — MAGNESIUM SULFATE 40 MG/ML IJ SOLN
2.0000 g | Freq: Once | INTRAMUSCULAR | Status: AC
  Administered 2013-10-12: 2 g via INTRAVENOUS
  Filled 2013-10-12: qty 50

## 2013-10-12 NOTE — Progress Notes (Signed)
Name: Wendy [name] MRN: [id number] DOB: 1961/03/23    ADMISSION DATE:  10/05/2013 CONSULTATION DATE:  10/09/2013    REFERRING MD :  Sharon Seller PRIMARY SERVICE:  [facility]  CHIEF COMPLAINT:  Acute Respiratory Failure  BRIEF PATIENT DESCRIPTION: 53 year old female who is a former smoker, and followed by Dr. Maple Hudson. She is on 2.5 L/min O2 at home. Presented 1/19 via EMS on CPAP in acute respiratory failure. She was admitted for AECOPD. She has had BiPAP PRN since that time with minimal improvement. PCCM has been asked to see.   SIGNIFICANT EVENTS / STUDIES:  02/13/13 - PFT > FVC 1.23/39%, FEV1 0.48/19%, FEV1/FVC 0.39/49%, FEF 25-75% 0.18/7%, DLCO 46%. 1/19 - Admitted to [facility] 1/22 - CTA Chest > 1. No evidence of pulmonary embolus. 2. Focal right upper lobe pneumonia noted. 3. Few scattered small pulmonary nodules seen, measuring up to 5 mm in size. If the patient is at high risk for bronchogenic carcinoma,  follow-up chest CT at 6-12 months is recommended. If the patient is at low risk for bronchogenic carcinoma, follow-up chest CT at 12 months is recommended. 1/23 - PCCM asked to consult 1/24 Echo EF 55-60%, grade 1 diastolic dysfunction   LINES / TUBES: PIV  CULTURES: 1/20 - Sputum Culture > few Candida Albicans 1/20 - MRSA PCR > Pos  ANTIBIOTICS: Levaquin 1/21 >>> Vanco 1/22 >>>1/25 Zosyn 1/22>1/23 Tamiflu 1/23 >>>1/25  SUBJECTIVE:  Patient reports she overall feels much better than when she was transferred to ICU.  Has mild productive cough this AM.  Remains anxious about receiving anxiety medications on time.   VITAL SIGNS: Temp:  [98 F (36.7 C)-99.2 F (37.3 C)] 98.4 F (36.9 C) (01/26 0431) Pulse Rate:  [72-122] 91 (01/26 0400) Resp:  [11-26] 13 (01/26 0400) BP: (131-172)/(79-108) 141/82 mmHg (01/26 0400) SpO2:  [92 %-100 %] 93 % (01/26 0400)  PHYSICAL EXAMINATION: General:  53 year old female, O2 via [place], no acute distress Neuro:  Awake, oriented., mild anxiety  HEENT:   [place], PERRL Neck:  Supple Cardiovascular:  Tachycardic, no murmur appreciated Lungs:  Good air movement bilaterally, no wheezing , talking in full sentences   Abdomen:  Soft, non-tender, non-distended.  Musculoskeletal:  intact Skin:  Intact, psoriartic changes    PULMONARY  Recent Labs Lab 10/05/13 2100 10/05/13 2315 10/07/13 2146 10/08/13 0212 10/09/13 1655  PHART 7.299* 7.324* 7.271* 7.302* 7.362  PCO2ART 71.6* 64.0* 76.3* 75.3* 66.3*  PO2ART 175.0* 78.0* 389.0* 224.0* 82.9  HCO3 35.2* 33.3* 34.1* 36.3* 36.7*  TCO2 37 35 36.5 38.6 38.8  O2SAT 99.0 94.0 99.8 99.7 96.6    CBC  Recent Labs Lab 10/09/13 1840 10/11/13 0249 10/12/13 0330  HGB 11.7* 14.0 12.8  HCT 35.1* 41.3 37.9  WBC 5.0 5.9 7.2  PLT 171 220 212    COAGULATION No results found for this basename: INR,  in the last 168 hours  CARDIAC    Recent Labs Lab 10/09/13 1840 10/10/13 0035 10/10/13 0805  TROPONINI <0.30 <0.30 <0.30    Recent Labs Lab 10/09/13 1840 10/10/13 0805 10/11/13 0149  PROBNP 1726.0* 1584.0* 1117.0*     CHEMISTRY  Recent Labs Lab 10/08/13 0255 10/09/13 1840 10/10/13 0805 10/11/13 0249 10/12/13 0330  NA 138 131* 136* 131* 135*  K 4.0 3.3* 3.6* 3.3* 3.3*  CL 95* 88* 90* 84* 86*  CO2 34* 33* 35* 39* 43*  GLUCOSE 125* 107* 114* 146* 93  BUN 8 6 8 9 9   CREATININE 0.42* 0.35* 0.39*  0.39* 0.46*  CALCIUM 8.8 8.1* 8.4 8.7 8.5  MG  --  2.4  --   --  1.8  PHOS  --  1.8*  --   --  2.1*   Estimated Creatinine Clearance: 60.7 ml/min (by C-G formula based on Cr of 0.46).   LIVER No results found for this basename: AST, ALT, ALKPHOS, BILITOT, PROT, ALBUMIN, INR,  in the last 168 hours   INFECTIOUS  Recent Labs Lab 10/09/13 1840  LATICACIDVEN 0.6     ENDOCRINE CBG (last 3)   Recent Labs  10/11/13 1921 10/11/13 2339 10/12/13 0344  GLUCAP 154* 99 91   CXR      Dg Chest Port 1 View  10/11/2013   CLINICAL DATA:  COPD  EXAM: PORTABLE CHEST - 1 VIEW   COMPARISON:  CT ANGIO CHEST W/CM &/OR WO/CM dated 10/08/2013; DG CHEST 1V PORT dated 10/07/2013  FINDINGS: Cardiomegaly. Hyperaeration. Calcified granuloma at the right base. No pneumothorax. Right upper lobe airspace disease results.  IMPRESSION: Resolved right upper lobe pneumonia.   Electronically Signed   By: Maryclare Bean M.D.   On: 10/11/2013 07:23      ASSESSMENT / PLAN:  PULMONARY A: Acute on Chronic Hypercapnic Respiratory Failure: in setting of AECOP. ?HCAP ? ? Volume overload with elevated BNP - stay neutral I/O RUL PNA on CT  CT chest 1/22 > Neg PE , few scattered nodules, RUL infiltrate >Viral panel neg   P:   -transition to med surg  -BIPAP dc off MAR -oral  Prednisone, atper -cont Budesonide/-brovana added 1/24  (on Advair at home )  -Cough suppression -f/u CT in 6 months.-set up at discharge with Dr young to also determine timgin -morphine prn if needed -Spiriva , cont on albuterol neb As needed    CARDIOVASCULAR A: Hypertention Cardiac enzymes neg  bnp elevated >echo 1/24 w/ nml EF , grade 1 diastolic dysfunction  1/25 >bnp tr down w/ diuresis , scr /bp stable , good UOP  W/ even bal  P:  PRN Hydralazine  Cardiac monitoring Lasix 20mg  daily -goal neg  To even  RENAL A:  Hypokalemia , hypophosphatemia, hypomagnesemia P:   Supportive Care K+ x2. Daily BMP. Phos replacement, mag supp Lasix Follow hco2 on bmet  GASTROINTESTINAL A:  GI pprphylaxis P:   PPI dc if not home med and tolerating diet diet tolerated  HEMATOLOGIC A:  VTE prophylaxis P:  SQ heparin Ambulate if able, check sats, then dc sub q he if able  INFECTIOUS A:  RUL CAP/ neg viral panel  >cx neg, narrow abx   P:    Levaquin, does have a small area of concern rul, stop after 7 days = today  ENDOCRINE A:  Mild Hyperglycemia on steroids  P:   Monitor Glucose  SSI if glucose consistently > 180 Reduce steroids  NEUROLOGIC A:  Anxiety - On home xanax 1mg  Three times a day  -  decrease dose 1/2 As needed  -dont d/c abruptly if possible  P:    cont xanax As needed   MSO4 nebs ambulate  TODAY'S SUMMARY:  Severe COPD w/ slow to resolve exacerbation complicated by PNA ?? Improved to floor  , DO PGY-1  10/12/2013, 7:37 AM Pager [phone]  I have fully examined this patient and agree with above findings.    And edited i nfull  10/14/2013. [phone], MD, FACP Pgr: [phone]  Pulmonary & Critical Care

## 2013-10-13 LAB — BASIC METABOLIC PANEL
BUN: 7 mg/dL (ref 6–23)
BUN: 6 mg/dL (ref 6–23)
CALCIUM: 8.2 mg/dL — AB (ref 8.4–10.5)
CHLORIDE: 89 meq/L — AB (ref 96–112)
CO2: 43 meq/L — AB (ref 19–32)
CO2: 38 mEq/L — ABNORMAL HIGH (ref 19–32)
Calcium: 8.5 mg/dL (ref 8.4–10.5)
Chloride: 90 mEq/L — ABNORMAL LOW (ref 96–112)
Creatinine, Ser: 0.46 mg/dL — ABNORMAL LOW (ref 0.50–1.10)
Creatinine, Ser: 0.47 mg/dL — ABNORMAL LOW (ref 0.50–1.10)
GFR calc Af Amer: >90 mL/min (ref >90)
GFR calc Af Amer: >90 mL/min (ref >90)
GFR calc non Af Amer: >90 mL/min (ref >90)
GLUCOSE: 94 mg/dL (ref 70–99)
Glucose, Bld: 149 mg/dL — ABNORMAL HIGH (ref 70–99)
POTASSIUM: 3.8 meq/L (ref 3.7–5.3)
Potassium: 2.9 mEq/L — CL (ref 3.7–5.3)
SODIUM: 139 meq/L (ref 137–147)
SODIUM: 139 meq/L (ref 137–147)

## 2013-10-13 LAB — GLUCOSE, CAPILLARY: GLUCOSE-CAPILLARY: 90 mg/dL (ref 70–99)

## 2013-10-13 LAB — PHOSPHORUS: PHOSPHORUS: 2.7 mg/dL (ref 2.3–4.6)

## 2013-10-13 LAB — MAGNESIUM: MAGNESIUM: 2 mg/dL (ref 1.5–2.5)

## 2013-10-13 MED ORDER — PREDNISONE 10 MG PO TABS: 30.0000 mg | ORAL_TABLET | Freq: Every day | ORAL | Status: DC

## 2013-10-13 MED ORDER — POTASSIUM CHLORIDE CRYS ER 20 MEQ PO TBCR
40.0000 meq | EXTENDED_RELEASE_TABLET | Freq: Two times a day (BID) | ORAL | Status: AC
  Administered 2013-10-13 (×2): 40 meq via ORAL
  Filled 2013-10-13 (×2): qty 2

## 2013-10-13 NOTE — Progress Notes (Signed)
Thank you to Wendy [name] [facility] for this referral.  Chart review complete.  Patient is not eligible for [facility] Care Management services because his/her PCP is not a [facility] primary care provider or is not [facility] affiliated.  She has recently changed to [employer] [facility] [address] [place].  She expressed concern regarding how she was supposed to pay for medications and how to apply for foods stamps.  Referred these concerns to Wendy [name] RNCM.  For any additional questions or new referrals please contact Anibal Henderson BSN RN [facility] Liaison at [phone]

## 2013-10-13 NOTE — Discharge Summary (Signed)
PULMONOLOGY/CRITICAL CARE DISCHARGE SUMMARY  Name: Wendy [name] MRN: [id number] DOB: 11-21-1960 53 y.o. PCP: Mia Creek, MD  Date of Admission: 10/05/2013  8:18 PM Date of Discharge: 10/13/2013 Attending Physician: Kalman Shan, MD  Discharge Diagnosis: Principal Problem:    Acute on chronic respiratory failure Active Problems:   COPD exacerbation   Hyponatremia   Dehydration   Thrombocytopenia, unspecified   Community Acquired Pneumonia   Diastolic Dysfunction.    Discharge Medications:   Medication List         albuterol 108 (90 BASE) MCG/ACT inhaler  Commonly known as:  PROVENTIL HFA;VENTOLIN HFA  Inhale 2-3 puffs into the lungs every 4 (four) hours as needed for wheezing or shortness of breath (**Must last 30 days- per dr young**).     albuterol (2.5 MG/3ML) 0.083% nebulizer solution  Commonly known as:  PROVENTIL  Take 3 mLs (2.5 mg total) by nebulization 4 (four) times daily. DX.  492.8     ALPRAZolam 1 MG tablet  Commonly known as:  XANAX  Take 1 tablet (1 mg total) by mouth 3 (three) times daily as needed for anxiety or sleep.     eucerin lotion  Apply 1 Bottle topically as needed for dry skin (to body).     fluticasone 50 MCG/ACT nasal spray  Commonly known as:  FLONASE  Place 2 sprays into the nose daily.     Fluticasone-Salmeterol 500-50 MCG/DOSE Aepb  Commonly known as:  ADVAIR DISKUS  Inhale 1 puff into the lungs 2 (two) times daily.     ibuprofen 200 MG tablet  Commonly known as:  ADVIL  Take 3 tablets (600 mg total) by mouth every 6 (six) hours as needed for pain (side pain.  Use sparingly as NSAIDs can worsen your breathing.).     predniSONE 10 MG tablet  Commonly known as:  DELTASONE  Take 3 tablets (30 mg total) by mouth daily with breakfast.     tiotropium 18 MCG inhalation capsule  Commonly known as:  SPIRIVA  Place 18 mcg into inhaler and inhale daily.        Disposition and follow-up:   Wendy [name] was discharged  from [facility] in Stable condition.  At the hospital follow up visit please address:  1.  Respiratory status, compliance/med rec. Volume status/ need for lasix.  2.  Labs / imaging needed at time of follow-up: Follow up CT in 6 months  3.  Pending labs/ test needing follow-up: None  Follow-up Appointments: Follow-up Information   Follow up with Waymon Budge, MD On 10/15/2013. (@3pm )    Specialty:  Pulmonary Disease   Contact information:   [address]  [facility] [place] [address] [phone]       Schedule an appointment as soon as possible for a visit with TALBOT, DAVID Salena Saner, MD.   Specialty:  Internal Medicine   Contact information:   [address] [place] [address] [phone]       Discharge Instructions:  Future Appointments Provider Department Dept Phone   10/15/2013 3:00 PM Waymon Budge, MD [facility] [phone]   12/07/2013 11:00 AM Waymon Budge, MD [facility] [phone]     Procedures Performed:  Ct Angio Chest Pe W/cm &/or Wo Cm  10/08/2013   CLINICAL DATA:  Elevated D-dimer.  Hypoxia and tachypnea.  EXAM: CT ANGIOGRAPHY CHEST WITH CONTRAST  TECHNIQUE: Multidetector CT imaging of the chest was performed using  the standard protocol during bolus administration of intravenous contrast. Multiplanar CT image reconstructions including MIPs were obtained to evaluate the vascular anatomy.  CONTRAST:  OMNIPAQUE IOHEXOL 350 MG/ML SOLN  COMPARISON:  DG CHEST 1V PORT dated 10/07/2013; CT ANGIO CHEST W/CM &/OR WO/CM dated 09/18/2010  FINDINGS: There is no evidence of pulmonary embolus.  Focal airspace opacity is seen near the right lung apex, compatible with a mild infectious process. A few scattered small pulmonary nodules are seen, with a 3 mm pulmonary nodule at the right lung apex, 4 mm nodule at the left lung apex (both on image 14 of 98), and a 5 mm nodule along the left major fissure  (image 25 of 98). There is no evidence of pleural effusion or pneumothorax. No masses are identified; no abnormal focal contrast enhancement is seen.  The mediastinum is unremarkable in appearance. No mediastinal lymphadenopathy is seen. No pericardial effusion is identified. The great vessels are grossly unremarkable in appearance. No axillary lymphadenopathy is seen. The visualized portions of the thyroid gland are unremarkable in appearance.  The visualized portions of the liver and spleen are unremarkable.  No acute osseous abnormalities are seen.  Review of the MIP images confirms the above findings.  IMPRESSION: 1. No evidence of pulmonary embolus. 2. Focal right upper lobe pneumonia noted. 3. Few scattered small pulmonary nodules seen, measuring up to 5 mm in size. If the patient is at high risk for bronchogenic carcinoma, follow-up chest CT at 6-12 months is recommended. If the patient is at low risk for bronchogenic carcinoma, follow-up chest CT at 12 months is recommended. This recommendation follows the consensus statement: Guidelines for Management of Small Pulmonary Nodules Detected on CT Scans: A Statement from the Fleischner Society as published in Radiology 2005;237:395-400.   Electronically Signed   By: Roanna Raider M.D.   On: 10/08/2013 05:07   2D Echo: 10/10/13 Study Conclusions Left ventricle: The cavity size was normal. Systolic function was normal. The estimated ejection fraction was in the range of 55% to 60%. Doppler parameters are consistent with abnormal left ventricular relaxation (grade 1 diastolic dysfunction).    Admission HPI: Wendy [name] is a 53 y.o. female h/o very severe COPD / emphyzema. Patient presents with several day history of worsening dyspnea, cough productive of brownish sputum, SOB. Not relieved by a Zpak that she just finished at home. This evening EMS was called and arrived to find the patient with inaudible breath sounds, in severe respiratory distress /  failure. After CPAP, 125 solumedrol given by EMS, BIPAP, and continuous neb. She has narrowly avoided intubation and now has some breath sounds.  SIGNIFICANT EVENTS / STUDIES:  02/13/13 - PFT > FVC 1.23/39%, FEV1 0.48/19%, FEV1/FVC 0.39/49%, FEF 25-75% 0.18/7%, DLCO 46%.  1/19 - Admitted to [facility]  1/22 - CTA Chest > 1. No evidence of pulmonary embolus. 2. Focal right upper lobe pneumonia noted. 3. Few scattered small pulmonary nodules seen, measuring up to 5 mm in size. If the patient is at high risk for bronchogenic carcinoma, follow-up chest CT at 6-12 months is recommended. If the patient is at low risk for bronchogenic carcinoma, follow-up chest CT at 12 months is recommended.  1/23 - PCCM asked to consult  1/24 Echo EF 55-60%, grade 1 diastolic dysfunction   CULTURES:  1/20 - Sputum Culture > few Candida Albicans  1/20 - MRSA PCR > Pos  1/23 Resp Virus Panel >>> neg  ANTIBIOTICS:  Levaquin 1/20 >>>1/26  Vanco 1/22 >>>1/25  Zosyn 1/22>1/23  Tamiflu 1/23 >>>1/25  Hospital Course: Wendy [name] was admitted to [facility] on 1/19 with a suspected COPD exacerbation.  She was placed on CPAP and given solumedrol and avoided intubation.  On 1/21 she decompensated again and only had mild improvement with BIPAP, she was started on empiric antibiotic coverage with Vanc, Zosyn, and Levaquin for potential HCAP.  She failed to show much improvement on intermittent BIPAP and Pulmonology/Critical Care was consulted on 1/23.  She was transferred to the ICU, she did not wish to be intubated at that time was was treated with continuous BIPAP, empiric Tamiflu, and aggressive bronchodilators.  Repeat CXR showed resolution of her RUL PNA.  Her antibiotics were scaled back to Levaquin and she completed a 7 day course of antibiotics.  She was treated with lasix as she appeared mildly volume overloaded and she had an elevated BNP.  A TTE was obtained on 1/25 which showed normal EF with grade 1 diastolic dysfunction.  She was  lightly diuresed, her electrolytes were monitored and replaced as necessary.  On 1/27 she maintained O2 sats >94% while ambulating on 2.5 L O2 via [place], and felt well.  She was discharged with follow up in 2 days with her pulmonologist, a prednisone taper, and to resume home O2 as directed.   Discharge Vitals:   BP 123/77  Pulse 100  Temp(Src) 98.4 F (36.9 C) (Oral)  Resp 25  Ht 5\' 1"  (1.549 m)  Wt 107 lb 5.8 oz (48.7 kg)  BMI 20.30 kg/m2  SpO2 98%  Discharge Labs:  Results for orders placed during the hospital encounter of 10/05/13 (from the past 24 hour(s))  GLUCOSE, CAPILLARY     Status: Abnormal   Collection Time    10/12/13  3:58 PM      Result Value Range   Glucose-Capillary 165 (*) 70 - 99 mg/dL  GLUCOSE, CAPILLARY     Status: None   Collection Time    10/12/13  7:03 PM      Result Value Range   Glucose-Capillary 95  70 - 99 mg/dL  BASIC METABOLIC PANEL     Status: Abnormal   Collection Time    10/13/13  3:00 AM      Result Value Range   Sodium 139  137 - 147 mEq/L   Potassium 2.9 (*) 3.7 - 5.3 mEq/L   Chloride 89 (*) 96 - 112 mEq/L   CO2 43 (*) 19 - 32 mEq/L   Glucose, Bld 94  70 - 99 mg/dL   BUN 7  6 - 23 mg/dL   Creatinine, Ser 1.61 (*) 0.50 - 1.10 mg/dL   Calcium 8.2 (*) 8.4 - 10.5 mg/dL   GFR calc non Af Amer >90  >90 mL/min   GFR calc Af Amer >90  >90 mL/min  MAGNESIUM     Status: None   Collection Time    10/13/13  3:00 AM      Result Value Range   Magnesium 2.0  1.5 - 2.5 mg/dL  PHOSPHORUS     Status: None   Collection Time    10/13/13  3:00 AM      Result Value Range   Phosphorus 2.7  2.3 - 4.6 mg/dL  GLUCOSE, CAPILLARY     Status: None   Collection Time    10/13/13  4:03 AM      Result Value Range   Glucose-Capillary 90  70 - 99 mg/dL   Comment 1 Documented in Chart  Comment 2 Notify RN    BASIC METABOLIC PANEL     Status: Abnormal   Collection Time    10/13/13  1:45 PM      Result Value Range   Sodium 139  137 - 147 mEq/L   Potassium  3.8  3.7 - 5.3 mEq/L   Chloride 90 (*) 96 - 112 mEq/L   CO2 38 (*) 19 - 32 mEq/L   Glucose, Bld 149 (*) 70 - 99 mg/dL   BUN 6  6 - 23 mg/dL   Creatinine, Ser 4.09 (*) 0.50 - 1.10 mg/dL   Calcium 8.5  8.4 - 81.1 mg/dL   GFR calc non Af Amer >90  >90 mL/min   GFR calc Af Amer >90  >90 mL/min      Signed: Carlynn Purl, DO 10/13/2013, 3:20 PM   Time Spent on Discharge: 40 minutes Services Ordered on Discharge: None Equipment Ordered on Discharge: None   See progress note for day Agree with above She walked the ICU and did well, no desat from baseline To follow up pulm office K supp given  Mcarthur Rossetti. Tyson Alias, MD, FACP Pgr: [phone] [facility]

## 2013-10-13 NOTE — Progress Notes (Signed)
1100 Ambulated times one around unit. Oxygen saturation 94 percent and higher. Dr. Tyson Alias made aware. Family at bedside aware. No Shortness of breath or labored breathing noted on ambulation.

## 2013-10-13 NOTE — Discharge Instructions (Signed)
Please resume you home medications as directed.  You will continue to take prednisone for the next 12 days unless Dr. Maple Hudson tells you otherwise.  You will take 30mg  of prednisone for 4 days, then 20mg  for 4 days, then 10mg  for 4 days then you can stop.  Please make it to your appointment with Dr. on Thursday.

## 2013-10-13 NOTE — Care Management Note (Signed)
    Page 1 of 1   10/13/2013     4:32:09 PM   CARE MANAGEMENT NOTE 10/13/2013  Patient:  Wendy [name], Wendy [name]   Account Number:  [id number]  Date Initiated:  10/06/2013  Documentation initiated by:  Donn Pierini  Subjective/Objective Assessment:   Pt admitted with respiratory distress- COPD exacerbation     Action/Plan:   PTA pt lived at home- NCM to follow for d/c needs as pt progresses   Anticipated DC Date:  10/12/2013   Anticipated DC Plan:  HOME/SELF CARE      DC Planning Services  CM consult      Choice offered to / List presented to:             Status of service:  Completed, signed off Medicare Important Message given?   (If response is "NO", the following Medicare IM given date fields will be blank) Date Medicare IM given:   Date Additional Medicare IM given:    Discharge Disposition:  HOME/SELF CARE  Per UR Regulation:  Reviewed for med. necessity/level of care/duration of stay  If discussed at Long Length of Stay Meetings, dates discussed:    Comments:  ContactJuvia, Aerts Daughter [phone]  10-13-13 4:30pm Avie Arenas, RNBSN [phone] Has home oxygen with [facility] - lives with SO and son.  Able to pick up and afford meds.  Would like Food stamps - has already filled out application.  Informed her to follow up with Medicaid and disability SW.  Family to bring in home oxygen and will verify she has oxygen in the tank. Plan for discharge home. [facility] referral  but physician is not [place]he [place], has physician that in not in [facility] network.  10/09/13- 1445- Donn Pierini RN, BSN [phone] The pt cotinues to have difficulty breathing.  She is also quite anxious, and this is causing difficulty w/ her complying w/ BIPAP/CPAP-- PT eval pending

## 2013-10-13 NOTE — Progress Notes (Signed)
PULMONARY/CRITICAL CARE  Name: Wendy [name] MRN: [id number] DOB: 1961-04-07    ADMISSION DATE:  10/05/2013 CONSULTATION DATE:  10/09/2013    REFERRING MD :  Sharon Seller PRIMARY SERVICE:  [facility]  CHIEF COMPLAINT:  Acute Respiratory Failure  BRIEF PATIENT DESCRIPTION: 53 year old female who is a former smoker, and followed by Dr. Maple Hudson. She is on 2.5 L/min O2 at home. Presented 1/19 via EMS on CPAP in acute respiratory failure. She was admitted for AECOPD. She has had BiPAP PRN since that time with minimal improvement. PCCM has been asked to see.   SIGNIFICANT EVENTS / STUDIES:  02/13/13 - PFT > FVC 1.23/39%, FEV1 0.48/19%, FEV1/FVC 0.39/49%, FEF 25-75% 0.18/7%, DLCO 46%. 1/19 - Admitted to [facility] 1/22 - CTA Chest > 1. No evidence of pulmonary embolus. 2. Focal right upper lobe pneumonia noted. 3. Few scattered small pulmonary nodules seen, measuring up to 5 mm in size. If the patient is at high risk for bronchogenic carcinoma,  follow-up chest CT at 6-12 months is recommended. If the patient is at low risk for bronchogenic carcinoma, follow-up chest CT at 12 months is recommended. 1/23 - PCCM asked to consult 1/24 Echo EF 55-60%, grade 1 diastolic dysfunction   LINES / TUBES: PIV  CULTURES: 1/20 - Sputum Culture > few Candida Albicans 1/20 - MRSA PCR > Pos 1/23 Resp Virus Panel >>> neg  ANTIBIOTICS: Levaquin 1/20 >>>1/26 Vanco 1/22 >>>1/25 Zosyn 1/22>1/23 Tamiflu 1/23 >>>1/25  SUBJECTIVE:  No acute events overnight.  Reports she is feeling much better, cough no longer productive.  She reports she is ready to leave the ICU.   VITAL SIGNS: Temp:  [98 F (36.7 C)-99.1 F (37.3 C)] 98.7 F (37.1 C) (01/27 0404) Pulse Rate:  [76-113] 78 (01/27 0400) Resp:  [11-23] 19 (01/27 0000) BP: (94-157)/(67-83) 118/67 mmHg (01/27 0400) SpO2:  [90 %-100 %] 97 % (01/27 0400)  PHYSICAL EXAMINATION: General:  O2 via [place], no acute distress Neuro:  Awake, oriented., anxiety improved HEENT:  [place],  PERRLA Cardiovascular:  RRR, no murmur appreciated Lungs:  Good air movement bilaterally, no wheezing , talking in full sentences   Abdomen:  Soft, non-tender, non-distended.  Musculoskeletal:  intact Skin:  Intact, psoriartic changes    PULMONARY  Recent Labs Lab 10/07/13 2146 10/08/13 0212 10/09/13 1655  PHART 7.271* 7.302* 7.362  PCO2ART 76.3* 75.3* 66.3*  PO2ART 389.0* 224.0* 82.9  HCO3 34.1* 36.3* 36.7*  TCO2 36.5 38.6 38.8  O2SAT 99.8 99.7 96.6    CBC  Recent Labs Lab 10/09/13 1840 10/11/13 0249 10/12/13 0330  HGB 11.7* 14.0 12.8  HCT 35.1* 41.3 37.9  WBC 5.0 5.9 7.2  PLT 171 220 212    COAGULATION No results found for this basename: INR,  in the last 168 hours  CARDIAC    Recent Labs Lab 10/09/13 1840 10/10/13 0035 10/10/13 0805  TROPONINI <0.30 <0.30 <0.30    Recent Labs Lab 10/09/13 1840 10/10/13 0805 10/11/13 0149  PROBNP 1726.0* 1584.0* 1117.0*     CHEMISTRY  Recent Labs Lab 10/09/13 1840 10/10/13 0805 10/11/13 0249 10/12/13 0330 10/13/13 0300  NA 131* 136* 131* 135* 139  K 3.3* 3.6* 3.3* 3.3* 2.9*  CL 88* 90* 84* 86* 89*  CO2 33* 35* 39* 43* 43*  GLUCOSE 107* 114* 146* 93 94  BUN 6 8 9 9 7   CREATININE 0.35* 0.39* 0.39* 0.46* 0.46*  CALCIUM 8.1* 8.4 8.7 8.5 8.2*  MG 2.4  --   --  1.8 2.0  PHOS 1.8*  --   --  2.1* 2.7   Estimated Creatinine Clearance: 60.7 ml/min (by C-G formula based on Cr of 0.46).   LIVER No results found for this basename: AST, ALT, ALKPHOS, BILITOT, PROT, ALBUMIN, INR,  in the last 168 hours   INFECTIOUS  Recent Labs Lab 10/09/13 1840  LATICACIDVEN 0.6     ENDOCRINE CBG (last 3)   Recent Labs  10/12/13 1558 10/12/13 1903 10/13/13 0403  GLUCAP 165* 95 90      CXR None today  ASSESSMENT / PLAN:  PULMONARY A: Acute on Chronic Hypercapnic Respiratory Failure: in setting of AECOP. O2 requirement down to baseline. ? Volume overload with elevated BNP  RUL PNA on CT  CT chest  1/22 > Neg PE , few scattered nodules, RUL infiltrate >Viral panel neg   P:   -oral  Prednisone, taper today -cont Budesonide/-brovana added 1/24  (on Advair at home )  -Cough suppression -f/u CT in 6 months.-set up at discharge with Dr young to also determine timing -Spiriva , cont on albuterol neb As needed   - Pulse Ox with ambulation.  CARDIOVASCULAR A: Hypertention Cardiac enzymes neg  bnp elevated >echo 1/24 w/ nml EF , grade 1 diastolic dysfunction  1/25 >bnp tr down w/ diuresis , scr /bp stable , good UOP  W/ even bal (no acute I/O over last 24 hours)  P:  PRN Hydralazine  Cardiac monitoring Lasix hold  RENAL A:  Hypokalemia  hypophosphatemia resolved Hypomagnesemia resolved P:   Supportive Care K+ x2. Daily BMP. Lasix Follow hco2 on bmet  GASTROINTESTINAL A:   No acute issues P:   Regular diet PPI dc if not home med and tolerating diet diet tolerated  HEMATOLOGIC A:  VTE prophylaxis P:  SQ heparin Ambulate if able, check sats, then dc sub q he if able  INFECTIOUS A:  RUL CAP/ neg viral panel  >cx neg, narrow abx   P:    Completed Abx course  ENDOCRINE A:  Mild Hyperglycemia on steroids - improved P:   Monitor Glucose  SSI if glucose consistently > 180  NEUROLOGIC A:  Anxiety - On home xanax 1mg  Three times a day  - decrease dose 1/2 As needed  -dont d/c abruptly if possible  P:   cont xanax As needed   D/C MSO4 nebs Ambulate with pulse ox She reportes to be at baseline today  TODAY'S SUMMARY:  Severe COPD complicated by CAP, has completed Abx course.  Down to home O2 requirements. F/U hypoK+, Possible discharge home today vs transfer to medsurg.  , DO PGY-1  10/13/2013, 6:49 AM Pager [phone]   I have fully examined this patient and agree with above findings.    And edite dinufll  [phone]. Mcarthur Rossetti, MD, FACP Pgr: [phone] [facility]

## 2013-10-13 NOTE — Progress Notes (Signed)
1600 Discharge instructions given and reviewed with patient and family at bedside. All questions answered. Patient states that she understands instructions and has no further questions at this time. All belongings discharged with patient. Patient discharged via wheelchair with family and all belongings at side.

## 2013-10-15 ENCOUNTER — Ambulatory Visit (INDEPENDENT_AMBULATORY_CARE_PROVIDER_SITE_OTHER): Payer: Medicare Other | Admitting: Internal Medicine

## 2013-10-15 ENCOUNTER — Encounter: Payer: Self-pay | Admitting: Internal Medicine

## 2013-10-15 VITALS — BP 150/100 | HR 86 | Ht 61.0 in | Wt 108.2 lb

## 2013-10-15 DIAGNOSIS — J441 Chronic obstructive pulmonary disease with (acute) exacerbation: Secondary | ICD-10-CM

## 2013-10-15 DIAGNOSIS — R918 Other nonspecific abnormal finding of lung field: Secondary | ICD-10-CM

## 2013-10-15 MED ORDER — PREDNISONE 10 MG PO TABS: 10.0000 mg | ORAL_TABLET | Freq: Every day | ORAL | Status: DC

## 2013-10-15 NOTE — Patient Instructions (Signed)
Get help to get your medicaid card straight, so you can get a primary doctor.   We will be getting a follow-up CT to check on the lung nodules in June  Script for prednisone to finish your taper then take 10 mg once every day.  Ok to use your regular meds as directed and continue oxygen 2-3L/ [facility]

## 2013-10-15 NOTE — Progress Notes (Signed)
Patient ID: Wendy [name], female    DOB: Jul 27, 1961, 53 y.o.   MRN: [id number] HPI 03/15/11- 53 yoF smoker followed for COPD, tobacco cessation support, rhinitis, complicated by hx anxiety Last here November 06, 2010- note reviewed. She has not smoked since December 2011.  Using her oxygen for the past week since she caught a cold with head and chest congestion, purulent sputum, hoarse. No fever or sore throat. Using her regular meds. Using her nebulizer regularly.  Had 7 teeth pulled in May.   08/16/11- 53 yoF smoker followed for COPD, tobacco cessation support, rhinitis, complicated by hx anxiety Has had flu vaccine. October 28 she [facility] to [facility] with acute bronchitis after catching a cold. Got better after treatment there but not well. We called in prednisone and a Z-Pak. She still feels some chest congestion, cough with clear mucus, feels tight. Denies fever, chest pain or blood. Has had oxygen for several months, used most of the time. Says she quit cigarettes this year by using an electronic cigarette with a mild insert. Nasal congestion especially left side. Occasional blood from left nostril. Chest x-ray: 07/15/2011-hyperinflation, no acute disease. I reviewed images.  02/25/12- 53 yoF smoker followed for COPD, tobacco cessation support, rhinitis, complicated by hx anxiety : c/o sob, congestion, and chest tightness. She is remaining off of cigarettes. Her COPD assessment test (CAT) score is 30/40 She had done well for the last several months as her last visit. Just 3 days ago and acute illness began as a gastroenteritis followed by shortness of breath, nonproductive cough and wheeze. She increased her oxygen. Using her nebulizer. Feels better today.  10/16/12- 53 yoF formersmoker followed for COPD, tobacco cessation support, rhinitis, complicated by hx anxiety follow-up: Pt c/o increased SOB with activity that has gradually been worse since last OV.  Antibiotics 2 or 3  times for bronchitis since last here. Needs flu vaccine. Oxygen 2-3 L/Advanced. Nebulizer helps. Not acute-this is baseline for her.  02/13/13- 53 yoF former smoker followed for COPD, tobacco cessation support, rhinitis, complicated by hx anxiety COPD w/ PFT. Pt reports breathing has been terrible. has a little occasional dry cough, chest tx but no wheezing.  Stays short of breath. Had a Z-Pak 2 weeks ago for bronchitis.still feels tight if she takes a deep breath. Rarely any mucus. No chest pain or ankle edema. O2 2-3L/ Advanced. After discussion, she is not interested in lung transplant. CXR 3/13 /14 IMPRESSION:  Old granulomatous disease and chronic hyperinflation.  No acute abnormalities.  Original Report Authenticated By: Ulyses Southward, M.D. PFT 02/13/2013-very severe obstructive airways disease with insignificant response to bronchodilator, diffusion severely reduced. Patient was unable to perform lung volume test. FVC 1.23/39%, FEV1 0.48/19%, FEV1/FVC 0.39/49%, FEF 25-75% 0.18/7%, DLCO 46%.  03/12/13- 53 yoF former smoker followed for COPD, tobacco cessation support, rhinitis, complicated by hx anxiety FOLLOWS FOR: pt wanted to discuss lung transplant- was told about it on 02-13-13. Wants more information. Remains off cigarettes.   Daughter here Feeling stable with no acute issues. Continuous O2 2.5L/ Advanced. We discussed progression of lung disease, outlined limitations of lung transplant and common criteria. Her disease is severe enough that she may be in her transplant window.  Preliminary discussion of end of life.  08/07/13- 53 yoF former smoker followed for COPD, tobacco cessation support, rhinitis, complicated by hx anxiety FOLLOWS FOR: having hard time with breathing; put on abx and prednisone. Finishing pred and doxy. Feels need to extend course- still  green with cough and chest is tight. Incidental mention of extensive rash- onset 2 years ago. CXR 11/27/12 IMPRESSION:  Old  granulomatous disease and chronic hyperinflation.  No acute abnormalities.  Original Report Authenticated By: Ulyses Southward, M.D.  10/15/13- 53 yoF former smoker followed for COPD, tobacco cessation support, rhinitis, complicated by hx anxiety                Daughter here Post hospital- 1/19-10/24/13- acute on chronic respiratory failure, COPD exacerbation with right upper lobe community pneumonia, diastolic dysfunction.  O2 2-3L/ Advanced Feeling much better. Mild residual dry cough as she tapers prednisone. Not smoking. CT chest 10/08/13 IMPRESSION:  1. No evidence of pulmonary embolus.  2. Focal right upper lobe pneumonia noted.  3. Few scattered small pulmonary nodules seen, measuring up to 5 mm  in size. If the patient is at high risk for bronchogenic carcinoma,  follow-up chest CT at 6-12 months is recommended. If the patient is  at low risk for bronchogenic carcinoma, follow-up chest CT at 12  months is recommended. This recommendation follows the consensus  statement: Guidelines for Management of Small Pulmonary Nodules  Detected on CT Scans: A Statement from the Fleischner Society as  published in Radiology 2005;237:395-400.  Electronically Signed  By: Roanna Raider M.D.  On: 10/08/2013 05:07  Review of Systems-per HPI Constitutional:   No-   weight loss, night sweats, fevers, chills, fatigue, lassitude. HEENT:   No-  headaches, difficulty swallowing, tooth/dental problems, sore throat,       No-  sneezing, itching, ear ache, +nasal congestion, post nasal drip,  CV:  No-   chest pain, orthopnea, PND, swelling in lower extremities, anasarca,  dizziness, palpitations Resp: +Persistent shortness of breath with exertion or at rest.              + productive cough,  + non-productive cough,  No- coughing up of blood.             +change in color of mucus.  No- wheezing.   Skin: +psoriasis rash GI:  No-   heartburn, indigestion, abdominal pain, nausea, vomiting,  GU:  MS:  No-   joint  pain or swelling.   Neuro-     nothing unusual Psych:  No- change in mood or affect. No depression or anxiety.  No memory loss.   Objective:   Physical Exam General- Alert, Oriented, Affect-appropriate, Distress- none acute. O2 2.5L/ 96%, slender Skin- +psoriasis plaques on the legs, trunk,  and nail changes. Lymphadenopathy- none Head- atraumatic            Eyes- Gross vision intact, PERRLA, conjunctivae clear secretions            Ears- Hearing, canals-normal            Nose- turbinate edema, + External and Septal dev, mucus, No polyps, erosion, +crusting and                    blood L             Throat- Mallampati II , mucosa clear , drainage- none, tonsils- atrophic Neck- flexible , trachea midline, no stridor , thyroid nl, carotid no bruit Chest - symmetrical excursion , unlabored           Heart/CV- RRR , no murmur , no gallop  , no rub, nl s1 s2                           -  JVD- none , edema- none, stasis changes- none, varices- none           Lung- +very distant, wheeze+trace, dry cough with deep breath , dullness-none, rub- none.                             + pursed lips           Chest wall-  Abd-  Br/ Gen/ Rectal- Not done, not indicated Extrem- cyanosis- none, clubbing, none, atrophy- none, strength- nl Neuro- grossly intact to observation

## 2013-10-31 ENCOUNTER — Other Ambulatory Visit: Payer: Self-pay | Admitting: Internal Medicine

## 2013-11-08 DIAGNOSIS — R918 Other nonspecific abnormal finding of lung field: Secondary | ICD-10-CM | POA: Insufficient documentation

## 2013-11-08 NOTE — Assessment & Plan Note (Signed)
Returning to baseline after pneumonia and exacerbation requiring hospital stay. Still tapering prednisone. Remains oxygen dependent.

## 2013-11-16 ENCOUNTER — Other Ambulatory Visit: Payer: Self-pay | Admitting: Internal Medicine

## 2013-11-16 ENCOUNTER — Telehealth: Payer: Self-pay | Admitting: Internal Medicine

## 2013-11-16 ENCOUNTER — Ambulatory Visit: Payer: Medicare Other | Admitting: Internal Medicine

## 2013-11-16 MED ORDER — PREDNISONE 10 MG PO TABS: 10.0000 mg | ORAL_TABLET | Freq: Every day | ORAL | Status: DC

## 2013-11-16 NOTE — Telephone Encounter (Signed)
Per CY, pt is to take 10mg  daily. This has been sent in to [facility]. Pt is aware. Nothing further is needed.

## 2013-11-17 MED ORDER — PREDNISONE 10 MG PO TABS: 10.0000 mg | ORAL_TABLET | Freq: Every day | ORAL | Status: DC

## 2013-11-17 NOTE — Addendum Note (Signed)
Addended by: Boone Master E on: 11/17/2013 09:50 AM   Modules accepted: Orders

## 2013-11-17 NOTE — Telephone Encounter (Signed)
Rx was printed rather than sent to pharmacy This has been done

## 2013-11-23 ENCOUNTER — Other Ambulatory Visit: Payer: Self-pay | Admitting: Internal Medicine

## 2013-11-24 ENCOUNTER — Telehealth: Payer: Self-pay | Admitting: Internal Medicine

## 2013-11-24 NOTE — Telephone Encounter (Signed)
Noted. I have documented this on pt's ov notes on CY's schedule.

## 2013-11-24 NOTE — Telephone Encounter (Signed)
Pt was inquiring about refill on Advair. This was sent to her pharmacy yesterday, 11/23/13. Pt is aware. Nothing further was needed.

## 2013-12-02 ENCOUNTER — Ambulatory Visit: Payer: Medicare Other | Admitting: Internal Medicine

## 2013-12-07 ENCOUNTER — Ambulatory Visit (INDEPENDENT_AMBULATORY_CARE_PROVIDER_SITE_OTHER): Payer: Medicare Other | Admitting: Internal Medicine

## 2013-12-07 ENCOUNTER — Encounter: Payer: Self-pay | Admitting: Internal Medicine

## 2013-12-07 VITALS — BP 148/76 | HR 94 | Ht 61.0 in | Wt 125.6 lb

## 2013-12-07 DIAGNOSIS — J9620 Acute and chronic respiratory failure, unspecified whether with hypoxia or hypercapnia: Secondary | ICD-10-CM

## 2013-12-07 DIAGNOSIS — R918 Other nonspecific abnormal finding of lung field: Secondary | ICD-10-CM

## 2013-12-07 DIAGNOSIS — J441 Chronic obstructive pulmonary disease with (acute) exacerbation: Secondary | ICD-10-CM

## 2013-12-07 MED ORDER — AZITHROMYCIN 250 MG PO TABS: ORAL_TABLET | ORAL | Status: DC

## 2013-12-07 NOTE — Progress Notes (Signed)
Patient ID: Wendy [name], female    DOB: Jul 27, 1961, 53 y.o.   MRN: [id number] HPI 03/15/11- 34 yoF smoker followed for COPD, tobacco cessation support, rhinitis, complicated by hx anxiety Last here November 06, 2010- note reviewed. She has not smoked since December 2011.  Using her oxygen for the past week since she caught a cold with head and chest congestion, purulent sputum, hoarse. No fever or sore throat. Using her regular meds. Using her nebulizer regularly.  Had 7 teeth pulled in May.   08/16/11- 50 yoF smoker followed for COPD, tobacco cessation support, rhinitis, complicated by hx anxiety Has had flu vaccine. October 28 she went to [facility] with acute bronchitis after catching a cold. Got better after treatment there but not well. We called in prednisone and a Z-Pak. She still feels some chest congestion, cough with clear mucus, feels tight. Denies fever, chest pain or blood. Has had oxygen for several months, used most of the time. Says she quit cigarettes this year by using an electronic cigarette with a mild insert. Nasal congestion especially left side. Occasional blood from left nostril. Chest x-ray: 07/15/2011-hyperinflation, no acute disease. I reviewed images.  02/25/12- 50 yoF smoker followed for COPD, tobacco cessation support, rhinitis, complicated by hx anxiety : c/o sob, congestion, and chest tightness. She is remaining off of cigarettes. Her COPD assessment test (CAT) score is 30/40 She had done well for the last several months as her last visit. Just 3 days ago and acute illness began as a gastroenteritis followed by shortness of breath, nonproductive cough and wheeze. She increased her oxygen. Using her nebulizer. Feels better today.  10/16/12- 51 yoF formersmoker followed for COPD, tobacco cessation support, rhinitis, complicated by hx anxiety follow-up: Pt c/o increased SOB with activity that has gradually been worse since last OV.  Antibiotics 2 or 3  times for bronchitis since last here. Needs flu vaccine. Oxygen 2-3 L/Advanced. Nebulizer helps. Not acute-this is baseline for her.  02/13/13- 52 yoF former smoker followed for COPD, tobacco cessation support, rhinitis, complicated by hx anxiety COPD w/ PFT. Pt reports breathing has been terrible. has a little occasional dry cough, chest tx but no wheezing.  Stays short of breath. Had a Z-Pak 2 weeks ago for bronchitis.still feels tight if she takes a deep breath. Rarely any mucus. No chest pain or ankle edema. O2 2-3L/ Advanced. After discussion, she is not interested in lung transplant. CXR 3/13 /14 IMPRESSION:  Old granulomatous disease and chronic hyperinflation.  No acute abnormalities.  Original Report Authenticated By: Ulyses Southward, M.D. PFT 02/13/2013-very severe obstructive airways disease with insignificant response to bronchodilator, diffusion severely reduced. Patient was unable to perform lung volume test. FVC 1.23/39%, FEV1 0.48/19%, FEV1/FVC 0.39/49%, FEF 25-75% 0.18/7%, DLCO 46%.  03/12/13- 53 yoF former smoker followed for COPD, tobacco cessation support, rhinitis, complicated by hx anxiety FOLLOWS FOR: pt wanted to discuss lung transplant- was told about it on 02-13-13. Wants more information. Remains off cigarettes.   Daughter here Feeling stable with no acute issues. Continuous O2 2.5L/ Advanced. We discussed progression of lung disease, outlined limitations of lung transplant and common criteria. Her disease is severe enough that she may be in her transplant window.  Preliminary discussion of end of life.  08/07/13- 31 yoF former smoker followed for COPD, tobacco cessation support, rhinitis, complicated by hx anxiety FOLLOWS FOR: having hard time with breathing; put on abx and prednisone. Finishing pred and doxy. Feels need to extend course- still  green with cough and chest is tight. Incidental mention of extensive rash- onset 2 years ago. CXR 11/27/12 IMPRESSION:  Old  granulomatous disease and chronic hyperinflation.  No acute abnormalities.  Original Report Authenticated By: Ulyses Southward, M.D.  10/15/13- 53 yoF former smoker followed for COPD, tobacco cessation support, rhinitis, complicated by hx anxiety                Daughter here Post hospital- 1/19-10/24/13- acute on chronic respiratory failure, COPD exacerbation with right upper lobe community pneumonia, diastolic dysfunction.  O2 2-3L/ Advanced Feeling much better. Mild residual dry cough as she tapers prednisone. Not smoking. CT chest 10/08/13 IMPRESSION:  1. No evidence of pulmonary embolus.  2. Focal right upper lobe pneumonia noted.  3. Few scattered small pulmonary nodules seen, measuring up to 5 mm  in size. If the patient is at high risk for bronchogenic carcinoma,  follow-up chest CT at 6-12 months is recommended. If the patient is  at low risk for bronchogenic carcinoma, follow-up chest CT at 12  months is recommended. This recommendation follows the consensus  statement: Guidelines for Management of Small Pulmonary Nodules  Detected on CT Scans: A Statement from the Fleischner Society as  published in Radiology 2005;237:395-400.  Electronically Signed  By: Roanna Raider M.D.  On: 10/08/2013 05:07  12/07/13- 58 yoF former smoker followed for COPD, tobacco cessation support, rhinitis, complicated by hx anxiety                Daughter here FOLLOWS FOR: feels like she is having a flare up and wonders if she is getting a cold as well-if abx given please print for her to take with her to pharmacy. Continues oxygen 3 L/Advanced, prednisone 10 mg daily maintenance Head and chest congestion/chest cold x3 days. Dry cough, no fever. Requalified for oxygen.  Review of Systems-per HPI Constitutional:   No-   weight loss, night sweats, fevers, chills, fatigue, lassitude. HEENT:   No-  headaches, difficulty swallowing, tooth/dental problems, sore throat,       No-  sneezing, itching, ear ache, +nasal  congestion, post nasal drip,  CV:  No-   chest pain, orthopnea, PND, swelling in lower extremities, anasarca,  dizziness, palpitations Resp: +Persistent shortness of breath with exertion or at rest.              No-productive cough,  + non-productive cough,  No- coughing up of blood.           No-change in color of mucus.  No- wheezing.   Skin: +psoriasis rash GI:  No-   heartburn, indigestion, abdominal pain, nausea, vomiting,  GU:  MS:  No-   joint pain or swelling.   Neuro-     nothing unusual Psych:  No- change in mood or affect. No depression or anxiety.  No memory loss.   Objective:   Physical Exam General- Alert, Oriented, Affect-appropriate, Distress- none acute. O2 2.5L/ 100%, slender Skin- +psoriasis plaques on the legs, trunk,  and nail changes. Lymphadenopathy- none Head- atraumatic            Eyes- Gross vision intact, PERRLA, conjunctivae clear secretions            Ears- Hearing, canals-normal            Nose- turbinate edema, + External and Septal dev, mucus, No polyps, erosion, +crusting and                    blood L  Throat- Mallampati II , mucosa clear , drainage- none, tonsils- atrophic Neck- flexible , trachea midline, no stridor , thyroid nl, carotid no bruit Chest - symmetrical excursion , unlabored           Heart/CV- RRR , no murmur , no gallop  , no rub, nl s1 s2                           - JVD- none , edema- none, stasis changes- none, varices- none           Lung- +very distant, wheeze+trace, +dry cough with deep breath , dullness-none,                        rub-none.+ pursed lips                                        Chest wall-  Abd-  Br/ Gen/ Rectal- Not done, not indicated Extrem- cyanosis- none, clubbing, none, atrophy- none, strength- nl Neuro- grossly intact to observation

## 2013-12-07 NOTE — Patient Instructions (Addendum)
Script printed for [employer] extra fluids, sips of liquids, throat lozenges, and otc cough med like Mucinex-DM or Delsym  When you feel able, try dropping down to prednisone 10 mg, 1/2 tab ( 5 mg) every day,  or alternate 10 mg with 5 mg every other day.  We will continue O2 2-3L/ min/ Advanced  Spinach, tomatoes and bananas are good sources of potassium. An otc product for potassium and magnesium is Slow-Mag

## 2013-12-14 ENCOUNTER — Telehealth: Payer: Self-pay | Admitting: Internal Medicine

## 2013-12-14 MED ORDER — FLUCONAZOLE 150 MG PO TABS: 150.0000 mg | ORAL_TABLET | Freq: Every day | ORAL | Status: DC

## 2013-12-14 NOTE — Telephone Encounter (Signed)
Pt is aware of CY's recs. Rx has been sent in. 

## 2013-12-14 NOTE — Telephone Encounter (Signed)
Spoke with pt. Reports sore throat, PND and dry cough. Has finished Zpack and is currently taking 10mg  of prednisone daily. SOB and chest tightness/heaviness is also present. Denies fever. Has been taking Mucinex with no relief. Wants to know how to proceed.  Allergies  Allergen Reactions  . Augmentin [Amoxicillin-Pot Clavulanate] Nausea Only  . Codeine Nausea Only  . Fexofenadine Nausea Only    allegra  . Other     PAIN MEDICATIONS-nausea    Current Outpatient Prescriptions on File Prior to Visit  Medication Sig Dispense Refill  . ADVAIR DISKUS 500-50 MCG/DOSE AEPB INHALE 1 PUFF INTO THE LUNGS 2 TIMES DAILY  60 each  3  . albuterol (PROVENTIL HFA;VENTOLIN HFA) 108 (90 BASE) MCG/ACT inhaler Inhale 2-3 puffs into the lungs every 4 (four) hours as needed for wheezing or shortness of breath (**Must last 30 days- per dr young**).      albuterol (PROVENTIL) (2.5 MG/3ML) 0.083% nebulizer solution Take 3 mLs (2.5 mg total) by nebulization 4 (four) times daily. DX.  492.8  300 mL  prn  . ALPRAZolam (XANAX) 1 MG tablet Take 1 tablet (1 mg total) by mouth 3 (three) times daily as needed for anxiety or sleep.  90 tablet  5  . azithromycin (ZITHROMAX) 250 MG tablet 2 today then one daily  6 each  0  . Emollient (EUCERIN) lotion Apply 1 Bottle topically as needed for dry skin (to body).      . fluticasone (FLONASE) 50 MCG/ACT nasal spray Place 2 sprays into the nose daily.  16 g  3  . predniSONE (DELTASONE) 10 MG tablet Take 1 tablet (10 mg total) by mouth daily with breakfast.  30 tablet  2  . SPIRIVA HANDIHALER 18 MCG inhalation capsule PLACE 1 CAPSULE INTO INHALER AND INHALE CONTENTS ONCE DAILY  30 capsule  3   No current facility-administered medications on file prior to visit.    CY - please advise. Thanks.

## 2013-12-14 NOTE — Telephone Encounter (Signed)
She may have thrush now to give the throat irritation. Offer Diflucan 150 mg, # 4, 1 daily x 4 days. Ok to use a coating cough syrup like Delsym.

## 2013-12-27 NOTE — Assessment & Plan Note (Signed)
Plan followup CT as discussed

## 2013-12-27 NOTE — Assessment & Plan Note (Signed)
P - Z-Pak, discussed how to taper steroids

## 2013-12-27 NOTE — Assessment & Plan Note (Addendum)
Oxygen dependent. Concerning anytime she catches a cold. We discussed symptomatic management. Z-Pak. Try tapering prednisone when able

## 2013-12-31 ENCOUNTER — Telehealth: Payer: Self-pay | Admitting: Internal Medicine

## 2013-12-31 MED ORDER — PREDNISONE 10 MG PO TABS: ORAL_TABLET | ORAL | Status: DC

## 2013-12-31 NOTE — Telephone Encounter (Signed)
Spoke with pt. She is wanting the quantity of prednisone increased. She reports CDY advised her she could take pred 10 mg BID if needed. We sent RX in for pred 10 mg QD #30. Please advise Dr. Maple Hudson thanks

## 2013-12-31 NOTE — Telephone Encounter (Signed)
Ok to increase prednisone 10 mg, # 60, 1-2 daily as needed/ directed ref x 2

## 2013-12-31 NOTE — Telephone Encounter (Signed)
Spoke with pt. Aware we will send in new RX for pt. Nothing further needed

## 2014-01-05 ENCOUNTER — Telehealth: Payer: Self-pay | Admitting: Internal Medicine

## 2014-01-05 MED ORDER — AZITHROMYCIN 250 MG PO TABS: ORAL_TABLET | ORAL | Status: DC

## 2014-01-05 NOTE — Telephone Encounter (Signed)
I called spoke with pt. She only reports nausea w/ Augmentin.  Aware we will d.c the amox and called in a ZPAK Nothing further needed and RX sent in

## 2014-01-05 NOTE — Telephone Encounter (Signed)
Our list says augmentin just caused nausea. If there was something more, like rash, please document.  Ok to d/c amoxacillin recommendation and instead offer Zpak

## 2014-01-05 NOTE — Telephone Encounter (Signed)
Call pt on hm phone [phone].Wendy [name]

## 2014-01-05 NOTE — Telephone Encounter (Signed)
I called made pt aware of cdy recs. Pt reports since she had a reaction to augmentin she is not comfortable with amox being called in. Please advise thanks

## 2014-01-05 NOTE — Telephone Encounter (Signed)
Recommend amoxacillin 500 mg, # 21, 1 three times daiy  Sorry, I am not comfortable going higher on the alprazolam.

## 2014-01-05 NOTE — Telephone Encounter (Signed)
Called spoke w/ pt. She wants to know if Dr. Maple Hudson will increase her alprazolam from 1 mg to 2 mg. She reports her written RX for alprazolam is for 1 mg TID PRN. She is having to take atleast 4 daily per pt. She reports the prednisone causes her to have shakes and can't sleep at night. Reason she wants this increased.  Also pt c/o prod cough yellow-green phlem, wheezing, chest tx, PND, nasal and chest congestion x  3days. She is still on prednisone as directed. Requesting recs.  Please advise Dr. Maple Hudson thanks  Allergies  Allergen Reactions  . Augmentin [Amoxicillin-Pot Clavulanate] Nausea Only  . Codeine Nausea Only  . Fexofenadine Nausea Only    allegra  . Other     PAIN MEDICATIONS-nausea     Current Outpatient Prescriptions on File Prior to Visit  Medication Sig Dispense Refill  . ADVAIR DISKUS 500-50 MCG/DOSE AEPB INHALE 1 PUFF INTO THE LUNGS 2 TIMES DAILY  60 each  3  . albuterol (PROVENTIL HFA;VENTOLIN HFA) 108 (90 BASE) MCG/ACT inhaler Inhale 2-3 puffs into the lungs every 4 (four) hours as needed for wheezing or shortness of breath (**Must last 30 days- per dr young**).      Marland Kitchen albuterol (PROVENTIL) (2.5 MG/3ML) 0.083% nebulizer solution Take 3 mLs (2.5 mg total) by nebulization 4 (four) times daily. DX.  492.8  300 mL  prn  . ALPRAZolam (XANAX) 1 MG tablet Take 1 tablet (1 mg total) by mouth 3 (three) times daily as needed for anxiety or sleep.  90 tablet  5  . Emollient (EUCERIN) lotion Apply 1 Bottle topically as needed for dry skin (to body).      . fluconazole (DIFLUCAN) 150 MG tablet Take 1 tablet (150 mg total) by mouth daily.  4 tablet  0  . fluticasone (FLONASE) 50 MCG/ACT nasal spray Place 2 sprays into the nose daily.  16 g  3  . predniSONE (DELTASONE) 10 MG tablet 1-2 tabs daily as needed  60 tablet  2  . SPIRIVA HANDIHALER 18 MCG inhalation capsule PLACE 1 CAPSULE INTO INHALER AND INHALE CONTENTS ONCE DAILY  30 capsule  3   No current facility-administered medications  on file prior to visit.

## 2014-01-26 ENCOUNTER — Other Ambulatory Visit: Payer: Self-pay | Admitting: Internal Medicine

## 2014-02-09 ENCOUNTER — Telehealth: Payer: Self-pay | Admitting: Internal Medicine

## 2014-02-09 MED ORDER — ALPRAZOLAM 1 MG PO TABS: 1.0000 mg | ORAL_TABLET | Freq: Three times a day (TID) | ORAL | Status: DC | PRN

## 2014-02-09 NOTE — Telephone Encounter (Signed)
Ok to refill xanax if due.  Ok to give TDAP if she wants to get it here.

## 2014-02-09 NOTE — Telephone Encounter (Signed)
Called and spoke with pt and she is aware that the rx has been called to her pharmacy.  Pt stated that she will call back to schedule the time to come in for the tdap.

## 2014-02-09 NOTE — Telephone Encounter (Signed)
Spoke with patient-she wants refill called to pharmacy for Xanax. Pt is aware that I will need to get approval from CY before I can call in pharmacy.   Pt wants to know if she should get Whooping cough shot as her daughter is "high risk" pregnancy and once born in August the patient will be keeping the baby for her daughter during the day.   CY, Please advise on refill of Xanax and Whooping cough shot. Thanks.

## 2014-02-10 ENCOUNTER — Telehealth: Payer: Self-pay | Admitting: Internal Medicine

## 2014-02-10 NOTE — Telephone Encounter (Signed)
Advised pt that I am unsure why refills were not placed on her prescription. She will call when she is due again. We will make sure refills are on next rx.

## 2014-02-19 ENCOUNTER — Ambulatory Visit: Payer: Medicare Other

## 2014-03-03 ENCOUNTER — Other Ambulatory Visit: Payer: Self-pay | Admitting: Internal Medicine

## 2014-03-03 ENCOUNTER — Telehealth: Payer: Self-pay | Admitting: Internal Medicine

## 2014-03-03 MED ORDER — TIOTROPIUM BROMIDE MONOHYDRATE 18 MCG IN CAPS: ORAL_CAPSULE | RESPIRATORY_TRACT | Status: DC

## 2014-03-03 MED ORDER — FLUTICASONE PROPIONATE 50 MCG/ACT NA SUSP: 2.0000 | Freq: Every day | NASAL | Status: DC

## 2014-03-03 NOTE — Telephone Encounter (Signed)
Called spoke with pt. Aware of recs. RX's sent in. Nothing further needed 

## 2014-03-09 ENCOUNTER — Other Ambulatory Visit: Payer: Self-pay | Admitting: Internal Medicine

## 2014-03-09 NOTE — Telephone Encounter (Signed)
Ok to refill 

## 2014-03-09 NOTE — Telephone Encounter (Signed)
CY, Please advise if okay to refill. Thanks.  

## 2014-03-31 ENCOUNTER — Other Ambulatory Visit: Payer: Self-pay | Admitting: Internal Medicine

## 2014-04-13 ENCOUNTER — Other Ambulatory Visit: Payer: Self-pay | Admitting: Internal Medicine

## 2014-04-13 NOTE — Telephone Encounter (Signed)
CY Please advise if okay to refill. Thanks.  

## 2014-04-14 ENCOUNTER — Other Ambulatory Visit: Payer: Self-pay | Admitting: Internal Medicine

## 2014-04-14 ENCOUNTER — Telehealth: Payer: Self-pay | Admitting: Internal Medicine

## 2014-04-14 MED ORDER — ALPRAZOLAM 1 MG PO TABS: ORAL_TABLET | ORAL | Status: DC

## 2014-04-14 NOTE — Telephone Encounter (Signed)
Ok to refill 

## 2014-04-14 NOTE — Telephone Encounter (Signed)
Called and spoke with pt and she stated that she is needing a refill of her alprazolam.  Last filled 02/2014 by CY.  CY please advise if ok to send in refill for this medication.  Thanks  Last ov--12/07/2013 Next ov--06/10/2014  Allergies  Allergen Reactions  . Augmentin [Amoxicillin-Pot Clavulanate] Nausea Only  . Codeine Nausea Only  . Fexofenadine Nausea Only    allegra  . Other     PAIN MEDICATIONS-nausea    Current Outpatient Prescriptions on File Prior to Visit  Medication Sig Dispense Refill  . ADVAIR DISKUS 500-50 MCG/DOSE AEPB INHALE 1 PUFF INTO THE LUNGS 2 TIMES DAILY  60 each  3  . albuterol (PROVENTIL HFA;VENTOLIN HFA) 108 (90 BASE) MCG/ACT inhaler Inhale 2-3 puffs into the lungs every 4 (four) hours as needed for wheezing or shortness of breath (**Must last 30 days- per dr young**).      Marland Kitchen albuterol (PROVENTIL) (2.5 MG/3ML) 0.083% nebulizer solution Take 3 mLs (2.5 mg total) by nebulization 4 (four) times daily. DX.  492.8  300 mL  prn  . ALPRAZolam (XANAX) 1 MG tablet TAKE 1 TABLET BY MOUTH 3 TIMES A DAY AS NEEDED FOR ANXIETY OR SLEEP *FILL 5/28  90 tablet  0  . azithromycin (ZITHROMAX) 250 MG tablet Take as directed  6 tablet  0  . Emollient (EUCERIN) lotion Apply 1 Bottle topically as needed for dry skin (to body).      . fluconazole (DIFLUCAN) 150 MG tablet Take 1 tablet (150 mg total) by mouth daily.  4 tablet  0  . fluticasone (FLONASE) 50 MCG/ACT nasal spray Place 2 sprays into both nostrils daily.  16 g  3  . predniSONE (DELTASONE) 10 MG tablet TAKE 1 TABLET BY MOUTH DAILY WITH BREAKFAST  30 tablet  2  . tiotropium (SPIRIVA HANDIHALER) 18 MCG inhalation capsule PLACE 1 CAPSULE INTO INHALER AND INHALE CONTENTS ONCE DAILY  30 capsule  3   No current facility-administered medications on file prior to visit.

## 2014-04-14 NOTE — Telephone Encounter (Signed)
RX has been called into the pharm. Nothing further needed 

## 2014-05-06 ENCOUNTER — Other Ambulatory Visit: Payer: Self-pay | Admitting: Internal Medicine

## 2014-05-13 ENCOUNTER — Telehealth: Payer: Self-pay | Admitting: Internal Medicine

## 2014-05-13 ENCOUNTER — Other Ambulatory Visit: Payer: Self-pay | Admitting: Internal Medicine

## 2014-05-13 MED ORDER — PREDNISONE 10 MG PO TABS: ORAL_TABLET | ORAL | Status: DC

## 2014-05-13 MED ORDER — ALPRAZOLAM 1 MG PO TABS: ORAL_TABLET | ORAL | Status: DC

## 2014-05-13 NOTE — Telephone Encounter (Signed)
Pt requesting refill for Xanax 1 mg # 90 tab.  Last filled ono 04/14/14.  Please advise if ok to refill. Also spoke with pharmacy and clarified dose of Prednisone.  Allergies  Allergen Reactions  . Augmentin [Amoxicillin-Pot Clavulanate] Nausea Only  . Codeine Nausea Only  . Fexofenadine Nausea Only    allegra  . Other     PAIN MEDICATIONS-nausea    Current Outpatient Prescriptions on File Prior to Visit  Medication Sig Dispense Refill  . ADVAIR DISKUS 500-50 MCG/DOSE AEPB INHALE 1 PUFF INTO THE LUNGS 2 TIMES DAILY  60 each  3  . albuterol (PROVENTIL HFA;VENTOLIN HFA) 108 (90 BASE) MCG/ACT inhaler Inhale 2-3 puffs into the lungs every 4 (four) hours as needed for wheezing or shortness of breath (**Must last 30 days- per dr young**).      Marland Kitchen albuterol (PROVENTIL) (2.5 MG/3ML) 0.083% nebulizer solution Take 3 mLs (2.5 mg total) by nebulization 4 (four) times daily. DX.  492.8  300 mL  prn  . ALPRAZolam (XANAX) 1 MG tablet TAKE 1 TABLET BY MOUTH 3 TIMES A DAY AS NEEDED FOR ANXIETY OR SLEEP *FILL 5/28  90 tablet  0  . azithromycin (ZITHROMAX) 250 MG tablet Take as directed  6 tablet  0  . Emollient (EUCERIN) lotion Apply 1 Bottle topically as needed for dry skin (to body).      . fluconazole (DIFLUCAN) 150 MG tablet Take 1 tablet (150 mg total) by mouth daily.  4 tablet  0  . fluticasone (FLONASE) 50 MCG/ACT nasal spray Place 2 sprays into both nostrils daily.  16 g  3  . tiotropium (SPIRIVA HANDIHALER) 18 MCG inhalation capsule PLACE 1 CAPSULE INTO INHALER AND INHALE CONTENTS ONCE DAILY  30 capsule  3   No current facility-administered medications on file prior to visit.

## 2014-05-13 NOTE — Telephone Encounter (Signed)
Ok to refill 

## 2014-05-13 NOTE — Telephone Encounter (Signed)
Refill of the xanax has been called to the pts and she is aware.    appt has been moved up for the pt and nothing further is needed.

## 2014-06-01 ENCOUNTER — Ambulatory Visit: Payer: Medicare Other | Admitting: Internal Medicine

## 2014-06-10 ENCOUNTER — Other Ambulatory Visit: Payer: Self-pay | Admitting: Internal Medicine

## 2014-06-10 ENCOUNTER — Ambulatory Visit: Payer: Medicare Other | Admitting: Internal Medicine

## 2014-06-11 ENCOUNTER — Telehealth: Payer: Self-pay | Admitting: Internal Medicine

## 2014-06-11 MED ORDER — AZITHROMYCIN 250 MG PO TABS: ORAL_TABLET | ORAL | Status: DC

## 2014-06-11 MED ORDER — ALPRAZOLAM 1 MG PO TABS: ORAL_TABLET | ORAL | Status: DC

## 2014-06-11 NOTE — Telephone Encounter (Signed)
Offer Zpak 

## 2014-06-11 NOTE — Telephone Encounter (Signed)
Ok -done today

## 2014-06-11 NOTE — Telephone Encounter (Signed)
Xanax called into pt pharmacy - [facility] [address] Pt c/o increased chest congestion(not much color change), tightness and cough Using prednisone 10-20mg  qd Requesting an abx be called in to [facility] [address] Feels her COPD is worsening  Please advise Dr Maple Hudson. Thanks Allergies  Allergen Reactions  . Augmentin [Amoxicillin-Pot Clavulanate] Nausea Only  . Codeine Nausea Only  . Fexofenadine Nausea Only    allegra  . Other     PAIN MEDICATIONS-nausea

## 2014-06-11 NOTE — Telephone Encounter (Signed)
Pt aware of recs. RX sent in. Nothing further needed 

## 2014-06-11 NOTE — Telephone Encounter (Signed)
Ok to refill xanax  Please make routine f/u appointment Dec or Jan

## 2014-06-11 NOTE — Telephone Encounter (Signed)
Approved earlier today.

## 2014-06-11 NOTE — Telephone Encounter (Signed)
CY please advise if ok to refill thanks

## 2014-06-11 NOTE — Telephone Encounter (Signed)
Pt last had alprazolam 1 mg refilled 05/13/14 #90 x 0 refills Last OV 12/07/13 No pending appt. Please advise CDY thanks

## 2014-07-01 ENCOUNTER — Encounter (HOSPITAL_COMMUNITY): Payer: Self-pay | Admitting: Emergency Medicine

## 2014-07-01 ENCOUNTER — Emergency Department (HOSPITAL_COMMUNITY): Payer: Medicare Other

## 2014-07-01 ENCOUNTER — Inpatient Hospital Stay (HOSPITAL_COMMUNITY)
Admission: EM | Admit: 2014-07-01 | Discharge: 2014-07-04 | DRG: 190 | Disposition: A | Discharge status: Home / Self Care | Payer: Medicare Other | Attending: Internal Medicine | Admitting: Internal Medicine

## 2014-07-01 DIAGNOSIS — J9621 Acute and chronic respiratory failure with hypoxia: Secondary | ICD-10-CM | POA: Diagnosis present

## 2014-07-01 DIAGNOSIS — J9620 Acute and chronic respiratory failure, unspecified whether with hypoxia or hypercapnia: Secondary | ICD-10-CM | POA: Diagnosis present

## 2014-07-01 DIAGNOSIS — L4050 Arthropathic psoriasis, unspecified: Secondary | ICD-10-CM | POA: Diagnosis present

## 2014-07-01 DIAGNOSIS — Z79899 Other long term (current) drug therapy: Secondary | ICD-10-CM

## 2014-07-01 DIAGNOSIS — Z66 Do not resuscitate: Secondary | ICD-10-CM | POA: Diagnosis present

## 2014-07-01 DIAGNOSIS — J441 Chronic obstructive pulmonary disease with (acute) exacerbation: Secondary | ICD-10-CM | POA: Diagnosis not present

## 2014-07-01 DIAGNOSIS — J449 Chronic obstructive pulmonary disease, unspecified: Secondary | ICD-10-CM | POA: Diagnosis present

## 2014-07-01 DIAGNOSIS — F329 Major depressive disorder, single episode, unspecified: Secondary | ICD-10-CM | POA: Diagnosis present

## 2014-07-01 DIAGNOSIS — J9601 Acute respiratory failure with hypoxia: Secondary | ICD-10-CM

## 2014-07-01 DIAGNOSIS — Z9981 Dependence on supplemental oxygen: Secondary | ICD-10-CM

## 2014-07-01 DIAGNOSIS — I5032 Chronic diastolic (congestive) heart failure: Secondary | ICD-10-CM | POA: Diagnosis present

## 2014-07-01 DIAGNOSIS — Z87891 Personal history of nicotine dependence: Secondary | ICD-10-CM

## 2014-07-01 DIAGNOSIS — R0600 Dyspnea, unspecified: Secondary | ICD-10-CM | POA: Diagnosis not present

## 2014-07-01 DIAGNOSIS — L409 Psoriasis, unspecified: Secondary | ICD-10-CM

## 2014-07-01 LAB — BASIC METABOLIC PANEL
ANION GAP: 15 (ref 5–15)
BUN: <3 mg/dL — ABNORMAL LOW (ref 6–23)
CALCIUM: 9.2 mg/dL (ref 8.4–10.5)
CO2: 33 mEq/L — ABNORMAL HIGH (ref 19–32)
Chloride: 92 mEq/L — ABNORMAL LOW (ref 96–112)
Creatinine, Ser: 0.56 mg/dL (ref 0.50–1.10)
Glucose, Bld: 111 mg/dL — ABNORMAL HIGH (ref 70–99)
Potassium: 3.7 mEq/L (ref 3.7–5.3)
SODIUM: 140 meq/L (ref 137–147)

## 2014-07-01 LAB — CBC
HCT: 37.5 % (ref 36.0–46.0)
Hemoglobin: 12.2 g/dL (ref 12.0–15.0)
MCH: 31.1 pg (ref 26.0–34.0)
MCHC: 32.5 g/dL (ref 30.0–36.0)
MCV: 95.7 fL (ref 78.0–100.0)
Platelets: 167 10*3/uL (ref 150–400)
RBC: 3.92 MIL/uL (ref 3.87–5.11)
RDW: 15.6 % — AB (ref 11.5–15.5)
WBC: 5.3 10*3/uL (ref 4.0–10.5)

## 2014-07-01 LAB — I-STAT TROPONIN, ED: Troponin i, poc: 0 ng/mL (ref 0.00–0.08)

## 2014-07-01 LAB — PRO B NATRIURETIC PEPTIDE: PRO B NATRI PEPTIDE: 167.9 pg/mL — AB (ref 0–125)

## 2014-07-01 LAB — D-DIMER, QUANTITATIVE: D-Dimer, Quant: 2.3 ug/mL-FEU — ABNORMAL HIGH (ref 0.00–0.48)

## 2014-07-01 MED ORDER — TRIAMCINOLONE ACETONIDE 0.1 % EX CREA
1.0000 "application " | TOPICAL_CREAM | Freq: Two times a day (BID) | CUTANEOUS | Status: DC
  Administered 2014-07-01 – 2014-07-03 (×5): 1 via TOPICAL
  Filled 2014-07-01: qty 15
  Filled 2014-07-01: qty 454
  Filled 2014-07-01: qty 15

## 2014-07-01 MED ORDER — ALBUTEROL SULFATE (2.5 MG/3ML) 0.083% IN NEBU
5.0000 mg | INHALATION_SOLUTION | Freq: Once | RESPIRATORY_TRACT | Status: AC
  Administered 2014-07-01: 5 mg via RESPIRATORY_TRACT
  Filled 2014-07-01: qty 6

## 2014-07-01 MED ORDER — ENOXAPARIN SODIUM 40 MG/0.4ML ~~LOC~~ SOLN
40.0000 mg | SUBCUTANEOUS | Status: DC
  Administered 2014-07-01 – 2014-07-03 (×3): 40 mg via SUBCUTANEOUS
  Filled 2014-07-01 (×4): qty 0.4

## 2014-07-01 MED ORDER — SODIUM CHLORIDE 0.9 % IJ SOLN
3.0000 mL | Freq: Two times a day (BID) | INTRAMUSCULAR | Status: DC
  Administered 2014-07-01 – 2014-07-03 (×5): 3 mL via INTRAVENOUS

## 2014-07-01 MED ORDER — VITAMIN D 50 MCG (2000 UT) PO TABS: 2000.0000 [IU] | ORAL_TABLET | Freq: Every day | ORAL | Status: DC

## 2014-07-01 MED ORDER — METHYLPREDNISOLONE SODIUM SUCC 125 MG IJ SOLR
80.0000 mg | Freq: Two times a day (BID) | INTRAMUSCULAR | Status: DC
  Administered 2014-07-01 – 2014-07-02 (×2): 80 mg via INTRAVENOUS
  Filled 2014-07-01 (×4): qty 1.28

## 2014-07-01 MED ORDER — IOHEXOL 350 MG/ML SOLN
100.0000 mL | Freq: Once | INTRAVENOUS | Status: AC | PRN
  Administered 2014-07-01: 100 mL via INTRAVENOUS

## 2014-07-01 MED ORDER — ACETAMINOPHEN 325 MG PO TABS
650.0000 mg | ORAL_TABLET | Freq: Four times a day (QID) | ORAL | Status: DC | PRN
  Administered 2014-07-02: 650 mg via ORAL
  Filled 2014-07-01: qty 2

## 2014-07-01 MED ORDER — IPRATROPIUM BROMIDE 0.02 % IN SOLN
0.5000 mg | Freq: Once | RESPIRATORY_TRACT | Status: AC
  Administered 2014-07-01: 0.5 mg via RESPIRATORY_TRACT
  Filled 2014-07-01: qty 2.5

## 2014-07-01 MED ORDER — ALPRAZOLAM 0.5 MG PO TABS
1.0000 mg | ORAL_TABLET | Freq: Three times a day (TID) | ORAL | Status: DC
  Administered 2014-07-01 – 2014-07-04 (×9): 1 mg via ORAL
  Filled 2014-07-01 (×9): qty 2

## 2014-07-01 MED ORDER — ONDANSETRON HCL 4 MG/2ML IJ SOLN
4.0000 mg | Freq: Once | INTRAMUSCULAR | Status: AC
  Administered 2014-07-01: 4 mg via INTRAVENOUS
  Filled 2014-07-01: qty 2

## 2014-07-01 MED ORDER — MORPHINE SULFATE 4 MG/ML IJ SOLN
4.0000 mg | Freq: Once | INTRAMUSCULAR | Status: AC
  Administered 2014-07-01: 4 mg via INTRAVENOUS
  Filled 2014-07-01: qty 1

## 2014-07-01 MED ORDER — ACETAMINOPHEN 650 MG RE SUPP: 650.0000 mg | Freq: Four times a day (QID) | RECTAL | Status: DC | PRN

## 2014-07-01 MED ORDER — ALBUTEROL (5 MG/ML) CONTINUOUS INHALATION SOLN
10.0000 mg/h | INHALATION_SOLUTION | RESPIRATORY_TRACT | Status: DC
  Administered 2014-07-01: 10 mg/h via RESPIRATORY_TRACT

## 2014-07-01 MED ORDER — DIPHENHYDRAMINE HCL 25 MG PO CAPS
25.0000 mg | ORAL_CAPSULE | Freq: Every day | ORAL | Status: DC | PRN
  Administered 2014-07-01 – 2014-07-03 (×2): 25 mg via ORAL
  Filled 2014-07-01 (×4): qty 1

## 2014-07-01 MED ORDER — ALBUTEROL SULFATE (2.5 MG/3ML) 0.083% IN NEBU: 2.5000 mg | INHALATION_SOLUTION | Freq: Four times a day (QID) | RESPIRATORY_TRACT | Status: DC | PRN

## 2014-07-01 MED ORDER — VITAMIN D3 25 MCG (1000 UNIT) PO TABS
2000.0000 [IU] | ORAL_TABLET | Freq: Every day | ORAL | Status: DC
  Administered 2014-07-01 – 2014-07-04 (×4): 2000 [IU] via ORAL
  Filled 2014-07-01 (×4): qty 2

## 2014-07-01 MED ORDER — IPRATROPIUM-ALBUTEROL 0.5-2.5 (3) MG/3ML IN SOLN
3.0000 mL | RESPIRATORY_TRACT | Status: DC
  Administered 2014-07-01: 3 mL via RESPIRATORY_TRACT
  Filled 2014-07-01: qty 3

## 2014-07-01 MED ORDER — LORAZEPAM 2 MG/ML IJ SOLN
1.0000 mg | Freq: Once | INTRAMUSCULAR | Status: AC
  Administered 2014-07-01: 1 mg via INTRAVENOUS
  Filled 2014-07-01: qty 1

## 2014-07-01 MED ORDER — METHYLPREDNISOLONE SODIUM SUCC 125 MG IJ SOLR
125.0000 mg | Freq: Once | INTRAMUSCULAR | Status: AC
  Administered 2014-07-01: 125 mg via INTRAVENOUS
  Filled 2014-07-01: qty 2

## 2014-07-01 MED ORDER — LEVOFLOXACIN 750 MG PO TABS
750.0000 mg | ORAL_TABLET | Freq: Every day | ORAL | Status: DC
  Administered 2014-07-01 – 2014-07-04 (×4): 750 mg via ORAL
  Filled 2014-07-01 (×6): qty 1

## 2014-07-01 MED ORDER — IPRATROPIUM-ALBUTEROL 0.5-2.5 (3) MG/3ML IN SOLN
3.0000 mL | Freq: Three times a day (TID) | RESPIRATORY_TRACT | Status: DC
  Administered 2014-07-02 – 2014-07-04 (×7): 3 mL via RESPIRATORY_TRACT
  Filled 2014-07-01 (×7): qty 3

## 2014-07-01 MED ORDER — MOMETASONE FURO-FORMOTEROL FUM 200-5 MCG/ACT IN AERO
2.0000 | INHALATION_SPRAY | Freq: Two times a day (BID) | RESPIRATORY_TRACT | Status: DC
  Administered 2014-07-01 – 2014-07-04 (×6): 2 via RESPIRATORY_TRACT
  Filled 2014-07-01: qty 8.8

## 2014-07-01 MED ORDER — ONDANSETRON HCL 4 MG/2ML IJ SOLN
4.0000 mg | Freq: Four times a day (QID) | INTRAMUSCULAR | Status: DC | PRN
  Administered 2014-07-01 – 2014-07-02 (×2): 4 mg via INTRAVENOUS
  Filled 2014-07-01 (×2): qty 2

## 2014-07-01 NOTE — ED Notes (Signed)
Reports SOB has been worsening over past  4 days, with leg swelling. Has psoriasis to entire body. Has COPD and is on oxygen 2 L with pulsator. Reports chest has been tight lately.

## 2014-07-01 NOTE — ED Notes (Signed)
resp treatment cont

## 2014-07-01 NOTE — H&P (Signed)
[facility] History and Physical  Wendy [name] SFK:[id number] DOB: [birth year] DOA: 07/01/2014  Referring physician:  PCP: Mia Creek, MD  Specialists:   Chief Complaint: SOB  HPI: Wendy [name] is a 53 y.o. female with PMH of Chronic respiratory Failure on home oxygen, Advanced COPD presented with worsening SOB, DOE, associated with mild productive cough; Pt has h/o tobacco use, and still exposed to second hand smoking. Denies acute chest pain, no nausea, vomiting diarrhea; Pt reports having erythematous lesions for several years which is getting worse more generalized for the last 6 month     Review of Systems: The patient denies anorexia, fever, weight loss,, vision loss, decreased hearing, hoarseness, chest pain, syncope, dyspnea on exertion, peripheral edema, balance deficits, hemoptysis, abdominal pain, melena, hematochezia, severe indigestion/heartburn, hematuria, incontinence, genital sores, muscle weakness, suspicious skin lesions, transient blindness, difficulty walking, depression, unusual weight change, abnormal bleeding, enlarged lymph nodes, angioedema, and breast masses.    Past Medical History  Diagnosis Date  . Bronchitis   . Asthma   . Seasonal allergies   . Poor dentition   . Alcoholism   . COPD (chronic obstructive pulmonary disease)   . Depression   . Oxygen dependent     home oxygen 3L/min   Past Surgical History  Procedure Laterality Date  . Cesarean section      x 2  . Tubal ligation     Social History:  reports that she quit smoking about 2 years ago. Her smoking use included Cigarettes. She has a 40 pack-year smoking history. She has never used smokeless tobacco. She reports that she drinks about 9 ounces of alcohol per week. She reports that she does not use illicit drugs. Home;  where does patient live--home, ALF, SNF? and with whom if at home? Yes;  Can patient participate in ADLs?  Allergies  Allergen Reactions  . Augmentin  [Amoxicillin-Pot Clavulanate] Nausea Only  . Codeine Nausea Only  . Fexofenadine Nausea Only    allegra  . Other     PAIN MEDICATIONS-nausea    Family History  Problem Relation Age of Onset  . Lung cancer Father   . COPD Father     (be sure to complete)  Prior to Admission medications   Medication Sig Start Date End Date Taking? Authorizing Provider  albuterol (PROVENTIL HFA;VENTOLIN HFA) 108 (90 BASE) MCG/ACT inhaler Inhale 1-2 puffs into the lungs every 6 (six) hours as needed for wheezing or shortness of breath.   Yes Historical Provider, MD  albuterol (PROVENTIL) (2.5 MG/3ML) 0.083% nebulizer solution Take 2.5 mg by nebulization every 6 (six) hours as needed for wheezing or shortness of breath.   Yes Historical Provider, MD  ALPRAZolam Prudy Feeler) 1 MG tablet Take 1 mg by mouth 3 (three) times daily.   Yes Historical Provider, MD  Cholecalciferol (VITAMIN D) 2000 UNITS tablet Take 2,000 Units by mouth daily.   Yes Historical Provider, MD  diphenhydrAMINE (BENADRYL) 25 mg capsule Take 25 mg by mouth daily as needed for itching.   Yes Historical Provider, MD  fluticasone (FLONASE) 50 MCG/ACT nasal spray Place 2 sprays into both nostrils daily.   Yes Historical Provider, MD  Fluticasone-Salmeterol (ADVAIR) 500-50 MCG/DOSE AEPB Inhale 1 puff into the lungs 2 (two) times daily.   Yes Historical Provider, MD  predniSONE (DELTASONE) 10 MG tablet Take 10-20 mg by mouth daily as needed (for breathing assistance).   Yes Historical Provider, MD  tiotropium (SPIRIVA) 18 MCG inhalation capsule Place 18 mcg into  inhaler and inhale daily.   Yes Historical Provider, MD  triamcinolone cream (KENALOG) 0.1 % Apply 1 application topically 2 (two) times daily.   Yes Historical Provider, MD   Physical Exam: Filed Vitals:   07/01/14 1645  BP: 139/94  Pulse: 108  Temp:   Resp:      General:  alert  Eyes: eom-i, perrla   ENT: no oral ulcers   Neck: supple   Cardiovascular: s1,s2  rrr  Respiratory: poor ventilation BL  Abdomen: soft, nt,nd   Skin: generalized scaling erythematous rash   Musculoskeletal: mild le edema  Psychiatric: no hallucinations   Neurologic: CN 2-12 intact; motor 5/5 BL  Labs on Admission:  Basic Metabolic Panel:  Recent Labs Lab 07/01/14 1116  NA 140  K 3.7  CL 92*  CO2 33*  GLUCOSE 111*  BUN <3*  CREATININE 0.56  CALCIUM 9.2   Liver Function Tests: No results found for this basename: AST, ALT, ALKPHOS, BILITOT, PROT, ALBUMIN,  in the last 168 hours No results found for this basename: LIPASE, AMYLASE,  in the last 168 hours No results found for this basename: AMMONIA,  in the last 168 hours CBC:  Recent Labs Lab 07/01/14 1116  WBC 5.3  HGB 12.2  HCT 37.5  MCV 95.7  PLT 167   Cardiac Enzymes: No results found for this basename: CKTOTAL, CKMB, CKMBINDEX, TROPONINI,  in the last 168 hours  BNP (last 3 results)  Recent Labs  10/10/13 0805 10/11/13 0149 07/01/14 1116  PROBNP 1584.0* 1117.0* 167.9*   CBG: No results found for this basename: GLUCAP,  in the last 168 hours  Radiological Exams on Admission: Dg Chest 2 View  07/01/2014   CLINICAL DATA:  Shortness of breath, breaking out all over the legs and arms  EXAM: CHEST  2 VIEW  COMPARISON:  10/11/2013  FINDINGS: The lungs are hyperinflated likely secondary to COPD. There is no focal parenchymal opacity, pleural effusion, or pneumothorax. The heart and mediastinal contours are unremarkable.  The osseous structures are unremarkable.  IMPRESSION: No active cardiopulmonary disease.   Electronically Signed   By: Elige Ko   On: 07/01/2014 12:08   Ct Angio Chest Pe W/cm &/or Wo Cm  07/01/2014   CLINICAL DATA:  Right-sided chest pain and shortness of breath. Elevated D-dimer.  EXAM: CT ANGIOGRAPHY CHEST WITH CONTRAST  TECHNIQUE: Multidetector CT imaging of the chest was performed using the standard protocol during bolus administration of intravenous contrast.  Multiplanar CT image reconstructions and MIPs were obtained to evaluate the vascular anatomy.  CONTRAST:  OMNIPAQUE IOHEXOL 350 MG/ML SOLN  COMPARISON:  Chest x-ray dated 07/01/2014 and CT angiogram dated 10/08/2013  FINDINGS: There are no pulmonary emboli, infiltrates, or effusions. There are multiple small calcified granulomas in both lungs. Inflammatory changes in the right lung apex and medially in the right upper lobe have resolved with minimal residual scarring at the right lung apex. There is a stable area of scarring in the superior segment of the left lower lobe.  Heart size and vascularity are normal. No hilar or mediastinal adenopathy. No significant osseous abnormality. There is hepatomegaly with diffuse hepatic steatosis, unchanged.  Review of the MIP images confirms the above findings.  IMPRESSION: No pulmonary emboli or other acute abnormality. Calcified granulomas in both lungs.   Electronically Signed   By: Geanie Cooley M.D.   On: 07/01/2014 15:33    EKG: Independently reviewed.   Assessment/Plan Principal Problem:   SOB (shortness of breath)  Active Problems:   COPD with emphysema  53 y.o. female with PMH of Chronic respiratory Failure on home oxygen, Advanced COPD presented with worsening SOB, DOE, associated with mild productive cough; -admitted with COPD exacerbation   1. COPD exacerbation; h/o tobacco use, and still exposed to second hand smoking  -CT chest: no PE, no clear infiltrates;   -started IV steroids; cont bronchodilators, oxygen, added atx PO;' NiPPV as needed  2. Acute on chronic respiratory failure due to #1; as above; (counceled avoid smoking contacts) 3. Leg edema likely chronic CHF, probable cor pulmonale due to advanced COPD -obtain US r/o DVT (elevated d dimer); no need for diuretics currently  4. Generalized scaling erythematous plaque rash; h/o rash in the past -suspected psoriasis; Pt already has an appointment with dermatology; cont steroids; may  need skin biopsy r/o cutaneous lymphoma   D/w patient, confirmed with her daughter -Pt is DNR (but okay to use NiPPV)  None;  if consultant consulted, please document name and whether formally or informally consulted  Code Status: DNR (must indicate code status--if unknown or must be presumed, indicate so) Family Communication: d/w patient, her daughter  (indicate person spoken with, if applicable, with phone number if by telephone) Disposition Plan: home pend clinical improvement  (indicate anticipated LOS)  Time spent: >35 minutes   Esperanza Sheets [facility] Pager [phone]  If 7PM-7AM, please contact night-coverage [email or web address] Password [facility] 07/01/2014, 4:49 PM

## 2014-07-01 NOTE — ED Notes (Signed)
hospitalist at [facility]

## 2014-07-01 NOTE — ED Notes (Addendum)
Pt ambulated in hallway on Pulse Oximetry while on 3 LPM. Pt O2 level ranged between 89%-90% while ambulating and averaged approx. 140 bpm while ambulating.

## 2014-07-01 NOTE — ED Notes (Signed)
C/o swelling over the last month states he ankles are swelling not as bad today. States she has a rash over her entire body has appointment with derm next week. Very red scaley rash.

## 2014-07-01 NOTE — ED Provider Notes (Signed)
CSN: [id number]     Arrival date & time 07/01/14  1059 History   First MD Initiated Contact with Patient 07/01/14 1115     Chief Complaint  Patient presents with  . Shortness of Breath     (Consider location/radiation/quality/duration/timing/severity/associated sxs/prior Treatment) HPI Comments: Patient with history of COPD, and asthma, presents emergency department with chief complaints of shortness of breath and leg swelling. She is on 2-3 L of home O2. She states that she has become increasingly short of breath over the past 4 days. She reports associated chest tightness, but no radiating symptoms. She denies any fevers, or chills, cough, nausea, vomiting, diarrhea, constipation.  Additionally, she states that she has had worsening rash on her entire body for the past 2 weeks. She has appointment with dermatology next week.  The history is provided by the patient. No language interpreter was used.    Past Medical History  Diagnosis Date  . Bronchitis   . Asthma   . Seasonal allergies   . Poor dentition   . Alcoholism   . COPD (chronic obstructive pulmonary disease)   . Depression   . Oxygen dependent     home oxygen 3L/min   Past Surgical History  Procedure Laterality Date  . Cesarean section      x 2  . Tubal ligation     Family History  Problem Relation Age of Onset  . Lung cancer Father   . COPD Father    History  Substance Use Topics  . Smoking status: Former Smoker -- 1.00 packs/day for 40 years    Types: Cigarettes    Quit date: 12/26/2011  . Smokeless tobacco: Never Used  . Alcohol Use: 9.0 oz/week    15 Cans of beer per week     Comment: 5 cans of beer daily   OB History   Grav Para Term Preterm Abortions TAB SAB Ect Mult Living                 Review of Systems  All other systems reviewed and are negative.     Allergies  Augmentin; Codeine; Fexofenadine; and Other  Home Medications   Prior to Admission medications   Medication Sig Start  Date End Date Taking? Authorizing Provider  albuterol (PROVENTIL HFA;VENTOLIN HFA) 108 (90 BASE) MCG/ACT inhaler Inhale 1-2 puffs into the lungs every 6 (six) hours as needed for wheezing or shortness of breath.   Yes Historical Provider, MD  albuterol (PROVENTIL) (2.5 MG/3ML) 0.083% nebulizer solution Take 2.5 mg by nebulization every 6 (six) hours as needed for wheezing or shortness of breath.   Yes Historical Provider, MD  ALPRAZolam Prudy Feeler) 1 MG tablet Take 1 mg by mouth 3 (three) times daily.   Yes Historical Provider, MD  Cholecalciferol (VITAMIN D) 2000 UNITS tablet Take 2,000 Units by mouth daily.   Yes Historical Provider, MD  diphenhydrAMINE (BENADRYL) 25 mg capsule Take 25 mg by mouth daily as needed for itching.   Yes Historical Provider, MD  fluticasone (FLONASE) 50 MCG/ACT nasal spray Place 2 sprays into both nostrils daily.   Yes Historical Provider, MD  Fluticasone-Salmeterol (ADVAIR) 500-50 MCG/DOSE AEPB Inhale 1 puff into the lungs 2 (two) times daily.   Yes Historical Provider, MD  predniSONE (DELTASONE) 10 MG tablet Take 10-20 mg by mouth daily as needed (for breathing assistance).   Yes Historical Provider, MD  tiotropium (SPIRIVA) 18 MCG inhalation capsule Place 18 mcg into inhaler and inhale daily.   Yes Historical Provider,  MD  triamcinolone cream (KENALOG) 0.1 % Apply 1 application topically 2 (two) times daily.   Yes Historical Provider, MD   BP 135/77  Pulse 110  Temp(Src) 98 F (36.7 C) (Oral)  Resp 24  SpO2 100% Physical Exam  Nursing note and vitals reviewed. Constitutional: She is oriented to person, place, and time. She appears well-developed and well-nourished.  HENT:  Head: Normocephalic and atraumatic.  Eyes: Conjunctivae and EOM are normal. Pupils are equal, round, and reactive to light.  Neck: Normal range of motion. Neck supple.  Cardiovascular: Regular rhythm.  Exam reveals no gallop and no friction rub.   No murmur heard. Tachycardic   Pulmonary/Chest: Effort normal and breath sounds normal. No respiratory distress. She has no wheezes. She has no rales. She exhibits no tenderness.  Distant lung sounds  Abdominal: Soft. She exhibits no distension and no mass. There is no tenderness. There is no rebound and no guarding.  Musculoskeletal: Normal range of motion. She exhibits no edema and no tenderness.  Neurological: She is alert and oriented to person, place, and time.  Skin: Skin is warm and dry.  Diffuse scaling rash covering the majority of patient's body  Psychiatric: She has a normal mood and affect. Her behavior is normal. Judgment and thought content normal.    ED Course  Procedures (including critical care time) Results for orders placed during the hospital encounter of 07/01/14  CBC      Result Value Ref Range   WBC 5.3  4.0 - 10.5 K/uL   RBC 3.92  3.87 - 5.11 MIL/uL   Hemoglobin 12.2  12.0 - 15.0 g/dL   HCT 22.2  97.9 - 89.2 %   MCV 95.7  78.0 - 100.0 fL   MCH 31.1  26.0 - 34.0 pg   MCHC 32.5  30.0 - 36.0 g/dL   RDW 11.9 (*) 41.7 - 40.8 %   Platelets 167  150 - 400 K/uL  BASIC METABOLIC PANEL      Result Value Ref Range   Sodium 140  137 - 147 mEq/L   Potassium 3.7  3.7 - 5.3 mEq/L   Chloride 92 (*) 96 - 112 mEq/L   CO2 33 (*) 19 - 32 mEq/L   Glucose, Bld 111 (*) 70 - 99 mg/dL   BUN <3 (*) 6 - 23 mg/dL   Creatinine, Ser 1.44  0.50 - 1.10 mg/dL   Calcium 9.2  8.4 - 81.8 mg/dL   GFR calc non Af Amer >90  >90 mL/min   GFR calc Af Amer >90  >90 mL/min   Anion gap 15  5 - 15  PRO B NATRIURETIC PEPTIDE      Result Value Ref Range   Pro B Natriuretic peptide (BNP) 167.9 (*) 0 - 125 pg/mL  D-DIMER, QUANTITATIVE      Result Value Ref Range   D-Dimer, Quant 2.30 (*) 0.00 - 0.48 ug/mL-FEU  I-STAT TROPOININ, ED      Result Value Ref Range   Troponin i, poc 0.00  0.00 - 0.08 ng/mL   Comment 3            Dg Chest 2 View  07/01/2014   CLINICAL DATA:  Shortness of breath, breaking out all over the legs  and arms  EXAM: CHEST  2 VIEW  COMPARISON:  10/11/2013  FINDINGS: The lungs are hyperinflated likely secondary to COPD. There is no focal parenchymal opacity, pleural effusion, or pneumothorax. The heart and mediastinal contours are unremarkable.  The osseous structures are unremarkable.  IMPRESSION: No active cardiopulmonary disease.   Electronically Signed   By: Elige Ko   On: 07/01/2014 12:08   Ct Angio Chest Pe W/cm &/or Wo Cm  07/01/2014   CLINICAL DATA:  Right-sided chest pain and shortness of breath. Elevated D-dimer.  EXAM: CT ANGIOGRAPHY CHEST WITH CONTRAST  TECHNIQUE: Multidetector CT imaging of the chest was performed using the standard protocol during bolus administration of intravenous contrast. Multiplanar CT image reconstructions and MIPs were obtained to evaluate the vascular anatomy.  CONTRAST:  OMNIPAQUE IOHEXOL 350 MG/ML SOLN  COMPARISON:  Chest x-ray dated 07/01/2014 and CT angiogram dated 10/08/2013  FINDINGS: There are no pulmonary emboli, infiltrates, or effusions. There are multiple small calcified granulomas in both lungs. Inflammatory changes in the right lung apex and medially in the right upper lobe have resolved with minimal residual scarring at the right lung apex. There is a stable area of scarring in the superior segment of the left lower lobe.  Heart size and vascularity are normal. No hilar or mediastinal adenopathy. No significant osseous abnormality. There is hepatomegaly with diffuse hepatic steatosis, unchanged.  Review of the MIP images confirms the above findings.  IMPRESSION: No pulmonary emboli or other acute abnormality. Calcified granulomas in both lungs.   Electronically Signed   By: Geanie Cooley M.D.   On: 07/01/2014 15:33     Imaging Review Dg Chest 2 View  07/01/2014   CLINICAL DATA:  Shortness of breath, breaking out all over the legs and arms  EXAM: CHEST  2 VIEW  COMPARISON:  10/11/2013  FINDINGS: The lungs are hyperinflated likely secondary to  COPD. There is no focal parenchymal opacity, pleural effusion, or pneumothorax. The heart and mediastinal contours are unremarkable.  The osseous structures are unremarkable.  IMPRESSION: No active cardiopulmonary disease.   Electronically Signed   By: Elige Ko   On: 07/01/2014 12:08     EKG Interpretation None      MDM   Final diagnoses:  COPD exacerbation    Patient with COPD and shortness of breath that has worsened over the past 4 days.  Will check labs and CXR.  Will treat with nebs.  Will reassess.  1:02 PM Patient discussed with Dr. Elesa Massed, who agrees with the plan.  1:40 PM D-dimer is elevated. Will check CT and she to rule out pulmonary embolus. Patient may still require admission for CHF exacerbation if negative.  4:15 PM CT chest is negative for PE.  Patient still feels quite short of breath.  She ambulates and feels lightheaded with O2 sats dropping to 89% despite being on oxygen.     Discussed the patient with pulmonology, who recommends medicine admit.  Discussed the patient with Dr. York Spaniel, who will admit the patient.  Roxy Horseman, PA-C 07/01/14 [phone]

## 2014-07-01 NOTE — ED Provider Notes (Addendum)
Medical screening examination/treatment/procedure(s) were conducted as a shared visit with non-physician practitioner(s) and myself.  I personally evaluated the patient during the encounter.   EKG Interpretation   Date/Time:  Thursday July 01 2014 11:08:56 EDT Ventricular Rate:  117 PR Interval:  126 QRS Duration: 76 QT Interval:  328 QTC Calculation: 457 R Axis:   65 Text Interpretation:  Sinus tachycardia Otherwise normal ECG Confirmed by  Shatonya Passon,  DO, Aniyla Harling ([id number]) on 07/01/2014 2:01:42 PM      Pt is a 53 y.o. F with history of COPD on oxygen who presents emergency department with a COPD exacerbation. She is diminished diffusely. We'll give breathing treatments, steroids. She also has a erythematous, plaque-like rash diffusely. She has been told she has psoriasis. She has a followup in dermatology scheduled. No concern for Trudie Buckler syndrome. No blisters. No fever. No tick bites.    Layla Maw Basheer Molchan, DO 07/01/14 1613  Layla Maw Tonnia Bardin, DO 07/01/14 [phone]

## 2014-07-01 NOTE — Progress Notes (Signed)
Admission note:  Arrival Method: stretcher from ED  Mental Orientation: A & O x 4  Telemetry: Placed on telemetry box 23, CCMD notified  Assessment: See Doc Flowsheets  Skin: dry, flaky, psoriasis lesions covering entire body  IV: right arm, saline locked  Tubes: n/a  Safety Measures: Educated patient on fall prevention, moderate fall risk.  Non-slip socks on  Admission Screening: called Admission nurse, patient on list   Orientation: Patient has been oriented to the unit, staff and to the room.

## 2014-07-01 NOTE — ED Notes (Signed)
returned from ct

## 2014-07-01 NOTE — ED Notes (Signed)
Pt to ct 

## 2014-07-01 NOTE — ED Notes (Signed)
resp therapist called

## 2014-07-02 ENCOUNTER — Encounter (HOSPITAL_COMMUNITY): Payer: Self-pay | Admitting: General Practice

## 2014-07-02 DIAGNOSIS — Z87891 Personal history of nicotine dependence: Secondary | ICD-10-CM | POA: Diagnosis not present

## 2014-07-02 DIAGNOSIS — R0602 Shortness of breath: Secondary | ICD-10-CM | POA: Diagnosis not present

## 2014-07-02 DIAGNOSIS — J441 Chronic obstructive pulmonary disease with (acute) exacerbation: Secondary | ICD-10-CM | POA: Diagnosis present

## 2014-07-02 DIAGNOSIS — L4050 Arthropathic psoriasis, unspecified: Secondary | ICD-10-CM | POA: Diagnosis present

## 2014-07-02 DIAGNOSIS — J9621 Acute and chronic respiratory failure with hypoxia: Secondary | ICD-10-CM | POA: Diagnosis present

## 2014-07-02 DIAGNOSIS — F329 Major depressive disorder, single episode, unspecified: Secondary | ICD-10-CM | POA: Diagnosis present

## 2014-07-02 DIAGNOSIS — I5032 Chronic diastolic (congestive) heart failure: Secondary | ICD-10-CM | POA: Diagnosis present

## 2014-07-02 DIAGNOSIS — Z66 Do not resuscitate: Secondary | ICD-10-CM | POA: Diagnosis present

## 2014-07-02 DIAGNOSIS — R0600 Dyspnea, unspecified: Secondary | ICD-10-CM | POA: Diagnosis present

## 2014-07-02 DIAGNOSIS — Z9981 Dependence on supplemental oxygen: Secondary | ICD-10-CM | POA: Diagnosis not present

## 2014-07-02 DIAGNOSIS — Z79899 Other long term (current) drug therapy: Secondary | ICD-10-CM | POA: Diagnosis not present

## 2014-07-02 LAB — MRSA PCR SCREENING: MRSA BY PCR: POSITIVE — AB

## 2014-07-02 MED ORDER — DIPHENHYDRAMINE HCL 25 MG PO CAPS
25.0000 mg | ORAL_CAPSULE | Freq: Once | ORAL | Status: AC
  Administered 2014-07-02: 25 mg via ORAL

## 2014-07-02 MED ORDER — TRAMADOL HCL 50 MG PO TABS
50.0000 mg | ORAL_TABLET | Freq: Once | ORAL | Status: AC
  Administered 2014-07-02: 50 mg via ORAL
  Filled 2014-07-02: qty 1

## 2014-07-02 MED ORDER — PREDNISONE 50 MG PO TABS
50.0000 mg | ORAL_TABLET | Freq: Every day | ORAL | Status: DC
  Administered 2014-07-03 – 2014-07-04 (×2): 50 mg via ORAL
  Filled 2014-07-02 (×3): qty 1

## 2014-07-02 NOTE — Progress Notes (Addendum)
[facility] PROGRESS NOTE Assessment/Plan: Acute on chronic respiratory failure with hypoxia due to COPD exacerbation - Started on IV solumedrol, antibiotics and inhalers. Improving slowly - cont O2 therapy. - ambulate on 3 l of oxygen.  Chronic diastolic HF: - no pulm HTN seen on previous echo. - Obtain US r/o DVT. - ct angio neagtive for PE. - will need a Sleep study as an outpatinet.   Code Status: DNR  Family Communication: d/w patient, her daughter  Disposition Plan: inpatient   Consultants:  none  Procedures:  CXR  Antibiotics:  levaquin  HPI/Subjective: No compalins, she relates she is improving.  Objective: Filed Vitals:   07/01/14 1818 07/01/14 1953 07/01/14 2032 07/02/14 0500  BP: 147/92  141/77 128/74  Pulse: 102  93 97  Temp: 98.4 F (36.9 C)  97.5 F (36.4 C) 98.2 F (36.8 C)  TempSrc: Oral  Oral Oral  Resp:   22 18  Height:      Weight:      SpO2: 96% 96% 100% 98%    Intake/Output Summary (Last 24 hours) at 07/02/14 1107 Last data filed at 07/01/14 1700  Gross per 24 hour  Intake    240 ml  Output      0 ml  Net    240 ml   Filed Weights   07/01/14 1751  Weight: 57 kg (125 lb 10.6 oz)    Exam:  General: Alert, awake, oriented x3, in no acute distress.  HEENT: No bruits, no goiter.  Heart: Regular rate and rhythm, without murmurs, rubs, gallops.  Lungs: modair movement clear Abdomen: Soft, nontender, nondistended, positive bowel sounds.     Data Reviewed: Basic Metabolic Panel:  Recent Labs Lab 07/01/14 1116  NA 140  K 3.7  CL 92*  CO2 33*  GLUCOSE 111*  BUN <3*  CREATININE 0.56  CALCIUM 9.2   Liver Function Tests: No results found for this basename: AST, ALT, ALKPHOS, BILITOT, PROT, ALBUMIN,  in the last 168 hours No results found for this basename: LIPASE, AMYLASE,  in the last 168 hours No results found for this basename: AMMONIA,  in the last 168 hours CBC:  Recent Labs Lab 07/01/14 1116  WBC  5.3  HGB 12.2  HCT 37.5  MCV 95.7  PLT 167   Cardiac Enzymes: No results found for this basename: CKTOTAL, CKMB, CKMBINDEX, TROPONINI,  in the last 168 hours BNP (last 3 results)  Recent Labs  10/10/13 0805 10/11/13 0149 07/01/14 1116  PROBNP 1584.0* 1117.0* 167.9*   CBG: No results found for this basename: GLUCAP,  in the last 168 hours  No results found for this or any previous visit (from the past 240 hour(s)).   Studies: Dg Chest 2 View  07/01/2014   CLINICAL DATA:  Shortness of breath, breaking out all over the legs and arms  EXAM: CHEST  2 VIEW  COMPARISON:  10/11/2013  FINDINGS: The lungs are hyperinflated likely secondary to COPD. There is no focal parenchymal opacity, pleural effusion, or pneumothorax. The heart and mediastinal contours are unremarkable.  The osseous structures are unremarkable.  IMPRESSION: No active cardiopulmonary disease.   Electronically Signed   By: Elige Ko   On: 07/01/2014 12:08   Ct Angio Chest Pe W/cm &/or Wo Cm  07/01/2014   CLINICAL DATA:  Right-sided chest pain and shortness of breath. Elevated D-dimer.  EXAM: CT ANGIOGRAPHY CHEST WITH CONTRAST  TECHNIQUE: Multidetector CT imaging of the chest was performed using the standard protocol  during bolus administration of intravenous contrast. Multiplanar CT image reconstructions and MIPs were obtained to evaluate the vascular anatomy.  CONTRAST:  OMNIPAQUE IOHEXOL 350 MG/ML SOLN  COMPARISON:  Chest x-ray dated 07/01/2014 and CT angiogram dated 10/08/2013  FINDINGS: There are no pulmonary emboli, infiltrates, or effusions. There are multiple small calcified granulomas in both lungs. Inflammatory changes in the right lung apex and medially in the right upper lobe have resolved with minimal residual scarring at the right lung apex. There is a stable area of scarring in the superior segment of the left lower lobe.  Heart size and vascularity are normal. No hilar or mediastinal adenopathy. No  significant osseous abnormality. There is hepatomegaly with diffuse hepatic steatosis, unchanged.  Review of the MIP images confirms the above findings.  IMPRESSION: No pulmonary emboli or other acute abnormality. Calcified granulomas in both lungs.   Electronically Signed   By: Geanie Cooley M.D.   On: 07/01/2014 15:33    Scheduled Meds: . ALPRAZolam  1 mg Oral TID  . cholecalciferol  2,000 Units Oral Daily  . enoxaparin (LOVENOX) injection  40 mg Subcutaneous Q24H  . ipratropium-albuterol  3 mL Nebulization TID  . levofloxacin  750 mg Oral Q breakfast  . methylPREDNISolone (SOLU-MEDROL) injection  80 mg Intravenous Q12H  . mometasone-formoterol  2 puff Inhalation BID  . sodium chloride  3 mL Intravenous Q12H  . triamcinolone cream  1 application Topical BID   Continuous Infusions:    Marinda Elk  [facility] Pager [phone]. If 8PM-8AM, please contact night-coverage at [email or web address], password [facility] 07/02/2014, 11:07 AM  LOS: 1 day

## 2014-07-02 NOTE — Progress Notes (Signed)
UR completed 

## 2014-07-03 DIAGNOSIS — R0602 Shortness of breath: Secondary | ICD-10-CM

## 2014-07-03 LAB — TROPONIN I
Troponin I: <0.3 ng/mL (ref <0.30)
Troponin I: <0.3 ng/mL (ref <0.30)

## 2014-07-03 LAB — MAGNESIUM: Magnesium: 1.8 mg/dL (ref 1.5–2.5)

## 2014-07-03 MED ORDER — ALPRAZOLAM 0.5 MG PO TABS
0.5000 mg | ORAL_TABLET | Freq: Every evening | ORAL | Status: DC | PRN
  Administered 2014-07-03: 0.5 mg via ORAL
  Filled 2014-07-03: qty 1

## 2014-07-03 MED ORDER — MORPHINE SULFATE 2 MG/ML IJ SOLN
1.0000 mg | Freq: Once | INTRAMUSCULAR | Status: AC
  Administered 2014-07-03: 1 mg via INTRAVENOUS
  Filled 2014-07-03: qty 1

## 2014-07-03 MED ORDER — LORAZEPAM 1 MG PO TABS: 1.0000 mg | ORAL_TABLET | Freq: Every day | ORAL | Status: DC

## 2014-07-03 MED ORDER — HYDROXYZINE HCL 25 MG PO TABS
25.0000 mg | ORAL_TABLET | Freq: Three times a day (TID) | ORAL | Status: DC | PRN
  Administered 2014-07-03: 25 mg via ORAL
  Filled 2014-07-03: qty 1

## 2014-07-03 NOTE — Progress Notes (Signed)
[facility] progress note. Chief complaint. Chest pain. History of present illness. This 53 year old female in hospital with acute on chronic respiratory failure to 2 COPD exacerbation. She has chronic diastolic heart failure. She complained of chest pain under the left breast to nursing. A 12-lead EKG was obtained and this shows normal sinus rhythm without indication of acute ischemia. Cannot to see the patient at bedside. I ordered 1 mg of IV morphine and the patient indicates that she is now chest pain-free. There is no prior history listed of coronary artery disease. Vital signs. Temperature 98.6, pulse 99, respiration 20, blood pressure 176/63. O2 sats 98%. General appearance. Frail appearing middle-aged female who is alert and in no distress. Cardiac. Rate and rhythm regular. Lungs. Breath sounds are clear but reduced in all fields. No distress and stable O2 sats. Abdomen. Soft with positive bowel sounds. No pain with palpation. Impression/plan. Problem #1. Chest pain. Suspect this is more likely musculoskeletal than cardiac in origin. Nonetheless we'll check troponins now and then every 6 hours for a total of 3 sets. Ordered repeat EKG later this morning to evaluate for any possible changes.

## 2014-07-03 NOTE — Progress Notes (Signed)
[facility] PROGRESS NOTE Assessment/Plan: Acute on chronic respiratory failure with hypoxia due to COPD exacerbation: - Cont prednisone, antibiotics and inhalers. SOB unchanged. - Cont O2 therapy. - EKG negative, cardiac marker negative.  Chronic diastolic HF: - No pulm HTN seen on previous echo. - Obtain US r/o DVT. - Ct angio neagtive for PE. - will need a Sleep study as an outpatinet.   Code Status: DNR  Family Communication: d/w patient, her daughter  Disposition Plan: inpatient   Consultants:  none  Procedures:  CXR  Antibiotics:  levaquin  HPI/Subjective: She relates her SOB is not improve.  Objective: Filed Vitals:   07/02/14 2245 07/03/14 0622 07/03/14 0909 07/03/14 1000  BP:  155/79  155/71  Pulse: 99 91  92  Temp:  98.1 F (36.7 C)  98.1 F (36.7 C)  TempSrc:  Oral  Oral  Resp: 20 18  18   Height:      Weight:      SpO2: 98% 100% 100% 100%    Intake/Output Summary (Last 24 hours) at 07/03/14 1254 Last data filed at 07/03/14 1000  Gross per 24 hour  Intake    360 ml  Output      0 ml  Net    360 ml   Filed Weights   07/01/14 1751 07/02/14 2225  Weight: 57 kg (125 lb 10.6 oz) 58.3 kg (128 lb 8.5 oz)    Exam:  General: Alert, awake, oriented x3, in no acute distress.  HEENT: No bruits, no goiter.  Heart: Regular rate and rhythm, without murmurs, rubs, gallops.  Lungs: mod air movement, clear Abdomen: Soft, nontender, nondistended, positive bowel sounds.     Data Reviewed: Basic Metabolic Panel:  Recent Labs Lab 07/01/14 1116  NA 140  K 3.7  CL 92*  CO2 33*  GLUCOSE 111*  BUN <3*  CREATININE 0.56  CALCIUM 9.2   Liver Function Tests: No results found for this basename: AST, ALT, ALKPHOS, BILITOT, PROT, ALBUMIN,  in the last 168 hours No results found for this basename: LIPASE, AMYLASE,  in the last 168 hours No results found for this basename: AMMONIA,  in the last 168 hours CBC:  Recent Labs Lab  07/01/14 1116  WBC 5.3  HGB 12.2  HCT 37.5  MCV 95.7  PLT 167   Cardiac Enzymes:  Recent Labs Lab 07/03/14 0522 07/03/14 0713  TROPONINI <0.30 <0.30   BNP (last 3 results)  Recent Labs  10/10/13 0805 10/11/13 0149 07/01/14 1116  PROBNP 1584.0* 1117.0* 167.9*   CBG: No results found for this basename: GLUCAP,  in the last 168 hours  Recent Results (from the past 240 hour(s))  MRSA PCR SCREENING     Status: Abnormal   Collection Time    07/02/14 10:10 AM      Result Value Ref Range Status   MRSA by PCR POSITIVE (*) NEGATIVE Final   Comment:            The GeneXpert MRSA Assay (FDA     approved for NASAL specimens     only), is one component of a     comprehensive MRSA colonization     surveillance program. It is not     intended to diagnose MRSA     infection nor to guide or     monitor treatment for     MRSA infections.     RESULT CALLED TO, READ BACK BY AND VERIFIED WITH:     07/04/14 RN  12:45 07/02/14 ([email or web address])     Studies: Ct Angio Chest Pe W/cm &/or Wo Cm  07/01/2014   CLINICAL DATA:  Right-sided chest pain and shortness of breath. Elevated D-dimer.  EXAM: CT ANGIOGRAPHY CHEST WITH CONTRAST  TECHNIQUE: Multidetector CT imaging of the chest was performed using the standard protocol during bolus administration of intravenous contrast. Multiplanar CT image reconstructions and MIPs were obtained to evaluate the vascular anatomy.  CONTRAST:  OMNIPAQUE IOHEXOL 350 MG/ML SOLN  COMPARISON:  Chest x-ray dated 07/01/2014 and CT angiogram dated 10/08/2013  FINDINGS: There are no pulmonary emboli, infiltrates, or effusions. There are multiple small calcified granulomas in both lungs. Inflammatory changes in the right lung apex and medially in the right upper lobe have resolved with minimal residual scarring at the right lung apex. There is a stable area of scarring in the superior segment of the left lower lobe.  Heart size and vascularity are normal. No hilar or  mediastinal adenopathy. No significant osseous abnormality. There is hepatomegaly with diffuse hepatic steatosis, unchanged.  Review of the MIP images confirms the above findings.  IMPRESSION: No pulmonary emboli or other acute abnormality. Calcified granulomas in both lungs.   Electronically Signed   By: Geanie Cooley M.D.   On: 07/01/2014 15:33    Scheduled Meds: . ALPRAZolam  1 mg Oral TID  . cholecalciferol  2,000 Units Oral Daily  . enoxaparin (LOVENOX) injection  40 mg Subcutaneous Q24H  . ipratropium-albuterol  3 mL Nebulization TID  . levofloxacin  750 mg Oral Q breakfast  . mometasone-formoterol  2 puff Inhalation BID  . predniSONE  50 mg Oral Q breakfast  . sodium chloride  3 mL Intravenous Q12H  . triamcinolone cream  1 application Topical BID   Continuous Infusions:    Marinda Elk  [facility] Pager [phone]. If 8PM-8AM, please contact night-coverage at [email or web address], password [facility] 07/03/2014, 12:54 PM  LOS: 2 days

## 2014-07-03 NOTE — Progress Notes (Signed)
VASCULAR LAB PRELIMINARY  PRELIMINARY  PRELIMINARY  PRELIMINARY  Bilateral lower extremity venous Dopplers completed.    Preliminary report:  There is no DVT or SVT noted in the bilateral lower extremities.   Quandra Fedorchak, RVT 07/03/2014, 6:09 PM

## 2014-07-03 NOTE — Progress Notes (Signed)
Pt alert and responsive with staff. Noted 8 beats run of v tach. Pt asymptomatic. Notified on call, NP. New orders received.

## 2014-07-04 MED ORDER — PREDNISONE 10 MG PO TABS
ORAL_TABLET | ORAL | Status: DC
Start: 2014-07-04 — End: 2014-07-15

## 2014-07-04 MED ORDER — LEVOFLOXACIN 750 MG PO TABS: 750.0000 mg | ORAL_TABLET | Freq: Every day | ORAL | Status: DC

## 2014-07-04 MED ORDER — HYDROXYZINE HCL 25 MG PO TABS: 25.0000 mg | ORAL_TABLET | Freq: Three times a day (TID) | ORAL | Status: DC | PRN

## 2014-07-04 NOTE — Progress Notes (Signed)
Discussed discharge instructions and medications with patient.  Prescriptions given.  All questions answered.

## 2014-07-04 NOTE — Discharge Summary (Addendum)
Physician Discharge Summary  Chava [facility] [place] AJO:[id number] DOB: 30-Mar-1961 DOA: 07/01/2014  PCP: Grayce Sessions, NP  Admit date: 07/01/2014 Discharge date: 07/04/2014  Time spent: 35 minutes  Recommendations for Outpatient Follow-up:  1. Follow up with Dermatology next week. 2. Pulmonary in 2 weeks.  Discharge Diagnoses:  Principal Problem:   COPD with emphysema Active Problems:   COPD exacerbation   Acute on chronic respiratory failure   Chronic diastolic heart failure   Discharge Condition: stable  Diet recommendation: heart healthy  Filed Weights   07/01/14 1751 07/02/14 2225 07/03/14 2251  Weight: 57 kg (125 lb 10.6 oz) 58.3 kg (128 lb 8.5 oz) 58.7 kg (129 lb 6.6 oz)    History of present illness:  53 y.o. female with PMH of Chronic respiratory Failure on home oxygen, Advanced COPD presented with worsening SOB, DOE, associated with mild productive cough; Pt has h/o tobacco use, and still exposed to second hand smoking. Denies acute chest pain, no nausea, vomiting diarrhea; Pt reports having erythematous lesions for several years which is getting worse more generalized for the last 6 month    Hospital Course:  Acute on chronic respiratory failure with hypoxia due to COPD exacerbation:  - started on IV solumedrol and and IV antibiotics, with slow improvement in her breathing - Change prednisone, antibiotics and inhalers. She will cont steroid tapared, then cont her home dose. - Cont O2 therapy.  - EKG negative, cardiac marker negative.   Chronic diastolic HF:  - No pulm HTN seen on previous echo.  - doppler of lower ext No evidence of deep vein or superficial thrombosis involving the right lower extremity and left lower extremity - Ct angio neagtive for PE.  - will need a Sleep study as an outpatinet.  Psoriatic Arthirtis: - Has a follow up with dermatology  On 10.19.2015. - Will probably need to be referred to rheumatology.  Procedures:  Ct  angio  Doppler lower ext  Consultations:  none  Discharge Exam: Filed Vitals:   07/04/14 0926  BP: 136/74  Pulse: 93  Temp: 98 F (36.7 C)  Resp: 20    General: A&IO x3 Cardiovascular: RRR Respiratory: good air movement CTA B/L  Discharge Instructions You were cared for by a hospitalist during your hospital stay. If you have any questions about your discharge medications or the care you received while you were in the hospital after you are discharged, you can call the unit and asked to speak with the hospitalist on call if the hospitalist that took care of you is not available. Once you are discharged, your primary care physician will handle any further medical issues. Please note that NO REFILLS for any discharge medications will be authorized once you are discharged, as it is imperative that you return to your primary care physician (or establish a relationship with a primary care physician if you do not have one) for your aftercare needs so that they can reassess your need for medications and monitor your lab values.  Discharge Instructions   Diet - low sodium heart healthy    Complete by:  As directed      Increase activity slowly    Complete by:  As directed           Current Discharge Medication List    START taking these medications   Details  hydrOXYzine (ATARAX/VISTARIL) 25 MG tablet Take 1 tablet (25 mg total) by mouth 3 (three) times daily as needed for itching. Qty: 30 tablet, Refills:  0    levofloxacin (LEVAQUIN) 750 MG tablet Take 1 tablet (750 mg total) by mouth daily with breakfast. Qty: 3 tablet, Refills: 0    !! predniSONE (DELTASONE) 10 MG tablet Takes 6 tablets for 1 days, then 5 tablets for 1 days, then 4 tablets for 1 days, then 3 tablets for 1 days, then 2 tabs for 1 days, then 1 tab for 1 days, and then stop. Qty: 21 tablet, Refills: 0     !! - Potential duplicate medications found. Please discuss with provider.    CONTINUE these medications  which have NOT CHANGED   Details  albuterol (PROVENTIL HFA;VENTOLIN HFA) 108 (90 BASE) MCG/ACT inhaler Inhale 1-2 puffs into the lungs every 6 (six) hours as needed for wheezing or shortness of breath.    albuterol (PROVENTIL) (2.5 MG/3ML) 0.083% nebulizer solution Take 2.5 mg by nebulization every 6 (six) hours as needed for wheezing or shortness of breath.    ALPRAZolam (XANAX) 1 MG tablet Take 1 mg by mouth 3 (three) times daily.    Cholecalciferol (VITAMIN D) 2000 UNITS tablet Take 2,000 Units by mouth daily.    diphenhydrAMINE (BENADRYL) 25 mg capsule Take 25 mg by mouth daily as needed for itching.    fluticasone (FLONASE) 50 MCG/ACT nasal spray Place 2 sprays into both nostrils daily.    Fluticasone-Salmeterol (ADVAIR) 500-50 MCG/DOSE AEPB Inhale 1 puff into the lungs 2 (two) times daily.    !! predniSONE (DELTASONE) 10 MG tablet Take 10-20 mg by mouth daily as needed (for breathing assistance).    tiotropium (SPIRIVA) 18 MCG inhalation capsule Place 18 mcg into inhaler and inhale daily.    triamcinolone cream (KENALOG) 0.1 % Apply 1 application topically 2 (two) times daily.     !! - Potential duplicate medications found. Please discuss with provider.     Allergies  Allergen Reactions  . Augmentin [Amoxicillin-Pot Clavulanate] Nausea Only  . Codeine Nausea Only  . Fexofenadine Nausea Only    allegra  . Other     PAIN MEDICATIONS-nausea   Follow-up Information   Follow up with EDWARDS, MICHELLE P, NP In 2 months. (hospital follow up)    Specialty:  Internal Medicine   Contact information:   [address] [place] [address] [phone]       Follow up with Waymon Budge, MD In 2 weeks. (hospital follow up)    Specialty:  Pulmonary Disease   Contact information:   [address]  [facility] [place] [address] [phone]        The results of significant diagnostics from this hospitalization (including imaging, microbiology, ancillary  and laboratory) are listed below for reference.    Significant Diagnostic Studies: Dg Chest 2 View  07/01/2014   CLINICAL DATA:  Shortness of breath, breaking out all over the legs and arms  EXAM: CHEST  2 VIEW  COMPARISON:  10/11/2013  FINDINGS: The lungs are hyperinflated likely secondary to COPD. There is no focal parenchymal opacity, pleural effusion, or pneumothorax. The heart and mediastinal contours are unremarkable.  The osseous structures are unremarkable.  IMPRESSION: No active cardiopulmonary disease.   Electronically Signed   By: Elige Ko   On: 07/01/2014 12:08   Ct Angio Chest Pe W/cm &/or Wo Cm  07/01/2014   CLINICAL DATA:  Right-sided chest pain and shortness of breath. Elevated D-dimer.  EXAM: CT ANGIOGRAPHY CHEST WITH CONTRAST  TECHNIQUE: Multidetector CT imaging of the chest was performed using the standard protocol during bolus  administration of intravenous contrast. Multiplanar CT image reconstructions and MIPs were obtained to evaluate the vascular anatomy.  CONTRAST:  OMNIPAQUE IOHEXOL 350 MG/ML SOLN  COMPARISON:  Chest x-ray dated 07/01/2014 and CT angiogram dated 10/08/2013  FINDINGS: There are no pulmonary emboli, infiltrates, or effusions. There are multiple small calcified granulomas in both lungs. Inflammatory changes in the right lung apex and medially in the right upper lobe have resolved with minimal residual scarring at the right lung apex. There is a stable area of scarring in the superior segment of the left lower lobe.  Heart size and vascularity are normal. No hilar or mediastinal adenopathy. No significant osseous abnormality. There is hepatomegaly with diffuse hepatic steatosis, unchanged.  Review of the MIP images confirms the above findings.  IMPRESSION: No pulmonary emboli or other acute abnormality. Calcified granulomas in both lungs.   Electronically Signed   By: Geanie Cooley M.D.   On: 07/01/2014 15:33    Microbiology: Recent Results (from the past  240 hour(s))  MRSA PCR SCREENING     Status: Abnormal   Collection Time    07/02/14 10:10 AM      Result Value Ref Range Status   MRSA by PCR POSITIVE (*) NEGATIVE Final   Comment:            The GeneXpert MRSA Assay (FDA     approved for NASAL specimens     only), is one component of a     comprehensive MRSA colonization     surveillance program. It is not     intended to diagnose MRSA     infection nor to guide or     monitor treatment for     MRSA infections.     RESULT CALLED TO, READ BACK BY AND VERIFIED WITH:     ACyril Mourning RN 12:45 07/02/14 ([email or web address])     Labs: Basic Metabolic Panel:  Recent Labs Lab 07/01/14 1116 07/03/14 2229  NA 140  --   K 3.7  --   CL 92*  --   CO2 33*  --   GLUCOSE 111*  --   BUN <3*  --   CREATININE 0.56  --   CALCIUM 9.2  --   MG  --  1.8   Liver Function Tests: No results found for this basename: AST, ALT, ALKPHOS, BILITOT, PROT, ALBUMIN,  in the last 168 hours No results found for this basename: LIPASE, AMYLASE,  in the last 168 hours No results found for this basename: AMMONIA,  in the last 168 hours CBC:  Recent Labs Lab 07/01/14 1116  WBC 5.3  HGB 12.2  HCT 37.5  MCV 95.7  PLT 167   Cardiac Enzymes:  Recent Labs Lab 07/03/14 0522 07/03/14 0713 07/03/14 1450  TROPONINI <0.30 <0.30 <0.30   BNP: BNP (last 3 results)  Recent Labs  10/10/13 0805 10/11/13 0149 07/01/14 1116  PROBNP 1584.0* 1117.0* 167.9*   CBG: No results found for this basename: GLUCAP,  in the last 168 hours     Signed:  Marinda Elk  [facility] 07/04/2014, 10:29 AM

## 2014-07-08 ENCOUNTER — Other Ambulatory Visit: Payer: Self-pay | Admitting: Internal Medicine

## 2014-07-08 ENCOUNTER — Telehealth: Payer: Self-pay | Admitting: Internal Medicine

## 2014-07-08 MED ORDER — TIOTROPIUM BROMIDE MONOHYDRATE 18 MCG IN CAPS: 18.0000 ug | ORAL_CAPSULE | Freq: Every day | RESPIRATORY_TRACT | Status: DC

## 2014-07-08 NOTE — Telephone Encounter (Signed)
Ok to renew Xanax

## 2014-07-08 NOTE — Telephone Encounter (Signed)
Please advise if okay to refill. Thanks.  

## 2014-07-08 NOTE — Telephone Encounter (Signed)
Ok to refill 

## 2014-07-08 NOTE — Telephone Encounter (Signed)
Called and spoke with pt and she is aware of her xanax that has been called to the pharmacy.  She also wanted to let him know that she is having issues with her legs swelling that started last week when she was having her flare up. Pt did know if CY wanted to change any of her medications.  Please advise. thanks

## 2014-07-08 NOTE — Telephone Encounter (Signed)
Called and spoke with pt and she stated that she had a flare of her COPD and was in the hospital from Thursday to Sunday.  She stated that she broke out in a rash and has seen dermatology for this and they told her she has psoriasis and they want to start her on meds that will have to be monitored.  She wanted CY to know this.    Pt has a pending appt with CY on 10/29 and is requesting that the alprazolam be called in for a refill.  spiriva has already been sent to the pharmacy.  CY please advise if ok to refill the xanax.  Thanks  Allergies  Allergen Reactions  . Augmentin [Amoxicillin-Pot Clavulanate] Nausea Only  . Codeine Nausea Only  . Fexofenadine Nausea Only    allegra  . Other     PAIN MEDICATIONS-nausea   Current Outpatient Prescriptions on File Prior to Visit  Medication Sig Dispense Refill  . albuterol (PROVENTIL HFA;VENTOLIN HFA) 108 (90 BASE) MCG/ACT inhaler Inhale 1-2 puffs into the lungs every 6 (six) hours as needed for wheezing or shortness of breath.      Marland Kitchen albuterol (PROVENTIL) (2.5 MG/3ML) 0.083% nebulizer solution Take 2.5 mg by nebulization every 6 (six) hours as needed for wheezing or shortness of breath.      . ALPRAZolam (XANAX) 1 MG tablet Take 1 mg by mouth 3 (three) times daily.      . Cholecalciferol (VITAMIN D) 2000 UNITS tablet Take 2,000 Units by mouth daily.      . diphenhydrAMINE (BENADRYL) 25 mg capsule Take 25 mg by mouth daily as needed for itching.      . fluticasone (FLONASE) 50 MCG/ACT nasal spray Place 2 sprays into both nostrils daily.      . Fluticasone-Salmeterol (ADVAIR) 500-50 MCG/DOSE AEPB Inhale 1 puff into the lungs 2 (two) times daily.      . hydrOXYzine (ATARAX/VISTARIL) 25 MG tablet Take 1 tablet (25 mg total) by mouth 3 (three) times daily as needed for itching.  30 tablet  0  . levofloxacin (LEVAQUIN) 750 MG tablet Take 1 tablet (750 mg total) by mouth daily with breakfast.  3 tablet  0  . predniSONE (DELTASONE) 10 MG tablet Take 10-20 mg  by mouth daily as needed (for breathing assistance).      . predniSONE (DELTASONE) 10 MG tablet Takes 6 tablets for 1 days, then 5 tablets for 1 days, then 4 tablets for 1 days, then 3 tablets for 1 days, then 2 tabs for 1 days, then 1 tab for 1 days, and then stop.  21 tablet  0  . triamcinolone cream (KENALOG) 0.1 % Apply 1 application topically 2 (two) times daily.       No current facility-administered medications on file prior to visit.

## 2014-07-09 MED ORDER — FUROSEMIDE 20 MG PO TABS: ORAL_TABLET | ORAL | Status: DC

## 2014-07-09 NOTE — Telephone Encounter (Signed)
Spoke with the pt and notified of recs per CDY  She verbalized understanding  Rx was sent to pharm  

## 2014-07-09 NOTE — Telephone Encounter (Signed)
Offer lasix 20 mg, # 10, 1 every other day as needed

## 2014-07-15 ENCOUNTER — Encounter: Payer: Self-pay | Admitting: Internal Medicine

## 2014-07-15 ENCOUNTER — Ambulatory Visit (INDEPENDENT_AMBULATORY_CARE_PROVIDER_SITE_OTHER): Payer: Medicare Other | Admitting: Internal Medicine

## 2014-07-15 VITALS — BP 150/80 | HR 94 | Ht 61.0 in | Wt 143.8 lb

## 2014-07-15 DIAGNOSIS — J9621 Acute and chronic respiratory failure with hypoxia: Secondary | ICD-10-CM

## 2014-07-15 DIAGNOSIS — J449 Chronic obstructive pulmonary disease, unspecified: Secondary | ICD-10-CM

## 2014-07-15 DIAGNOSIS — F4323 Adjustment disorder with mixed anxiety and depressed mood: Secondary | ICD-10-CM

## 2014-07-15 DIAGNOSIS — J4489 Other specified chronic obstructive pulmonary disease: Secondary | ICD-10-CM

## 2014-07-15 DIAGNOSIS — L409 Psoriasis, unspecified: Secondary | ICD-10-CM

## 2014-07-15 DIAGNOSIS — Z23 Encounter for immunization: Secondary | ICD-10-CM

## 2014-07-15 MED ORDER — ALBUTEROL SULFATE HFA 108 (90 BASE) MCG/ACT IN AERS: INHALATION_SPRAY | RESPIRATORY_TRACT | Status: DC

## 2014-07-15 MED ORDER — ALPRAZOLAM 1 MG PO TABS: ORAL_TABLET | ORAL | Status: DC

## 2014-07-15 NOTE — Progress Notes (Signed)
Patient ID: Wendy [name], female    DOB: Jul 27, 1961, 53 y.o.   MRN: [id number] HPI 03/15/11- 34 yoF smoker followed for COPD, tobacco cessation support, rhinitis, complicated by hx anxiety Last here November 06, 2010- note reviewed. She has not smoked since December 2011.  Using her oxygen for the past week since she caught a cold with head and chest congestion, purulent sputum, hoarse. No fever or sore throat. Using her regular meds. Using her nebulizer regularly.  Had 7 teeth pulled in May.   08/16/11- 50 yoF smoker followed for COPD, tobacco cessation support, rhinitis, complicated by hx anxiety Has had flu vaccine. October 28 she [facility] with acute bronchitis after catching a cold. Got better after treatment there but not well. We called in prednisone and a Z-Pak. She still feels some chest congestion, cough with clear mucus, feels tight. Denies fever, chest pain or blood. Has had oxygen for several months, used most of the time. Says she quit cigarettes this year by using an electronic cigarette with a mild insert. Nasal congestion especially left side. Occasional blood from left nostril. Chest x-ray: 07/15/2011-hyperinflation, no acute disease. I reviewed images.  02/25/12- 50 yoF smoker followed for COPD, tobacco cessation support, rhinitis, complicated by hx anxiety : c/o sob, congestion, and chest tightness. She is remaining off of cigarettes. Her COPD assessment test (CAT) score is 30/40 She had done well for the last several months as her last visit. Just 3 days ago and acute illness began as a gastroenteritis followed by shortness of breath, nonproductive cough and wheeze. She increased her oxygen. Using her nebulizer. Feels better today.  10/16/12- 51 yoF formersmoker followed for COPD, tobacco cessation support, rhinitis, complicated by hx anxiety follow-up: Pt c/o increased SOB with activity that has gradually been worse since last OV.  Antibiotics 2 or 3  times for bronchitis since last here. Needs flu vaccine. Oxygen 2-3 L/Advanced. Nebulizer helps. Not acute-this is baseline for her.  02/13/13- 52 yoF former smoker followed for COPD, tobacco cessation support, rhinitis, complicated by hx anxiety COPD w/ PFT. Pt reports breathing has been terrible. has a little occasional dry cough, chest tx but no wheezing.  Stays short of breath. Had a Z-Pak 2 weeks ago for bronchitis.still feels tight if she takes a deep breath. Rarely any mucus. No chest pain or ankle edema. O2 2-3L/ Advanced. After discussion, she is not interested in lung transplant. CXR 3/13 /14 IMPRESSION:  Old granulomatous disease and chronic hyperinflation.  No acute abnormalities.  Original Report Authenticated By: Ulyses Southward, M.D. PFT 02/13/2013-very severe obstructive airways disease with insignificant response to bronchodilator, diffusion severely reduced. Patient was unable to perform lung volume test. FVC 1.23/39%, FEV1 0.48/19%, FEV1/FVC 0.39/49%, FEF 25-75% 0.18/7%, DLCO 46%.  03/12/13- 30 yoF former smoker followed for COPD, tobacco cessation support, rhinitis, complicated by hx anxiety FOLLOWS FOR: pt wanted to discuss lung transplant- was told about it on 02-13-13. Wants more information. Remains off cigarettes.   Daughter here Feeling stable with no acute issues. Continuous O2 2.5L/ Advanced. We discussed progression of lung disease, outlined limitations of lung transplant and common criteria. Her disease is severe enough that she may be in her transplant window.  Preliminary discussion of end of life.  08/07/13- 31 yoF former smoker followed for COPD, tobacco cessation support, rhinitis, complicated by hx anxiety FOLLOWS FOR: having hard time with breathing; put on abx and prednisone. Finishing pred and doxy. Feels need to extend course- still  green with cough and chest is tight. Incidental mention of extensive rash- onset 2 years ago. CXR 11/27/12 IMPRESSION:  Old  granulomatous disease and chronic hyperinflation.  No acute abnormalities.  Original Report Authenticated By: Ulyses Southward, M.D.  10/15/13- 24 yoF former smoker followed for COPD, tobacco cessation support, rhinitis, complicated by hx anxiety                Daughter here Post hospital- 1/19-10/24/13- acute on chronic respiratory failure, COPD exacerbation with right upper lobe community pneumonia, diastolic dysfunction.  O2 2-3L/ Advanced Feeling much better. Mild residual dry cough as she tapers prednisone. Not smoking. CT chest 10/08/13 IMPRESSION:  1. No evidence of pulmonary embolus.  2. Focal right upper lobe pneumonia noted.  3. Few scattered small pulmonary nodules seen, measuring up to 5 mm  in size. If the patient is at high risk for bronchogenic carcinoma,  follow-up chest CT at 6-12 months is recommended. If the patient is  at low risk for bronchogenic carcinoma, follow-up chest CT at 12  months is recommended. This recommendation follows the consensus  statement: Guidelines for Management of Small Pulmonary Nodules  Detected on CT Scans: A Statement from the Fleischner Society as  published in Radiology 2005;237:395-400.  Electronically Signed  By: Roanna Raider M.D.  On: 10/08/2013 05:07  12/07/13- 43 yoF former smoker followed for COPD, tobacco cessation support, rhinitis, complicated by hx anxiety                Daughter here FOLLOWS FOR: feels like she is having a flare up and wonders if she is getting a cold as well-if abx given please print for her to take with her to pharmacy. Continues O2 3 L/Advanced, prednisone 10 mg daily maintenance Head and chest congestion/chest cold x3 days. Dry cough, no fever. Requalified for oxygen.  07/15/14-53 yoF former smoker followed for COPD, tobacco cessation support, rhinitis, complicated by hx anxiety, psoriasis                 Follows For:  Pt hospitalized at [facility] on 07/01/14 for copd, swelling and severe psoriasis break out - SOB on  exertion better than it was - Feels like a knife in center of back - Worse with deep breath -Swelling in feet and ankles -  Started on Cyclosporin 4 days ago for psoriasis, Lasix for edema Prednisone taper now down to 5 mg alternating with 10 every other day. Sharp pains in mid back without radiation started yesterday CXR 07/01/14 IMPRESSION:  No pulmonary emboli or other acute abnormality. Calcified granulomas  in both lungs.  Electronically Signed  By: Geanie Cooley M.D.  On: 07/01/2014 15:33  Review of Systems-per HPI Constitutional:   No-   weight loss, night sweats, fevers, chills, fatigue, lassitude. HEENT:   No-  headaches, difficulty swallowing, tooth/dental problems, sore throat,       No-  sneezing, itching, ear ache, +nasal congestion, post nasal drip,  CV:  No-   chest pain, orthopnea, PND, swelling in lower extremities, anasarca,  dizziness, palpitations Resp: +Persistent shortness of breath with exertion or at rest.              No-productive cough,  + non-productive cough,  No- coughing up of blood.           No-change in color of mucus.  No- wheezing.   Skin: +psoriasis rash GI:  No-   heartburn, indigestion, abdominal pain, nausea, vomiting,  GU:  MS:  No-   joint  pain or swelling.   Neuro-     nothing unusual Psych:  No- change in mood or affect. No depression or anxiety.  No memory loss.   Objective:   Physical Exam General- Alert, Oriented, Affect-appropriate, Distress- none acute. O2 2.5L/ 100%, weight gain Skin- +psoriasis plaques on the legs, trunk,  and nail changes. Lymphadenopathy- none Head- atraumatic            Eyes- Gross vision intact, PERRLA, conjunctivae clear secretions            Ears- Hearing, canals-normal            Nose- turbinate edema, + External and Septal dev, mucus, No polyps, erosion,                             Throat- Mallampati II , mucosa clear , drainage- none, tonsils- atrophic Neck- flexible , trachea midline, no stridor , thyroid  nl, carotid no bruit Chest - symmetrical excursion , unlabored           Heart/CV- RRR , no murmur , no gallop  , no rub, nl s1 s2                           - JVD- none , edema- none, stasis changes- none, varices- none           Lung- +very distant, wheeze-none, +dry cough with deep breath , dullness-none,                        rub-none.+ pursed lips                         Chest wall- +tender upper thoracic spine just above strep Abd-  Br/ Gen/ Rectal- Not done, not indicated Extrem- cyanosis- none, clubbing, none, atrophy- none, strength- nl Neuro- grossly intact to observation

## 2014-07-15 NOTE — Patient Instructions (Addendum)
Flu vax     Skip the cyclosporine for 3 days after flu shot, then resume.  Script for [employer] for [employer]  Alternate prednisone 5 mg/ 10 mg every other day. When you feel breathing is stable then try reducing prednisone 5 mg(1/2 x 10 mg) every day

## 2014-07-18 NOTE — Assessment & Plan Note (Signed)
Anxiety with chronic depression Plan-refill Xanax with discussion

## 2014-07-18 NOTE — Assessment & Plan Note (Signed)
Looks really bad. Working with dermatologist I suggested she skips cyclosporine for 3 days after her flu vaccine then started back

## 2014-07-18 NOTE — Assessment & Plan Note (Signed)
Hypoxic respiratory failure secondary to COPD Continue oxygen 2-3 L/Advanced

## 2014-07-18 NOTE — Assessment & Plan Note (Signed)
Plan-continue alternate day prednisone until stable then reduced to 5 mg daily as discussed

## 2014-07-29 ENCOUNTER — Other Ambulatory Visit: Payer: Self-pay | Admitting: Internal Medicine

## 2014-07-29 NOTE — Telephone Encounter (Signed)
CY please advise if you would like to refill the lasix for this pt.  Looks like this was last filled on 10/28 for #10 tablets.  thanks

## 2014-07-29 NOTE — Telephone Encounter (Signed)
Ok to refill 

## 2014-07-30 ENCOUNTER — Telehealth: Payer: Self-pay | Admitting: Internal Medicine

## 2014-07-30 MED ORDER — FUROSEMIDE 20 MG PO TABS: ORAL_TABLET | ORAL | Status: DC

## 2014-07-30 NOTE — Telephone Encounter (Signed)
ATC Line busy 

## 2014-07-30 NOTE — Telephone Encounter (Signed)
Spoke with the pt  She states only has one lasix remaining  Would like to know if CDY would refill this for her  We last gave her # 10 tablets on 07/09/14 with directions to take 1 every other day prn  Please advise thanks!

## 2014-07-30 NOTE — Telephone Encounter (Signed)
Ok to refill. I thought we did that yesterday

## 2014-07-30 NOTE — Telephone Encounter (Signed)
lmtcb for pt. Rx sent to preferred pharmacy.  

## 2014-08-02 NOTE — Telephone Encounter (Signed)
Called and spoke with pt and she is aware of refill sent to the pharmacy.

## 2014-08-06 ENCOUNTER — Other Ambulatory Visit: Payer: Self-pay | Admitting: *Deleted

## 2014-08-06 MED ORDER — TIOTROPIUM BROMIDE MONOHYDRATE 18 MCG IN CAPS: ORAL_CAPSULE | RESPIRATORY_TRACT | Status: DC

## 2014-08-06 MED ORDER — FLUTICASONE PROPIONATE 50 MCG/ACT NA SUSP: 2.0000 | Freq: Every day | NASAL | Status: DC

## 2014-08-14 ENCOUNTER — Other Ambulatory Visit: Payer: Self-pay | Admitting: Internal Medicine

## 2014-08-16 ENCOUNTER — Telehealth: Payer: Self-pay | Admitting: Internal Medicine

## 2014-08-16 MED ORDER — FLUTICASONE-SALMETEROL 500-50 MCG/DOSE IN AEPB: 1.0000 | INHALATION_SPRAY | Freq: Two times a day (BID) | RESPIRATORY_TRACT | Status: DC

## 2014-08-16 NOTE — Telephone Encounter (Signed)
rx has been sent to the pharmacy and i called but the pt was not home.  Will try back later.

## 2014-08-17 NOTE — Telephone Encounter (Signed)
Pt informed that rx was sent to pharmacy.  

## 2014-09-29 ENCOUNTER — Telehealth: Payer: Self-pay | Admitting: Internal Medicine

## 2014-09-29 MED ORDER — AZITHROMYCIN 250 MG PO TABS
ORAL_TABLET | ORAL | Status: DC
Start: 2014-09-29 — End: 2014-11-01

## 2014-09-29 NOTE — Telephone Encounter (Signed)
Pt returning call.Wendy [name] ° °

## 2014-09-29 NOTE — Telephone Encounter (Signed)
Pt c/o head congestion, PND, sore throat and SOB x 2-3 days.  Using Prednisone 20mg  as directed QD.  Taking Advair, Albuterol HFA and Spiriva as directed as well.  Not using any OTC meds.  Please advise Dr . Thanks.   Allergies  Allergen Reactions  . Augmentin [Amoxicillin-Pot Clavulanate] Nausea Only  . Codeine Nausea Only  . Fexofenadine Nausea Only    allegra  . Other     PAIN MEDICATIONS-nausea     Medication List       This list is accurate as of: 09/29/14 11:37 AM.  Always use your most recent med list.               ADVAIR DISKUS 500-50 MCG/DOSE Aepb  Generic drug:  Fluticasone-Salmeterol  INHALE 1 PUFF INTO THE LUNGS 2 TIMES DAILY     albuterol (2.5 MG/3ML) 0.083% nebulizer solution  Commonly known as:  PROVENTIL  Take 2.5 mg by nebulization every 6 (six) hours as needed for wheezing or shortness of breath.     albuterol 108 (90 BASE) MCG/ACT inhaler  Commonly known as:  PROVENTIL HFA;VENTOLIN HFA  Inhale 1-2 puffs into the lungs every 6 (six) hours as needed for wheezing or shortness of breath.     albuterol 108 (90 BASE) MCG/ACT inhaler  Commonly known as:  PROAIR HFA  2 puffs every 4 hours as needed- rescue     ALPRAZolam 1 MG tablet  Commonly known as:  XANAX  1 tab, three times daily as needed     cycloSPORINE 100 MG capsule  Commonly known as:  SANDIMMUNE  Take 100 mg by mouth 2 (two) times daily.     cycloSPORINE 25 MG capsule  Commonly known as:  SANDIMMUNE  Take 2 capsules by mouth 2 times daily     diphenhydrAMINE 25 mg capsule  Commonly known as:  BENADRYL  Take 25 mg by mouth daily as needed for itching.     fluticasone 50 MCG/ACT nasal spray  Commonly known as:  FLONASE  Place 2 sprays into both nostrils daily.     furosemide 20 MG tablet  Commonly known as:  LASIX  1 tablet every other day as needed     hydrOXYzine 25 MG tablet  Commonly known as:  ATARAX/VISTARIL  Take 1 tablet (25 mg total) by mouth 3 (three) times daily  as needed for itching.     predniSONE 10 MG tablet  Commonly known as:  DELTASONE  Take 10-20 mg by mouth daily as needed (for breathing assistance).     tiotropium 18 MCG inhalation capsule  Commonly known as:  SPIRIVA HANDIHALER  PLACE 1 CAPSULE INTO INHALER AND INHALE CONTENTS ONCE DAILY     triamcinolone cream 0.1 %  Commonly known as:  KENALOG  Apply 1 application topically 2 (two) times daily.     Vitamin D 2000 UNITS tablet  Take 2,000 Units by mouth daily.

## 2014-09-29 NOTE — Telephone Encounter (Signed)
ATC multiple times - unable to get through on line (no sound) WCB this afternoon.

## 2014-09-29 NOTE — Telephone Encounter (Signed)
Offer Z pak. Ok to use throat lozenges, Mucinex-DM, etc as needed

## 2014-09-29 NOTE — Telephone Encounter (Signed)
Called and spoke to pt. Informed pt of the recs per CY. Rx sent to preferred pharmacy. Pt verbalized understanding and denied any further questions or concerns at this time.  

## 2014-10-05 ENCOUNTER — Other Ambulatory Visit: Payer: Self-pay | Admitting: Internal Medicine

## 2014-10-05 ENCOUNTER — Telehealth: Payer: Self-pay | Admitting: Internal Medicine

## 2014-10-05 MED ORDER — PREDNISONE 10 MG PO TABS: 10.0000 mg | ORAL_TABLET | Freq: Every day | ORAL | Status: DC | PRN

## 2014-10-05 NOTE — Telephone Encounter (Signed)
RX have been sent to the pharmacy. Nothing further needed

## 2014-10-15 ENCOUNTER — Ambulatory Visit: Payer: Medicare Other | Admitting: Internal Medicine

## 2014-10-22 ENCOUNTER — Telehealth: Payer: Self-pay | Admitting: Internal Medicine

## 2014-10-22 MED ORDER — HYDROCODONE-HOMATROPINE 5-1.5 MG/5ML PO SYRP: 5.0000 mL | ORAL_SOLUTION | Freq: Four times a day (QID) | ORAL | Status: DC | PRN

## 2014-10-22 MED ORDER — LEVOFLOXACIN 500 MG PO TABS: 500.0000 mg | ORAL_TABLET | Freq: Every day | ORAL | Status: DC

## 2014-10-22 NOTE — Telephone Encounter (Signed)
Spoke with pt, was given a zpak on 1/13, states that she is still having sob, prod cough with green/gray mucus.  States the original zpak had helped with her cough but it never resolved.  Pt is requesting a cough syrup and recs.  Pt uses [facility] on [address]  Last ov: 07/15/14 Next ov: 11/01/14   CY please advise.  Thank you!  Allergies  Allergen Reactions  . Augmentin [Amoxicillin-Pot Clavulanate] Nausea Only  . Codeine Nausea Only  . Fexofenadine Nausea Only    allegra  . Other     PAIN MEDICATIONS-nausea   Current Outpatient Prescriptions on File Prior to Visit  Medication Sig Dispense Refill  . ADVAIR DISKUS 500-50 MCG/DOSE AEPB INHALE 1 PUFF INTO THE LUNGS 2 TIMES DAILY 60 each 3  . albuterol (PROAIR HFA) 108 (90 BASE) MCG/ACT inhaler 2 puffs every 4 hours as needed- rescue 1 Inhaler prn  . albuterol (PROVENTIL HFA;VENTOLIN HFA) 108 (90 BASE) MCG/ACT inhaler Inhale 1-2 puffs into the lungs every 6 (six) hours as needed for wheezing or shortness of breath.    Marland Kitchen albuterol (PROVENTIL) (2.5 MG/3ML) 0.083% nebulizer solution Take 2.5 mg by nebulization every 6 (six) hours as needed for wheezing or shortness of breath.    . ALPRAZolam (XANAX) 1 MG tablet 1 tab, three times daily as needed 90 tablet 5  . azithromycin (ZITHROMAX Z-PAK) 250 MG tablet Take as directed. 6 each 0  . Cholecalciferol (VITAMIN D) 2000 UNITS tablet Take 2,000 Units by mouth daily.    . cycloSPORINE (SANDIMMUNE) 100 MG capsule Take 100 mg by mouth 2 (two) times daily.    . cycloSPORINE (SANDIMMUNE) 25 MG capsule Take 2 capsules by mouth 2 times daily    . diphenhydrAMINE (BENADRYL) 25 mg capsule Take 25 mg by mouth daily as needed for itching.    . fluticasone (FLONASE) 50 MCG/ACT nasal spray Place 2 sprays into both nostrils daily. 16 g 11  . furosemide (LASIX) 20 MG tablet 1 tablet every other day as needed 10 tablet 0  . hydrOXYzine (ATARAX/VISTARIL) 25 MG tablet Take 1 tablet (25 mg total) by mouth 3  (three) times daily as needed for itching. 30 tablet 0  . predniSONE (DELTASONE) 10 MG tablet TAKE 1 OR 2 TABLETS DAILY AS NEEDED 60 tablet 3  . tiotropium (SPIRIVA HANDIHALER) 18 MCG inhalation capsule PLACE 1 CAPSULE INTO INHALER AND INHALE CONTENTS ONCE DAILY 30 capsule 11  . triamcinolone cream (KENALOG) 0.1 % Apply 1 application topically 2 (two) times daily.     No current facility-administered medications on file prior to visit.

## 2014-10-22 NOTE — Telephone Encounter (Signed)
Offer hydromet 140 ml,  5 ml every 6 hours if needed for cough           levaquin 500 mg, # 5, 1 daily x 5 days

## 2014-10-22 NOTE — Telephone Encounter (Signed)
Pt aware of recs.  Will pick up hydromet this evening.  Nothing further needed.

## 2014-11-01 ENCOUNTER — Ambulatory Visit (INDEPENDENT_AMBULATORY_CARE_PROVIDER_SITE_OTHER): Payer: Medicare Other | Admitting: Internal Medicine

## 2014-11-01 ENCOUNTER — Encounter: Payer: Self-pay | Admitting: Internal Medicine

## 2014-11-01 ENCOUNTER — Encounter (INDEPENDENT_AMBULATORY_CARE_PROVIDER_SITE_OTHER): Payer: Self-pay

## 2014-11-01 VITALS — BP 130/82 | HR 96 | Ht 61.0 in | Wt 148.8 lb

## 2014-11-01 DIAGNOSIS — F17200 Nicotine dependence, unspecified, uncomplicated: Secondary | ICD-10-CM

## 2014-11-01 DIAGNOSIS — Z720 Tobacco use: Secondary | ICD-10-CM

## 2014-11-01 DIAGNOSIS — J432 Centrilobular emphysema: Secondary | ICD-10-CM

## 2014-11-01 MED ORDER — THEOPHYLLINE ER 200 MG PO TB12: 200.0000 mg | ORAL_TABLET | Freq: Two times a day (BID) | ORAL | Status: DC

## 2014-11-01 NOTE — Progress Notes (Signed)
Patient ID: BLIMY NAPOLEON, female    DOB: April 08, 1961, 54 y.o.   MRN: [id number] HPI 03/15/11- 71 yoF smoker followed for COPD, tobacco cessation support, rhinitis, complicated by hx anxiety Last here November 06, 2010- note reviewed. She has not smoked since December 2011.  Using her oxygen for the past week since she caught a cold with head and chest congestion, purulent sputum, hoarse. No fever or sore throat. Using her regular meds. Using her nebulizer regularly.  Had 7 teeth pulled in May.   08/16/11- 50 yoF smoker followed for COPD, tobacco cessation support, rhinitis, complicated by hx anxiety Has had flu vaccine. October 28 she [facility] to [facility] with acute bronchitis after catching a cold. Got better after treatment there but not well. We called in prednisone and a Z-Pak. She still feels some chest congestion, cough with clear mucus, feels tight. Denies fever, chest pain or blood. Has had oxygen for several months, used most of the time. Says she quit cigarettes this year by using an electronic cigarette with a mild insert. Nasal congestion especially left side. Occasional blood from left nostril. Chest x-ray: 07/15/2011-hyperinflation, no acute disease. I reviewed images.  02/25/12- 50 yoF smoker followed for COPD, tobacco cessation support, rhinitis, complicated by hx anxiety : c/o sob, congestion, and chest tightness. She is remaining off of cigarettes. Her COPD assessment test (CAT) score is 30/40 She had done well for the last several months as her last visit. Just 3 days ago and acute illness began as a gastroenteritis followed by shortness of breath, nonproductive cough and wheeze. She increased her oxygen. Using her nebulizer. Feels better today.  10/16/12- 51 yoF formersmoker followed for COPD, tobacco cessation support, rhinitis, complicated by hx anxiety follow-up: Pt c/o increased SOB with activity that has gradually been worse since last OV.  Antibiotics 2 or 3  times for bronchitis since last here. Needs flu vaccine. Oxygen 2-3 L/Advanced. Nebulizer helps. Not acute-this is baseline for her.  02/13/13- 40 yoF former smoker followed for COPD, tobacco cessation support, rhinitis, complicated by hx anxiety COPD w/ PFT. Pt reports breathing has been terrible. has a little occasional dry cough, chest tx but no wheezing.  Stays short of breath. Had a Z-Pak 2 weeks ago for bronchitis.still feels tight if she takes a deep breath. Rarely any mucus. No chest pain or ankle edema. O2 2-3L/ Advanced. After discussion, she is not interested in lung transplant. CXR 3/13 /14 IMPRESSION:  Old granulomatous disease and chronic hyperinflation.  No acute abnormalities.  Original Report Authenticated By: Ulyses Southward, M.D. PFT 02/13/2013-very severe obstructive airways disease with insignificant response to bronchodilator, diffusion severely reduced. Patient was unable to perform lung volume test. FVC 1.23/39%, FEV1 0.48/19%, FEV1/FVC 0.39/49%, FEF 25-75% 0.18/7%, DLCO 46%.  03/12/13- 5 yoF former smoker followed for COPD, tobacco cessation support, rhinitis, complicated by hx anxiety FOLLOWS FOR: pt wanted to discuss lung transplant- was told about it on 02-13-13. Wants more information. Remains off cigarettes.   Daughter here Feeling stable with no acute issues. Continuous O2 2.5L/ Advanced. We discussed progression of lung disease, outlined limitations of lung transplant and common criteria. Her disease is severe enough that she may be in her transplant window.  Preliminary discussion of end of life.  08/07/13- 15 yoF former smoker followed for COPD, tobacco cessation support, rhinitis, complicated by hx anxiety FOLLOWS FOR: having hard time with breathing; put on abx and prednisone. Finishing pred and doxy. Feels need to extend course- still  green with cough and chest is tight. Incidental mention of extensive rash- onset 2 years ago. CXR 11/27/12 IMPRESSION:  Old  granulomatous disease and chronic hyperinflation.  No acute abnormalities.  Original Report Authenticated By: Ulyses Southward, M.D.  10/15/13- 42 yoF former smoker followed for COPD, tobacco cessation support, rhinitis, complicated by hx anxiety                Daughter here Post hospital- 1/19-10/24/13- acute on chronic respiratory failure, COPD exacerbation with right upper lobe community pneumonia, diastolic dysfunction.  O2 2-3L/ Advanced Feeling much better. Mild residual dry cough as she tapers prednisone. Not smoking. CT chest 10/08/13 IMPRESSION:  1. No evidence of pulmonary embolus.  2. Focal right upper lobe pneumonia noted.  3. Few scattered small pulmonary nodules seen, measuring up to 5 mm  in size. If the patient is at high risk for bronchogenic carcinoma,  follow-up chest CT at 6-12 months is recommended. If the patient is  at low risk for bronchogenic carcinoma, follow-up chest CT at 12  months is recommended. This recommendation follows the consensus  statement: Guidelines for Management of Small Pulmonary Nodules  Detected on CT Scans: A Statement from the Fleischner Society as  published in Radiology 2005;237:395-400.  Electronically Signed  By: Roanna Raider M.D.  On: 10/08/2013 05:07  12/07/13- 83 yoF former smoker followed for COPD, tobacco cessation support, rhinitis, complicated by hx anxiety                Daughter here FOLLOWS FOR: feels like she is having a flare up and wonders if she is getting a cold as well-if abx given please print for her to take with her to pharmacy. Continues O2 3 L/Advanced, prednisone 10 mg daily maintenance Head and chest congestion/chest cold x3 days. Dry cough, no fever. Requalified for oxygen.  07/15/14-53 yoF former smoker followed for COPD, tobacco cessation support, rhinitis, complicated by hx anxiety, psoriasis                 Follows For:  Pt hospitalized at [facility] on 07/01/14 for copd, swelling and severe psoriasis break out - SOB on  exertion better than it was - Feels like a knife in center of back - Worse with deep breath -Swelling in feet and ankles -  Started on Cyclosporin 4 days ago for psoriasis, Lasix for edema Prednisone taper now down to 5 mg alternating with 10 every other day. Sharp pains in mid back without radiation started yesterday CXR 07/01/14 IMPRESSION:  No pulmonary emboli or other acute abnormality. Calcified granulomas  in both lungs.  Electronically Signed  By: Geanie Cooley M.D.  On: 07/01/2014 15:33  11/01/14- 45 yoF former smoker followed for COPD, tobacco cessation support, rhinitis, complicated by hx anxiety, psoriasis  FOLLOWS FOR: Pt states she can tell an increase in SOB and getting worse. Blames weight gain for dyspnea, w/o recent infection. Now taking prednisone 20 mg daily for breathing. Had neg CTa last fall. Has been on cyclosporin from dermatologist for psoriasis with much better control of that. Has not needed regular lasix for fluid retention. Nebulizer seems to make her worse. Occ wakes from sleep with heart burn. O2 2-3L/ Advanced CTa chest 07/01/14 IMPRESSION: No pulmonary emboli or other acute abnormality. Calcified granulomas in both lungs. Electronically Signed  By: Geanie Cooley M.D.  On: 07/01/2014 15:33  Review of Systems-per HPI Constitutional:   No-   weight loss, night sweats, fevers, chills, fatigue, lassitude. HEENT:   No-  headaches,  difficulty swallowing, tooth/dental problems, sore throat,       No-  sneezing, itching, ear ache, +nasal congestion, post nasal drip,  CV:  No-   chest pain, orthopnea, PND, swelling in lower extremities, anasarca,  dizziness, palpitations Resp: +Persistent shortness of breath with exertion or at rest.              No-productive cough,  + non-productive cough,  No- coughing up of blood.           No-change in color of mucus.  No- wheezing.   Skin: +psoriasis rash GI:  + heartburn, No- indigestion, abdominal pain, nausea,  vomiting,  GU:  MS:  No-   joint pain or swelling.   Neuro-     nothing unusual Psych:  No- change in mood or affect. No depression or anxiety.  No memory loss.   Objective:   Physical Exam General- Alert, Oriented, Affect-appropriate, Distress- none acute. O2 2.5L/ 100%, weight gain Skin- +Dry with no visible plaques Lymphadenopathy- none Head- atraumatic            Eyes- Gross vision intact, PERRLA, conjunctivae clear secretions            Ears- Hearing, canals-normal            Nose- turbinate edema, + External and Septal dev, mucus, No polyps, erosion,                             Throat- Mallampati II , mucosa clear , drainage- none, tonsils- atrophic Neck- flexible , trachea midline, no stridor , thyroid nl, carotid no bruit Chest - symmetrical excursion , unlabored           Heart/CV- RRR , no murmur , no gallop  , no rub, nl s1 s2                           - JVD- none , edema- none, stasis changes- none, varices- none           Lung- +very distant, wheeze-none, cough- none, dullness-none,  rub-none.                         Chest wall-  Abd-  Br/ Gen/ Rectal- Not done, not indicated Extrem- cyanosis- none, clubbing, none, atrophy- none, strength- nl Neuro- grossly intact to observation

## 2014-11-01 NOTE — Patient Instructions (Signed)
Script sent for trial of theophylline   Try 1 with breakfast and supper daily to see if it helps your breathing

## 2014-11-01 NOTE — Assessment & Plan Note (Signed)
Successful long term remission

## 2014-11-01 NOTE — Assessment & Plan Note (Signed)
Probably gradual progression of disease as expected, complicated by some weight gain. Don't want to add more steroid if possible. Plan- try theophylline with discussion

## 2014-11-16 ENCOUNTER — Ambulatory Visit: Payer: Medicare Other | Admitting: Family Medicine

## 2014-11-17 ENCOUNTER — Encounter: Payer: Self-pay | Admitting: Family Medicine

## 2014-11-17 ENCOUNTER — Ambulatory Visit: Payer: Medicare Other | Attending: Family Medicine | Admitting: Family Medicine

## 2014-11-17 VITALS — BP 116/80 | HR 121 | Temp 98.1°F | Resp 16 | Ht 61.0 in | Wt 146.0 lb

## 2014-11-17 DIAGNOSIS — Z114 Encounter for screening for human immunodeficiency virus [HIV]: Secondary | ICD-10-CM | POA: Diagnosis not present

## 2014-11-17 DIAGNOSIS — Z0000 Encounter for general adult medical examination without abnormal findings: Secondary | ICD-10-CM | POA: Diagnosis not present

## 2014-11-17 DIAGNOSIS — E559 Vitamin D deficiency, unspecified: Secondary | ICD-10-CM | POA: Diagnosis not present

## 2014-11-17 DIAGNOSIS — H547 Unspecified visual loss: Secondary | ICD-10-CM

## 2014-11-17 DIAGNOSIS — J310 Chronic rhinitis: Secondary | ICD-10-CM

## 2014-11-17 MED ORDER — LORATADINE 10 MG PO TABS
10.0000 mg | ORAL_TABLET | Freq: Every day | ORAL | Status: DC
Start: 2014-11-17 — End: 2015-03-02

## 2014-11-17 NOTE — Progress Notes (Signed)
Patient here to establish care with a primary physician.  Patient states that she is having bilateral ear pain with dizziness upon standing. Patient states she has no other new problems or concerns at this time.

## 2014-11-17 NOTE — Patient Instructions (Signed)
Ms. Gentz,  Thank you for coming in today. It was a pleasure meeting you. I look forward to being your primary doctor.   1. Chronic rhinitis: Add claritin 10 mg once daily  Increase prendisone to 20 mg daily for next 5 days then taper back down to 10 mg daily   2. Dizziness: Checking vit D level if needed I will prescribed a higher dose  3. Gradual decline in vision: Referral to opthalmology  4. Health care maintenance: Screening HIV Mammogram ordered GI referral for screening colonoscopy   F/U in 4-6 weeks for pap smear  Dr. Armen Pickup

## 2014-11-17 NOTE — Progress Notes (Signed)
   Subjective:    Patient ID: Wendy [name], female    DOB: 12-19-1960, 54 y.o.   MRN: [id number] CC: establish care, ear pain and dizziness upon standing  HPI 54 yo F:  1. Ear pressure: for 3 days. Has sick contact of son. No fever. Taking prednisone for COPD 10 mg daily. Has chronic rhinitis and has noticed worsening symptoms since tapering down on prednisone.  2. Blurred vision: vision slowly getting blurry. No eye pain or loss of vision.   3. Dizziness: patient complaining of intermittent dizziness which has improved. No weakness, numbness or focal neurological deficit.   4. HM: declines screening HIV.   Soc Hx: former smoker, quit 16 months ago  Med Hx: end stage COPD on chronic O2  Surg Hx: s/p C-section  Review of Systems As per HPI     Objective:   Physical Exam BP 116/80 mmHg  Pulse 121  Temp(Src) 98.1 F (36.7 C)  Resp 16  Ht 5\' 1"  (1.549 m)  Wt 146 lb (66.225 kg)  BMI 27.60 kg/m2  SpO2 99% General appearance: alert, cooperative and no distress has Poole in place  Head: Normocephalic, without obvious abnormality, atraumatic Eyes: conjunctivae/corneas clear. PERRL, EOM's intact. Ears: normal TM's and external ear canals both ears Nose: no discharge, turbinates pink, swollen Throat: dry mucus membranes, normal oropharynx  Neck: no adenopathy, no carotid bruit, no JVD, supple, symmetrical, trachea midline and thyroid not enlarged, symmetric, no tenderness/mass/nodules Lungs: clear to auscultation bilaterally, decreased air movement b/l  Heart: regular rate and rhythm, S1, S2 normal, no murmur, click, rub or gallop Extremities: extremities normal, atraumatic, no cyanosis or edema    Assessment & Plan:

## 2014-11-18 NOTE — Assessment & Plan Note (Addendum)
Dizziness: Checking vit D level if needed I will prescribed a higher dose  Patient declined blood draw and will have vit D checked at pulmonology visit

## 2014-11-18 NOTE — Assessment & Plan Note (Signed)
Gradual decline in vision: Referral to opthalmology

## 2014-11-18 NOTE — Assessment & Plan Note (Signed)
  1. Chronic rhinitis: Add claritin 10 mg once daily  Increase prendisone to 20 mg daily for next 5 days then taper back down to 10 mg daily

## 2014-11-18 NOTE — Assessment & Plan Note (Signed)
Health care maintenance: Screening HIV Mammogram ordered GI referral for screening colonoscopy

## 2014-11-25 ENCOUNTER — Other Ambulatory Visit: Payer: Self-pay | Admitting: Family Medicine

## 2014-11-25 DIAGNOSIS — Z1231 Encounter for screening mammogram for malignant neoplasm of breast: Secondary | ICD-10-CM

## 2014-12-07 ENCOUNTER — Telehealth: Payer: Self-pay | Admitting: Internal Medicine

## 2014-12-07 MED ORDER — THEOPHYLLINE ER 400 MG PO TB24: 200.0000 mg | ORAL_TABLET | Freq: Two times a day (BID) | ORAL | Status: DC

## 2014-12-07 NOTE — Telephone Encounter (Signed)
Pt is currently on theophylline 200mg  BID. They are no longer making this strength. Will need an alternative.  CY - please advise. Thanks.

## 2014-12-07 NOTE — Telephone Encounter (Signed)
Rx has been sent in per CY. Pt is aware of medication change. Nothing further was needed. 

## 2014-12-07 NOTE — Telephone Encounter (Signed)
We can change to theophylline 400 mg, # 30, to take 1/2 tab twice daily with food, refill x 11

## 2014-12-29 ENCOUNTER — Other Ambulatory Visit: Payer: Medicare Other | Admitting: Family Medicine

## 2015-01-07 ENCOUNTER — Encounter: Payer: Self-pay | Admitting: Gastroenterology

## 2015-01-07 ENCOUNTER — Other Ambulatory Visit: Payer: Medicare Other | Admitting: Family Medicine

## 2015-01-08 ENCOUNTER — Other Ambulatory Visit: Payer: Self-pay | Admitting: Internal Medicine

## 2015-01-12 ENCOUNTER — Other Ambulatory Visit: Payer: Self-pay | Admitting: Internal Medicine

## 2015-01-12 ENCOUNTER — Telehealth: Payer: Self-pay | Admitting: Internal Medicine

## 2015-01-12 MED ORDER — ALPRAZOLAM 1 MG PO TABS: ORAL_TABLET | ORAL | Status: DC

## 2015-01-12 NOTE — Telephone Encounter (Signed)
Pt aware that Xanax is being refilled.  This has been called in [facility] Nothing further needed.

## 2015-01-12 NOTE — Telephone Encounter (Signed)
Last OV 11/01/14 Pending OV 03/02/15 Last refill 07/15/14 with 5 additional refills  CY - please advise on refill. Thanks.

## 2015-01-12 NOTE — Telephone Encounter (Signed)
Ok to refill xanax 

## 2015-01-14 ENCOUNTER — Other Ambulatory Visit: Payer: Medicare Other | Admitting: Family Medicine

## 2015-03-02 ENCOUNTER — Ambulatory Visit (INDEPENDENT_AMBULATORY_CARE_PROVIDER_SITE_OTHER): Payer: Medicare Other | Admitting: Internal Medicine

## 2015-03-02 ENCOUNTER — Encounter: Payer: Self-pay | Admitting: Internal Medicine

## 2015-03-02 VITALS — BP 106/64 | HR 105 | Ht 61.0 in | Wt 135.0 lb

## 2015-03-02 DIAGNOSIS — R918 Other nonspecific abnormal finding of lung field: Secondary | ICD-10-CM

## 2015-03-02 DIAGNOSIS — L409 Psoriasis, unspecified: Secondary | ICD-10-CM | POA: Diagnosis not present

## 2015-03-02 DIAGNOSIS — J449 Chronic obstructive pulmonary disease, unspecified: Secondary | ICD-10-CM | POA: Diagnosis not present

## 2015-03-02 MED ORDER — ALBUTEROL SULFATE HFA 108 (90 BASE) MCG/ACT IN AERS: INHALATION_SPRAY | RESPIRATORY_TRACT | Status: DC

## 2015-03-02 NOTE — Patient Instructions (Signed)
Continue to try for the lowest prednisone dose that you need.  Ok to stop the theophylline now and lets see how you do.

## 2015-03-02 NOTE — Progress Notes (Signed)
Patient ID: Wendy [name], female    DOB: Jul 27, 1961, 54 y.o.   MRN: [id number] HPI 03/15/11- 34 yoF smoker followed for COPD, tobacco cessation support, rhinitis, complicated by hx anxiety Last here November 06, 2010- note reviewed. She has not smoked since December 2011.  Using her oxygen for the past week since she caught a cold with head and chest congestion, purulent sputum, hoarse. No fever or sore throat. Using her regular meds. Using her nebulizer regularly.  Had 7 teeth pulled in May.   08/16/11- 50 yoF smoker followed for COPD, tobacco cessation support, rhinitis, complicated by hx anxiety Has had flu vaccine. October 28 she went to [facility] with acute bronchitis after catching a cold. Got better after treatment there but not well. We called in prednisone and a Z-Pak. She still feels some chest congestion, cough with clear mucus, feels tight. Denies fever, chest pain or blood. Has had oxygen for several months, used most of the time. Says she quit cigarettes this year by using an electronic cigarette with a mild insert. Nasal congestion especially left side. Occasional blood from left nostril. Chest x-ray: 07/15/2011-hyperinflation, no acute disease. I reviewed images.  02/25/12- 50 yoF smoker followed for COPD, tobacco cessation support, rhinitis, complicated by hx anxiety : c/o sob, congestion, and chest tightness. She is remaining off of cigarettes. Her COPD assessment test (CAT) score is 30/40 She had done well for the last several months as her last visit. Just 3 days ago and acute illness began as a gastroenteritis followed by shortness of breath, nonproductive cough and wheeze. She increased her oxygen. Using her nebulizer. Feels better today.  10/16/12- 51 yoF formersmoker followed for COPD, tobacco cessation support, rhinitis, complicated by hx anxiety follow-up: Pt c/o increased SOB with activity that has gradually been worse since last OV.  Antibiotics 2 or 3  times for bronchitis since last here. Needs flu vaccine. Oxygen 2-3 L/Advanced. Nebulizer helps. Not acute-this is baseline for her.  02/13/13- 52 yoF former smoker followed for COPD, tobacco cessation support, rhinitis, complicated by hx anxiety COPD w/ PFT. Pt reports breathing has been terrible. has a little occasional dry cough, chest tx but no wheezing.  Stays short of breath. Had a Z-Pak 2 weeks ago for bronchitis.still feels tight if she takes a deep breath. Rarely any mucus. No chest pain or ankle edema. O2 2-3L/ Advanced. After discussion, she is not interested in lung transplant. CXR 3/13 /14 IMPRESSION:  Old granulomatous disease and chronic hyperinflation.  No acute abnormalities.  Original Report Authenticated By: Ulyses Southward, M.D. PFT 02/13/2013-very severe obstructive airways disease with insignificant response to bronchodilator, diffusion severely reduced. Patient was unable to perform lung volume test. FVC 1.23/39%, FEV1 0.48/19%, FEV1/FVC 0.39/49%, FEF 25-75% 0.18/7%, DLCO 46%.  03/12/13- 30 yoF former smoker followed for COPD, tobacco cessation support, rhinitis, complicated by hx anxiety FOLLOWS FOR: pt wanted to discuss lung transplant- was told about it on 02-13-13. Wants more information. Remains off cigarettes.   Daughter here Feeling stable with no acute issues. Continuous O2 2.5L/ Advanced. We discussed progression of lung disease, outlined limitations of lung transplant and common criteria. Her disease is severe enough that she may be in her transplant window.  Preliminary discussion of end of life.  08/07/13- 31 yoF former smoker followed for COPD, tobacco cessation support, rhinitis, complicated by hx anxiety FOLLOWS FOR: having hard time with breathing; put on abx and prednisone. Finishing pred and doxy. Feels need to extend course- still  green with cough and chest is tight. Incidental mention of extensive rash- onset 2 years ago. CXR 11/27/12 IMPRESSION:  Old  granulomatous disease and chronic hyperinflation.  No acute abnormalities.  Original Report Authenticated By: Ulyses Southward, M.D.  10/15/13- 53 yoF former smoker followed for COPD, tobacco cessation support, rhinitis, complicated by hx anxiety                Daughter here Post hospital- 1/19-10/24/13- acute on chronic respiratory failure, COPD exacerbation with right upper lobe community pneumonia, diastolic dysfunction.  O2 2-3L/ Advanced Feeling much better. Mild residual dry cough as she tapers prednisone. Not smoking. CT chest 10/08/13 IMPRESSION:  1. No evidence of pulmonary embolus.  2. Focal right upper lobe pneumonia noted.  3. Few scattered small pulmonary nodules seen, measuring up to 5 mm  in size. If the patient is at high risk for bronchogenic carcinoma,  follow-up chest CT at 6-12 months is recommended. If the patient is  at low risk for bronchogenic carcinoma, follow-up chest CT at 12  months is recommended. This recommendation follows the consensus  statement: Guidelines for Management of Small Pulmonary Nodules  Detected on CT Scans: A Statement from the Fleischner Society as  published in Radiology 2005;237:395-400.  Electronically Signed  By: Roanna Raider M.D.  On: 10/08/2013 05:07  12/07/13- 37 yoF former smoker followed for COPD, tobacco cessation support, rhinitis, complicated by hx anxiety                Daughter here FOLLOWS FOR: feels like she is having a flare up and wonders if she is getting a cold as well-if abx given please print for her to take with her to pharmacy. Continues O2 3 L/Advanced, prednisone 10 mg daily maintenance Head and chest congestion/chest cold x3 days. Dry cough, no fever. Requalified for oxygen.  07/15/14-53 yoF former smoker followed for COPD, tobacco cessation support, rhinitis, complicated by hx anxiety, psoriasis                 Follows For:  Pt hospitalized at [facility] on 07/01/14 for copd, swelling and severe psoriasis break out - SOB on  exertion better than it was - Feels like a knife in center of back - Worse with deep breath -Swelling in feet and ankles -  Started on Cyclosporin 4 days ago for psoriasis, Lasix for edema Prednisone taper now down to 5 mg alternating with 10 every other day. Sharp pains in mid back without radiation started yesterday CXR 07/01/14 IMPRESSION:  No pulmonary emboli or other acute abnormality. Calcified granulomas  in both lungs.  Electronically Signed  By: Geanie Cooley M.D.  On: 07/01/2014 15:33  11/01/14- 57 yoF former smoker followed for COPD, tobacco cessation support, rhinitis, complicated by hx anxiety, psoriasis  FOLLOWS FOR: Pt states she can tell an increase in SOB and getting worse. Blames weight gain for dyspnea, w/o recent infection. Now taking prednisone 20 mg daily for breathing. Had neg CTa last fall. Has been on cyclosporin from dermatologist for psoriasis with much better control of that. Has not needed regular lasix for fluid retention. Nebulizer seems to make her worse. Occ wakes from sleep with heart burn. O2 2-3L/ Advanced CTa chest 07/01/14 IMPRESSION: No pulmonary emboli or other acute abnormality. Calcified granulomas in both lungs. Electronically Signed  By: Geanie Cooley M.D.  On: 07/01/2014 15:33  03/02/15- 53 yoF former smoker followed for COPD, tobacco cessation support, rhinitis, complicated by hx anxiety, psoriasis  FOLLOWS FOR: Pt states her breathing  is unchanged since the last visit. She has been taking 5-10 mg pred since last visit. She is using proair twice per day on average and only uses neb about once per month. Oxygen 2-3 L/Advanced. She feels her breathing is more comfortable but it has been in a long time.  Psoriasis treatment changed from methotrexate to Humira. There is concern of interaction with theophylline. Not clear that theophylline has had much benefit for her lungs.  Review of Systems-per HPI Constitutional:   No-   weight loss, night  sweats, fevers, chills, fatigue, lassitude. HEENT:   No-  headaches, difficulty swallowing, tooth/dental problems, sore throat,       No-  sneezing, itching, ear ache, +nasal congestion, post nasal drip,  CV:  No-   chest pain, orthopnea, PND, swelling in lower extremities, anasarca,  dizziness, palpitations Resp: +Persistent shortness of breath with exertion or at rest.              No-productive cough,  + non-productive cough,  No- coughing up of blood.           No-change in color of mucus.  No- wheezing.   Skin: +psoriasis rash GI:  + heartburn, No- indigestion, abdominal pain, nausea, vomiting,  GU:  MS:  No-   joint pain or swelling.   Neuro-     nothing unusual Psych:  No- change in mood or affect. No depression or anxiety.  No memory loss.   Objective:   Physical Exam General- Alert, Oriented, Affect-appropriate, Distress- none acute. O2 2 L Skin- +Dry with no visible plaques Lymphadenopathy- none Head- atraumatic            Eyes- Gross vision intact, PERRLA, conjunctivae clear secretions            Ears- Hearing, canals-normal            Nose- turbinate edema, + External and Septal dev, mucus, No polyps, erosion,                             Throat- Mallampati II , mucosa clear , drainage- none, tonsils- atrophic Neck- flexible , trachea midline, no stridor , thyroid nl, carotid no bruit Chest - symmetrical excursion , unlabored           Heart/CV- RRR , no murmur , no gallop  , no rub, nl s1 s2                           - JVD- none , edema- none, stasis changes- none, varices- none           Lung- +very distant, wheeze-none, cough- none, dullness-none,  rub-none.                         Chest wall-  Abd-  Br/ Gen/ Rectal- Not done, not indicated Extrem- cyanosis- none, clubbing, none, atrophy- none, strength- nl Neuro- grossly intact to observation

## 2015-03-06 NOTE — Assessment & Plan Note (Signed)
Most recent chest CT showed only calcified granulomas with no nodules of concern

## 2015-03-06 NOTE — Assessment & Plan Note (Signed)
Skin looks much better currently with small pink areas but no significant visible scaling

## 2015-03-06 NOTE — Assessment & Plan Note (Signed)
Oxygen remains medically necessary We want to see if we can get off theophylline since it would interact with Humira needed for her psoriasis

## 2015-03-14 ENCOUNTER — Telehealth: Payer: Self-pay | Admitting: Internal Medicine

## 2015-03-14 MED ORDER — AZITHROMYCIN 250 MG PO TABS: ORAL_TABLET | ORAL | Status: DC

## 2015-03-14 NOTE — Telephone Encounter (Signed)
Offer Zpak 

## 2015-03-14 NOTE — Telephone Encounter (Signed)
Pt aware of recs. RX sent in. Nothing further needed 

## 2015-03-14 NOTE — Telephone Encounter (Signed)
Spoke with pt. Started taking Humira and is doing well with it so far. Reports that she getting a chest cold. Dry cough, chest tightness, SOB and wheezing are present. Would like something called in.  Allergies  Allergen Reactions  . Augmentin [Amoxicillin-Pot Clavulanate] Nausea Only  . Codeine Nausea Only  . Fexofenadine Nausea Only    allegra  . Other     PAIN MEDICATIONS-nausea   Current Outpatient Prescriptions on File Prior to Visit  Medication Sig Dispense Refill  . ADVAIR DISKUS 500-50 MCG/DOSE AEPB INHALE 1 PUFF INTO THE LUNGS 2 TIMES DAILY 60 each 3  . albuterol (PROAIR HFA) 108 (90 BASE) MCG/ACT inhaler 2 puffs every 4 hours as needed- rescue 2 Inhaler prn  . albuterol (PROVENTIL) (2.5 MG/3ML) 0.083% nebulizer solution Take 2.5 mg by nebulization every 6 (six) hours as needed for wheezing or shortness of breath.    . ALPRAZolam (XANAX) 1 MG tablet 1 tab, three times daily as needed 90 tablet 5  . diphenhydrAMINE (BENADRYL) 25 mg capsule Take 25 mg by mouth daily as needed for itching.    . fluticasone (FLONASE) 50 MCG/ACT nasal spray Place 2 sprays into both nostrils daily. 16 g 11  . furosemide (LASIX) 20 MG tablet 1 tablet every other day as needed 10 tablet 0  . hydrOXYzine (ATARAX/VISTARIL) 25 MG tablet Take 1 tablet (25 mg total) by mouth 3 (three) times daily as needed for itching. 30 tablet 0  . predniSONE (DELTASONE) 10 MG tablet TAKE 1 OR 2 TABLETS DAILY AS NEEDED 60 tablet 3  . theophylline (UNIPHYL) 400 MG 24 hr tablet Take 0.5 tablets (200 mg total) by mouth 2 (two) times daily with a meal. 30 tablet 11  . tiotropium (SPIRIVA HANDIHALER) 18 MCG inhalation capsule PLACE 1 CAPSULE INTO INHALER AND INHALE CONTENTS ONCE DAILY 30 capsule 11  . triamcinolone cream (KENALOG) 0.1 % Apply 1 application topically 2 (two) times daily.     No current facility-administered medications on file prior to visit.    CY - please advise. Thanks.

## 2015-03-15 ENCOUNTER — Telehealth: Payer: Self-pay | Admitting: Internal Medicine

## 2015-03-15 NOTE — Telephone Encounter (Signed)
Spoke with pt. She reports her dermatologists prescribes humira for her. I advised her to call their office to let them know and if they feel she should get the injection. Nothing further needed

## 2015-04-05 ENCOUNTER — Other Ambulatory Visit: Payer: Self-pay | Admitting: Internal Medicine

## 2015-05-04 ENCOUNTER — Other Ambulatory Visit: Payer: Self-pay | Admitting: Internal Medicine

## 2015-05-04 ENCOUNTER — Telehealth: Payer: Self-pay | Admitting: Internal Medicine

## 2015-05-04 NOTE — Telephone Encounter (Signed)
Watch for thrush. If this is just hoarseness, without sore throat or obvious infection, then gargling with salt water a couple of times daily should help in a few days.

## 2015-05-04 NOTE — Telephone Encounter (Signed)
Spoke with pt. She is aware of CY's recommendations. She agreed and verbalized understanding.

## 2015-05-04 NOTE — Telephone Encounter (Signed)
Spoke with pt, c/o hoarseness X2 weeks.  Denies sore throat, chest congestion, fever, increased sob, PND. Pt started humira on 03/09/15, no other new med starts. Gets these through [facility]  [facility] [address].    Requesting CY's recs.  Aware that it may be tomorrow before she receives a response.    Last ov: 03/02/15 Next ov: 06/02/15  CY please advise.  Thanks!  Allergies  Allergen Reactions  . Augmentin [Amoxicillin-Pot Clavulanate] Nausea Only  . Codeine Nausea Only  . Fexofenadine Nausea Only    allegra  . Other     PAIN MEDICATIONS-nausea   Current Outpatient Prescriptions on File Prior to Visit  Medication Sig Dispense Refill  . ADVAIR DISKUS 500-50 MCG/DOSE AEPB INHALE 1 PUFF INTO THE LUNGS 2 TIMES DAILY 60 each 3  . albuterol (PROAIR HFA) 108 (90 BASE) MCG/ACT inhaler 2 puffs every 4 hours as needed- rescue 2 Inhaler prn  . albuterol (PROVENTIL) (2.5 MG/3ML) 0.083% nebulizer solution Take 2.5 mg by nebulization every 6 (six) hours as needed for wheezing or shortness of breath.    . ALPRAZolam (XANAX) 1 MG tablet 1 tab, three times daily as needed 90 tablet 5  . azithromycin (ZITHROMAX) 250 MG tablet Take as directed 6 tablet 0  . diphenhydrAMINE (BENADRYL) 25 mg capsule Take 25 mg by mouth daily as needed for itching.    . fluticasone (FLONASE) 50 MCG/ACT nasal spray Place 2 sprays into both nostrils daily. 16 g 11  . furosemide (LASIX) 20 MG tablet 1 tablet every other day as needed 10 tablet 0  . hydrOXYzine (ATARAX/VISTARIL) 25 MG tablet Take 1 tablet (25 mg total) by mouth 3 (three) times daily as needed for itching. 30 tablet 0  . predniSONE (DELTASONE) 10 MG tablet TAKE 1 OR 2 TABLETS DAILY AS NEEDED 60 tablet 3  . theophylline (UNIPHYL) 400 MG 24 hr tablet Take 0.5 tablets (200 mg total) by mouth 2 (two) times daily with a meal. 30 tablet 11  . tiotropium (SPIRIVA HANDIHALER) 18 MCG inhalation capsule PLACE 1 CAPSULE INTO INHALER AND INHALE CONTENTS  ONCE DAILY 30 capsule 11  . triamcinolone cream (KENALOG) 0.1 % Apply 1 application topically 2 (two) times daily.     No current facility-administered medications on file prior to visit.

## 2015-05-16 ENCOUNTER — Ambulatory Visit: Payer: Medicare Other | Admitting: Family Medicine

## 2015-05-17 ENCOUNTER — Encounter: Payer: Self-pay | Admitting: Family Medicine

## 2015-05-17 ENCOUNTER — Ambulatory Visit: Payer: Medicare Other | Attending: Family Medicine | Admitting: Family Medicine

## 2015-05-17 VITALS — BP 123/84 | HR 94 | Temp 99.0°F | Resp 16 | Ht 61.0 in | Wt 141.0 lb

## 2015-05-17 DIAGNOSIS — Z87891 Personal history of nicotine dependence: Secondary | ICD-10-CM | POA: Diagnosis not present

## 2015-05-17 DIAGNOSIS — Z9981 Dependence on supplemental oxygen: Secondary | ICD-10-CM | POA: Insufficient documentation

## 2015-05-17 DIAGNOSIS — R490 Dysphonia: Secondary | ICD-10-CM | POA: Diagnosis not present

## 2015-05-17 DIAGNOSIS — M069 Rheumatoid arthritis, unspecified: Secondary | ICD-10-CM | POA: Insufficient documentation

## 2015-05-17 DIAGNOSIS — L409 Psoriasis, unspecified: Secondary | ICD-10-CM | POA: Diagnosis not present

## 2015-05-17 DIAGNOSIS — J449 Chronic obstructive pulmonary disease, unspecified: Secondary | ICD-10-CM | POA: Diagnosis not present

## 2015-05-17 MED ORDER — ADALIMUMAB 40 MG/0.8ML ~~LOC~~ AJKT: 40.0000 mg | AUTO-INJECTOR | SUBCUTANEOUS | Status: DC

## 2015-05-17 MED ORDER — PANTOPRAZOLE SODIUM 20 MG PO TBEC
20.0000 mg | DELAYED_RELEASE_TABLET | Freq: Every day | ORAL | Status: DC
Start: 2015-05-17 — End: 2015-08-24

## 2015-05-17 MED ORDER — METHYLPREDNISOLONE ACETATE 80 MG/ML IJ SUSP
80.0000 mg | Freq: Once | INTRAMUSCULAR | Status: AC
  Administered 2015-05-17: 80 mg via INTRAMUSCULAR

## 2015-05-17 NOTE — Progress Notes (Signed)
Subjective:    Patient ID: Wendy [name], female    DOB: 1961-04-22, 54 y.o.   MRN: [id number] CC: hoarseness x 3 weeks  HPI 54 yo F with COPD, O2 dependent presents for f.u   1. Hoarseness: x 3 weeks. Takes prednisone. Has cut her self back from 10 to 20 mg daily to 5 mg as needed this week. Has been on prednisone for roughly on year off and on. Not taking a PPI.  2. Rheumatoid arthritis: has been on Humira for the past 2 months. Is currently having a flare since tapering down on prednisone. Has scheduled f/u with her rheumatologist for tomorrow.   Social History  Substance Use Topics  . Smoking status: Former Smoker -- 1.00 packs/day for 40 years    Types: Cigarettes    Start date: 11/10/1970    Quit date: 09/17/2013  . Smokeless tobacco: Never Used  . Alcohol Use: No     Comment: 5 cans of beer daily  " 06/2014 drinks occasional ".  3s/2/16 No longer drink   Review of Systems  Constitutional: Negative for fever and chills.  HENT: Positive for voice change. Negative for trouble swallowing.   Eyes: Negative for visual disturbance.  Respiratory: Negative for shortness of breath.   Cardiovascular: Negative for chest pain.  Gastrointestinal: Negative for abdominal pain and blood in stool.  Musculoskeletal: Negative for back pain and arthralgias.  Skin: Positive for rash.  Allergic/Immunologic: Negative for immunocompromised state.  Hematological: Negative for adenopathy. Does not bruise/bleed easily.  Psychiatric/Behavioral: Negative for suicidal ideas and dysphoric mood.      Objective:   Physical Exam  Constitutional: She is oriented to person, place, and time. She appears well-developed and well-nourished. No distress.  HENT:  Head: Normocephalic.  Mouth/Throat: Oropharynx is clear and moist.  Neck: Thyromegaly present.  Cardiovascular: Normal rate, regular rhythm, normal heart sounds and intact distal pulses.   Pulmonary/Chest: Effort normal and breath sounds normal.   Musculoskeletal: She exhibits no edema.  Lymphadenopathy:    She has no cervical adenopathy.  Neurological: She is alert and oriented to person, place, and time.  Skin: Skin is warm and dry. Rash (diffuse scaly rash with flakes on extremities and low back ) noted.  Psychiatric: She has a normal mood and affect.  BP 123/84 mmHg  Pulse 94  Temp(Src) 99 F (37.2 C) (Oral)  Resp 16  Ht 5\' 1"  (1.549 m)  Wt 141 lb (63.957 kg)  BMI 26.66 kg/m2  SpO2 100% Wt Readings from Last 3 Encounters:  05/17/15 141 lb (63.957 kg)  03/02/15 135 lb (61.236 kg)  11/17/14 146 lb (66.225 kg)       Assessment & Plan:  Wendy [name] was seen today for hoarse.  Diagnoses and all orders for this visit:  Psoriasis with flare  -     Adalimumab (HUMIRA PEN-CROHNS STARTER) 40 MG/0.8ML PNKT; Inject 40 mg into the skin every 14 (fourteen) days. -     methylPREDNISolone acetate (DEPO-MEDROL) injection 80 mg; Inject 1 mL (80 mg total) into the muscle once. -patient has f/u with her rheumatologist tomorrow   Hoarseness- could be related to long term prednisone use w/o PPI, thyroid slightly enlarged on exam.  P: -     pantoprazole (PROTONIX) 20 MG tablet; Take 1 tablet (20 mg total) by mouth daily before supper. -     T3, Free -     T4, free -     TSH -f/u in 8 weeks with ENT  referral for direct laryngoscopy if hoarseness persist

## 2015-05-17 NOTE — Progress Notes (Signed)
Hoarseness x3 week  Throat hurt sometime  No hx tobacco

## 2015-05-17 NOTE — Patient Instructions (Addendum)
Wendy [name],   Wendy [name] was seen today for hoarse.  Diagnoses and all orders for this visit:  Psoriasis -     Adalimumab (HUMIRA PEN-CROHNS STARTER) 40 MG/0.8ML PNKT; Inject 40 mg into the skin every 14 (fourteen) days. -     methylPREDNISolone acetate (DEPO-MEDROL) injection 80 mg; Inject 1 mL (80 mg total) into the muscle once.  Hoarseness -     pantoprazole (PROTONIX) 20 MG tablet; Take 1 tablet (20 mg total) by mouth daily before supper. -     T3, Free -     T4, free -     TSH  F/u in September for flu shot  F/u with me in 8 weeks for hoarseness  Dr. Armen Pickup

## 2015-05-18 LAB — T4, FREE: FREE T4: 0.94 ng/dL (ref 0.80–1.80)

## 2015-05-18 LAB — T3, FREE: T3, Free: 2.9 pg/mL (ref 2.3–4.2)

## 2015-05-18 LAB — TSH: TSH: 1.257 u[IU]/mL (ref 0.350–4.500)

## 2015-05-31 ENCOUNTER — Telehealth: Payer: Self-pay | Admitting: *Deleted

## 2015-05-31 NOTE — Telephone Encounter (Signed)
Date of birth verified by pt Normal test result given Pt verbalized understanding

## 2015-05-31 NOTE — Telephone Encounter (Signed)
-----   Message from Dessa Phi, MD sent at 05/18/2015  9:11 AM EDT ----- Normal thyroid function test

## 2015-06-02 ENCOUNTER — Ambulatory Visit (INDEPENDENT_AMBULATORY_CARE_PROVIDER_SITE_OTHER): Payer: Medicare Other | Admitting: Internal Medicine

## 2015-06-02 ENCOUNTER — Encounter: Payer: Self-pay | Admitting: Internal Medicine

## 2015-06-02 VITALS — BP 110/74 | HR 95 | Ht 61.0 in | Wt 143.8 lb

## 2015-06-02 DIAGNOSIS — Z23 Encounter for immunization: Secondary | ICD-10-CM

## 2015-06-02 DIAGNOSIS — J449 Chronic obstructive pulmonary disease, unspecified: Secondary | ICD-10-CM | POA: Diagnosis not present

## 2015-06-02 DIAGNOSIS — G4700 Insomnia, unspecified: Secondary | ICD-10-CM

## 2015-06-02 NOTE — Progress Notes (Signed)
Patient ID: Wendy [name], female    DOB: [birth year], 54 y.o.   MRN: [id number] HPI 03/15/11- 40 yoF smoker followed for COPD, tobacco cessation support, rhinitis, complicated by hx anxiety Last here November 06, 2010- note reviewed. She has not smoked since December 2011.  Using her oxygen for the past week since she caught a cold with head and chest congestion, purulent sputum, hoarse. No fever or sore throat. Using her regular meds. Using her nebulizer regularly.  Had 7 teeth pulled in May.   08/16/11- 50 yoF smoker followed for COPD, tobacco cessation support, rhinitis, complicated by hx anxiety Has had flu vaccine. October 28 [facility] with acute bronchitis after catching a cold. Got better after treatment there but not well. We called in prednisone and a Z-Pak. She still feels some chest congestion, cough with clear mucus, feels tight. Denies fever, chest pain or blood. Has had oxygen for several months, used most of the time. Says she quit cigarettes this year by using an electronic cigarette with a mild insert. Nasal congestion especially left side. Occasional blood from left nostril. Chest x-ray: 07/15/2011-hyperinflation, no acute disease. I reviewed images.  02/25/12- 50 yoF smoker followed for COPD, tobacco cessation support, rhinitis, complicated by hx anxiety : c/o sob, congestion, and chest tightness. She is remaining off of cigarettes. Her COPD assessment test (CAT) score is 30/40 She had done well for the last several months as her last visit. Just 3 days ago and acute illness began as a gastroenteritis followed by shortness of breath, nonproductive cough and wheeze. She increased her oxygen. Using her nebulizer. Feels better today.  10/16/12- 51 yoF formersmoker followed for COPD, tobacco cessation support, rhinitis, complicated by hx anxiety follow-up: Pt c/o increased SOB with activity that has gradually been worse since last OV.  Antibiotics 2 or 3  times for bronchitis since last here. Needs flu vaccine. Oxygen 2-3 L/Advanced. Nebulizer helps. Not acute-this is baseline for her.  02/13/13- 53 yoF former smoker followed for COPD, tobacco cessation support, rhinitis, complicated by hx anxiety COPD w/ PFT. Pt reports breathing has been terrible. has a little occasional dry cough, chest tx but no wheezing.  Stays short of breath. Had a Z-Pak 2 weeks ago for bronchitis.still feels tight if she takes a deep breath. Rarely any mucus. No chest pain or ankle edema. O2 2-3L/ Advanced. After discussion, she is not interested in lung transplant. CXR 3/13 /14 IMPRESSION:  Old granulomatous disease and chronic hyperinflation.  No acute abnormalities.  Original Report Authenticated By: Ulyses Southward, M.D. PFT 02/13/2013-very severe obstructive airways disease with insignificant response to bronchodilator, diffusion severely reduced. Patient was unable to perform lung volume test. FVC 1.23/39%, FEV1 0.48/19%, FEV1/FVC 0.39/49%, FEF 25-75% 0.18/7%, DLCO 46%.  03/12/13- 61 yoF former smoker followed for COPD, tobacco cessation support, rhinitis, complicated by hx anxiety FOLLOWS FOR: pt wanted to discuss lung transplant- was told about it on 02-13-13. Wants more information. Remains off cigarettes.   Daughter here Feeling stable with no acute issues. Continuous O2 2.5L/ Advanced. We discussed progression of lung disease, outlined limitations of lung transplant and common criteria. Her disease is severe enough that she may be in her transplant window.  Preliminary discussion of end of life.  08/07/13- 64 yoF former smoker followed for COPD, tobacco cessation support, rhinitis, complicated by hx anxiety FOLLOWS FOR: having hard time with breathing; put on abx and prednisone. Finishing pred and doxy. Feels need to extend course- still  green with cough and chest is tight. Incidental mention of extensive rash- onset 2 years ago. CXR 11/27/12 IMPRESSION:  Old  granulomatous disease and chronic hyperinflation.  No acute abnormalities.  Original Report Authenticated By: Ulyses Southward, M.D.  10/15/13- 53 yoF former smoker followed for COPD, tobacco cessation support, rhinitis, complicated by hx anxiety                Daughter here Post hospital- 1/19-10/24/13- acute on chronic respiratory failure, COPD exacerbation with right upper lobe community pneumonia, diastolic dysfunction.  O2 2-3L/ Advanced Feeling much better. Mild residual dry cough as she tapers prednisone. Not smoking. CT chest 10/08/13 IMPRESSION:  1. No evidence of pulmonary embolus.  2. Focal right upper lobe pneumonia noted.  3. Few scattered small pulmonary nodules seen, measuring up to 5 mm  in size. If the patient is at high risk for bronchogenic carcinoma,  follow-up chest CT at 6-12 months is recommended. If the patient is  at low risk for bronchogenic carcinoma, follow-up chest CT at 12  months is recommended. This recommendation follows the consensus  statement: Guidelines for Management of Small Pulmonary Nodules  Detected on CT Scans: A Statement from the Fleischner Society as  published in Radiology 2005;237:395-400.  Electronically Signed  By: Roanna Raider M.D.  On: 10/08/2013 05:07  12/07/13- 37 yoF former smoker followed for COPD, tobacco cessation support, rhinitis, complicated by hx anxiety                Daughter here FOLLOWS FOR: feels like she is having a flare up and wonders if she is getting a cold as well-if abx given please print for her to take with her to pharmacy. Continues O2 3 L/Advanced, prednisone 10 mg daily maintenance Head and chest congestion/chest cold x3 days. Dry cough, no fever. Requalified for oxygen.  07/15/14-53 yoF former smoker followed for COPD, tobacco cessation support, rhinitis, complicated by hx anxiety, psoriasis                 Follows For:  Pt hospitalized at [facility] on 07/01/14 for copd, swelling and severe psoriasis break out - SOB on  exertion better than it was - Feels like a knife in center of back - Worse with deep breath -Swelling in feet and ankles -  Started on Cyclosporin 4 days ago for psoriasis, Lasix for edema Prednisone taper now down to 5 mg alternating with 10 every other day. Sharp pains in mid back without radiation started yesterday CXR 07/01/14 IMPRESSION:  No pulmonary emboli or other acute abnormality. Calcified granulomas  in both lungs.  Electronically Signed  By: Geanie Cooley M.D.  On: 07/01/2014 15:33  11/01/14- 57 yoF former smoker followed for COPD, tobacco cessation support, rhinitis, complicated by hx anxiety, psoriasis  FOLLOWS FOR: Pt states she can tell an increase in SOB and getting worse. Blames weight gain for dyspnea, w/o recent infection. Now taking prednisone 20 mg daily for breathing. Had neg CTa last fall. Has been on cyclosporin from dermatologist for psoriasis with much better control of that. Has not needed regular lasix for fluid retention. Nebulizer seems to make her worse. Occ wakes from sleep with heart burn. O2 2-3L/ Advanced CTa chest 07/01/14 IMPRESSION: No pulmonary emboli or other acute abnormality. Calcified granulomas in both lungs. Electronically Signed  By: Geanie Cooley M.D.  On: 07/01/2014 15:33  03/02/15- 53 yoF former smoker followed for COPD, tobacco cessation support, rhinitis, complicated by hx anxiety, psoriasis  FOLLOWS FOR: Pt states her breathing  is unchanged since the last visit. She has been taking 5-10 mg pred since last visit. She is using proair twice per day on average and only uses neb about once per month. Oxygen 2-3 L/Advanced. She feels her breathing is more comfortable but it has been in a long time.  Psoriasis treatment changed from methotrexate to Humira. There is concern of interaction with theophylline. Not clear that theophylline has had much benefit for her lungs.  06/02/15-  39 yoF former smoker followed for COPD, tobacco cessation  support, rhinitis, complicated by hx anxiety, psoriasis  Follows For: breathing still the same since last visit: questionable thrush addressed by PCP- protonix for GERD. On Humira since June for psoriasis. Methotrexate added for flare. Has continued prednisone after trying to stop triggered the flare. Dyspnea on exertion unchanged, remaining oxygen dependent at 2-3 L/Advanced. Complains of insomnia not adequately relieved by Xanax  Review of Systems-per HPI Constitutional:   No-   weight loss, night sweats, fevers, chills, fatigue, lassitude. HEENT:   No-  headaches, difficulty swallowing, tooth/dental problems, sore throat,       No-  sneezing, itching, ear ache, +nasal congestion, post nasal drip,  CV:  No-   chest pain, orthopnea, PND, swelling in lower extremities, anasarca,  dizziness, palpitations Resp: +Persistent shortness of breath with exertion or at rest.              No-productive cough,  + non-productive cough,  No- coughing up of blood.           No-change in color of mucus.  No- wheezing.   Skin: +psoriasis rash GI:  + heartburn, No- indigestion, abdominal pain, nausea, vomiting,  GU:  MS:  No-   joint pain or swelling.   Neuro-     nothing unusual Psych:  No- change in mood or affect. No depression or anxiety.  No memory loss.   Objective:   Physical Exam General- Alert, Oriented, Affect-appropriate, Distress- none acute. O2 2 L Skin- + erythematous plaques without much scale Lymphadenopathy- none Head- atraumatic            Eyes- Gross vision intact, PERRLA, conjunctivae clear secretions            Ears- Hearing, canals-normal            Nose- turbinate edema, + External and Septal dev, mucus, No polyps, erosion,                             Throat- Mallampati II , mucosa clear , drainage- none, tonsils- atrophic Neck- flexible , trachea midline, no stridor , thyroid nl, carotid no bruit Chest - symmetrical excursion , unlabored           Heart/CV- RRR , no murmur ,  no gallop  , no rub, nl s1 s2                           - JVD- none , edema- none, stasis changes- none, varices- none           Lung- +very distant, wheeze-none, cough- none, dullness-none,  rub-none.                         Chest wall-  Abd-  Br/ Gen/ Rectal- Not done, not indicated Extrem- cyanosis- none, clubbing, none, atrophy- none, strength- nl Neuro- grossly intact to observation

## 2015-06-02 NOTE — Patient Instructions (Signed)
Flu vax  Sample Belsomra 15 mg    Try 1/2 or 1 tab at bedtime for sleep

## 2015-06-05 DIAGNOSIS — G4700 Insomnia, unspecified: Secondary | ICD-10-CM | POA: Insufficient documentation

## 2015-06-05 NOTE — Assessment & Plan Note (Signed)
Adequate control. Hope to reduce prednisone when psoriasis permits Plan-flu vaccine

## 2015-06-05 NOTE — Assessment & Plan Note (Signed)
Discussed sleep hygiene and expectations Plan-try sample Belsomra 15 mg, starting with fractions of tablet

## 2015-06-07 ENCOUNTER — Telehealth: Payer: Self-pay | Admitting: Internal Medicine

## 2015-06-07 MED ORDER — SUVOREXANT 15 MG PO TABS: 1.0000 | ORAL_TABLET | Freq: Every evening | ORAL | Status: DC | PRN

## 2015-06-07 NOTE — Telephone Encounter (Signed)
Called and spoke to pt. Pt stated the Belsomra sample that she was given is really helping her sleep. Pt stated she does not use this every night. Pt states she is not drowsy the next morning. Pt is requesting an rx of Belsomra 15mg    Dr. please advise. Thanks.   Allergies  Allergen Reactions  . Augmentin [Amoxicillin-Pot Clavulanate] Nausea Only  . Codeine Nausea Only  . Fexofenadine Nausea Only    allegra  . Other     PAIN MEDICATIONS-nausea    Current Outpatient Prescriptions on File Prior to Visit  Medication Sig Dispense Refill  . Adalimumab (HUMIRA PEN-CROHNS STARTER) 40 MG/0.8ML PNKT Inject 40 mg into the skin every 14 (fourteen) days. 2 each   . ADVAIR DISKUS 500-50 MCG/DOSE AEPB INHALE 1 PUFF INTO THE LUNGS 2 TIMES DAILY 60 each 3  . albuterol (PROAIR HFA) 108 (90 BASE) MCG/ACT inhaler 2 puffs every 4 hours as needed- rescue 2 Inhaler prn  . albuterol (PROVENTIL) (2.5 MG/3ML) 0.083% nebulizer solution Take 2.5 mg by nebulization every 6 (six) hours as needed for wheezing or shortness of breath.    . ALPRAZolam (XANAX) 1 MG tablet 1 tab, three times daily as needed 90 tablet 5  . diphenhydrAMINE (BENADRYL) 25 mg capsule Take 25 mg by mouth daily as needed for itching.    . fluocinonide-emollient (LIDEX-E) 0.05 % cream Apply topically 2 (two) times daily.  1  . fluticasone (FLONASE) 50 MCG/ACT nasal spray Place 2 sprays into both nostrils daily. 16 g 11  . furosemide (LASIX) 20 MG tablet 1 tablet every other day as needed 10 tablet 0  . hydrOXYzine (ATARAX/VISTARIL) 25 MG tablet 1 (ONE) TABLET BY MOUTH THREE TIMES DAILY, AS NEEDED  0  . methotrexate (RHEUMATREX) 2.5 MG tablet   0  . pantoprazole (PROTONIX) 20 MG tablet Take 1 tablet (20 mg total) by mouth daily before supper. 30 tablet 2  . predniSONE (DELTASONE) 10 MG tablet TAKE 1 OR 2 TABLETS DAILY AS NEEDED 60 tablet 3  . theophylline (UNIPHYL) 400 MG 24 hr tablet Take 0.5 tablets (200 mg total) by mouth 2 (two) times  daily with a meal. 30 tablet 11  . tiotropium (SPIRIVA HANDIHALER) 18 MCG inhalation capsule PLACE 1 CAPSULE INTO INHALER AND INHALE CONTENTS ONCE DAILY 30 capsule 11   No current facility-administered medications on file prior to visit.

## 2015-06-07 NOTE — Telephone Encounter (Signed)
Ok Rx Belsomra 15 mg, # 30, 1 before sleep as needed  Ref x 5

## 2015-06-07 NOTE — Telephone Encounter (Signed)
Called and spoke to pt. Informed her of the rx. [facility] [address]. Pt verbalized understanding and denied any further questions or concerns at this time.

## 2015-06-07 NOTE — Telephone Encounter (Signed)
Called and spoke to pt. Pt stated the Belsomra is not covered. Advised pt to contact her insurance to see what is covered at a cost she can afford and then will call us back. Pt verbalized understanding and denied any further questions or concerns at this time.

## 2015-06-09 ENCOUNTER — Telehealth: Payer: Self-pay | Admitting: Internal Medicine

## 2015-06-09 NOTE — Telephone Encounter (Signed)
Nicanor Alcon, RN at 06/07/2015 5:27 PM     Status: Signed       Expand All Collapse All   Called and spoke to pt. Pt stated the Belsomra is not covered. Advised pt to contact her insurance to see what is covered at a cost she can afford and then will call us back. Pt verbalized understanding and denied any further questions or concerns at this time       Pt states she spoke with her pharmacist and they told her that her insurance will not give her any sleep meds that are comparable to Belsomra. Pt has not called her insurance as she states shee has never had to do this; explained to patient that each patient has a different formulary list based on their specific insurance plan that will state which meds are covered and the tier they are on(what cost it would be to patient). Pt states she understands and will contact her insurance company and call back with answers. Will forward to CY once we have alternatives to give patient.

## 2015-06-10 MED ORDER — TRAZODONE HCL 50 MG PO TABS: 50.0000 mg | ORAL_TABLET | Freq: Every day | ORAL | Status: DC

## 2015-06-10 NOTE — Telephone Encounter (Signed)
Spoke with pt, states that we need a PA for the [employer] does cover Sonata, Ambien, Rozeram, Ativan, trazodone.   [facility] [address]    CY please advise if you want to do a PA for Belsomnra or if you would like to switch to a preferred medication.  Thanks!

## 2015-06-10 NOTE — Telephone Encounter (Signed)
Pt returned call to nurse. Insurance stated that we need to call them directly to have the insurance approved.  [phone]

## 2015-06-10 NOTE — Telephone Encounter (Signed)
Offer trazodone 50 mg, #30, one at bedtime for sleep when necessary, refill 5

## 2015-06-10 NOTE — Telephone Encounter (Signed)
Spoke with pt. She is fine with changing medication. Rx has been sent in. Nothing further was needed at this time.

## 2015-06-27 ENCOUNTER — Telehealth: Payer: Self-pay | Admitting: Internal Medicine

## 2015-06-27 NOTE — Telephone Encounter (Signed)
Spoke with pt, aware of below recs.  Nothing further needed.  

## 2015-06-27 NOTE — Telephone Encounter (Signed)
Lots of viral colds around now, that antibiotic can't help.  Suggest otc Mucinex-DM, decongestant Sudafed from pharmacist  [facility] to use something like Thera-flu if that helps

## 2015-06-27 NOTE — Telephone Encounter (Signed)
Spoke with pt, c/o prod cough with gray mucus X3 days, chest soreness, sinus congestion,hoarseness. Denies fever.  Pt does note that she takes her Humira inj this week and does not want to interfere with this.  Pt has taken maintenance nasal spray and dayquil to help with symptoms. [facility] [address]   Last ov: 06/02/15 Next ov: 10/03/15  CY please advise.  Thanks!  Allergies  Allergen Reactions  . Augmentin [Amoxicillin-Pot Clavulanate] Nausea Only  . Codeine Nausea Only  . Fexofenadine Nausea Only    allegra  . Other     PAIN MEDICATIONS-nausea   Current Outpatient Prescriptions on File Prior to Visit  Medication Sig Dispense Refill  . Adalimumab (HUMIRA PEN-CROHNS STARTER) 40 MG/0.8ML PNKT Inject 40 mg into the skin every 14 (fourteen) days. 2 each   . ADVAIR DISKUS 500-50 MCG/DOSE AEPB INHALE 1 PUFF INTO THE LUNGS 2 TIMES DAILY 60 each 3  . albuterol (PROAIR HFA) 108 (90 BASE) MCG/ACT inhaler 2 puffs every 4 hours as needed- rescue 2 Inhaler prn  . albuterol (PROVENTIL) (2.5 MG/3ML) 0.083% nebulizer solution Take 2.5 mg by nebulization every 6 (six) hours as needed for wheezing or shortness of breath.    . ALPRAZolam (XANAX) 1 MG tablet 1 tab, three times daily as needed 90 tablet 5  . diphenhydrAMINE (BENADRYL) 25 mg capsule Take 25 mg by mouth daily as needed for itching.    . fluocinonide-emollient (LIDEX-E) 0.05 % cream Apply topically 2 (two) times daily.  1  . fluticasone (FLONASE) 50 MCG/ACT nasal spray Place 2 sprays into both nostrils daily. 16 g 11  . furosemide (LASIX) 20 MG tablet 1 tablet every other day as needed 10 tablet 0  . hydrOXYzine (ATARAX/VISTARIL) 25 MG tablet 1 (ONE) TABLET BY MOUTH THREE TIMES DAILY, AS NEEDED  0  . methotrexate (RHEUMATREX) 2.5 MG tablet   0  . pantoprazole (PROTONIX) 20 MG tablet Take 1 tablet (20 mg total) by mouth daily before supper. 30 tablet 2  . predniSONE (DELTASONE) 10 MG tablet TAKE 1 OR 2 TABLETS DAILY AS NEEDED 60  tablet 3  . Suvorexant (BELSOMRA) 15 MG TABS Take 1 tablet by mouth at bedtime as needed. 30 tablet 5  . theophylline (UNIPHYL) 400 MG 24 hr tablet Take 0.5 tablets (200 mg total) by mouth 2 (two) times daily with a meal. 30 tablet 11  . tiotropium (SPIRIVA HANDIHALER) 18 MCG inhalation capsule PLACE 1 CAPSULE INTO INHALER AND INHALE CONTENTS ONCE DAILY 30 capsule 11  . traZODone (DESYREL) 50 MG tablet Take 1 tablet (50 mg total) by mouth at bedtime. 30 tablet 5   No current facility-administered medications on file prior to visit.

## 2015-07-01 ENCOUNTER — Other Ambulatory Visit: Payer: Self-pay | Admitting: Internal Medicine

## 2015-07-04 ENCOUNTER — Telehealth: Payer: Self-pay | Admitting: Internal Medicine

## 2015-07-04 NOTE — Telephone Encounter (Signed)
Spoke with pt. She needs a refill on Xanax. Advised her that CY has to give approval for this to be refilled and he is on vacation until Wednesday. She would like for this message to be given to CY to address.  CY - please advise on refill. Thanks.

## 2015-07-05 NOTE — Telephone Encounter (Signed)
Pharm calling to check on status or rx, I informed them that CY is out of the office and that we are awaiting his return to ok this.Caren Griffins

## 2015-07-05 NOTE — Telephone Encounter (Signed)
Ok to refill 

## 2015-07-05 NOTE — Telephone Encounter (Signed)
Will forward to Dr Roxy Cedar nurse to ensure follow up on refill Dr Maple Hudson will be back in office 07/07/15. Will be addressed then

## 2015-07-05 NOTE — Telephone Encounter (Signed)
CY Please advise on refill. Pt is aware CY is out of office until Wednesday 07-06-15.

## 2015-07-06 MED ORDER — ALPRAZOLAM 1 MG PO TABS: ORAL_TABLET | ORAL | Status: DC

## 2015-07-06 NOTE — Telephone Encounter (Signed)
Ok to refill 

## 2015-07-06 NOTE — Telephone Encounter (Signed)
rx called in to verified pharmacy.  Pt aware.  Nothing further needed.

## 2015-07-31 ENCOUNTER — Other Ambulatory Visit: Payer: Self-pay | Admitting: Internal Medicine

## 2015-08-23 ENCOUNTER — Telehealth: Payer: Self-pay | Admitting: Family Medicine

## 2015-08-23 NOTE — Telephone Encounter (Signed)
Incoming fax for refill on Pantoprazole SOD DR 20 MG TAB. Please attend to at the earliest convenience. Thank you, Dorothey Baseman, ASA

## 2015-08-24 ENCOUNTER — Other Ambulatory Visit: Payer: Self-pay | Admitting: Internal Medicine

## 2015-08-24 NOTE — Telephone Encounter (Signed)
Rx was refilled

## 2015-08-28 ENCOUNTER — Other Ambulatory Visit: Payer: Self-pay | Admitting: Internal Medicine

## 2015-09-14 ENCOUNTER — Telehealth: Payer: Self-pay | Admitting: Internal Medicine

## 2015-09-14 MED ORDER — CEFDINIR 300 MG PO CAPS: 300.0000 mg | ORAL_CAPSULE | Freq: Two times a day (BID) | ORAL | Status: DC

## 2015-09-14 NOTE — Telephone Encounter (Signed)
Offer cefdinir 300 mg, # 14, 1 twice daily  It may also help to take Mucinex-DM otc

## 2015-09-14 NOTE — Telephone Encounter (Signed)
Pt c/o increased SOB, chest congestion, intermittent low grade fever, fatigue, prod cough with clear/gray mucus X1 month.  Has been taking mucinex, otc cough syrup.  [facility] [address]   Last ov: 06/02/15 Next ov: 10/03/15  CY please advise on recs.  Thanks!   Allergies  Allergen Reactions  . Augmentin [Amoxicillin-Pot Clavulanate] Nausea Only  . Codeine Nausea Only  . Fexofenadine Nausea Only    allegra  . Other     PAIN MEDICATIONS-nausea   Current Outpatient Prescriptions on File Prior to Visit  Medication Sig Dispense Refill  . Adalimumab (HUMIRA PEN-CROHNS STARTER) 40 MG/0.8ML PNKT Inject 40 mg into the skin every 14 (fourteen) days. 2 each   . ADVAIR DISKUS 500-50 MCG/DOSE AEPB INHALE 1 PUFF INTO THE LUNGS 2 TIMES DAILY 60 each 3  . albuterol (PROAIR HFA) 108 (90 BASE) MCG/ACT inhaler 2 puffs every 4 hours as needed- rescue 2 Inhaler prn  . albuterol (PROVENTIL) (2.5 MG/3ML) 0.083% nebulizer solution Take 2.5 mg by nebulization every 6 (six) hours as needed for wheezing or shortness of breath.    . ALPRAZolam (XANAX) 1 MG tablet 1 tab, three times daily as needed 90 tablet 5  . diphenhydrAMINE (BENADRYL) 25 mg capsule Take 25 mg by mouth daily as needed for itching.    . fluocinonide-emollient (LIDEX-E) 0.05 % cream Apply topically 2 (two) times daily.  1  . fluticasone (FLONASE) 50 MCG/ACT nasal spray Place 2 sprays into both nostrils daily. 16 g 11  . furosemide (LASIX) 20 MG tablet 1 tablet every other day as needed 10 tablet 0  . hydrOXYzine (ATARAX/VISTARIL) 25 MG tablet 1 (ONE) TABLET BY MOUTH THREE TIMES DAILY, AS NEEDED  0  . methotrexate (RHEUMATREX) 2.5 MG tablet   0  . pantoprazole (PROTONIX) 20 MG tablet TAKE 1 TABLET (20 MG TOTAL) BY MOUTH DAILY BEFORE SUPPER. 30 tablet 5  . predniSONE (DELTASONE) 10 MG tablet TAKE 1 OR 2 TABLETS DAILY AS NEEDED 60 tablet 3  . Suvorexant (BELSOMRA) 15 MG TABS Take 1 tablet by mouth at bedtime as needed. 30 tablet 5  .  theophylline (UNIPHYL) 400 MG 24 hr tablet Take 0.5 tablets (200 mg total) by mouth 2 (two) times daily with a meal. 30 tablet 11  . tiotropium (SPIRIVA HANDIHALER) 18 MCG inhalation capsule PLACE 1 CAPSULE INTO INHALER AND INHALE CONTENTS ONCE DAILY 30 capsule 11  . traZODone (DESYREL) 50 MG tablet Take 1 tablet (50 mg total) by mouth at bedtime. 30 tablet 5   No current facility-administered medications on file prior to visit.

## 2015-09-14 NOTE — Telephone Encounter (Signed)
Called and spoke with pt. Reviewed CY's recs and [facility] [address]. Rx Sent. Pt voiced understanding and had no further questions.

## 2015-09-21 ENCOUNTER — Telehealth: Payer: Self-pay | Admitting: Internal Medicine

## 2015-09-21 MED ORDER — AZITHROMYCIN 250 MG PO TABS: ORAL_TABLET | ORAL | Status: DC

## 2015-09-21 NOTE — Telephone Encounter (Signed)
Allergy list updated  Pt aware of recs and will call if not improving on Zpack  Rx sent to pharm

## 2015-09-21 NOTE — Telephone Encounter (Signed)
(  Pt put on Cefdinir on 09/14/15 by CY) Spoke with pt, stopped Cefdinir on Sunday d/t body aches, heart fluttering. Pt c/o prod cough with yellow-gray mucus, weakness, shortness of breath.  Denies fever, chest pain. [facility] [address]  Pt notes she is currently taking 20mg  prednisone daily.    CY please advise.  Thanks!

## 2015-09-21 NOTE — Telephone Encounter (Signed)
Mark chart allergic to cefdinid  Offer Zpak

## 2015-09-28 ENCOUNTER — Telehealth: Payer: Self-pay | Admitting: Internal Medicine

## 2015-09-28 NOTE — Telephone Encounter (Signed)
Spoke with pt. Her symptoms are not improving. We have given her 2 rounds of antibiotics over the phone. She has been scheduled to see TP tomorrow at 2:45pm. Nothing further was needed.

## 2015-09-29 ENCOUNTER — Ambulatory Visit (INDEPENDENT_AMBULATORY_CARE_PROVIDER_SITE_OTHER)
Admission: RE | Admit: 2015-09-29 | Discharge: 2015-09-29 | Disposition: A | Discharge status: Home / Self Care | Payer: Medicare Other | Source: Ambulatory Visit | Attending: Adult Health | Admitting: Adult Health

## 2015-09-29 ENCOUNTER — Encounter: Payer: Self-pay | Admitting: Adult Health

## 2015-09-29 ENCOUNTER — Ambulatory Visit (INDEPENDENT_AMBULATORY_CARE_PROVIDER_SITE_OTHER): Payer: Medicare Other | Admitting: Adult Health

## 2015-09-29 VITALS — BP 108/68 | HR 89 | Temp 99.5°F | Ht 61.0 in | Wt 145.6 lb

## 2015-09-29 DIAGNOSIS — J449 Chronic obstructive pulmonary disease, unspecified: Secondary | ICD-10-CM

## 2015-09-29 MED ORDER — AZITHROMYCIN 250 MG PO TABS: ORAL_TABLET | ORAL | Status: AC

## 2015-09-29 MED ORDER — LEVALBUTEROL HCL 0.63 MG/3ML IN NEBU
0.6300 mg | INHALATION_SOLUTION | Freq: Once | RESPIRATORY_TRACT | Status: AC
  Administered 2015-09-29: 0.63 mg via RESPIRATORY_TRACT

## 2015-09-29 NOTE — Patient Instructions (Signed)
Zpack take as directed.  Increase Prednisone 40mg  daily for 1 week then 30mg  daily for 1 week then back to 20mg  daily  Mucinex DM Twice daily  As needed  Cough/congestion  Please contact office for sooner follow up if symptoms do not improve or worsen or seek emergency care   Follow up Dr.  In 2 months and As needed

## 2015-09-29 NOTE — Progress Notes (Signed)
Subjective:    Patient ID: Wendy [name], female    DOB: 1961-07-04, 55 y.o.   MRN: [id number]  HPI 55 yo smoker with COPD and AR  Psoriasis on Humira   09/29/2015 Acute OV  Pt presents for an acute office visit.  Complains of 2 weeks of SOB, CP, tightness, cough with yellow mucus and sinus congestion with itchy/watery/red eyes. Called in [place] but only took for 4 days , could not tolerate.  The she was called in zpack . Pt has completed the Zpak with no improvement in symptoms. Pt's son currently has CAP. She denies hemoptiss , chest pain, orthopnea, edema, or n/v/d.  Has low grade fevers.  Has a lot of abx intolerances.  On prednisone 20mg  daily       Past Medical History  Diagnosis Date  . Bronchitis   . Asthma   . Seasonal allergies   . Poor dentition   . Alcoholism (HCC)   . COPD (chronic obstructive pulmonary disease) (HCC)   . Depression   . Oxygen dependent     home oxygen 3L/min  . Shortness of breath    Current Outpatient Prescriptions on File Prior to Visit  Medication Sig Dispense Refill  . Adalimumab (HUMIRA PEN-CROHNS STARTER) 40 MG/0.8ML PNKT Inject 40 mg into the skin every 14 (fourteen) days. 2 each   . ADVAIR DISKUS 500-50 MCG/DOSE AEPB INHALE 1 PUFF INTO THE LUNGS 2 TIMES DAILY 60 each 3  . albuterol (PROAIR HFA) 108 (90 BASE) MCG/ACT inhaler 2 puffs every 4 hours as needed- rescue 2 Inhaler prn  . albuterol (PROVENTIL) (2.5 MG/3ML) 0.083% nebulizer solution Take 2.5 mg by nebulization every 6 (six) hours as needed for wheezing or shortness of breath.    . ALPRAZolam (XANAX) 1 MG tablet 1 tab, three times daily as needed 90 tablet 5  . diphenhydrAMINE (BENADRYL) 25 mg capsule Take 25 mg by mouth daily as needed for itching.    . fluocinonide-emollient (LIDEX-E) 0.05 % cream Apply topically 2 (two) times daily.  1  . fluticasone (FLONASE) 50 MCG/ACT nasal spray Place 2 sprays into both nostrils daily. 16 g 11  . furosemide (LASIX) 20 MG tablet 1  tablet every other day as needed 10 tablet 0  . hydrOXYzine (ATARAX/VISTARIL) 25 MG tablet 1 (ONE) TABLET BY MOUTH THREE TIMES DAILY, AS NEEDED  0  . pantoprazole (PROTONIX) 20 MG tablet TAKE 1 TABLET (20 MG TOTAL) BY MOUTH DAILY BEFORE SUPPER. 30 tablet 5  . predniSONE (DELTASONE) 10 MG tablet TAKE 1 OR 2 TABLETS DAILY AS NEEDED 60 tablet 3  . theophylline (UNIPHYL) 400 MG 24 hr tablet Take 0.5 tablets (200 mg total) by mouth 2 (two) times daily with a meal. 30 tablet 11  . tiotropium (SPIRIVA HANDIHALER) 18 MCG inhalation capsule PLACE 1 CAPSULE INTO INHALER AND INHALE CONTENTS ONCE DAILY 30 capsule 11  . traZODone (DESYREL) 50 MG tablet Take 1 tablet (50 mg total) by mouth at bedtime. 30 tablet 5   No current facility-administered medications on file prior to visit.     Review of Systems Constitutional:   No  weight loss, night sweats,  Fevers, chills, + fatigue, or  lassitude.  HEENT:   No headaches,  Difficulty swallowing,  Tooth/dental problems, or  Sore throat,                No sneezing, itching, ear ache,  +nasal congestion, post nasal drip,   CV:  No chest pain,  Orthopnea,  PND, swelling in lower extremities, anasarca, dizziness, palpitations, syncope.   GI  No heartburn, indigestion, abdominal pain, nausea, vomiting, diarrhea, change in bowel habits, loss of appetite, bloody stools.   Resp:    No chest wall deformity  Skin: no rash or lesions.  GU: no dysuria, change in color of urine, no urgency or frequency.  No flank pain, no hematuria   MS:  No joint pain or swelling.  No decreased range of motion.  No back pain.  Psych:  No change in mood or affect. No depression or anxiety.  No memory loss.         Objective:   Physical Exam  Filed Vitals:   09/29/15 1507  BP: 108/68  Pulse: 89  Temp: 99.5 F (37.5 C)  TempSrc: Oral  Height: 5\' 1"  (1.549 m)  Weight: 145 lb 9.6 oz (66.044 kg)  SpO2: 93%   Body mass index is 27.53 kg/(m^2).   GEN: A/Ox3; pleasant ,  NAD , on o2   HEENT:  [place]/AT,  EACs-clear, TMs-wnl, NOSE-clear, THROAT-clear, no lesions, no postnasal drip or exudate noted.   NECK:  Supple w/ fair ROM; no JVD; normal carotid impulses w/o bruits; no thyromegaly or nodules palpated; no lymphadenopathy.  RESP  Clear  P & A; w/o, wheezes/ rales/ or rhonchi.no accessory muscle use, no dullness to percussion  CARD:  RRR, no m/r/g  , no peripheral edema, pulses intact, no cyanosis or clubbing.  GI:   Soft & nt; nml bowel sounds; no organomegaly or masses detected.  Musco: Warm bil, no deformities or joint swelling noted.   Neuro: alert, no focal deficits noted.    Skin: Warm, no lesions or rashes   09/29/2015 CXR chronic changes , nad  Reviewed independently      Assessment & Plan:

## 2015-09-29 NOTE — Assessment & Plan Note (Signed)
Slow to resolve flare  Multiple abx intolerances   Plan  Zpack take as directed.  Increase Prednisone 40mg  daily for 1 week then 30mg  daily for 1 week then back to 20mg  daily  Mucinex DM Twice daily  As needed  Cough/congestion  Please contact office for sooner follow up if symptoms do not improve or worsen or seek emergency care   Follow up Dr.  In 2 months and As needed

## 2015-10-03 ENCOUNTER — Ambulatory Visit: Payer: Medicare Other | Admitting: Internal Medicine

## 2015-10-18 ENCOUNTER — Other Ambulatory Visit: Payer: Self-pay | Admitting: Internal Medicine

## 2015-10-28 ENCOUNTER — Other Ambulatory Visit: Payer: Self-pay | Admitting: Internal Medicine

## 2015-11-07 ENCOUNTER — Other Ambulatory Visit: Payer: Self-pay | Admitting: Internal Medicine

## 2015-11-07 MED ORDER — TRAZODONE HCL 50 MG PO TABS: 50.0000 mg | ORAL_TABLET | Freq: Every day | ORAL | Status: DC

## 2015-11-07 NOTE — Telephone Encounter (Signed)
Per CY-okay to refill and paper fax has been signed and returned to [facility] [address] location.

## 2015-11-14 ENCOUNTER — Ambulatory Visit (INDEPENDENT_AMBULATORY_CARE_PROVIDER_SITE_OTHER): Payer: Medicare Other | Admitting: Internal Medicine

## 2015-11-14 ENCOUNTER — Encounter: Payer: Self-pay | Admitting: Internal Medicine

## 2015-11-14 VITALS — BP 130/78 | HR 104 | Temp 98.3°F | Ht 61.0 in | Wt 151.6 lb

## 2015-11-14 DIAGNOSIS — J9611 Chronic respiratory failure with hypoxia: Secondary | ICD-10-CM

## 2015-11-14 DIAGNOSIS — L409 Psoriasis, unspecified: Secondary | ICD-10-CM

## 2015-11-14 DIAGNOSIS — J449 Chronic obstructive pulmonary disease, unspecified: Secondary | ICD-10-CM

## 2015-11-14 DIAGNOSIS — R918 Other nonspecific abnormal finding of lung field: Secondary | ICD-10-CM | POA: Diagnosis not present

## 2015-11-14 MED ORDER — DOXYCYCLINE HYCLATE 100 MG PO TABS
ORAL_TABLET | ORAL | Status: DC
Start: 2015-11-14 — End: 2015-11-22

## 2015-11-14 MED ORDER — ALPRAZOLAM 1 MG PO TABS
ORAL_TABLET | ORAL | Status: DC
Start: 2015-11-14 — End: 2016-04-24

## 2015-11-14 NOTE — Patient Instructions (Addendum)
Ok to see how you do off theophylline  Handicapped Parking form done  Script for antibiotic doxycycline sent. Hold this in case you need for green sputum  Ok to increase you prednisone for a while if you need to  Xanax refilled

## 2015-11-14 NOTE — Progress Notes (Signed)
Patient ID: Wendy [name], female    DOB: [birth year], 55 y.o.   MRN: [id number] HPI 03/15/11- 40 yoF smoker followed for COPD, tobacco cessation support, rhinitis, complicated by hx anxiety Last here November 06, 2010- note reviewed. She has not smoked since December 2011.  Using her oxygen for the past week since she caught a cold with head and chest congestion, purulent sputum, hoarse. No fever or sore throat. Using her regular meds. Using her nebulizer regularly.  Had 7 teeth pulled in May.   08/16/11- 50 yoF smoker followed for COPD, tobacco cessation support, rhinitis, complicated by hx anxiety Has had flu vaccine. October 28 [facility] with acute bronchitis after catching a cold. Got better after treatment there but not well. We called in prednisone and a Z-Pak. She still feels some chest congestion, cough with clear mucus, feels tight. Denies fever, chest pain or blood. Has had oxygen for several months, used most of the time. Says she quit cigarettes this year by using an electronic cigarette with a mild insert. Nasal congestion especially left side. Occasional blood from left nostril. Chest x-ray: 07/15/2011-hyperinflation, no acute disease. I reviewed images.  02/25/12- 50 yoF smoker followed for COPD, tobacco cessation support, rhinitis, complicated by hx anxiety : c/o sob, congestion, and chest tightness. She is remaining off of cigarettes. Her COPD assessment test (CAT) score is 30/40 She had done well for the last several months as her last visit. Just 3 days ago and acute illness began as a gastroenteritis followed by shortness of breath, nonproductive cough and wheeze. She increased her oxygen. Using her nebulizer. Feels better today.  10/16/12- 51 yoF formersmoker followed for COPD, tobacco cessation support, rhinitis, complicated by hx anxiety follow-up: Pt c/o increased SOB with activity that has gradually been worse since last OV.  Antibiotics 2 or 3  times for bronchitis since last here. Needs flu vaccine. Oxygen 2-3 L/Advanced. Nebulizer helps. Not acute-this is baseline for her.  02/13/13- 70 yoF former smoker followed for COPD, tobacco cessation support, rhinitis, complicated by hx anxiety COPD w/ PFT. Pt reports breathing has been terrible. has a little occasional dry cough, chest tx but no wheezing.  Stays short of breath. Had a Z-Pak 2 weeks ago for bronchitis.still feels tight if she takes a deep breath. Rarely any mucus. No chest pain or ankle edema. O2 2-3L/ Advanced. After discussion, she is not interested in lung transplant. CXR 3/13 /14 IMPRESSION:  Old granulomatous disease and chronic hyperinflation.  No acute abnormalities.  Original Report Authenticated By: Ulyses Southward, M.D. PFT 02/13/2013-very severe obstructive airways disease with insignificant response to bronchodilator, diffusion severely reduced. Patient was unable to perform lung volume test. FVC 1.23/39%, FEV1 0.48/19%, FEV1/FVC 0.39/49%, FEF 25-75% 0.18/7%, DLCO 46%.  03/12/13- 28 yoF former smoker followed for COPD, tobacco cessation support, rhinitis, complicated by hx anxiety FOLLOWS FOR: pt wanted to discuss lung transplant- was told about it on 02-13-13. Wants more information. Remains off cigarettes.   Daughter here Feeling stable with no acute issues. Continuous O2 2.5L/ Advanced. We discussed progression of lung disease, outlined limitations of lung transplant and common criteria. Her disease is severe enough that she may be in her transplant window.  Preliminary discussion of end of life.  08/07/13- 43 yoF former smoker followed for COPD, tobacco cessation support, rhinitis, complicated by hx anxiety FOLLOWS FOR: having hard time with breathing; put on abx and prednisone. Finishing pred and doxy. Feels need to extend course- still  green with cough and chest is tight. Incidental mention of extensive rash- onset 2 years ago. CXR 11/27/12 IMPRESSION:  Old  granulomatous disease and chronic hyperinflation.  No acute abnormalities.  Original Report Authenticated By: Ulyses Southward, M.D.  10/15/13- 42 yoF former smoker followed for COPD, tobacco cessation support, rhinitis, complicated by hx anxiety                Daughter here Post hospital- 1/19-10/24/13- acute on chronic respiratory failure, COPD exacerbation with right upper lobe community pneumonia, diastolic dysfunction.  O2 2-3L/ Advanced Feeling much better. Mild residual dry cough as she tapers prednisone. Not smoking. CT chest 10/08/13 IMPRESSION:  1. No evidence of pulmonary embolus.  2. Focal right upper lobe pneumonia noted.  3. Few scattered small pulmonary nodules seen, measuring up to 5 mm  in size. If the patient is at high risk for bronchogenic carcinoma,  follow-up chest CT at 6-12 months is recommended. If the patient is  at low risk for bronchogenic carcinoma, follow-up chest CT at 12  months is recommended. This recommendation follows the consensus  statement: Guidelines for Management of Small Pulmonary Nodules  Detected on CT Scans: A Statement from the Fleischner Society as  published in Radiology 2005;237:395-400.  Electronically Signed  By: Roanna Raider M.D.  On: 10/08/2013 05:07  12/07/13- 61 yoF former smoker followed for COPD, tobacco cessation support, rhinitis, complicated by hx anxiety                Daughter here FOLLOWS FOR: feels like she is having a flare up and wonders if she is getting a cold as well-if abx given please print for her to take with her to pharmacy. Continues O2 3 L/Advanced, prednisone 10 mg daily maintenance Head and chest congestion/chest cold x3 days. Dry cough, no fever. Requalified for oxygen.  07/15/14-53 yoF former smoker followed for COPD, tobacco cessation support, rhinitis, complicated by hx anxiety, psoriasis                 Follows For:  Pt hospitalized at [facility] on 07/01/14 for copd, swelling and severe psoriasis break out - SOB on  exertion better than it was - Feels like a knife in center of back - Worse with deep breath -Swelling in feet and ankles -  Started on Cyclosporin 4 days ago for psoriasis, Lasix for edema Prednisone taper now down to 5 mg alternating with 10 every other day. Sharp pains in mid back without radiation started yesterday CXR 07/01/14 IMPRESSION:  No pulmonary emboli or other acute abnormality. Calcified granulomas  in both lungs.  Electronically Signed  By: Geanie Cooley M.D.  On: 07/01/2014 15:33  11/01/14- 43 yoF former smoker followed for COPD, tobacco cessation support, rhinitis, complicated by hx anxiety, psoriasis  FOLLOWS FOR: Pt states she can tell an increase in SOB and getting worse. Blames weight gain for dyspnea, w/o recent infection. Now taking prednisone 20 mg daily for breathing. Had neg CTa last fall. Has been on cyclosporin from dermatologist for psoriasis with much better control of that. Has not needed regular lasix for fluid retention. Nebulizer seems to make her worse. Occ wakes from sleep with heart burn. O2 2-3L/ Advanced CTa chest 07/01/14 IMPRESSION: No pulmonary emboli or other acute abnormality. Calcified granulomas in both lungs. Electronically Signed  By: Geanie Cooley M.D.  On: 07/01/2014 15:33  03/02/15- 15 yoF former smoker followed for COPD, tobacco cessation support, rhinitis, complicated by hx anxiety, psoriasis  FOLLOWS FOR: Pt states her breathing  is unchanged since the last visit. She has been taking 5-10 mg pred since last visit. She is using proair twice per day on average and only uses neb about once per month. Oxygen 2-3 L/Advanced. She feels her breathing is more comfortable but it has been in a long time.  Psoriasis treatment changed from methotrexate to Humira. There is concern of interaction with theophylline. Not clear that theophylline has had much benefit for her lungs.  11/14/2015-55 year old female former smoker followed for COPD, tobacco  cessation support, rhinitis, complicated by history anxiety, psoriasis/ Humira O2 2-3 liters/Advanced Saw NP 09/29/15- acute visit sinusitis and bronchitis. Given Z-Pak and prednisone burst, tapering back to 20 mg daily maintenance. FOLLOWS FOR: per TP visit on 09/29/15; pt states she can feel like a "cold" is coming on-started yesterday-chest congestion, cough-wet,SOB and wheezing, chills as well-no fever documented  1 month of intermittent sharp left upper anterior chest wall pain radiating to back and scapula. Maintenance prednisone now at 10 mg daily, took extra last night at onset of her cold.  Review of Systems-per HPI Constitutional:   No-   weight loss, night sweats, fevers, chills, fatigue, lassitude. HEENT:   No-  headaches, difficulty swallowing, tooth/dental problems, sore throat,       No-  sneezing, itching, ear ache, +nasal congestion, post nasal drip,  CV:  +  chest pain, orthopnea, PND, swelling in lower extremities, anasarca,  dizziness, palpitations Resp: +Persistent shortness of breath with exertion or at rest.              No-productive cough,  + non-productive cough,  No- coughing up of blood.           No-change in color of mucus.  No- wheezing.   Skin: +psoriasis rash GI:  + heartburn, No- indigestion, abdominal pain, nausea, vomiting,  GU:  MS:  No-   joint pain or swelling.   Neuro-     nothing unusual Psych:  No- change in mood or affect. No depression or anxiety.  No memory loss.   Objective:   Physical Exam General- Alert, Oriented, Affect-appropriate, Distress- none acute. O2 2 L Skin- +Dry with no visible plaques Lymphadenopathy- none Head- atraumatic            Eyes- Gross vision intact, PERRLA, conjunctivae clear secretions            Ears- Hearing, canals-normal            Nose- + sniffing, turbinate edema, + External and Septal dev, mucus, No polyps, erosion,                             Throat- Mallampati II , mucosa clear , drainage- none, tonsils-  atrophic Neck- flexible , trachea midline, no stridor , thyroid nl, carotid no bruit Chest - symmetrical excursion , unlabored           Heart/CV- RRR , no murmur , no gallop  , no rub, nl s1 s2                           - JVD- none , edema- none, stasis changes- none, varices- none           Lung- +very distant, wheeze-none, cough- none, dullness-none,  rub-none.                         Chest  wall-  Abd-  Br/ Gen/ Rectal- Not done, not indicated Extrem- cyanosis- none, clubbing, none, atrophy- none, strength- nl Neuro- grossly intact to observation

## 2015-11-16 ENCOUNTER — Other Ambulatory Visit: Payer: Self-pay | Admitting: Internal Medicine

## 2015-11-20 DIAGNOSIS — J9611 Chronic respiratory failure with hypoxia: Secondary | ICD-10-CM | POA: Insufficient documentation

## 2015-11-20 NOTE — Assessment & Plan Note (Signed)
CT chest October, 2015-calcified granulomas

## 2015-11-20 NOTE — Assessment & Plan Note (Signed)
Remains dependent on supplemental oxygen Plan-handicap parking renewed

## 2015-11-20 NOTE — Assessment & Plan Note (Signed)
New acute viral pattern URI/tracheobronchitis Plan-doxycycline to hold, prednisone taper to hold

## 2015-11-20 NOTE — Assessment & Plan Note (Signed)
Discussed immune system impact of Humira plus prednisone

## 2015-11-22 ENCOUNTER — Emergency Department (HOSPITAL_COMMUNITY): Payer: Medicare Other

## 2015-11-22 ENCOUNTER — Inpatient Hospital Stay (HOSPITAL_COMMUNITY)
Admission: EM | Admit: 2015-11-22 | Discharge: 2015-11-30 | DRG: 871 | Disposition: A | Discharge status: Home Health | Payer: Medicare Other | Attending: Infectious Disease | Admitting: Infectious Disease

## 2015-11-22 ENCOUNTER — Encounter (HOSPITAL_COMMUNITY): Payer: Self-pay | Admitting: Emergency Medicine

## 2015-11-22 DIAGNOSIS — F329 Major depressive disorder, single episode, unspecified: Secondary | ICD-10-CM | POA: Diagnosis present

## 2015-11-22 DIAGNOSIS — I5032 Chronic diastolic (congestive) heart failure: Secondary | ICD-10-CM | POA: Diagnosis not present

## 2015-11-22 DIAGNOSIS — J189 Pneumonia, unspecified organism: Secondary | ICD-10-CM | POA: Diagnosis present

## 2015-11-22 DIAGNOSIS — G4733 Obstructive sleep apnea (adult) (pediatric): Secondary | ICD-10-CM | POA: Insufficient documentation

## 2015-11-22 DIAGNOSIS — E662 Morbid (severe) obesity with alveolar hypoventilation: Secondary | ICD-10-CM | POA: Diagnosis not present

## 2015-11-22 DIAGNOSIS — L409 Psoriasis, unspecified: Secondary | ICD-10-CM | POA: Diagnosis not present

## 2015-11-22 DIAGNOSIS — Z888 Allergy status to other drugs, medicaments and biological substances status: Secondary | ICD-10-CM

## 2015-11-22 DIAGNOSIS — R059 Cough, unspecified: Secondary | ICD-10-CM | POA: Insufficient documentation

## 2015-11-22 DIAGNOSIS — Z9981 Dependence on supplemental oxygen: Secondary | ICD-10-CM

## 2015-11-22 DIAGNOSIS — J9622 Acute and chronic respiratory failure with hypercapnia: Secondary | ICD-10-CM | POA: Diagnosis present

## 2015-11-22 DIAGNOSIS — K219 Gastro-esophageal reflux disease without esophagitis: Secondary | ICD-10-CM | POA: Diagnosis present

## 2015-11-22 DIAGNOSIS — A419 Sepsis, unspecified organism: Secondary | ICD-10-CM | POA: Diagnosis present

## 2015-11-22 DIAGNOSIS — R079 Chest pain, unspecified: Secondary | ICD-10-CM | POA: Diagnosis not present

## 2015-11-22 DIAGNOSIS — F419 Anxiety disorder, unspecified: Secondary | ICD-10-CM | POA: Diagnosis not present

## 2015-11-22 DIAGNOSIS — J440 Chronic obstructive pulmonary disease with acute lower respiratory infection: Secondary | ICD-10-CM | POA: Diagnosis present

## 2015-11-22 DIAGNOSIS — J449 Chronic obstructive pulmonary disease, unspecified: Secondary | ICD-10-CM | POA: Insufficient documentation

## 2015-11-22 DIAGNOSIS — F410 Panic disorder [episodic paroxysmal anxiety] without agoraphobia: Secondary | ICD-10-CM | POA: Diagnosis present

## 2015-11-22 DIAGNOSIS — Z825 Family history of asthma and other chronic lower respiratory diseases: Secondary | ICD-10-CM | POA: Diagnosis not present

## 2015-11-22 DIAGNOSIS — F4323 Adjustment disorder with mixed anxiety and depressed mood: Secondary | ICD-10-CM | POA: Diagnosis not present

## 2015-11-22 DIAGNOSIS — F1020 Alcohol dependence, uncomplicated: Secondary | ICD-10-CM | POA: Diagnosis present

## 2015-11-22 DIAGNOSIS — R0600 Dyspnea, unspecified: Secondary | ICD-10-CM | POA: Diagnosis not present

## 2015-11-22 DIAGNOSIS — D899 Disorder involving the immune mechanism, unspecified: Secondary | ICD-10-CM

## 2015-11-22 DIAGNOSIS — Z515 Encounter for palliative care: Secondary | ICD-10-CM | POA: Insufficient documentation

## 2015-11-22 DIAGNOSIS — J9621 Acute and chronic respiratory failure with hypoxia: Secondary | ICD-10-CM | POA: Diagnosis not present

## 2015-11-22 DIAGNOSIS — Z885 Allergy status to narcotic agent status: Secondary | ICD-10-CM | POA: Diagnosis not present

## 2015-11-22 DIAGNOSIS — J9602 Acute respiratory failure with hypercapnia: Secondary | ICD-10-CM | POA: Diagnosis not present

## 2015-11-22 DIAGNOSIS — Z79899 Other long term (current) drug therapy: Secondary | ICD-10-CM

## 2015-11-22 DIAGNOSIS — J9612 Chronic respiratory failure with hypercapnia: Secondary | ICD-10-CM | POA: Diagnosis not present

## 2015-11-22 DIAGNOSIS — E876 Hypokalemia: Secondary | ICD-10-CM | POA: Diagnosis present

## 2015-11-22 DIAGNOSIS — Z7952 Long term (current) use of systemic steroids: Secondary | ICD-10-CM

## 2015-11-22 DIAGNOSIS — Z7189 Other specified counseling: Secondary | ICD-10-CM | POA: Insufficient documentation

## 2015-11-22 DIAGNOSIS — J310 Chronic rhinitis: Secondary | ICD-10-CM | POA: Diagnosis not present

## 2015-11-22 DIAGNOSIS — Z881 Allergy status to other antibiotic agents status: Secondary | ICD-10-CM

## 2015-11-22 DIAGNOSIS — R05 Cough: Secondary | ICD-10-CM

## 2015-11-22 DIAGNOSIS — J9620 Acute and chronic respiratory failure, unspecified whether with hypoxia or hypercapnia: Secondary | ICD-10-CM

## 2015-11-22 DIAGNOSIS — Z87891 Personal history of nicotine dependence: Secondary | ICD-10-CM | POA: Diagnosis not present

## 2015-11-22 DIAGNOSIS — J441 Chronic obstructive pulmonary disease with (acute) exacerbation: Secondary | ICD-10-CM | POA: Diagnosis not present

## 2015-11-22 DIAGNOSIS — R0789 Other chest pain: Secondary | ICD-10-CM | POA: Diagnosis not present

## 2015-11-22 DIAGNOSIS — D849 Immunodeficiency, unspecified: Secondary | ICD-10-CM | POA: Insufficient documentation

## 2015-11-22 DIAGNOSIS — R0602 Shortness of breath: Secondary | ICD-10-CM | POA: Diagnosis not present

## 2015-11-22 HISTORY — DX: Stress incontinence (female) (male): N39.3

## 2015-11-22 HISTORY — DX: Psoriasis, unspecified: L40.9

## 2015-11-22 LAB — CBC
HCT: 36.5 % (ref 36.0–46.0)
Hemoglobin: 11.4 g/dL — ABNORMAL LOW (ref 12.0–15.0)
MCH: 29.6 pg (ref 26.0–34.0)
MCHC: 31.2 g/dL (ref 30.0–36.0)
MCV: 94.8 fL (ref 78.0–100.0)
PLATELETS: 216 10*3/uL (ref 150–400)
RBC: 3.85 MIL/uL — AB (ref 3.87–5.11)
RDW: 14 % (ref 11.5–15.5)
WBC: 9.5 10*3/uL (ref 4.0–10.5)

## 2015-11-22 LAB — BASIC METABOLIC PANEL
Anion gap: 11 (ref 5–15)
BUN: 6 mg/dL (ref 6–20)
CALCIUM: 9 mg/dL (ref 8.9–10.3)
CO2: 36 mmol/L — ABNORMAL HIGH (ref 22–32)
CREATININE: 0.86 mg/dL (ref 0.44–1.00)
Chloride: 92 mmol/L — ABNORMAL LOW (ref 101–111)
GFR calc non Af Amer: >60 mL/min (ref >60)
Glucose, Bld: 109 mg/dL — ABNORMAL HIGH (ref 65–99)
Potassium: 3.2 mmol/L — ABNORMAL LOW (ref 3.5–5.1)
SODIUM: 139 mmol/L (ref 135–145)

## 2015-11-22 LAB — URINALYSIS, ROUTINE W REFLEX MICROSCOPIC
BILIRUBIN URINE: NEGATIVE
GLUCOSE, UA: NEGATIVE mg/dL
HGB URINE DIPSTICK: NEGATIVE
Ketones, ur: NEGATIVE mg/dL
Leukocytes, UA: NEGATIVE
Nitrite: NEGATIVE
Protein, ur: NEGATIVE mg/dL
SPECIFIC GRAVITY, URINE: 1.004 — AB (ref 1.005–1.030)
pH: 5.5 (ref 5.0–8.0)

## 2015-11-22 LAB — I-STAT CG4 LACTIC ACID, ED
Lactic Acid, Venous: 1.5 mmol/L (ref 0.5–2.0)
Lactic Acid, Venous: 0.33 mmol/L — ABNORMAL LOW (ref 0.5–2.0)

## 2015-11-22 LAB — I-STAT ARTERIAL BLOOD GAS, ED
ACID-BASE EXCESS: 6 mmol/L — AB (ref 0.0–2.0)
BICARBONATE: 34.8 meq/L — AB (ref 20.0–24.0)
O2 SAT: 95 %
TCO2: 37 mmol/L (ref 0–100)
pCO2 arterial: 77 mmHg (ref 35.0–45.0)
pH, Arterial: 7.263 — ABNORMAL LOW (ref 7.350–7.450)
pO2, Arterial: 88 mmHg (ref 80.0–100.0)

## 2015-11-22 LAB — PROCALCITONIN: Procalcitonin: <0.1 ng/mL

## 2015-11-22 LAB — ETHANOL: Alcohol, Ethyl (B): <5 mg/dL (ref <5)

## 2015-11-22 LAB — HEPATIC FUNCTION PANEL
ALBUMIN: 3.7 g/dL (ref 3.5–5.0)
ALK PHOS: 40 U/L (ref 38–126)
ALT: 14 U/L (ref 14–54)
AST: 20 U/L (ref 15–41)
BILIRUBIN TOTAL: 0.2 mg/dL — AB (ref 0.3–1.2)
Total Protein: 6.3 g/dL — ABNORMAL LOW (ref 6.5–8.1)

## 2015-11-22 LAB — TROPONIN I: Troponin I: 0.03 ng/mL (ref <0.031)

## 2015-11-22 LAB — INFLUENZA PANEL BY PCR (TYPE A & B)
H1N1 flu by pcr: NOT DETECTED
INFLBPCR: NEGATIVE
Influenza A By PCR: NEGATIVE

## 2015-11-22 LAB — I-STAT TROPONIN, ED: TROPONIN I, POC: 0.02 ng/mL (ref 0.00–0.08)

## 2015-11-22 LAB — MAGNESIUM: Magnesium: 1.3 mg/dL — ABNORMAL LOW (ref 1.7–2.4)

## 2015-11-22 LAB — PROTIME-INR
INR: 0.93 (ref 0.00–1.49)
PROTHROMBIN TIME: 12.7 s (ref 11.6–15.2)

## 2015-11-22 MED ORDER — PANTOPRAZOLE SODIUM 40 MG PO TBEC
40.0000 mg | DELAYED_RELEASE_TABLET | Freq: Every morning | ORAL | Status: DC
  Filled 2015-11-22: qty 1

## 2015-11-22 MED ORDER — VANCOMYCIN HCL IN DEXTROSE 750-5 MG/150ML-% IV SOLN
750.0000 mg | Freq: Two times a day (BID) | INTRAVENOUS | Status: DC
  Administered 2015-11-22: 750 mg via INTRAVENOUS
  Filled 2015-11-22 (×3): qty 150

## 2015-11-22 MED ORDER — MAGNESIUM SULFATE 2 GM/50ML IV SOLN
2.0000 g | Freq: Once | INTRAVENOUS | Status: AC
  Administered 2015-11-22: 2 g via INTRAVENOUS
  Filled 2015-11-22: qty 50

## 2015-11-22 MED ORDER — SODIUM CHLORIDE 0.9 % IV BOLUS (SEPSIS)
1000.0000 mL | INTRAVENOUS | Status: AC
  Administered 2015-11-22 (×2): 1000 mL via INTRAVENOUS

## 2015-11-22 MED ORDER — METHYLPREDNISOLONE SODIUM SUCC 125 MG IJ SOLR
60.0000 mg | Freq: Four times a day (QID) | INTRAMUSCULAR | Status: DC
  Administered 2015-11-22 – 2015-11-24 (×8): 60 mg via INTRAVENOUS
  Filled 2015-11-22 (×8): qty 2

## 2015-11-22 MED ORDER — POTASSIUM CHLORIDE CRYS ER 20 MEQ PO TBCR
40.0000 meq | EXTENDED_RELEASE_TABLET | Freq: Once | ORAL | Status: AC
  Administered 2015-11-22: 40 meq via ORAL
  Filled 2015-11-22: qty 2

## 2015-11-22 MED ORDER — DEXTROSE 5 % IV SOLN
2.0000 g | Freq: Once | INTRAVENOUS | Status: AC
  Administered 2015-11-22: 2 g via INTRAVENOUS
  Filled 2015-11-22: qty 2

## 2015-11-22 MED ORDER — ALBUTEROL SULFATE HFA 108 (90 BASE) MCG/ACT IN AERS
2.0000 | INHALATION_SPRAY | RESPIRATORY_TRACT | Status: DC | PRN
  Filled 2015-11-22: qty 6.7

## 2015-11-22 MED ORDER — ALBUTEROL SULFATE (2.5 MG/3ML) 0.083% IN NEBU
2.5000 mg | INHALATION_SOLUTION | RESPIRATORY_TRACT | Status: DC | PRN
  Administered 2015-11-23: 2.5 mg via RESPIRATORY_TRACT

## 2015-11-22 MED ORDER — ALPRAZOLAM 0.25 MG PO TABS
1.0000 mg | ORAL_TABLET | Freq: Once | ORAL | Status: AC
  Administered 2015-11-22: 1 mg via ORAL
  Filled 2015-11-22: qty 4

## 2015-11-22 MED ORDER — SODIUM CHLORIDE 0.9 % IV BOLUS (SEPSIS)
1000.0000 mL | Freq: Once | INTRAVENOUS | Status: AC
  Administered 2015-11-22: 1000 mL via INTRAVENOUS

## 2015-11-22 MED ORDER — DM-GUAIFENESIN ER 30-600 MG PO TB12
1.0000 | ORAL_TABLET | Freq: Two times a day (BID) | ORAL | Status: DC
  Administered 2015-11-22 – 2015-11-30 (×15): 1 via ORAL
  Filled 2015-11-22 (×15): qty 1

## 2015-11-22 MED ORDER — IPRATROPIUM-ALBUTEROL 0.5-2.5 (3) MG/3ML IN SOLN
3.0000 mL | Freq: Once | RESPIRATORY_TRACT | Status: AC
  Administered 2015-11-22: 3 mL via RESPIRATORY_TRACT
  Filled 2015-11-22: qty 3

## 2015-11-22 MED ORDER — SODIUM CHLORIDE 0.9 % IV SOLN
INTRAVENOUS | Status: AC
  Administered 2015-11-22: 15:00:00 via INTRAVENOUS

## 2015-11-22 MED ORDER — SODIUM CHLORIDE 0.9 % IV BOLUS (SEPSIS)
500.0000 mL | INTRAVENOUS | Status: AC
  Administered 2015-11-22: 500 mL via INTRAVENOUS

## 2015-11-22 MED ORDER — DEXTROSE 5 % IV SOLN
2.0000 g | Freq: Three times a day (TID) | INTRAVENOUS | Status: DC
  Administered 2015-11-22 (×2): 2 g via INTRAVENOUS
  Filled 2015-11-22 (×4): qty 2

## 2015-11-22 MED ORDER — FLUTICASONE PROPIONATE 50 MCG/ACT NA SUSP
1.0000 | Freq: Every day | NASAL | Status: DC
  Administered 2015-11-23: 1 via NASAL
  Filled 2015-11-22 (×2): qty 16

## 2015-11-22 MED ORDER — METHYLPREDNISOLONE SODIUM SUCC 125 MG IJ SOLR
125.0000 mg | Freq: Once | INTRAMUSCULAR | Status: AC
  Administered 2015-11-22: 125 mg via INTRAVENOUS
  Filled 2015-11-22: qty 2

## 2015-11-22 MED ORDER — VANCOMYCIN HCL IN DEXTROSE 1-5 GM/200ML-% IV SOLN
1000.0000 mg | Freq: Once | INTRAVENOUS | Status: AC
  Administered 2015-11-22: 1000 mg via INTRAVENOUS
  Filled 2015-11-22: qty 200

## 2015-11-22 MED ORDER — ACETAMINOPHEN 325 MG PO TABS
650.0000 mg | ORAL_TABLET | Freq: Once | ORAL | Status: AC
  Administered 2015-11-22: 650 mg via ORAL
  Filled 2015-11-22: qty 2

## 2015-11-22 MED ORDER — ONDANSETRON HCL 4 MG/2ML IJ SOLN
4.0000 mg | Freq: Four times a day (QID) | INTRAMUSCULAR | Status: DC | PRN
  Administered 2015-11-22: 4 mg via INTRAVENOUS
  Filled 2015-11-22: qty 2

## 2015-11-22 MED ORDER — ALPRAZOLAM 0.5 MG PO TABS
0.5000 mg | ORAL_TABLET | Freq: Three times a day (TID) | ORAL | Status: DC | PRN
  Administered 2015-11-22 – 2015-11-25 (×8): 0.5 mg via ORAL
  Filled 2015-11-22 (×4): qty 1
  Filled 2015-11-22: qty 2
  Filled 2015-11-22 (×3): qty 1

## 2015-11-22 MED ORDER — HEPARIN SODIUM (PORCINE) 5000 UNIT/ML IJ SOLN
5000.0000 [IU] | Freq: Three times a day (TID) | INTRAMUSCULAR | Status: DC
  Administered 2015-11-22 – 2015-11-30 (×21): 5000 [IU] via SUBCUTANEOUS
  Filled 2015-11-22 (×22): qty 1

## 2015-11-22 MED ORDER — TRAZODONE HCL 50 MG PO TABS
50.0000 mg | ORAL_TABLET | Freq: Every day | ORAL | Status: DC
  Administered 2015-11-22: 50 mg via ORAL
  Filled 2015-11-22: qty 1

## 2015-11-22 MED ORDER — IPRATROPIUM-ALBUTEROL 0.5-2.5 (3) MG/3ML IN SOLN
3.0000 mL | Freq: Four times a day (QID) | RESPIRATORY_TRACT | Status: DC
  Administered 2015-11-22 – 2015-11-23 (×2): 3 mL via RESPIRATORY_TRACT
  Filled 2015-11-22 (×2): qty 3

## 2015-11-22 NOTE — Progress Notes (Signed)
Pharmacy Antibiotic Note  Wendy [name] is a 55 y.o. female admitted on 11/22/2015 with pneumonia.  Pharmacy has been consulted for aztreonam and vancomycin dosing. Pt presents with CP, productive cough and SOB over the past week.  Pt received vancomycin 1g and aztreonam 2g IV once in the ED.  Plan: Vancomycin 750mg  IV every 12 hours.  Goal trough 15-20 mcg/mL.  Aztreonam 2g IV q8h Monitor culture data, renal function and clinical course VT at SS prn  Weight: 152 lb (68.947 kg)  Temp (24hrs), Avg:102.4 F (39.1 C), Min:102.4 F (39.1 C), Max:102.4 F (39.1 C)   Recent Labs Lab 11/22/15 0811  LATICACIDVEN 1.50    CrCl cannot be calculated (Patient has no serum creatinine result on file.).    Allergies  Allergen Reactions  . Augmentin [Amoxicillin-Pot Clavulanate] Nausea Only  . Cefdinir Other (See Comments)    Aches, "heart fluttering"  . Codeine Nausea Only  . Fexofenadine Nausea Only    allegra  . Other     PAIN MEDICATIONS-nausea    Antimicrobials this admission: Azactam 3/7 >>  Vanc 3/7 >>   Dose adjustments this admission: n/a  Microbiology results: 3/ BCx:   UCx:    Sputum:    MRSA PCR:    5/7. Arlean Hopping, PharmD, BCPS Clinical Pharmacist Pager [phone] 11/22/2015 8:33 AM

## 2015-11-22 NOTE — ED Notes (Signed)
Patient transported to X-ray 

## 2015-11-22 NOTE — ED Notes (Signed)
Attempted report 

## 2015-11-22 NOTE — ED Notes (Signed)
PT arrives via [facility] for c/o chest pain that began yesterday. Pt reports pain became worse today, pt was given 324mg  asa, 1 sl nitro pta, pts pain 0/10 at this time. Pt also given albuterol/atrovent neb pta.

## 2015-11-22 NOTE — ED Provider Notes (Signed)
CSN: [id number]     Arrival date & time 11/22/15  0734 History   First MD Initiated Contact with Patient 11/22/15 [phone]     Chief Complaint  Patient presents with  . Chest Pain     (Consider location/radiation/quality/duration/timing/severity/associated sxs/prior Treatment) HPI   Blood pressure 100/62, pulse 132, temperature 102.4 F (39.1 C), resp. rate 23, weight 68.947 kg, SpO2 97 %.  Wendy [name] is a 55 y.o. female complaining of productive cough, which is pruritic chest pain and shortness of breath worsening over the course of one week. Patient took her temperature last night at 3 AM and it was 104.0. She took Mucinex with no relief. She had a single episode of nonbloody, nonbilious, non-coffee ground emesis. Patient has history of COPD and is on 3 L of oxygen at all time. EMS gave aspirin, nitroglycerin in route and chest pain resolved. No history of ACS however she does have a history of CHF, she doesn't take any diuretics regularly. Patient denies increasing peripheral edema, orthopnea, PND. She does endorse joint pain consistent with her plaque psoriasis for which she takes Humira injections for. She did receive a flu shot this year, she denies recent travel, sick contacts, cervicalgia she endorses global headache. Patient states she was on the lung transplant list but took herself off, she is a former smoker and former alcoholic.  No hospitalizations in the last 2 months and patient does not live in SNF. Patient states that she feels anxious and would like her 1 mg Xanax she takes for anxiety  Pulmonologist: Young  Past Medical History  Diagnosis Date  . Bronchitis   . Asthma   . Seasonal allergies   . Poor dentition   . Alcoholism (HCC)   . COPD (chronic obstructive pulmonary disease) (HCC)   . Depression   . Oxygen dependent     home oxygen 3L/min  . Shortness of breath    Past Surgical History  Procedure Laterality Date  . Cesarean section      x 2  . Tubal  ligation     Family History  Problem Relation Age of Onset  . Lung cancer Father   . COPD Father    Social History  Substance Use Topics  . Smoking status: Former Smoker -- 1.00 packs/day for 40 years    Types: Cigarettes    Start date: 11/10/1970    Quit date: 09/17/2013  . Smokeless tobacco: Never Used  . Alcohol Use: No     Comment: 5 cans of beer daily  " 06/2014 drinks occasional ".  3s/2/16 No longer drink   OB History    No data available     Review of Systems  10 systems reviewed and found to be negative, except as noted in the HPI.   Allergies  Augmentin; Cefdinir; Codeine; Fexofenadine; and Other  Home Medications   Prior to Admission medications   Medication Sig Start Date End Date Taking? Authorizing Provider  Adalimumab (HUMIRA PEN-CROHNS STARTER) 40 MG/0.8ML PNKT Inject 40 mg into the skin every 14 (fourteen) days. 05/17/15   Josalyn Funches, MD  ADVAIR DISKUS 500-50 MCG/DOSE AEPB INHALE 1 PUFF INTO THE LUNGS 2 TIMES DAILY 08/29/15   Waymon Budge, MD  albuterol (PROAIR HFA) 108 (90 BASE) MCG/ACT inhaler 2 puffs every 4 hours as needed- rescue 03/02/15   Waymon Budge, MD  albuterol (PROVENTIL) (2.5 MG/3ML) 0.083% nebulizer solution Take 2.5 mg by nebulization every 6 (six) hours as needed for wheezing or  shortness of breath.    Historical Provider, MD  ALPRAZolam Prudy Feeler) 1 MG tablet 1 tab, three times daily as needed 11/14/15   Waymon Budge, MD  diphenhydrAMINE (BENADRYL) 25 mg capsule Take 25 mg by mouth daily as needed for itching.    Historical Provider, MD  doxycycline (VIBRA-TABS) 100 MG tablet 2 today then one daily 11/14/15   Waymon Budge, MD  fluocinonide-emollient (LIDEX-E) 0.05 % cream Apply topically 2 (two) times daily. 05/17/15   Historical Provider, MD  fluticasone (FLONASE) 50 MCG/ACT nasal spray PLACE 2 SPRAYS INTO BOTH NOSTRILS DAILY. 10/28/15   Waymon Budge, MD  furosemide (LASIX) 20 MG tablet 1 tablet every other day as needed  07/30/14   Waymon Budge, MD  hydrOXYzine (ATARAX/VISTARIL) 25 MG tablet 1 (ONE) TABLET BY MOUTH THREE TIMES DAILY, AS NEEDED 05/18/15   Historical Provider, MD  pantoprazole (PROTONIX) 20 MG tablet TAKE 1 TABLET (20 MG TOTAL) BY MOUTH DAILY BEFORE SUPPER. 08/24/15   Waymon Budge, MD  predniSONE (DELTASONE) 10 MG tablet TAKE 1 OR 2 TABLETS DAILY AS NEEDED 11/16/15   Waymon Budge, MD  SPIRIVA HANDIHALER 18 MCG inhalation capsule PLACE 1 CAPSULE (18 MCG TOTAL) INTO INHALER AND INHALE DAILY. 10/18/15   Waymon Budge, MD  theophylline (UNIPHYL) 400 MG 24 hr tablet Take 0.5 tablets (200 mg total) by mouth 2 (two) times daily with a meal. 12/07/14   Waymon Budge, MD  traZODone (DESYREL) 50 MG tablet Take 1 tablet (50 mg total) by mouth at bedtime. 11/07/15   Waymon Budge, MD   BP 100/62 mmHg  Pulse 132  Temp(Src) 102.4 F (39.1 C)  Resp 23  Wt 68.947 kg  SpO2 97% Physical Exam  Constitutional: She is oriented to person, place, and time. She appears well-developed and well-nourished. No distress.  HENT:  Head: Normocephalic.  Mouth/Throat: Oropharynx is clear and moist.  Dry MM  Eyes: Conjunctivae and EOM are normal.  Neck: Normal range of motion. Neck supple. No JVD present. No tracheal deviation present.  FROM to C-spine. Pt can touch chin to chest without discomfort. No TTP of midline cervical spine.   Cardiovascular: Normal rate, regular rhythm, normal heart sounds and intact distal pulses.  Exam reveals no friction rub.   Moderate tachycardia ranging between 120s and 140s  Pulmonary/Chest: Effort normal. No stridor. No respiratory distress. She has wheezes. She has no rales. She exhibits no tenderness.  She is on 3 L via nasal cannula. Reduced air movement in all fields, moderate scattered expiratory wheezing. Patient is speaking in shorter sentences, no tripoding, no accessory muscle use.  Abdominal: Soft. She exhibits no distension and no mass. There is no tenderness. There is  no rebound and no guarding.  Musculoskeletal: Normal range of motion. She exhibits no edema or tenderness.  No calf asymmetry, superficial collaterals, palpable cords, edema, Homans sign negative bilaterally.    Neurological: She is alert and oriented to person, place, and time.  Skin: Skin is warm. She is not diaphoretic.  Psychiatric: She has a normal mood and affect.  Nursing note and vitals reviewed.   ED Course  Procedures (including critical care time) Labs Review Labs Reviewed  BASIC METABOLIC PANEL - Abnormal; Notable for the following:    Potassium 3.2 (*)    Chloride 92 (*)    CO2 36 (*)    Glucose, Bld 109 (*)    All other components within normal limits  CBC - Abnormal; Notable  for the following:    RBC 3.85 (*)    Hemoglobin 11.4 (*)    All other components within normal limits  HEPATIC FUNCTION PANEL - Abnormal; Notable for the following:    Total Protein 6.3 (*)    Total Bilirubin 0.2 (*)    Bilirubin, Direct <0.1 (*)    All other components within normal limits  URINE CULTURE  CULTURE, BLOOD (ROUTINE X 2)  CULTURE, BLOOD (ROUTINE X 2)  PROTIME-INR  URINALYSIS, ROUTINE W REFLEX MICROSCOPIC (NOT AT [facility])  ETHANOL  INFLUENZA PANEL BY PCR (TYPE A & B, H1N1)  BRAIN NATRIURETIC PEPTIDE  MAGNESIUM  I-STAT TROPOININ, ED  I-STAT CG4 LACTIC ACID, ED    Imaging Review Dg Chest 2 View  11/22/2015  CLINICAL DATA:  Productive cough for 1 week EXAM: CHEST  2 VIEW COMPARISON:  09/29/2015 FINDINGS: There is patchy airspace disease in the peripheral and inferior right upper lobe. Normal heart size. Normal vascularity. Increased AP diameter of the chest. No pneumothorax. No pleural effusion. Stable mid-level thoracic compression deformity. IMPRESSION: Patchy right upper lobe airspace disease. Followup PA and lateral chest X-ray is recommended in 3-4 weeks following trial of antibiotic therapy to ensure resolution and exclude underlying malignancy. Electronically Signed   By:  Jolaine Click M.D.   On: 11/22/2015 08:29   I have personally reviewed and evaluated these images and lab results as part of my medical decision-making.   EKG Interpretation   Date/Time:  Tuesday November 22 2015 07:42:50 EST Ventricular Rate:  132 PR Interval:  133 QRS Duration: 84 QT Interval:  285 QTC Calculation: 422 R Axis:   -79 Text Interpretation:  Sinus tachycardia Ventricular tachycardia,  unsustained Aberrant conduction of SV complex(es) Left axis deviation RSR'  in V1 or V2, probably normal variant Baseline wander in lead(s) V4 Rate  faster Nonspecific ST and T wave abnormality Confirmed by Manus Gunning  MD,  STEPHEN [phone]) on 11/22/2015 7:55:28 AM      MDM   Final diagnoses:  Community acquired pneumonia  Hypokalemia  Sepsis, due to unspecified organism (HCC)  Hypomagnesemia    Filed Vitals:   11/22/15 1045 11/22/15 1100 11/22/15 1115 11/22/15 1145  BP: 112/62 105/64 114/68 83/67  Pulse: 118 115 110 117  Temp:      TempSrc:      Resp: 18 17 21 18   Weight:      SpO2: 94% 96% 97% 96%    Medications  vancomycin (VANCOCIN) IVPB 750 mg/150 ml premix (not administered)  aztreonam (AZACTAM) 2 g in dextrose 5 % 50 mL IVPB (not administered)  magnesium sulfate IVPB 2 g 50 mL (2 g Intravenous New Bag/Given 11/22/15 1156)  sodium chloride 0.9 % bolus 1,000 mL (not administered)  acetaminophen (TYLENOL) tablet 650 mg (650 mg Oral Given 11/22/15 0843)  sodium chloride 0.9 % bolus 1,000 mL (1,000 mLs Intravenous New Bag/Given 11/22/15 0859)    Followed by  sodium chloride 0.9 % bolus 500 mL (500 mLs Intravenous New Bag/Given 11/22/15 0857)  ALPRAZolam (XANAX) tablet 1 mg (1 mg Oral Given 11/22/15 0843)  methylPREDNISolone sodium succinate (SOLU-MEDROL) 125 mg/2 mL injection 125 mg (125 mg Intravenous Given 11/22/15 0843)  ipratropium-albuterol (DUONEB) 0.5-2.5 (3) MG/3ML nebulizer solution 3 mL (3 mLs Nebulization Given 11/22/15 0843)  aztreonam (AZACTAM) 2 g in dextrose 5 % 50 mL IVPB  (0 g Intravenous Stopped 11/22/15 0955)  vancomycin (VANCOCIN) IVPB 1000 mg/200 mL premix (0 mg Intravenous Stopped 11/22/15 1156)  potassium chloride SA (K-DUR,KLOR-CON) CR  tablet 40 mEq (40 mEq Oral Given 11/22/15 1156)    NERI VIEYRA is 55 y.o. female presenting with Cough, pleuritic chest pain, shortness of breath febrile to 102.4 and tachycardic to 140. Blood pressure is soft with systolic in the mid 90s. Chart review shows that she normally has systolic in the 120s. Sepsis protocol initiated patient is pancultured, 30 mL per KG bolus and patient started on HCAP Regimen at attending physician's instruction.  EKG nonischemic, blood work reassuring with normal white count, normal lactic acid levels, troponin negative. Her potassium is 3.2 and CO2 is high at 36, this is not atypical for her baseline.   Chest x-ray shows a right upper lobe airspace patchy abnormality. Recommend follow-up chest x-ray in 3-4 weeks to exclude malignancy.  Unassigned admission to internal medicine teaching service. Discussed with resident Dr. Danella Penton, her attending is Dr. Algis Liming.  Blood pressure has improved to 100. Discussed with attending physician, recommends admitting to step down bed. Check of ABG without significant abnormality.  Wynetta Emery, PA-C 11/22/15 1459  Glynn Octave, MD 11/22/15 2005

## 2015-11-22 NOTE — Progress Notes (Signed)
Date: 11/22/2015               Patient Name:  Wendy [name] MRN: [id number]  DOB: March 16, 1961 Age / Sex: 55 y.o., female   PCP: Dessa Phi, MD              Medical Service: Internal Medicine Teaching Service              Attending Physician: Dr. Randall Hiss, MD    First Contact: Rosebud Poles, MS4 Pager: [phone]  Second Contact: Dr. Gara Kroner Pager: [phone]  Third Contact Dr. Daiva Eves Pager: [phone]       After Hours (After 5p/  First Contact Pager: [phone]  weekends / holidays): Second Contact Pager: [phone]   Chief Complaint: Severe worsening shortness of breath x2 days  History of Present Illness: Wendy [name] is a 55 y.o. woman with PMH COPD, asthma, depression, and psoriasis presents with worsening cough and shortness of breath over the past 2 days. She reports that she is always mildly short of breath and requires 3 L of oxygen at home at all times. Over the past 2 days, she began having a cough that is mildly productive of thick yellow sputum and worsening shortness of breath. This morning, she woke up so short of breath that she felt unable to even get out of bed, so she called 911 to go to the hospital. In the ED, CXR revealed patchy RUL infiltrate. The patient has also had intermittent substernal chest pain which is sharp in nature and not associated with exertion. She reports at one point she was told her chest pain may be GERD.  Notably, her son (who has down syndrome) lives at home with her and has an URI.  The patient is currently taking prednisone 20 mg daily for her COPD and humira for her psoriasis. Given these home meds, she was treated for HCAP with aztreonam and vancomycin.  Meds: Current Facility-Administered Medications  Medication Dose Route Frequency Provider Last Rate Last Dose  . 0.9 %  sodium chloride infusion   Intravenous Continuous Denton Brick, MD      . aztreonam (AZACTAM) 2 g in dextrose 5 % 50 mL IVPB  2 g Intravenous Q8H Marquita Palms, [facility]      . heparin injection 5,000 Units  5,000 Units Subcutaneous 3 times per day Denton Brick, MD      . vancomycin (VANCOCIN) IVPB 750 mg/150 ml premix  750 mg Intravenous Q12H Marquita Palms, [facility]       Current Outpatient Prescriptions  Medication Sig Dispense Refill  . ADVAIR DISKUS 500-50 MCG/DOSE AEPB INHALE 1 PUFF INTO THE LUNGS 2 TIMES DAILY 60 each 3  . albuterol (PROAIR HFA) 108 (90 BASE) MCG/ACT inhaler 2 puffs every 4 hours as needed- rescue (Patient taking differently: Inhale 2 puffs into the lungs every 4 (four) hours as needed for wheezing. 2 puffs every 4 hours as needed- rescue) 2 Inhaler prn  . ALPRAZolam (XANAX) 1 MG tablet 1 tab, three times daily as needed (Patient taking differently: Take 1 mg by mouth 3 (three) times daily as needed for anxiety. 1 tab, three times daily as needed) 90 tablet 5  . dextromethorphan-guaiFENesin (MUCINEX DM) 30-600 MG 12hr tablet Take 1 tablet by mouth 2 (two) times daily.    . fluticasone (FLONASE) 50 MCG/ACT nasal spray PLACE 2 SPRAYS INTO BOTH NOSTRILS DAILY. 16 g 2  . pantoprazole (PROTONIX) 20 MG tablet TAKE  1 TABLET (20 MG TOTAL) BY MOUTH DAILY BEFORE SUPPER. 30 tablet 5  . predniSONE (DELTASONE) 10 MG tablet TAKE 1 OR 2 TABLETS DAILY AS NEEDED 60 tablet 5  . SPIRIVA HANDIHALER 18 MCG inhalation capsule PLACE 1 CAPSULE (18 MCG TOTAL) INTO INHALER AND INHALE DAILY. 30 capsule 4  . theophylline (UNIPHYL) 400 MG 24 hr tablet Take 0.5 tablets (200 mg total) by mouth 2 (two) times daily with a meal. 30 tablet 11  . traZODone (DESYREL) 50 MG tablet Take 1 tablet (50 mg total) by mouth at bedtime. 90 tablet 3  . Adalimumab (HUMIRA PEN-CROHNS STARTER) 40 MG/0.8ML PNKT Inject 40 mg into the skin every 14 (fourteen) days. 2 each   . albuterol (PROVENTIL) (2.5 MG/3ML) 0.083% nebulizer solution Take 2.5 mg by nebulization every 6 (six) hours as needed for wheezing or shortness of breath.    . furosemide (LASIX) 20 MG tablet 1 tablet every other  day as needed (Patient not taking: Reported on 11/22/2015) 10 tablet 0    Allergies: Allergies as of 11/22/2015 - Review Complete 11/22/2015  Allergen Reaction Noted  . Augmentin [amoxicillin-pot clavulanate] Nausea Only 08/07/2013  . Cefdinir Other (See Comments) 09/21/2015  . Codeine Nausea Only 10/06/2013  . Fexofenadine Nausea Only   . Other  03/15/2011   Past Medical History  Diagnosis Date  . Bronchitis   . Asthma   . Seasonal allergies   . Poor dentition   . Alcoholism (HCC)   . COPD (chronic obstructive pulmonary disease) (HCC)   . Depression   . Oxygen dependent     home oxygen 3L/min  . Shortness of breath    Past Surgical History  Procedure Laterality Date  . Cesarean section      x 2  . Tubal ligation     Family History  Problem Relation Age of Onset  . Lung cancer Father   . COPD Father    Social History   Social History  . Marital Status: Widowed    Spouse Name: N/A  . Number of Children: 4  . Years of Education: N/A   Occupational History  . disabled     was a Production designer, theatre/television/film at [employer]   Social History Main Topics  . Smoking status: Former Smoker -- 1.00 packs/day for 40 years    Types: Cigarettes    Start date: 11/10/1970    Quit date: 09/17/2013  . Smokeless tobacco: Never Used  . Alcohol Use: No     Comment: 5 cans of beer daily  " 06/2014 drinks occasional ".  3s/2/16 No longer drink  . Drug Use: No  . Sexual Activity:    Partners: Male    Birth Control/ Protection: None   Other Topics Concern  . Not on file   Social History Narrative    Review of Systems: Neuro: Denies dizziness or syncope. Cardio: Endorses palpitations which she feels are due to anxiety GI: Denies diarrhea. Vomited x1 this AM See HPI for other pertinent positives and negatives.  Physical Exam: Blood pressure 99/65, pulse 108, temperature 100.1 F (37.8 C), temperature source Oral, resp. rate 18, weight 68.947 kg (152 lb), SpO2 97 %. BP 99/65 mmHg  Pulse 108   Temp(Src) 100.1 F (37.8 C) (Oral)  Resp 18  Wt 68.947 kg (152 lb)  SpO2 97%  General Appearance:    Alert, cooperative, no distress, appears stated age  Head:    Normocephalic, without obvious abnormality, atraumatic  Eyes:    PERRL, conjunctiva/corneas clear,  EOM's intact, fundi    benign, both eyes  Nose:   Nares normal, no drainage, nasal oxygen mask  Neck:   Supple, symmetrical, large. No thyroid tenderness.  No JVD.   Back:     Symmetric, no curvature, ROM normal, no CVA tenderness  Lungs:     Heavily labored respirations on 3L oxygen. Wheezing in upper and lower lobes bilaterally.  Chest Wall:  Tender to palpation. No lesions.   Heart:    Regular rhythm. Tachycardia to 112. S1 and S2 normal, no murmur, rub   or gallop  Abdomen:     Distended, large. No striae. Diffusely tender. No masses.  Extremities:   Extremities normal, atraumatic, no cyanosis or edema  Skin:   Skin color, texture, turgor normal, no rashes or lesions  Neurologic:   Alert and oriented. Normal sensation throughout.    Lab results: Basic Metabolic Panel:  Recent Labs  90/24/09 0800 11/22/15 0935  NA 139  --   K 3.2*  --   CL 92*  --   CO2 36*  --   GLUCOSE 109*  --   BUN 6  --   CREATININE 0.86  --   CALCIUM 9.0  --   MG  --  1.3*   Liver Function Tests:  Recent Labs  11/22/15 0800  AST 20  ALT 14  ALKPHOS 40  BILITOT 0.2*  PROT 6.3*  ALBUMIN 3.7   No results for input(s): LIPASE, AMYLASE in the last 72 hours. No results for input(s): AMMONIA in the last 72 hours. CBC:  Recent Labs  11/22/15 0800  WBC 9.5  HGB 11.4*  HCT 36.5  MCV 94.8  PLT 216   Cardiac Enzymes: No results for input(s): CKTOTAL, CKMB, CKMBINDEX, TROPONINI in the last 72 hours. BNP: No results for input(s): PROBNP in the last 72 hours. D-Dimer: No results for input(s): DDIMER in the last 72 hours. CBG: No results for input(s): GLUCAP in the last 72 hours. Hemoglobin A1C: No results for input(s): HGBA1C  in the last 72 hours. Fasting Lipid Panel: No results for input(s): CHOL, HDL, LDLCALC, TRIG, CHOLHDL, LDLDIRECT in the last 72 hours. Thyroid Function Tests: No results for input(s): TSH, T4TOTAL, FREET4, T3FREE, THYROIDAB in the last 72 hours. Anemia Panel: No results for input(s): VITAMINB12, FOLATE, FERRITIN, TIBC, IRON, RETICCTPCT in the last 72 hours. Coagulation:  Recent Labs  11/22/15 0800  LABPROT 12.7  INR 0.93   Urine Drug Screen: Drugs of Abuse     Component Value Date/Time   LABOPIA NONE DETECTED 11/27/2012 0017   COCAINSCRNUR NONE DETECTED 11/27/2012 0017   LABBENZ POSITIVE* 11/27/2012 0017   AMPHETMU NONE DETECTED 11/27/2012 0017   THCU NONE DETECTED 11/27/2012 0017   LABBARB NONE DETECTED 11/27/2012 0017    Alcohol Level:  Recent Labs  11/22/15 0800  ETH <5   Urinalysis:  Recent Labs  11/22/15 1320  COLORURINE STRAW*  LABSPEC 1.004*  PHURINE 5.5  GLUCOSEU NEGATIVE  HGBUR NEGATIVE  BILIRUBINUR NEGATIVE  KETONESUR NEGATIVE  PROTEINUR NEGATIVE  NITRITE NEGATIVE  LEUKOCYTESUR NEGATIVE    Imaging results:  Dg Chest 2 View  11/22/2015  CLINICAL DATA:  Productive cough for 1 week EXAM: CHEST  2 VIEW COMPARISON:  09/29/2015 FINDINGS: There is patchy airspace disease in the peripheral and inferior right upper lobe. Normal heart size. Normal vascularity. Increased AP diameter of the chest. No pneumothorax. No pleural effusion. Stable mid-level thoracic compression deformity. IMPRESSION: Patchy right upper lobe airspace disease. Followup PA  and lateral chest X-ray is recommended in 3-4 weeks following trial of antibiotic therapy to ensure resolution and exclude underlying malignancy. Electronically Signed   By: Jolaine Click M.D.   On: 11/22/2015 08:29    Assessment & Plan by Problem: Active Problems:   Pneumonia   Sepsis (HCC)   PNA (pneumonia)  Pneumonia vs COPD exacerbation in setting of immunocompromization The patient presented with productive  cough, chest pain, and worsening shortness of breath. She was found to be febrile and tachycardic. CXR revealed patchy RUL infiltrate. She takes prednisone 20 mg daily at home as well as humira 2xmonth. She has severe COPD with PFT 02/13/2013 showing FEV1 of 49%. She has no history of MI or CHF (2D echo 10/10/2013 shows EF 55-60% with mild diastolic dysfunction). Sick contacts include her son who has an URI and lives at home with her. Treatments - Continue aztreonam 2g q8h and vancomycin 750 mg q12h - Duoneb 0.5-2.5 scheduled q6h - Solumderol 60 mg scheduled q6h - Albuterol 2 puffs q2h PRN for wheezing or shortness of breath - Mucinex BID - Flonase daily - NS @100  ml/hr Diagnostics - Sputum gram stain and culture - Trends troponins to rule out ACS - EKG tomorrow - Strep pneumo urinary antigen  Hypomagnesemia The patient presented with Mag of 1.3 and was treated in the ED with magnesium 2g and potassium chloride 40 mEq - Continue to monitor - Replete as necessary   Anxiety The patient takes xanax 1 mg TID at home and is requesting xanax here as she feels very anxious and tachycardic. Reports she normally does take her xanax TID. Has a history of intentional overdose with alprazolam 2014.  - Xanax 0.5 mg TID  GERD Patient endorses intermittent substernal chest pain of a sharp nature. Has been told in the past this may be due to GERD. Takes pantoprazole 20mg  daily at home. - Pantoprazole 40 mg daily  VTE prophylaxis Heparin 5000 units TID  This is a Note.  The care of the patient was discussed with Dr. and the assessment and plan was formulated with their assistance.  Please see their note for official documentation of the patient encounter.   Signed: Psychologist, occupational, Med Student 11/22/2015, 3:08 PM

## 2015-11-22 NOTE — H&P (Signed)
Date: 11/22/2015               Patient Name:  Wendy [name] MRN: [id number]  DOB: 26-Feb-1961 Age / Sex: 55 y.o., female   PCP: Dessa Phi, MD         Medical Service: Internal Medicine Teaching Service         Attending Physician: Dr. Randall Hiss, MD    First Contact: Dr. Rosebud Poles Pager: [phone]  Second Contact: Dr. Gara Kroner Pager: [phone]       After Hours (After 5p/  First Contact Pager: [phone]  weekends / holidays): Second Contact Pager: [phone]   Chief Complaint: cough, SOB, and chest pain  History of Present Illness: 55 y/o F with past medical history of CHF (grade 1 diastolic dysfunction, EF 55-60% 09/2013), COPD on home oxygen, and depression who presents w/ productive cough, SOB, and chest pain for the past 5-7 days. Cough has been productive of yellow sputum. She has tried increasing her home O2 from her normal 3L to 3.5L and her home inhalers without any improvement in her breathing. She had a measure temperature of 1037F yesterday at home as well as one episode of non bloody emesis this morning. She lives with her son who has down syndrome and states he was recently sick. Her chest pain is located over her mid sternum and feels like a squeezing pain. She has a hx of acid reflux and takes over the counter rolaids. She  follows with Dr. Maple Hudson w/ pulmonology and is on chronic steroids since 2015. She is also on Humira for her plaque psoriasis.   In the ED, she had a temperature of 102.37F, RR 23, and pulse 135. Chest xray revealed a patchy right upper lobe infiltrate concerning for pneumonia. Lactic acid was normal.  Her blood pressure on presentation was 100/62 down to 83/67. She given 1.5L of NS, duoneb x1, IV solumedrol 125mg , and started on aztreonam and vancomycin for pneumonia.    Meds: Current Facility-Administered Medications  Medication Dose Route Frequency Provider Last Rate Last Dose  . aztreonam (AZACTAM) 2 g in dextrose 5 % 50 mL IVPB  2 g  Intravenous Q8H , RPH      . vancomycin (VANCOCIN) IVPB 750 mg/150 ml premix  750 mg Intravenous Q12H Marquita Palms, [facility]       Current Outpatient Prescriptions  Medication Sig Dispense Refill  . ADVAIR DISKUS 500-50 MCG/DOSE AEPB INHALE 1 PUFF INTO THE LUNGS 2 TIMES DAILY 60 each 3  . albuterol (PROAIR HFA) 108 (90 BASE) MCG/ACT inhaler 2 puffs every 4 hours as needed- rescue (Patient taking differently: Inhale 2 puffs into the lungs every 4 (four) hours as needed for wheezing. 2 puffs every 4 hours as needed- rescue) 2 Inhaler prn  . ALPRAZolam (XANAX) 1 MG tablet 1 tab, three times daily as needed (Patient taking differently: Take 1 mg by mouth 3 (three) times daily as needed for anxiety. 1 tab, three times daily as needed) 90 tablet 5  . dextromethorphan-guaiFENesin (MUCINEX DM) 30-600 MG 12hr tablet Take 1 tablet by mouth 2 (two) times daily.    . fluticasone (FLONASE) 50 MCG/ACT nasal spray PLACE 2 SPRAYS INTO BOTH NOSTRILS DAILY. 16 g 2  . pantoprazole (PROTONIX) 20 MG tablet TAKE 1 TABLET (20 MG TOTAL) BY MOUTH DAILY BEFORE SUPPER. 30 tablet 5  . predniSONE (DELTASONE) 10 MG tablet TAKE 1 OR 2 TABLETS DAILY AS NEEDED 60 tablet 5  .  SPIRIVA HANDIHALER 18 MCG inhalation capsule PLACE 1 CAPSULE (18 MCG TOTAL) INTO INHALER AND INHALE DAILY. 30 capsule 4  . theophylline (UNIPHYL) 400 MG 24 hr tablet Take 0.5 tablets (200 mg total) by mouth 2 (two) times daily with a meal. 30 tablet 11  . traZODone (DESYREL) 50 MG tablet Take 1 tablet (50 mg total) by mouth at bedtime. 90 tablet 3  . Adalimumab (HUMIRA PEN-CROHNS STARTER) 40 MG/0.8ML PNKT Inject 40 mg into the skin every 14 (fourteen) days. 2 each   . albuterol (PROVENTIL) (2.5 MG/3ML) 0.083% nebulizer solution Take 2.5 mg by nebulization every 6 (six) hours as needed for wheezing or shortness of breath.    . furosemide (LASIX) 20 MG tablet 1 tablet every other day as needed (Patient not taking: Reported on 11/22/2015) 10 tablet 0     Allergies: Allergies as of 11/22/2015 - Review Complete 11/22/2015  Allergen Reaction Noted  . Augmentin [amoxicillin-pot clavulanate] Nausea Only 08/07/2013  . Cefdinir Other (See Comments) 09/21/2015  . Codeine Nausea Only 10/06/2013  . Fexofenadine Nausea Only   . Other  03/15/2011   Past Medical History  Diagnosis Date  . Bronchitis   . Asthma   . Seasonal allergies   . Poor dentition   . Alcoholism (HCC)   . COPD (chronic obstructive pulmonary disease) (HCC)   . Depression   . Oxygen dependent     home oxygen 3L/min  . Shortness of breath    Past Surgical History  Procedure Laterality Date  . Cesarean section      x 2  . Tubal ligation     Family History  Problem Relation Age of Onset  . Lung cancer Father   . COPD Father    Social History   Social History  . Marital Status: Widowed    Spouse Name: N/A  . Number of Children: 4  . Years of Education: N/A   Occupational History  . disabled     was a Production designer, theatre/television/film at [employer]   Social History Main Topics  . Smoking status: Former Smoker -- 1.00 packs/day for 40 years    Types: Cigarettes    Start date: 11/10/1970    Quit date: 09/17/2013  . Smokeless tobacco: Never Used  . Alcohol Use: No     Comment: 5 cans of beer daily  " 06/2014 drinks occasional ".  3s/2/16 No longer drink  . Drug Use: No  . Sexual Activity:    Partners: Male    Birth Control/ Protection: None   Other Topics Concern  . Not on file   Social History Narrative    Review of Systems: Review of Systems  Constitutional: Positive for fever (measure temperature of 104F at home). Negative for weight loss (50lb weight gain since starting daily prednisone 2  years ago).  Respiratory: Positive for cough, sputum production, shortness of breath and wheezing. Negative for hemoptysis.   Cardiovascular: Positive for chest pain (sub sternal, squeezing sensation), palpitations (feels like her heart is fluttering) and orthopnea (sleeps on 3 pillows  at night). Negative for leg swelling.  Gastrointestinal: Positive for heartburn and vomiting (vomited once this morning).  Genitourinary:       Stress urinary incontinence   Skin: Negative for rash.  Psychiatric/Behavioral: The patient is nervous/anxious (hx of anxiety, takes xanax for this).   All other systems reviewed and are negative.    Physical Exam: Blood pressure 99/65, pulse 108, temperature 100.1 F (37.8 C), temperature source Oral, resp. rate 18, weight  152 lb (68.947 kg), SpO2 97 %. Physical Exam  Constitutional: She appears well-developed and well-nourished.  HENT:  Head: Normocephalic and atraumatic.  Right Ear: External ear normal.  Left Ear: External ear normal.  cushinoid features   Eyes: Conjunctivae and EOM are normal. Right eye exhibits no discharge. Left eye exhibits no discharge. No scleral icterus.  Neck: Neck supple. No thyromegaly present.  Cardiovascular: Regular rhythm and normal heart sounds.  Tachycardia present.  Exam reveals no gallop and no friction rub.   No murmur heard. Pulmonary/Chest: Accessory muscle usage present. Tachypnea noted. She has wheezes. She has rhonchi. She exhibits no tenderness.  Abdominal: Soft. Bowel sounds are normal. She exhibits no distension and no mass. There is no rebound and no guarding.  Musculoskeletal: She exhibits no edema (neg for pedal edema).  Skin: Skin is warm and dry. No rash noted. She is not diaphoretic. No erythema. No pallor.  Neg for abd striae    Lab results: Basic Metabolic Panel:  Recent Labs  43/15/40 0800 11/22/15 0935  NA 139  --   K 3.2*  --   CL 92*  --   CO2 36*  --   GLUCOSE 109*  --   BUN 6  --   CREATININE 0.86  --   CALCIUM 9.0  --   MG  --  1.3*   Liver Function Tests:  Recent Labs  11/22/15 0800  AST 20  ALT 14  ALKPHOS 40  BILITOT 0.2*  PROT 6.3*  ALBUMIN 3.7   CBC:  Recent Labs  11/22/15 0800  WBC 9.5  HGB 11.4*  HCT 36.5  MCV 94.8  PLT 216     Coagulation:  Recent Labs  11/22/15 0800  LABPROT 12.7  INR 0.93   Urine Drug Screen: Drugs of Abuse     Component Value Date/Time   LABOPIA NONE DETECTED 11/27/2012 0017   COCAINSCRNUR NONE DETECTED 11/27/2012 0017   LABBENZ POSITIVE* 11/27/2012 0017   AMPHETMU NONE DETECTED 11/27/2012 0017   THCU NONE DETECTED 11/27/2012 0017   LABBARB NONE DETECTED 11/27/2012 0017    Alcohol Level:  Recent Labs  11/22/15 0800  ETH <5   Urinalysis:  Recent Labs  11/22/15 1320  COLORURINE STRAW*  LABSPEC 1.004*  PHURINE 5.5  GLUCOSEU NEGATIVE  HGBUR NEGATIVE  BILIRUBINUR NEGATIVE  KETONESUR NEGATIVE  PROTEINUR NEGATIVE  NITRITE NEGATIVE  LEUKOCYTESUR NEGATIVE    Imaging results:  Dg Chest 2 View  11/22/2015  CLINICAL DATA:  Productive cough for 1 week EXAM: CHEST  2 VIEW COMPARISON:  09/29/2015 FINDINGS: There is patchy airspace disease in the peripheral and inferior right upper lobe. Normal heart size. Normal vascularity. Increased AP diameter of the chest. No pneumothorax. No pleural effusion. Stable mid-level thoracic compression deformity. IMPRESSION: Patchy right upper lobe airspace disease. Followup PA and lateral chest X-ray is recommended in 3-4 weeks following trial of antibiotic therapy to ensure resolution and exclude underlying malignancy. Electronically Signed   By: Jolaine Click M.D.   On: 11/22/2015 08:29    Other results: EKG Interpretation  Date/Time:  Tuesday November 22 2015 07:42:50 EST Ventricular Rate:  132 PR Interval:  133 QRS Duration: 84 QT Interval:  285 QTC Calculation: 422 R Axis:   -79 Text Interpretation:  Sinus tachycardia Ventricular tachycardia, unsustained Aberrant conduction of SV complex(es) Left axis deviation RSR' in V1 or V2, probably normal variant Baseline wander in lead(s) V4 Rate faster Nonspecific ST and T wave abnormality Confirmed by Manus Gunning  MD,  STEPHEN [phone]) on 11/22/2015 7:55:28 AM  Assessment & Plan by Problem: Active  Problems:   Psoriasis   Pneumonia   Sepsis (HCC)   PNA (pneumonia)  Pneumonia in setting of immunosuppresion-- pt is on humira for plaque psoriasis and daily prednisone 10mg  for advanced COPD. CXR + for RUL infiltrate. She meets SIRS criteria: fever of 102.68F, tachycardia, and increased RR. She does not have a WBC but is on chronic steroids. Flu panel and LA negative. She was volume resuscitated in the ED for low blood pressures. - admit to SDU - continue aztreonam and vancomycin - follow blood and urine cultures - follow pro calcitonin - NS at 100 cc/hr, caution for volume overload due to grade 1 diastolic dysfunction noted on ECHO 09/2013. EF was normal.  - strept pneumo urinary antigen, respiratory viral panel, adenovirus ab, and legionella ab pending - HIV and Hep C labs ordered  Acute COPD exacerbation-- pt on home 3L oxygen. PFTs in 2014 + for severe obstructive airway disease, diffusion severely reduced. FEV1/FVC 0.48/19%. Received solumedrol IV125mg  in the ED and breathing tx.  - duonebs q4h, albuterol inhaler q2h prn, and albuterol neb q4h prn - solumedrol 60mg  q6, will transition to oral prednisone tomorrow depending on lung exam - ordered sputum culture and gram stain - continue home mucinex DM - oxygen therapy to maintain O2 sats between 88-92%  Chest pain-- likely from PNA and COPD exacerbation. Pleuritic in nature and located over midsternum. Unlikely ACS related. She also endorses acid reflux that she treats with OTC rolaids. EKG not concerning for ischemia. Point of care trop nl.  - Will check one troponin.  - repeat EKG in the am  Anxiety-- hx of intentional benzo overdose in 2014 that did not require intubation. She is on xanax 1mg  TID. - xanax 0.5mg  TID  FEN - hypokalemia, K 3.2, repleated with oral Kdur - hypomagnesemia, Mag 1.3, repleted with 2g mag sulfate IV -  HH diet - NS at 100cc/hr x 12 hours - BMET in the morning  Code: FULL code, she states she  would not want to be intubated if she would have to stay on the ventilator for a prolonged period of time.   Dispo: Disposition is deferred at this time, awaiting improvement of current medical problems. The patient does have a current PCP (2015, MD) and does not need an [facility] hospital follow-up appointment after discharge.  The patient does not have transportation limitations that hinder transportation to clinic appointments.  Signed: , MD 11/22/2015, 2:40 PM

## 2015-11-23 ENCOUNTER — Inpatient Hospital Stay (HOSPITAL_COMMUNITY): Payer: Medicare Other

## 2015-11-23 DIAGNOSIS — J9612 Chronic respiratory failure with hypercapnia: Secondary | ICD-10-CM | POA: Insufficient documentation

## 2015-11-23 DIAGNOSIS — R079 Chest pain, unspecified: Secondary | ICD-10-CM

## 2015-11-23 DIAGNOSIS — R05 Cough: Secondary | ICD-10-CM | POA: Insufficient documentation

## 2015-11-23 DIAGNOSIS — J9602 Acute respiratory failure with hypercapnia: Secondary | ICD-10-CM

## 2015-11-23 DIAGNOSIS — J449 Chronic obstructive pulmonary disease, unspecified: Secondary | ICD-10-CM | POA: Insufficient documentation

## 2015-11-23 DIAGNOSIS — D849 Immunodeficiency, unspecified: Secondary | ICD-10-CM | POA: Insufficient documentation

## 2015-11-23 DIAGNOSIS — G4733 Obstructive sleep apnea (adult) (pediatric): Secondary | ICD-10-CM | POA: Insufficient documentation

## 2015-11-23 DIAGNOSIS — F419 Anxiety disorder, unspecified: Secondary | ICD-10-CM

## 2015-11-23 DIAGNOSIS — J189 Pneumonia, unspecified organism: Secondary | ICD-10-CM | POA: Insufficient documentation

## 2015-11-23 DIAGNOSIS — J441 Chronic obstructive pulmonary disease with (acute) exacerbation: Secondary | ICD-10-CM

## 2015-11-23 DIAGNOSIS — D899 Disorder involving the immune mechanism, unspecified: Secondary | ICD-10-CM

## 2015-11-23 DIAGNOSIS — R059 Cough, unspecified: Secondary | ICD-10-CM | POA: Insufficient documentation

## 2015-11-23 LAB — CBC
HCT: 36.4 % (ref 36.0–46.0)
Hemoglobin: 10.7 g/dL — ABNORMAL LOW (ref 12.0–15.0)
MCH: 28.2 pg (ref 26.0–34.0)
MCHC: 29.4 g/dL — ABNORMAL LOW (ref 30.0–36.0)
MCV: 95.8 fL (ref 78.0–100.0)
Platelets: 221 10*3/uL (ref 150–400)
RBC: 3.8 MIL/uL — AB (ref 3.87–5.11)
RDW: 13.9 % (ref 11.5–15.5)
WBC: 8.4 10*3/uL (ref 4.0–10.5)

## 2015-11-23 LAB — BLOOD GAS, ARTERIAL
ACID-BASE EXCESS: 6.8 mmol/L — AB (ref 0.0–2.0)
Acid-Base Excess: 9.8 mmol/L — ABNORMAL HIGH (ref 0.0–2.0)
Acid-Base Excess: 11.2 mmol/L — ABNORMAL HIGH (ref 0.0–2.0)
BICARBONATE: 38.4 meq/L — AB (ref 20.0–24.0)
Bicarbonate: 34.2 mEq/L — ABNORMAL HIGH (ref 20.0–24.0)
Bicarbonate: 37.5 mEq/L — ABNORMAL HIGH (ref 20.0–24.0)
DELIVERY SYSTEMS: POSITIVE
DRAWN BY: 129711
DRAWN BY: 129711
Delivery systems: POSITIVE
Drawn by: 129711
Expiratory PAP: 7
FIO2: 0.35
FIO2: 0.35
Inspiratory PAP: 14
O2 CONTENT: 4 L/min
O2 SAT: 94.8 %
O2 Saturation: 91.7 %
O2 Saturation: 92.2 %
PATIENT TEMPERATURE: 98.6
PATIENT TEMPERATURE: 98.6
PEEP: 7 cmH2O
PH ART: 7.228 — AB (ref 7.350–7.450)
PH ART: 7.219 — AB (ref 7.350–7.450)
PH ART: 7.26 — AB (ref 7.350–7.450)
PO2 ART: 80.8 mmHg (ref 80.0–100.0)
PRESSURE CONTROL: 7 cmH2O
Patient temperature: 98.6
TCO2: 36.8 mmol/L (ref 0–100)
TCO2: 40.4 mmol/L (ref 0–100)
TCO2: 41.1 mmol/L (ref 0–100)
pCO2 arterial: 85 mmHg (ref 35.0–45.0)
pCO2 arterial: 95.4 mmHg (ref 35.0–45.0)
pCO2 arterial: 88.5 mmHg (ref 35.0–45.0)
pO2, Arterial: 72.6 mmHg — ABNORMAL LOW (ref 80.0–100.0)
pO2, Arterial: 73.1 mmHg — ABNORMAL LOW (ref 80.0–100.0)

## 2015-11-23 LAB — BASIC METABOLIC PANEL
ANION GAP: 9 (ref 5–15)
BUN: 7 mg/dL (ref 6–20)
CALCIUM: 8.4 mg/dL — AB (ref 8.9–10.3)
CHLORIDE: 94 mmol/L — AB (ref 101–111)
CO2: 34 mmol/L — AB (ref 22–32)
Creatinine, Ser: 0.62 mg/dL (ref 0.44–1.00)
GFR calc non Af Amer: >60 mL/min (ref >60)
GLUCOSE: 132 mg/dL — AB (ref 65–99)
POTASSIUM: 4.7 mmol/L (ref 3.5–5.1)
Sodium: 137 mmol/L (ref 135–145)

## 2015-11-23 LAB — MRSA PCR SCREENING: MRSA by PCR: NEGATIVE

## 2015-11-23 LAB — HEPATITIS C ANTIBODY: HCV Ab: <0.1 s/co ratio (ref 0.0–0.9)

## 2015-11-23 LAB — LEGIONELLA PNEUMOPHILA TOTAL AB: Legionella Pneumo Total Ab: <0.91 OD ratio (ref 0.00–0.90)

## 2015-11-23 LAB — HIV ANTIBODY (ROUTINE TESTING W REFLEX): HIV SCREEN 4TH GENERATION: NONREACTIVE

## 2015-11-23 LAB — MAGNESIUM: MAGNESIUM: 2.1 mg/dL (ref 1.7–2.4)

## 2015-11-23 MED ORDER — DEXTROSE 5 % IV SOLN
250.0000 mg | INTRAVENOUS | Status: DC
  Administered 2015-11-24 – 2015-11-26 (×3): 250 mg via INTRAVENOUS
  Filled 2015-11-23 (×3): qty 250

## 2015-11-23 MED ORDER — LORATADINE 10 MG PO TABS
10.0000 mg | ORAL_TABLET | Freq: Every day | ORAL | Status: DC
Start: 2015-11-23 — End: 2015-11-24
  Administered 2015-11-23 (×2): 10 mg via ORAL
  Filled 2015-11-23 (×2): qty 1

## 2015-11-23 MED ORDER — DEXTROSE 5 % IV SOLN
500.0000 mg | Freq: Once | INTRAVENOUS | Status: AC
  Administered 2015-11-23: 500 mg via INTRAVENOUS
  Filled 2015-11-23: qty 500

## 2015-11-23 MED ORDER — IPRATROPIUM-ALBUTEROL 0.5-2.5 (3) MG/3ML IN SOLN
3.0000 mL | RESPIRATORY_TRACT | Status: DC
  Administered 2015-11-23 – 2015-11-26 (×15): 3 mL via RESPIRATORY_TRACT
  Filled 2015-11-23 (×16): qty 3

## 2015-11-23 MED ORDER — ACETAMINOPHEN 325 MG PO TABS
650.0000 mg | ORAL_TABLET | Freq: Four times a day (QID) | ORAL | Status: DC | PRN
Start: 2015-11-23 — End: 2015-11-30
  Administered 2015-11-23 (×2): 650 mg via ORAL
  Filled 2015-11-23 (×2): qty 2

## 2015-11-23 MED ORDER — FLUTICASONE PROPIONATE 50 MCG/ACT NA SUSP
2.0000 | Freq: Every day | NASAL | Status: DC
  Filled 2015-11-23: qty 16

## 2015-11-23 NOTE — Progress Notes (Signed)
eLink Physician-Brief Progress Note Patient Name: Wendy [name] DOB: April 19, 1961 MRN: [id number]   Date of Service  11/23/2015  HPI/Events of Note  Camara Check on patient with acute respiratory failure secondary to hypercarbia and COPD exacerbation. Patient has been off of BiPAP per her report for 45 minutes. Mildly increased work of breathing. Respiratory rate 14 with hemodynamic stability.  eICU Interventions  Continue current plan of care.     Intervention Category Major Interventions: Acid-Base disturbance - evaluation and management;Respiratory failure - evaluation and management  Lawanda Cousins 11/23/2015, 4:51 PM

## 2015-11-23 NOTE — Progress Notes (Addendum)
Paged DR to notify that results for ABG are back. Will continue to monitor patient. Called Radiology to get [facility] that was ordered.

## 2015-11-23 NOTE — Progress Notes (Addendum)
Went to check on patient tonight. She was sleeping comfortably on BiPAP. Easily arousable. Maintaining good O2 sats. Patient in no respiratory distress. She has good mentation, AAOx3.   Lungs very tight on exam with decreased breath sounds and scattered wheezing.   Spoke with RT who will adjust her BiPAP settings. Will give scheduled dose of solumedrol and duonebs. Plan for repeat ABG at 2200. Patient indicates that she would want to be intubated if necessary. Low threshold to consult PCCM but they are able to monitor patient remotely with last check at 1650.

## 2015-11-23 NOTE — Progress Notes (Signed)
Subjective: States breathing has improved but not to back to baseline. Upon entrance to room pt stating at 100% on her home 3L o2.  Objective: Vital signs in last 24 hours: Filed Vitals:   11/23/15 0335 11/23/15 0405 11/23/15 0445 11/23/15 0744  BP: 164/88 170/91 165/80   Pulse: 92 103    Temp:    98.7 F (37.1 C)  TempSrc:    Oral  Resp: 20 17    Height:      Weight:      SpO2: 100% 99%  97%   Weight change:   Intake/Output Summary (Last 24 hours) at 11/23/15 0955 Last data filed at 11/22/15 2336  Gross per 24 hour  Intake    250 ml  Output      0 ml  Net    250 ml   General: NAD, obese Lungs: coarse rhonchi, wheezing throughout Cardiac: RRR, no murmurs GI: soft, active bowel sounds Neuro: CN II-XII grossly intact Skin: warm and dry Ext: trace pedal edema  Lab Results: Basic Metabolic Panel:  Recent Labs Lab 11/22/15 0800 11/22/15 0935 11/23/15 0330 11/23/15 0852  NA 139  --  137  --   K 3.2*  --  4.7  --   CL 92*  --  94*  --   CO2 36*  --  34*  --   GLUCOSE 109*  --  132*  --   BUN 6  --  7  --   CREATININE 0.86  --  0.62  --   CALCIUM 9.0  --  8.4*  --   MG  --  1.3*  --  2.1   Liver Function Tests:  Recent Labs Lab 11/22/15 0800  AST 20  ALT 14  ALKPHOS 40  BILITOT 0.2*  PROT 6.3*  ALBUMIN 3.7   CBC:  Recent Labs Lab 11/22/15 0800 11/23/15 0330  WBC 9.5 8.4  HGB 11.4* 10.7*  HCT 36.5 36.4  MCV 94.8 95.8  PLT 216 221   Cardiac Enzymes:  Recent Labs Lab 11/22/15 1654  TROPONINI 0.03   Coagulation:  Recent Labs Lab 11/22/15 0800  LABPROT 12.7  INR 0.93   Urine Drug Screen: Drugs of Abuse     Component Value Date/Time   LABOPIA NONE DETECTED 11/27/2012 0017   COCAINSCRNUR NONE DETECTED 11/27/2012 0017   LABBENZ POSITIVE* 11/27/2012 0017   AMPHETMU NONE DETECTED 11/27/2012 0017   THCU NONE DETECTED 11/27/2012 0017   LABBARB NONE DETECTED 11/27/2012 0017       Micro Results: Recent Results (from the past 240  hour(s))  MRSA PCR Screening     Status: None   Collection Time: 11/23/15  2:04 AM  Result Value Ref Range Status   MRSA by PCR NEGATIVE NEGATIVE Final    Comment:        The GeneXpert MRSA Assay (FDA approved for NASAL specimens only), is one component of a comprehensive MRSA colonization surveillance program. It is not intended to diagnose MRSA infection nor to guide or monitor treatment for MRSA infections.    Studies/Results: Dg Chest 2 View  11/22/2015  CLINICAL DATA:  Productive cough for 1 week EXAM: CHEST  2 VIEW COMPARISON:  09/29/2015 FINDINGS: There is patchy airspace disease in the peripheral and inferior right upper lobe. Normal heart size. Normal vascularity. Increased AP diameter of the chest. No pneumothorax. No pleural effusion. Stable mid-level thoracic compression deformity. IMPRESSION: Patchy right upper lobe airspace disease. Followup PA and lateral chest X-ray is  recommended in 3-4 weeks following trial of antibiotic therapy to ensure resolution and exclude underlying malignancy. Electronically Signed   By: Jolaine Click M.D.   On: 11/22/2015 08:29   Medications: I have reviewed the patient's current medications. Scheduled Meds: . [START ON 11/24/2015] azithromycin  250 mg Intravenous Q24H  . dextromethorphan-guaiFENesin  1 tablet Oral BID  . fluticasone  1 spray Each Nare Daily  . heparin  5,000 Units Subcutaneous 3 times per day  . ipratropium-albuterol  3 mL Nebulization Q4H  . loratadine  10 mg Oral Daily  . methylPREDNISolone (SOLU-MEDROL) injection  60 mg Intravenous Q6H  . pantoprazole  40 mg Oral q morning - 10a  . traZODone  50 mg Oral QHS   Continuous Infusions:  PRN Meds:.acetaminophen, albuterol, ALPRAZolam, ondansetron (ZOFRAN) IV Assessment/Plan: Active Problems:   Psoriasis   Pneumonia   Sepsis (HCC)   PNA (pneumonia)   Acute COPD exacerbation-- pt on home 3L o2 this morning. CXR reviewed and unlikely pt has an RUL PNA. Aztreonam and vanc  was d/c. ' - azithromycin IV started for possible community acquired pna.  - repeat CXR ordered this morning  - duonebs q4h, albuterol inhaler q2h prn, and albuterol neb q4h prn - solumedrol 60mg  q6 - ordered sputum culture and gram stain - continue home mucinex DM - oxygen therapy to maintain O2 sats between 88-92% - ABG pH 7.228 and CO2 85. Will trial BIPAP for 2 hours then repeat ABG in 2 hours.   Anxiety-- hx of intentional benzo overdose in 2014 that did not require intubation. She is on xanax 1mg  TID. - xanax 0.5mg  TID  FEN - HH diet - potassium and mag nl  Code: FULL code, she states she would not want to be intubated if she would have to stay on the ventilator for a prolonged period of time.   Dispo: Disposition is deferred at this time, awaiting improvement of current medical problems. The patient does have a current PCP (2015, MD) and does not need an [facility] hospital follow-up appointment after discharge.   .Services Needed at time of discharge: Y = Yes, Blank = No PT:   OT:   RN:   Equipment:   Other:     LOS: 1 day   Dessa Phi, MD 11/23/2015, 9:55 AM

## 2015-11-23 NOTE — Progress Notes (Signed)
RT note- patient placed on Bipap, encouraged to keep on for 2 hours, tolerating well at this time, continue to monitor.

## 2015-11-23 NOTE — Progress Notes (Signed)
Pt cannot tolerate being flat, sharp desat this am, ABG collected per order; BiPAP & masks on standby. Will continue to monitor.

## 2015-11-23 NOTE — Progress Notes (Signed)
RT note-Bipap removed for patient comfort post ABG and placed on 3.5L. Results given to RN

## 2015-11-23 NOTE — Progress Notes (Signed)
Subjective: Continues to have severe shortness of breath although slightly improved. Cough is mildly productive.  Objective: Vital signs in last 24 hours: Filed Vitals:   11/23/15 0335 11/23/15 0405 11/23/15 0445 11/23/15 0744  BP: 164/88 170/91 165/80   Pulse: 92 103    Temp:    98.7 F (37.1 C)  TempSrc:    Oral  Resp: 20 17    Height:      Weight:      SpO2: 100% 99%  97%   Weight change:   Intake/Output Summary (Last 24 hours) at 11/23/15 0954 Last data filed at 11/22/15 2336  Gross per 24 hour  Intake    250 ml  Output      0 ml  Net    250 ml    Physical Exam General Appearance:    Alert and cooperative, appears uncomfortable and distressed due to SOB  Head:    Normocephalic, without obvious abnormality, atraumatic  Eyes:    conjunctiva/corneas clear, EOM's intact  Nose:   Nares normal,no rhinorrhe  Neck:   Wide, short. No lesions or trauma. No goiter.  Back:     Symmetric, no curvature, ROM normal, no CVA tenderness  Lungs:     Increased WOB. Wheezing in upper and lower lobes bilaterally.  Chest Wall:    No tenderness or deformity   Heart:    Tachycardic. Regular rate, S1 and S2 normal, no murmur, rub   or gallop  Abdomen:     Distended. Moderately soft. Non-tender, no masses, no organomegaly  Extremities:   Extremities normal, atraumatic, no cyanosis. Trace edema.  Skin:   Skin color, texture, turgor normal, no rashes or lesions  Neurologic:   Alert and oriented, normal strength, sensation    Lab Results: Basic Metabolic Panel:  Recent Labs  83/15/17 0800 11/22/15 0935 11/23/15 0330 11/23/15 0852  NA 139  --  137  --   K 3.2*  --  4.7  --   CL 92*  --  94*  --   CO2 36*  --  34*  --   GLUCOSE 109*  --  132*  --   BUN 6  --  7  --   CREATININE 0.86  --  0.62  --   CALCIUM 9.0  --  8.4*  --   MG  --  1.3*  --  2.1   Liver Function Tests:  Recent Labs  11/22/15 0800  AST 20  ALT 14  ALKPHOS 40  BILITOT 0.2*  PROT 6.3*  ALBUMIN 3.7   No  results for input(s): LIPASE, AMYLASE in the last 72 hours. No results for input(s): AMMONIA in the last 72 hours. CBC:  Recent Labs  11/22/15 0800 11/23/15 0330  WBC 9.5 8.4  HGB 11.4* 10.7*  HCT 36.5 36.4  MCV 94.8 95.8  PLT 216 221   Cardiac Enzymes:  Recent Labs  11/22/15 1654  TROPONINI 0.03   BNP: No results for input(s): PROBNP in the last 72 hours. D-Dimer: No results for input(s): DDIMER in the last 72 hours. CBG: No results for input(s): GLUCAP in the last 72 hours. Hemoglobin A1C: No results for input(s): HGBA1C in the last 72 hours. Fasting Lipid Panel: No results for input(s): CHOL, HDL, LDLCALC, TRIG, CHOLHDL, LDLDIRECT in the last 72 hours. Thyroid Function Tests: No results for input(s): TSH, T4TOTAL, FREET4, T3FREE, THYROIDAB in the last 72 hours. Anemia Panel: No results for input(s): VITAMINB12, FOLATE, FERRITIN, TIBC, IRON, RETICCTPCT in the  last 72 hours. Coagulation:  Recent Labs  11/22/15 0800  LABPROT 12.7  INR 0.93   Urine Drug Screen: Drugs of Abuse     Component Value Date/Time   LABOPIA NONE DETECTED 11/27/2012 0017   COCAINSCRNUR NONE DETECTED 11/27/2012 0017   LABBENZ POSITIVE* 11/27/2012 0017   AMPHETMU NONE DETECTED 11/27/2012 0017   THCU NONE DETECTED 11/27/2012 0017   LABBARB NONE DETECTED 11/27/2012 0017    Alcohol Level:  Recent Labs  11/22/15 0800  ETH <5   Urinalysis:  Recent Labs  11/22/15 1320  COLORURINE STRAW*  LABSPEC 1.004*  PHURINE 5.5  GLUCOSEU NEGATIVE  HGBUR NEGATIVE  BILIRUBINUR NEGATIVE  KETONESUR NEGATIVE  PROTEINUR NEGATIVE  NITRITE NEGATIVE  LEUKOCYTESUR NEGATIVE     Micro Results: Recent Results (from the past 240 hour(s))  MRSA PCR Screening     Status: None   Collection Time: 11/23/15  2:04 AM  Result Value Ref Range Status   MRSA by PCR NEGATIVE NEGATIVE Final    Comment:        The GeneXpert MRSA Assay (FDA approved for NASAL specimens only), is one component of  a comprehensive MRSA colonization surveillance program. It is not intended to diagnose MRSA infection nor to guide or monitor treatment for MRSA infections.    Studies/Results: Dg Chest 2 View  11/22/2015  CLINICAL DATA:  Productive cough for 1 week EXAM: CHEST  2 VIEW COMPARISON:  09/29/2015 FINDINGS: There is patchy airspace disease in the peripheral and inferior right upper lobe. Normal heart size. Normal vascularity. Increased AP diameter of the chest. No pneumothorax. No pleural effusion. Stable mid-level thoracic compression deformity. IMPRESSION: Patchy right upper lobe airspace disease. Followup PA and lateral chest X-ray is recommended in 3-4 weeks following trial of antibiotic therapy to ensure resolution and exclude underlying malignancy. Electronically Signed   By: Jolaine Click M.D.   On: 11/22/2015 08:29   Medications:  . [START ON 11/24/2015] azithromycin  250 mg Intravenous Q24H  . dextromethorphan-guaiFENesin  1 tablet Oral BID  . fluticasone  1 spray Each Nare Daily  . heparin  5,000 Units Subcutaneous 3 times per day  . ipratropium-albuterol  3 mL Nebulization Q4H  . loratadine  10 mg Oral Daily  . methylPREDNISolone (SOLU-MEDROL) injection  60 mg Intravenous Q6H  . pantoprazole  40 mg Oral q morning - 10a  . traZODone  50 mg Oral QHS   acetaminophen, albuterol, ALPRAZolam, ondansetron (ZOFRAN) IV  Assessment/Plan: Active Problems:   Psoriasis   Pneumonia   Sepsis (HCC)   PNA (pneumonia)  COPD exacerbation in setting of immunocompromised status The patient is immunocompromised due to home medications of prednisone 20mg  daily and humira 2x month. She presented with productive cough, chest pain, and worsening shortness of breath. She was found to be febrile and tachycardic. Although CXR revealed very faint patchy RUL infiltrate, a more likely diagnosis is COPD exacerbation. She has severe COPD with PFT 02/13/2013 showing FEV1 of 49%. Her last hospitalization for COPD was  in 2015 and she feels this episode is very similar. Further questioning reveals that her son was not a sick contact; rather he was sick several months ago. Recent sick contacts include her grand daughter who is 33 months old and often has a URI. She is flu negative. Treatments - ABG shows increasing pCO2 -> BIPAP for 2 hours this AM, then repeat ABG - Change antibiotic to azithromycin 500 mg x1 today then 250 mg daily start tomorrow - Increase Duoneb to 0.5-2.5  scheduled q4h - Continue Solumderol 60 mg scheduled q6h - Decrease Albuterol to 2 puffs q4h PRN - Mucinex BID - Flonase daily (home med) - Claritin 10 mg daily (home med) Diagnostics - F/u sputum gram stain and culture (not yet collected) - F/u Strep pneumo urinary antigen (not yet collected) - F/u blood cultures x2 (sent)  Hypomagnesemia, resolved The patient presented with Mag of 1.3 and was treated in the ED with magnesium 2g and potassium chloride 40 mEq. Repeat Mag is 2.1  Anxiety The patient takes xanax 1 mg TID at home and is requesting xanax here as she feels very anxious and tachycardic. Reports she normally does take her xanax TID. Has a history of intentional overdose with alprazolam 2014.  - Xanax 0.5 mg TID - Restarted home trazodone 50 mg nightly    GERD Patient endorses intermittent substernal chest pain of a sharp nature. Has been told in the past this may be due to GERD. Takes pantoprazole 20mg  daily at home. Patient had an echo in 2014 showing EF 55-60% with grade 1 (mild) diastolic dysfunction. She has never had an MI. Troponin negative this admission. EKG showing sinus tachycardia - Pantoprazole 40 mg daily - Theophylline level to r/o chronic theophilline toxicity from home med  VTE prophylaxis Heparin 5000 units TID  This is a 2015 Note.  The care of the patient was discussed with Dr. Psychologist, occupational and the assessment and plan formulated with their assistance.  Please see their attached note for official  documentation of the daily encounter.   LOS: 1 day   Wendy [name], Med Student 11/23/2015, 9:54 AM

## 2015-11-23 NOTE — Progress Notes (Signed)
Pt refused CPT at this time.

## 2015-11-23 NOTE — Progress Notes (Signed)
Pt taken off Bipap and placed on [place] at 3.5 Lpm. Sat 99%.

## 2015-11-23 NOTE — Progress Notes (Signed)
Pt placed back on NIV at this time per MD due to ABG results, RT will continue to monitor.

## 2015-11-24 DIAGNOSIS — E662 Morbid (severe) obesity with alveolar hypoventilation: Secondary | ICD-10-CM

## 2015-11-24 DIAGNOSIS — J189 Pneumonia, unspecified organism: Secondary | ICD-10-CM

## 2015-11-24 DIAGNOSIS — J449 Chronic obstructive pulmonary disease, unspecified: Secondary | ICD-10-CM

## 2015-11-24 LAB — BLOOD GAS, ARTERIAL
ACID-BASE EXCESS: 11.4 mmol/L — AB (ref 0.0–2.0)
Bicarbonate: 37.6 mEq/L — ABNORMAL HIGH (ref 20.0–24.0)
Delivery systems: POSITIVE
Drawn by: 398661
EXPIRATORY PAP: 6
FIO2: 0.35
INSPIRATORY PAP: 16
Mode: POSITIVE
O2 Saturation: 96.3 %
PATIENT TEMPERATURE: 98.6
PCO2 ART: 73.9 mmHg — AB (ref 35.0–45.0)
PH ART: 7.327 — AB (ref 7.350–7.450)
PO2 ART: 87.5 mmHg (ref 80.0–100.0)
RATE: 16 resp/min
TCO2: 39.9 mmol/L (ref 0–100)

## 2015-11-24 LAB — PROCALCITONIN

## 2015-11-24 LAB — CBC
HEMATOCRIT: 36.2 % (ref 36.0–46.0)
Hemoglobin: 11.2 g/dL — ABNORMAL LOW (ref 12.0–15.0)
MCH: 29.5 pg (ref 26.0–34.0)
MCHC: 30.9 g/dL (ref 30.0–36.0)
MCV: 95.3 fL (ref 78.0–100.0)
Platelets: 248 10*3/uL (ref 150–400)
RBC: 3.8 MIL/uL — ABNORMAL LOW (ref 3.87–5.11)
RDW: 13.8 % (ref 11.5–15.5)
WBC: 7.2 10*3/uL (ref 4.0–10.5)

## 2015-11-24 LAB — BASIC METABOLIC PANEL
ANION GAP: 11 (ref 5–15)
BUN: 13 mg/dL (ref 6–20)
CALCIUM: 8.7 mg/dL — AB (ref 8.9–10.3)
CO2: 35 mmol/L — ABNORMAL HIGH (ref 22–32)
CREATININE: 0.78 mg/dL (ref 0.44–1.00)
Chloride: 90 mmol/L — ABNORMAL LOW (ref 101–111)
Glucose, Bld: 153 mg/dL — ABNORMAL HIGH (ref 65–99)
Potassium: 4.4 mmol/L (ref 3.5–5.1)
SODIUM: 136 mmol/L (ref 135–145)

## 2015-11-24 LAB — RESPIRATORY VIRUS PANEL
Adenovirus: NEGATIVE
INFLUENZA A: NEGATIVE
INFLUENZA B 1: NEGATIVE
Metapneumovirus: NEGATIVE
PARAINFLUENZA 1 A: NEGATIVE
PARAINFLUENZA 2 A: NEGATIVE
Parainfluenza 3: NEGATIVE
RESPIRATORY SYNCYTIAL VIRUS B: NEGATIVE
Respiratory Syncytial Virus A: NEGATIVE
Rhinovirus: NEGATIVE

## 2015-11-24 LAB — URINE CULTURE

## 2015-11-24 LAB — ADENOVIRUS ANTIBODIES: Adenovirus Antibody: NEGATIVE

## 2015-11-24 MED ORDER — PANTOPRAZOLE SODIUM 40 MG IV SOLR
40.0000 mg | Freq: Two times a day (BID) | INTRAVENOUS | Status: DC
  Administered 2015-11-24 – 2015-11-26 (×6): 40 mg via INTRAVENOUS
  Filled 2015-11-24 (×7): qty 40

## 2015-11-24 MED ORDER — CHLORHEXIDINE GLUCONATE 0.12 % MT SOLN
15.0000 mL | Freq: Two times a day (BID) | OROMUCOSAL | Status: DC
  Administered 2015-11-24 – 2015-11-30 (×11): 15 mL via OROMUCOSAL
  Filled 2015-11-24 (×12): qty 15

## 2015-11-24 MED ORDER — AZELASTINE HCL 0.1 % NA SOLN
2.0000 | Freq: Two times a day (BID) | NASAL | Status: DC
  Administered 2015-11-24 – 2015-11-30 (×12): 2 via NASAL
  Filled 2015-11-24: qty 30

## 2015-11-24 MED ORDER — METHYLPREDNISOLONE SODIUM SUCC 40 MG IJ SOLR
40.0000 mg | Freq: Four times a day (QID) | INTRAMUSCULAR | Status: DC
  Administered 2015-11-24 – 2015-11-26 (×8): 40 mg via INTRAVENOUS
  Filled 2015-11-24 (×7): qty 1

## 2015-11-24 MED ORDER — HYDROCOD POLST-CPM POLST ER 10-8 MG/5ML PO SUER
5.0000 mL | Freq: Two times a day (BID) | ORAL | Status: DC
  Administered 2015-11-24 – 2015-11-30 (×13): 5 mL via ORAL
  Filled 2015-11-24 (×14): qty 5

## 2015-11-24 MED ORDER — FAMOTIDINE IN NACL 20-0.9 MG/50ML-% IV SOLN
20.0000 mg | INTRAVENOUS | Status: DC
  Administered 2015-11-24 – 2015-11-26 (×3): 20 mg via INTRAVENOUS
  Filled 2015-11-24 (×3): qty 50

## 2015-11-24 MED ORDER — CETYLPYRIDINIUM CHLORIDE 0.05 % MT LIQD
7.0000 mL | Freq: Two times a day (BID) | OROMUCOSAL | Status: DC
  Administered 2015-11-24 – 2015-11-28 (×5): 7 mL via OROMUCOSAL

## 2015-11-24 MED ORDER — FLUTICASONE PROPIONATE 50 MCG/ACT NA SUSP
2.0000 | Freq: Two times a day (BID) | NASAL | Status: DC
  Administered 2015-11-24 – 2015-11-30 (×11): 2 via NASAL
  Filled 2015-11-24: qty 16

## 2015-11-24 MED ORDER — LORAZEPAM 2 MG/ML IJ SOLN
0.5000 mg | Freq: Once | INTRAMUSCULAR | Status: AC
  Administered 2015-11-24: 0.5 mg via INTRAVENOUS
  Filled 2015-11-24: qty 1

## 2015-11-24 NOTE — Consult Note (Signed)
PULMONARY / CRITICAL CARE MEDICINE   Name: Wendy [name] MRN: [id number] DOB: 14-Dec-1960    ADMISSION DATE:  11/22/2015 CONSULTATION DATE: 3/9  REFERRING MD: Daiva Eves   CHIEF COMPLAINT:  SOB - Difficulty to wean off BIPAP  HISTORY OF PRESENT ILLNESS:   55 y/o F with past medical history of CHF (grade 1 diastolic dysfunction, EF 55-60% 09/2013), COPD on home oxygen, and depression who presents on 3/7 w/ productive cough, SOB, and chest pain for the past 5-7 days. Cough has been productive of yellow sputum. She has tried increasing her home O2 from her normal 3L to 3.5L and her home inhalers without any improvement in her breathing. She had a measure temperature of 1065F yesterday at home as well as one episode of non bloody emesis this morning. She lives with her son who has down syndrome and states he was recently sick. She has a hx of acid reflux and takes over the counter rolaids. She follows with Dr. Maple Hudson w/ pulmonology and is on chronic steroids since 2015. She is also on Humira for her plaque psoriasis.   In the ED, she had a temperature of 102.65F, RR 23, and pulse 135. Chest xray revealed a patchy right upper lobe infiltrate concerning for pneumonia. Lactic acid was normal. Her blood pressure on presentation was 100/62 down to 83/67. She given 1.5L of NS, duoneb x1, IV solumedrol 125mg , and started on aztreonam and vancomycin for pneumonia.   PCCM to consult on 3/9 for difficulty weaning BIPAP    PAST MEDICAL HISTORY :  She  has a past medical history of Asthma; Seasonal allergies; Poor dentition; Alcoholism (HCC); COPD (chronic obstructive pulmonary disease) (HCC); Depression; Oxygen dependent; Urinary, incontinence, stress female; and Psoriasis.  PAST SURGICAL HISTORY: She  has past surgical history that includes Cesarean section and Tubal ligation.  Allergies  Allergen Reactions  . Augmentin [Amoxicillin-Pot Clavulanate] Nausea Only  . Cefdinir Other (See Comments)    Aches,  "heart fluttering"  . Codeine Nausea Only  . Fexofenadine Nausea Only    allegra  . Other     PAIN MEDICATIONS-nausea    No current facility-administered medications on file prior to encounter.   Current Outpatient Prescriptions on File Prior to Encounter  Medication Sig  . ADVAIR DISKUS 500-50 MCG/DOSE AEPB INHALE 1 PUFF INTO THE LUNGS 2 TIMES DAILY  . albuterol (PROAIR HFA) 108 (90 BASE) MCG/ACT inhaler 2 puffs every 4 hours as needed- rescue (Patient taking differently: Inhale 2 puffs into the lungs every 4 (four) hours as needed for wheezing. 2 puffs every 4 hours as needed- rescue)  . ALPRAZolam (XANAX) 1 MG tablet 1 tab, three times daily as needed (Patient taking differently: Take 1 mg by mouth 3 (three) times daily as needed for anxiety. 1 tab, three times daily as needed)  . fluticasone (FLONASE) 50 MCG/ACT nasal spray PLACE 2 SPRAYS INTO BOTH NOSTRILS DAILY.  . pantoprazole (PROTONIX) 20 MG tablet TAKE 1 TABLET (20 MG TOTAL) BY MOUTH DAILY BEFORE SUPPER.  . predniSONE (DELTASONE) 10 MG tablet TAKE 1 OR 2 TABLETS DAILY AS NEEDED  . SPIRIVA HANDIHALER 18 MCG inhalation capsule PLACE 1 CAPSULE (18 MCG TOTAL) INTO INHALER AND INHALE DAILY.  5/9 theophylline (UNIPHYL) 400 MG 24 hr tablet Take 0.5 tablets (200 mg total) by mouth 2 (two) times daily with a meal.  . traZODone (DESYREL) 50 MG tablet Take 1 tablet (50 mg total) by mouth at bedtime.  . Adalimumab (HUMIRA PEN-CROHNS STARTER) 40 MG/0.8ML  PNKT Inject 40 mg into the skin every 14 (fourteen) days.  Marland Kitchen albuterol (PROVENTIL) (2.5 MG/3ML) 0.083% nebulizer solution Take 2.5 mg by nebulization every 6 (six) hours as needed for wheezing or shortness of breath.  . furosemide (LASIX) 20 MG tablet 1 tablet every other day as needed (Patient not taking: Reported on 11/22/2015)    FAMILY HISTORY:  Her reported the following about her mother: alive and well. She indicated that her father is deceased.   SOCIAL HISTORY: She  reports that she  quit smoking about 2 years ago. Her smoking use included Cigarettes. She started smoking about 45 years ago. She has a 40 pack-year smoking history. She has never used smokeless tobacco. She reports that she does not drink alcohol or use illicit drugs.  REVIEW OF SYSTEMS:     SUBJECTIVE:  Patient in bed, resting on BIPAP, alert and oriented, no acute distress   VITAL SIGNS: BP 118/69 mmHg  Pulse 98  Temp(Src) 97.4 F (36.3 C) (Oral)  Resp 16  Ht 5\' 1"  (1.549 m)  Wt 152 lb (68.947 kg)  BMI 28.74 kg/m2  SpO2 98%  HEMODYNAMICS:    VENTILATOR SETTINGS: Vent Mode:  [-] BIPAP FiO2 (%):  [28 %-50 %] 30 % Set Rate:  [10 bmp-16 bmp] 16 bmp PEEP:  [6 cmH20-7 cmH20] 6 cmH20  INTAKE / OUTPUT: I/O last 3 completed shifts: In: 290 [Other:40; IV Piggyback:250] Out: -   PHYSICAL EXAMINATION: General: Adult female, alert, comfortable on BIPAP Neuro:  Alert and oriented, normal strength and tone  HEENT: Normocephalic, atraumatic, vocal hoarseness & marked upper airway wheeze  Cardiovascular: no MRG, Tachycardic, S1, S2 noted  Lungs: occasional rhonchi, exp wheeze Abdomen: Distended, active bowel sounds Musculoskeletal:  No acute deformities  Skin: Warm, Dry, Intact   LABS:  BMET  Recent Labs Lab 11/22/15 0800 11/23/15 0330 11/24/15 0943  NA 139 137 136  K 3.2* 4.7 4.4  CL 92* 94* 90*  CO2 36* 34* 35*  BUN 6 7 13   CREATININE 0.86 0.62 0.78  GLUCOSE 109* 132* 153*    Electrolytes  Recent Labs Lab 11/22/15 0800 11/22/15 0935 11/23/15 0330 11/23/15 0852 11/24/15 0943  CALCIUM 9.0  --  8.4*  --  8.7*  MG  --  1.3*  --  2.1  --     CBC  Recent Labs Lab 11/22/15 0800 11/23/15 0330 11/24/15 0943  WBC 9.5 8.4 7.2  HGB 11.4* 10.7* 11.2*  HCT 36.5 36.4 36.2  PLT 216 221 248    Coag's  Recent Labs Lab 11/22/15 0800  INR 0.93    Sepsis Markers  Recent Labs Lab 11/22/15 0811 11/22/15 1140 11/22/15 1707 11/24/15 0520  LATICACIDVEN 1.50 0.33*  --    --   PROCALCITON  --   --  <0.10 <0.10    ABG  Recent Labs Lab 11/23/15 1211 11/23/15 1458 11/23/15 2218  PHART 7.219* 7.260* 7.327*  PCO2ART 95.4* 88.5* 73.9*  PO2ART 73.1* 80.8 87.5    Liver Enzymes  Recent Labs Lab 11/22/15 0800  AST 20  ALT 14  ALKPHOS 40  BILITOT 0.2*  ALBUMIN 3.7    Cardiac Enzymes  Recent Labs Lab 11/22/15 1654  TROPONINI 0.03    Glucose No results for input(s): GLUCAP in the last 168 hours.  Imaging Dg Chest Port 1 View  11/23/2015  CLINICAL DATA:  Shortness of breath.  History of COPD, asthma EXAM: PORTABLE CHEST 1 VIEW COMPARISON:  11/22/2015 FINDINGS: Patchy right upper lobe opacity  again noted, similar to prior study. No confluent opacity on the left. Heart is borderline in size. No effusions or acute bony abnormality. IMPRESSION: Patchy right upper lobe opacity again noted, possibly early infiltrate/pneumonia. Recommend follow-up to resolution. Electronically Signed   By: Charlett Nose M.D.   On: 11/23/2015 13:29     STUDIES:  3/7 CXR >> Patchy right upper lobe airspace disease 3/8 CXR >> Patchy right upper lobe opacity, possibly early infiltrate/PNA   CULTURES: 3/7 Blood x 2 >>> 3/7 Urine  >> 3/7 RVP >> 3/7 Sputum >>>   ANTIBIOTICS: Azithromycin 3/7 >>> Azactam 3/7 >>> 3/7 Vancomycin 3/7 >> 3/7   DISCUSSION:   ASSESSMENT / PLAN:  Acute on chronic hypoxic respiratory failure in setting of acute COPD exacerbation, +/- CAP.  Suspect viral; but symptom burden likely exacerbated by LPR w/ significant upper airway inflammation in setting of Post-nasal gtt and poorly controlled GERD.  Plan  Continue BIPAP PRN, wean as tolerated with supplemental oxygen  Dc abg--follow clinically as that would force our hands for intubation more than abg Follow up CXR tomorrow  Follow up with RVP panel  Continue azithromycin  Continue duoneb q4h Continue Flonase daily (home med)-->increased to BID Added tussinex as firt gen H blockers  usually a little better for PND and will give added support of cough suppression  Cont BDs Change po PPI to IV q 12 hours & add H2 blockade   Simonne Martinet ACNP-BC [facility] Pulmonary/Critical Care Pager # [phone] OR # [phone] if no answer 7  11/24/2015, 11:34 AM

## 2015-11-24 NOTE — Progress Notes (Signed)
Patient was changed from her Bipap/MD orders .Patient was placed on 02 at 3l sats started dropping Getting more cynantic  Less then ten minutes after and she had just had breathing treatment . RT place back on bipap after sats dropping and her respiratory status changed.

## 2015-11-24 NOTE — Care Management Note (Signed)
Case Management Note  Patient Details  Name: Wendy [name] MRN: [id number] Date of Birth: [birth year]  Subjective/Objective:       Date: 11/24/15 Spoke with patient at the bedside.  Introduced self as Sports coach and explained role in discharge planning and how to be reached.  Verified patient lives in town, with spouse or roommate, Has home oxygen through [facility] 3/5 liters. Expressed potential need for no other DME.  Verified patient anticipates to go home with family, at time of discharge and will have full-time supervision by family at this time to best of their knowledge.  Patient denied needing help with their medication.  Patient drives and also is driven by daughter if needed to MD appointments.  Verified patient has PCP [place] at [facility]. She is currently active with [facility] for [facility] , per Lupita Leash with [facility] states patient is not active with [facility] at this time. She has transportation at [employer].    Plan: CM will continue to follow for discharge planning and [facility] resources.              Action/Plan:   Expected Discharge Date:                  Expected Discharge Plan:  Home w Home Health Services  In-House Referral:     Discharge planning Services  CM Consult  Post Acute Care Choice:  Home Health, Resumption of Svcs/PTA Provider Choice offered to:     DME Arranged:    DME Agency:     HH Arranged:  RN HH Agency:     Status of Service:  In process, will continue to follow  Medicare Important Message Given:    Date Medicare IM Given:    Medicare IM give by:    Date Additional Medicare IM Given:    Additional Medicare Important Message give by:     If discussed at Long Length of Stay Meetings, dates discussed:    Additional Comments:  Leone Haven, RN 11/24/2015, 1:56 PM

## 2015-11-24 NOTE — Progress Notes (Signed)
Subjective: This morning patient was breathing comfortably on BIPAP. BIPAP was removed and pt placed on 3L [place] and titirated down to 92% O2 sat. Later per grandson pt had an episode where pt could not catch her breath and turned blue which he thinks was a panic attack. Pt was placed on BIPAP. On re evaluation of patient she was breathing comfortable on BIPAP and able to speak in full sentences during rounds.   Objective: Vital signs in last 24 hours: Filed Vitals:   11/24/15 0717 11/24/15 0725 11/24/15 0811 11/24/15 0838  BP:    118/69  Pulse:  90  98  Temp:    97.4 F (36.3 C)  TempSrc:    Oral  Resp:  20  16  Height:      Weight:      SpO2: 97% 97% 80% 98%   Weight change:   Intake/Output Summary (Last 24 hours) at 11/24/15 1111 Last data filed at 11/24/15 0500  Gross per 24 hour  Intake     40 ml  Output      0 ml  Net     40 ml   General: NAD, obese, wearing BIPAP Lungs: improved air movement from yesterday, very faint wheezing Cardiac: RRR, no murmurs GI: soft, active bowel sounds Neuro: CN II-XII grossly intact Skin: warm and dry Ext: trace pedal edema  Lab Results: Basic Metabolic Panel:  Recent Labs Lab 11/22/15 0935 11/23/15 0330 11/23/15 0852 11/24/15 0943  NA  --  137  --  136  K  --  4.7  --  4.4  CL  --  94*  --  90*  CO2  --  34*  --  35*  GLUCOSE  --  132*  --  153*  BUN  --  7  --  13  CREATININE  --  0.62  --  0.78  CALCIUM  --  8.4*  --  8.7*  MG 1.3*  --  2.1  --    Liver Function Tests:  Recent Labs Lab 11/22/15 0800  AST 20  ALT 14  ALKPHOS 40  BILITOT 0.2*  PROT 6.3*  ALBUMIN 3.7   CBC:  Recent Labs Lab 11/23/15 0330 11/24/15 0943  WBC 8.4 7.2  HGB 10.7* 11.2*  HCT 36.4 36.2  MCV 95.8 95.3  PLT 221 248   Cardiac Enzymes:  Recent Labs Lab 11/22/15 1654  TROPONINI 0.03      Micro Results: Recent Results (from the past 240 hour(s))  Blood Culture (routine x 2)     Status: None (Preliminary result)   Collection Time: 11/22/15  8:00 AM  Result Value Ref Range Status   Specimen Description BLOOD RIGHT HAND  Final   Special Requests BOTTLES DRAWN AEROBIC AND ANAEROBIC 5CC  Final   Culture NO GROWTH 1 DAY  Final   Report Status PENDING  Incomplete  Blood Culture (routine x 2)     Status: None (Preliminary result)   Collection Time: 11/22/15  8:58 AM  Result Value Ref Range Status   Specimen Description BLOOD LEFT ANTECUBITAL  Final   Special Requests BOTTLES DRAWN AEROBIC AND ANAEROBIC 5CC  Final   Culture NO GROWTH 1 DAY  Final   Report Status PENDING  Incomplete  Urine culture     Status: None (Preliminary result)   Collection Time: 11/22/15  1:30 PM  Result Value Ref Range Status   Specimen Description URINE, CLEAN CATCH  Final   Special Requests NONE  Final   Culture TOO YOUNG TO READ  Final   Report Status PENDING  Incomplete  MRSA PCR Screening     Status: None   Collection Time: 11/23/15  2:04 AM  Result Value Ref Range Status   MRSA by PCR NEGATIVE NEGATIVE Final    Comment:        The GeneXpert MRSA Assay (FDA approved for NASAL specimens only), is one component of a comprehensive MRSA colonization surveillance program. It is not intended to diagnose MRSA infection nor to guide or monitor treatment for MRSA infections.    Studies/Results: Dg Chest Port 1 View  11/23/2015  CLINICAL DATA:  Shortness of breath.  History of COPD, asthma EXAM: PORTABLE CHEST 1 VIEW COMPARISON:  11/22/2015 FINDINGS: Patchy right upper lobe opacity again noted, similar to prior study. No confluent opacity on the left. Heart is borderline in size. No effusions or acute bony abnormality. IMPRESSION: Patchy right upper lobe opacity again noted, possibly early infiltrate/pneumonia. Recommend follow-up to resolution. Electronically Signed   By: Charlett Nose M.D.   On: 11/23/2015 13:29   Medications: I have reviewed the patient's current medications. Scheduled Meds: . antiseptic oral rinse  7 mL  Mouth Rinse q12n4p  . azithromycin  250 mg Intravenous Q24H  . chlorhexidine  15 mL Mouth Rinse BID  . dextromethorphan-guaiFENesin  1 tablet Oral BID  . fluticasone  2 spray Each Nare Daily  . heparin  5,000 Units Subcutaneous 3 times per day  . ipratropium-albuterol  3 mL Nebulization Q4H  . loratadine  10 mg Oral Daily  . methylPREDNISolone (SOLU-MEDROL) injection  60 mg Intravenous Q6H  . pantoprazole  40 mg Oral q morning - 10a   Continuous Infusions:  PRN Meds:.acetaminophen, albuterol, ALPRAZolam, ondansetron (ZOFRAN) IV Assessment/Plan: Active Problems:   Psoriasis   Pneumonia   Sepsis (HCC)   PNA (pneumonia)   Community acquired pneumonia   Cough   Hypercapnic respiratory failure, chronic (HCC)   OSA (obstructive sleep apnea)   COPD, severe (HCC)   Immunosuppressed status (HCC)   Acute COPD exacerbation-- Pt requiring BIPAP since yesterday afternoon. Her ABGs have improved, however she did not tolerate being off of BIPAP likely 2/2 her anxiety. Pt has better air movement and minimal wheezing this morning.  - continue IV azithromycin   - duonebs q4h, albuterol inhaler q2h prn, and albuterol neb q4h prn - solumedrol 60mg  q6 decreased to 40mg  q5h - ordered sputum culture and gram stain - continue home mucinex DM - oxygen therapy to maintain O2 sats between 88-92% - consulted PCCM due to difficulty w/ weaning off of BIPAP  Anxiety-- hx of intentional benzo overdose in 2014 that did not require intubation. She is on xanax 1mg  TID at home.  - xanax 0.5mg  TID  FEN - HH diet  Code: FULL code, she states she would not want to be intubated if she would have to stay on the ventilator for a prolonged period of time.   Dispo: Disposition is deferred at this time, awaiting improvement of current medical problems. The patient does have a current PCP ( , MD) and does not need an [facility] hospital follow-up appointment after discharge.   .Services Needed at time of  discharge: Y = Yes, Blank = No PT:   OT:   RN:   Equipment:   Other:     LOS: 2 days   , MD 11/24/2015, 11:11 AM

## 2015-11-24 NOTE — Progress Notes (Signed)
Pt continues to refuse CPT. 

## 2015-11-24 NOTE — Progress Notes (Signed)
Subjective: Patient feels anxious and worried about her breathing. Confirms that she wants to be full code and would like to be intubated if necessary. Work of breathing is improved, patient is on bipap.  Objective: Vital signs in last 24 hours: Filed Vitals:   11/24/15 0717 11/24/15 0725 11/24/15 0811 11/24/15 0838  BP:    118/69  Pulse:  90  98  Temp:    97.4 F (36.3 C)  TempSrc:    Oral  Resp:  20  16  Height:      Weight:      SpO2: 97% 97% 80% 98%   Weight change:   Intake/Output Summary (Last 24 hours) at 11/24/15 1119 Last data filed at 11/24/15 0500  Gross per 24 hour  Intake     40 ml  Output      0 ml  Net     40 ml    Physical Exam General Appearance:    Alert, cooperative, anxious. Appears slightly uncomfortable but improved on bipap.  Head:    Normocephalic, without obvious abnormality, atraumatic  Back:     Symmetric, no curvature, ROM normal, no CVA tenderness  Lungs:     Breath sounds are diminished, but wheezing has resolved. Breathing is still tight but she is able to take a bigger breath compared to yesterday.  Chest Wall:    No tenderness or deformity   Heart:    Tachycardic to 112. S1 and S2 normal, no murmur, rub   or gallop  Abdomen:     Distended, cushing-oid. Non-tender, no masses  Extremities:   Extremities normal, atraumatic, no cyanosis or edema  Skin:   Skin color, texture, turgor normal, no rashes or lesions  Neurologic:   Alert and oriented, normal strength, sensation throughout   Lab Results: Basic Metabolic Panel:  Recent Labs  33/38/32 0935 11/23/15 0330 11/23/15 0852 11/24/15 0943  NA  --  137  --  136  K  --  4.7  --  4.4  CL  --  94*  --  90*  CO2  --  34*  --  35*  GLUCOSE  --  132*  --  153*  BUN  --  7  --  13  CREATININE  --  0.62  --  0.78  CALCIUM  --  8.4*  --  8.7*  MG 1.3*  --  2.1  --    Liver Function Tests:  Recent Labs  11/22/15 0800  AST 20  ALT 14  ALKPHOS 40  BILITOT 0.2*  PROT 6.3*  ALBUMIN  3.7   No results for input(s): LIPASE, AMYLASE in the last 72 hours. No results for input(s): AMMONIA in the last 72 hours. CBC:  Recent Labs  11/23/15 0330 11/24/15 0943  WBC 8.4 7.2  HGB 10.7* 11.2*  HCT 36.4 36.2  MCV 95.8 95.3  PLT 221 248   Cardiac Enzymes:  Recent Labs  11/22/15 1654  TROPONINI 0.03   BNP: No results for input(s): PROBNP in the last 72 hours. D-Dimer: No results for input(s): DDIMER in the last 72 hours. CBG: No results for input(s): GLUCAP in the last 72 hours. Hemoglobin A1C: No results for input(s): HGBA1C in the last 72 hours. Fasting Lipid Panel: No results for input(s): CHOL, HDL, LDLCALC, TRIG, CHOLHDL, LDLDIRECT in the last 72 hours. Thyroid Function Tests: No results for input(s): TSH, T4TOTAL, FREET4, T3FREE, THYROIDAB in the last 72 hours. Anemia Panel: No results for input(s): VITAMINB12, FOLATE, FERRITIN,  TIBC, IRON, RETICCTPCT in the last 72 hours. Coagulation:  Recent Labs  11/22/15 0800  LABPROT 12.7  INR 0.93   Urine Drug Screen: Drugs of Abuse     Component Value Date/Time   LABOPIA NONE DETECTED 11/27/2012 0017   COCAINSCRNUR NONE DETECTED 11/27/2012 0017   LABBENZ POSITIVE* 11/27/2012 0017   AMPHETMU NONE DETECTED 11/27/2012 0017   THCU NONE DETECTED 11/27/2012 0017   LABBARB NONE DETECTED 11/27/2012 0017    Alcohol Level:  Recent Labs  11/22/15 0800  ETH <5   Urinalysis:  Recent Labs  11/22/15 1320  COLORURINE STRAW*  LABSPEC 1.004*  PHURINE 5.5  GLUCOSEU NEGATIVE  HGBUR NEGATIVE  BILIRUBINUR NEGATIVE  KETONESUR NEGATIVE  PROTEINUR NEGATIVE  NITRITE NEGATIVE  LEUKOCYTESUR NEGATIVE     Micro Results: Recent Results (from the past 240 hour(s))  Blood Culture (routine x 2)     Status: None (Preliminary result)   Collection Time: 11/22/15  8:00 AM  Result Value Ref Range Status   Specimen Description BLOOD RIGHT HAND  Final   Special Requests BOTTLES DRAWN AEROBIC AND ANAEROBIC 5CC  Final    Culture NO GROWTH 1 DAY  Final   Report Status PENDING  Incomplete  Blood Culture (routine x 2)     Status: None (Preliminary result)   Collection Time: 11/22/15  8:58 AM  Result Value Ref Range Status   Specimen Description BLOOD LEFT ANTECUBITAL  Final   Special Requests BOTTLES DRAWN AEROBIC AND ANAEROBIC 5CC  Final   Culture NO GROWTH 1 DAY  Final   Report Status PENDING  Incomplete  Urine culture     Status: None (Preliminary result)   Collection Time: 11/22/15  1:30 PM  Result Value Ref Range Status   Specimen Description URINE, CLEAN CATCH  Final   Special Requests NONE  Final   Culture TOO YOUNG TO READ  Final   Report Status PENDING  Incomplete  MRSA PCR Screening     Status: None   Collection Time: 11/23/15  2:04 AM  Result Value Ref Range Status   MRSA by PCR NEGATIVE NEGATIVE Final    Comment:        The GeneXpert MRSA Assay (FDA approved for NASAL specimens only), is one component of a comprehensive MRSA colonization surveillance program. It is not intended to diagnose MRSA infection nor to guide or monitor treatment for MRSA infections.    Studies/Results: Dg Chest Port 1 View  11/23/2015  CLINICAL DATA:  Shortness of breath.  History of COPD, asthma EXAM: PORTABLE CHEST 1 VIEW COMPARISON:  11/22/2015 FINDINGS: Patchy right upper lobe opacity again noted, similar to prior study. No confluent opacity on the left. Heart is borderline in size. No effusions or acute bony abnormality. IMPRESSION: Patchy right upper lobe opacity again noted, possibly early infiltrate/pneumonia. Recommend follow-up to resolution. Electronically Signed   By: Charlett Nose M.D.   On: 11/23/2015 13:29   Medications:  . antiseptic oral rinse  7 mL Mouth Rinse q12n4p  . azithromycin  250 mg Intravenous Q24H  . chlorhexidine  15 mL Mouth Rinse BID  . dextromethorphan-guaiFENesin  1 tablet Oral BID  . fluticasone  2 spray Each Nare Daily  . heparin  5,000 Units Subcutaneous 3 times per day  .  ipratropium-albuterol  3 mL Nebulization Q4H  . loratadine  10 mg Oral Daily  . methylPREDNISolone (SOLU-MEDROL) injection  60 mg Intravenous Q6H  . pantoprazole  40 mg Oral q morning - 10a   acetaminophen,  albuterol, ALPRAZolam, ondansetron (ZOFRAN) IV  Assessment/Plan: Active Problems:   Psoriasis   Pneumonia   Sepsis (HCC)   PNA (pneumonia)   Community acquired pneumonia   Cough   Hypercapnic respiratory failure, chronic (HCC)   OSA (obstructive sleep apnea)   COPD, severe (HCC)   Immunosuppressed status (HCC)  COPD exacerbation in setting of immunocompromised status The patient is immunocompromised due to home medications of prednisone 20mg  daily and humira 2x month. She presented with productive cough, chest pain, and worsening shortness of breath. She was found to be febrile and tachycardic. Although CXR revealed very faint patchy RUL infiltrate, a more likely diagnosis is COPD exacerbation. Repeat CXR showed stable, non-worsening infiltrate. The patient has severe COPD with PFT 02/13/2013 showing FEV1 of 49%. Her last hospitalization for COPD was in 2015 and she feels this episode is very similar. Recent sick contacts include her grand daughter who is 54 months old and often has a URI. She is flu negative. Updates Patient had ABG with pCO2 of 95 yesterday. Then went on bipap for several hours, repeat ABG was improved with pCo2 74. She was on bipap all night. This AM, was feeling very comfortable, so was taken off bipap. Initially felt well. Then after ~10 minutes had a desat to 79% where she turned blue and felt like she couldn't breath. This was witnessed by her grandson, who was in the room and called the nurse for help. The patient is feeling very anxious after this episode. Back on bipap now. Treatments - Continue Bipap this AM - Consult to PCCM; thank you - Consider repeat ABG this afternoon followed by trial off bipap - Continue azithromycin 250 mg daily - Continue duoneb  0.5-2.5 scheduled q4h - Decreased Solumderol to 40 mg scheduled q6h - Continue Albuterol to 2 puffs q4h PRN - Continue Mucinex BID - Continue Flonase daily (home med) - Continue Claritin 10 mg daily (home med) - Tylenol 650 mg q6h prn Diagnostics - F/u sputum gram stain and culture (not yet collected) - F/u Strep pneumo urinary antigen (not yet collected) - F/u blood cultures x2 (sent)  Anxiety The patient is very anxious and tachycardic, asking frequently for something to help her relax. Takes xanax 1 mg TID at home. Has a history of intentional overdose with alprazolam in 2014.  - Continue Xanax 0.5 mg TID prn; patient may have her xanax TID with sips of water even if on bipap - Continue home trazodone 50 mg nightly   GERD At admission, the patient endorsed intermittent substernal chest pain of a sharp nature. She has been told in the past that this may be due to GERD. Takes pantoprazole 20mg  daily at home. Patient had an echo in 2014 showing EF 55-60% with grade 1 (mild) diastolic dysfunction. She has never had an MI. Troponin negative this admission. EKG showing sinus tachycardia - Continue Pantoprazole 40 mg daily - Theophylline level pending (r/o chronic theophilline toxicity from home med)  VTE prophylaxis Heparin 5000 units TID  This is a Note.  The care of the patient was discussed with Dr. 2015 and the assessment and plan formulated with their assistance.  Please see their attached note for official documentation of the daily encounter.   LOS: 2 days   Psychologist, occupational, Med Student 11/24/2015, 11:19 AM

## 2015-11-25 DIAGNOSIS — R0789 Other chest pain: Secondary | ICD-10-CM

## 2015-11-25 DIAGNOSIS — J9621 Acute and chronic respiratory failure with hypoxia: Secondary | ICD-10-CM

## 2015-11-25 DIAGNOSIS — J9622 Acute and chronic respiratory failure with hypercapnia: Secondary | ICD-10-CM

## 2015-11-25 LAB — EXPECTORATED SPUTUM ASSESSMENT W GRAM STAIN, RFLX TO RESP C

## 2015-11-25 LAB — TROPONIN I

## 2015-11-25 MED ORDER — DICLOFENAC SODIUM 1 % TD GEL
4.0000 g | Freq: Four times a day (QID) | TRANSDERMAL | Status: DC
  Administered 2015-11-25 – 2015-11-28 (×5): 4 g via TOPICAL
  Filled 2015-11-25: qty 100

## 2015-11-25 MED ORDER — ALPRAZOLAM 0.5 MG PO TABS
1.0000 mg | ORAL_TABLET | Freq: Three times a day (TID) | ORAL | Status: DC | PRN
  Administered 2015-11-25 – 2015-11-30 (×15): 1 mg via ORAL
  Filled 2015-11-25 (×15): qty 2

## 2015-11-25 MED ORDER — NITROGLYCERIN 0.4 MG SL SUBL
0.4000 mg | SUBLINGUAL_TABLET | SUBLINGUAL | Status: DC | PRN
Start: 2015-11-25 — End: 2015-11-30
  Administered 2015-11-25: 0.4 mg via SUBLINGUAL
  Filled 2015-11-25: qty 25

## 2015-11-25 NOTE — Progress Notes (Signed)
Paged IM intern at 1100 regarding pt complaining of sharp, stabbing chest pain under left breast, not radiating. Will obtain EKG per protocol. Will continue to monitor.

## 2015-11-25 NOTE — Progress Notes (Signed)
Subjective: Patient went off Bipap yesterday afternoon and stayed off for most of the night. At ~4 AM became SOB and went back on Bipap for 4 hours. Then ~8 AM back off bipap. This morning ~10 AM while off bipap the patient had an episode of substernal chest pain radiating to her back associated with increased shortness of breath. She received sublingual nitro. On exam the pain was reproducible with palpation, especially in the epigastric area. STAT EKG was normal, troponin <0.03. Pain felt to be MSK vs GERD vs anxiety.  Objective: Vital signs in last 24 hours: Filed Vitals:   11/25/15 0400 11/25/15 0754 11/25/15 0800 11/25/15 0830  BP: 138/82  147/79   Pulse: 83  84   Temp: 97.6 F (36.4 C)  98.3 F (36.8 C)   TempSrc: Axillary  Oral   Resp: 13  17   Height:      Weight:      SpO2: 96% 99% 98% 98%   Weight change:   Intake/Output Summary (Last 24 hours) at 11/25/15 1150 Last data filed at 11/25/15 0900  Gross per 24 hour  Intake    170 ml  Output    800 ml  Net   -630 ml    Physical Exam General Appearance:    Sitting up in bed, anxious. Slightly distressed. Alert and oriented.  Head:    Normocephalic, without obvious abnormality, atraumatic  Eyes:    conjunctiva/corneas clear, EOM's intact  Nose:   Nares normal,no rhinorrhe  Throat:   Lips, mucosa, and tongue normal; teeth and gums normal  Lungs:     Clear to auscultation bilaterally, she is tachypneic but respirations are unlabored with good respiratory effort  Chest Wall:    Tender to palpation in the substernal and epigastric area   Heart:    Tachycardic to 90 BPM. Regular rate. S1 and S2 normal, no murmur, rub  or gallop. No signs of cardiac ischemia on tele.  Abdomen:     Distended, cushingoid. Tender in the epigastric area. No masses, no organomegaly  Extremities:   Extremities normal, atraumatic, no cyanosis or edema  Skin:   Skin color, texture, turgor normal, no rashes or lesions  Neurologic:   Alert and oriented.    Lab Results: Basic Metabolic Panel:  Recent Labs  24/23/53 0330 11/23/15 0852 11/24/15 0943  NA 137  --  136  K 4.7  --  4.4  CL 94*  --  90*  CO2 34*  --  35*  GLUCOSE 132*  --  153*  BUN 7  --  13  CREATININE 0.62  --  0.78  CALCIUM 8.4*  --  8.7*  MG  --  2.1  --    Liver Function Tests: No results for input(s): AST, ALT, ALKPHOS, BILITOT, PROT, ALBUMIN in the last 72 hours. No results for input(s): LIPASE, AMYLASE in the last 72 hours. No results for input(s): AMMONIA in the last 72 hours. CBC:  Recent Labs  11/23/15 0330 11/24/15 0943  WBC 8.4 7.2  HGB 10.7* 11.2*  HCT 36.4 36.2  MCV 95.8 95.3  PLT 221 248   Cardiac Enzymes:  Recent Labs  11/22/15 1654  TROPONINI 0.03   BNP: No results for input(s): PROBNP in the last 72 hours. D-Dimer: No results for input(s): DDIMER in the last 72 hours. CBG: No results for input(s): GLUCAP in the last 72 hours. Hemoglobin A1C: No results for input(s): HGBA1C in the last 72 hours. Fasting Lipid Panel: No  results for input(s): CHOL, HDL, LDLCALC, TRIG, CHOLHDL, LDLDIRECT in the last 72 hours. Thyroid Function Tests: No results for input(s): TSH, T4TOTAL, FREET4, T3FREE, THYROIDAB in the last 72 hours. Anemia Panel: No results for input(s): VITAMINB12, FOLATE, FERRITIN, TIBC, IRON, RETICCTPCT in the last 72 hours. Coagulation: No results for input(s): LABPROT, INR in the last 72 hours. Urine Drug Screen: Drugs of Abuse     Component Value Date/Time   LABOPIA NONE DETECTED 11/27/2012 0017   COCAINSCRNUR NONE DETECTED 11/27/2012 0017   LABBENZ POSITIVE* 11/27/2012 0017   AMPHETMU NONE DETECTED 11/27/2012 0017   THCU NONE DETECTED 11/27/2012 0017   LABBARB NONE DETECTED 11/27/2012 0017    Alcohol Level: No results for input(s): ETH in the last 72 hours. Urinalysis:  Recent Labs  11/22/15 1320  COLORURINE STRAW*  LABSPEC 1.004*  PHURINE 5.5  GLUCOSEU NEGATIVE  HGBUR NEGATIVE  BILIRUBINUR NEGATIVE    KETONESUR NEGATIVE  PROTEINUR NEGATIVE  NITRITE NEGATIVE  LEUKOCYTESUR NEGATIVE     Micro Results: Recent Results (from the past 240 hour(s))  Blood Culture (routine x 2)     Status: None (Preliminary result)   Collection Time: 11/22/15  8:00 AM  Result Value Ref Range Status   Specimen Description BLOOD RIGHT HAND  Final   Special Requests BOTTLES DRAWN AEROBIC AND ANAEROBIC 5CC  Final   Culture NO GROWTH 2 DAYS  Final   Report Status PENDING  Incomplete  Blood Culture (routine x 2)     Status: None (Preliminary result)   Collection Time: 11/22/15  8:58 AM  Result Value Ref Range Status   Specimen Description BLOOD LEFT ANTECUBITAL  Final   Special Requests BOTTLES DRAWN AEROBIC AND ANAEROBIC 5CC  Final   Culture NO GROWTH 2 DAYS  Final   Report Status PENDING  Incomplete  Urine culture     Status: None   Collection Time: 11/22/15  1:30 PM  Result Value Ref Range Status   Specimen Description URINE, CLEAN CATCH  Final   Special Requests NONE  Final   Culture MULTIPLE SPECIES PRESENT, SUGGEST RECOLLECTION  Final   Report Status 11/24/2015 FINAL  Final  Respiratory virus antigens panel     Status: None   Collection Time: 11/22/15  3:32 PM  Result Value Ref Range Status   Respiratory Syncytial Virus A Negative Negative Final   Respiratory Syncytial Virus B Negative Negative Final   Influenza A Negative Negative Final   Influenza B Negative Negative Final   Parainfluenza 1 Negative Negative Final   Parainfluenza 2 Negative Negative Final   Parainfluenza 3 Negative Negative Final   Metapneumovirus Negative Negative Final   Rhinovirus Negative Negative Final   Adenovirus Negative Negative Final    Comment: (NOTE) Performed At: [facility] [address] [place], [place] [id number] Mila Homer MD [id number]   MRSA PCR Screening     Status: None   Collection Time: 11/23/15  2:04 AM  Result Value Ref Range Status   MRSA by PCR NEGATIVE NEGATIVE Final     Comment:        The GeneXpert MRSA Assay (FDA approved for NASAL specimens only), is one component of a comprehensive MRSA colonization surveillance program. It is not intended to diagnose MRSA infection nor to guide or monitor treatment for MRSA infections.    Studies/Results: Dg Chest Port 1 View  11/23/2015  CLINICAL DATA:  Shortness of breath.  History of COPD, asthma EXAM: PORTABLE CHEST 1 VIEW COMPARISON:  11/22/2015 FINDINGS: Patchy  right upper lobe opacity again noted, similar to prior study. No confluent opacity on the left. Heart is borderline in size. No effusions or acute bony abnormality. IMPRESSION: Patchy right upper lobe opacity again noted, possibly early infiltrate/pneumonia. Recommend follow-up to resolution. Electronically Signed   By: Charlett Nose M.D.   On: 11/23/2015 13:29   Medications:  . antiseptic oral rinse  7 mL Mouth Rinse q12n4p  . azelastine  2 spray Each Nare BID  . azithromycin  250 mg Intravenous Q24H  . chlorhexidine  15 mL Mouth Rinse BID  . chlorpheniramine-HYDROcodone  5 mL Oral Q12H  . dextromethorphan-guaiFENesin  1 tablet Oral BID  . diclofenac sodium  4 g Topical QID  . famotidine (PEPCID) IV  20 mg Intravenous Q24H  . fluticasone  2 spray Each Nare BID  . heparin  5,000 Units Subcutaneous 3 times per day  . ipratropium-albuterol  3 mL Nebulization Q4H  . methylPREDNISolone (SOLU-MEDROL) injection  40 mg Intravenous Q6H  . pantoprazole (PROTONIX) IV  40 mg Intravenous Q12H   acetaminophen, albuterol, ALPRAZolam, nitroGLYCERIN, ondansetron (ZOFRAN) IV  Assessment/Plan: Active Problems:   Psoriasis   Pneumonia   Sepsis (HCC)   PNA (pneumonia)   Community acquired pneumonia   Cough   Hypercapnic respiratory failure, chronic (HCC)   OSA (obstructive sleep apnea)   COPD, severe (HCC)   Immunosuppressed status (HCC)   COPD exacerbation in setting of immunocompromised status The patient is immunocompromised due to home medications of  prednisone 20mg  daily and humira 2x month. She presented with productive cough, chest pain, and worsening shortness of breath. She was found to be febrile and tachycardic. Although CXR revealed very faint patchy RUL infiltrate, a more likely diagnosis is COPD exacerbation. Repeat CXR showed stable, non-worsening infiltrate. The patient has severe COPD with PFT 02/13/2013 showing FEV1 of 49%. Her last hospitalization for COPD was in 2015 and she feels this episode is very similar. Recent sick contacts include her grand daughter who is 52 months old and often has a URI.  Updates Bipap for 4 hours last night. Episode of chest pain and SOB this morning, reproducible with palpation. EKG normal, trop negative. Likely MSK vs GERD vs anxiety. She is on H2 blocker and ppi therapy. She endorses feeling very anxious.  Treatments - Continue slow wean to her home 3L nasal cannula O2 - PCCM involved; thank you - Increase xanax to home dose of 1 mg TID  - Continue azithromycin 250 mg daily - Continue duoneb 0.5-2.5 scheduled q4h - Continue Solumderol 40 mg scheduled q6h - Continue Albuterol to 2 puffs q4h PRN - Continue Mucinex BID - Continue Flonase daily (home med) - Continue Claritin 10 mg daily (home med) - Tylenol 650 mg q6h prn Diagnostics - F/u sputum gram stain and culture (not yet collected) - Strep pneumo negative - Respiratory viral panel negative - Flu negative - UA clean - Blood cultures NGTD  Anxiety The patient continues to be very anxious. Takes xanax 1 mg TID at home. Has a history of intentional overdose with alprazolam in 2014. Was originally started on xanax 0.5 mg TID here. - Increase xanax to home dose of 1 mg TID - Continue home trazodone 50 mg nightly   GERD - Continue Pantoprazole 40 mg IV BID per PCCM - Continue famotidine 20 mg IV daily per PCCM - Theophylline level pending (r/o chronic theophilline toxicity from home med)  VTE prophylaxis Heparin 5000 units  TID  This is a Psychologist, occupational Note.  The care of the patient was discussed with Dr. Danella Penton and the assessment and plan formulated with their assistance.  Please see their attached note for official documentation of the daily encounter.   LOS: 3 days   Rosebud Poles, Med Student 11/25/2015, 11:50 AM

## 2015-11-25 NOTE — Progress Notes (Signed)
PULMONARY / CRITICAL CARE MEDICINE   Name: Wendy [name] MRN: [id number] DOB: Jun 23, 1961    ADMISSION DATE:  11/22/2015 CONSULTATION DATE: 3/9  REFERRING MD: Daiva Eves   CHIEF COMPLAINT:  SOB - Difficulty to wean off BIPAP  SUBJECTIVE:  Breathing is stable.  Her main concern is reflux.  VITAL SIGNS: BP 147/79 mmHg  Pulse 84  Temp(Src) 98.3 F (36.8 C) (Oral)  Resp 17  Ht 5\' 1"  (1.549 m)  Wt 152 lb (68.947 kg)  BMI 28.74 kg/m2  SpO2 98%  INTAKE / OUTPUT: I/O last 3 completed shifts: In: 215 [Other:40; IV Piggyback:175] Out: 525 [Urine:525]  PHYSICAL EXAMINATION: General: alert Neuro: follows commands Cardiac: regular, no murmur Chest: poor air movement, no wheeze Abd: soft, non tender Ext: no edema Skin: no rashes  LABS:  BMET  Recent Labs Lab 11/22/15 0800 11/23/15 0330 11/24/15 0943  NA 139 137 136  K 3.2* 4.7 4.4  CL 92* 94* 90*  CO2 36* 34* 35*  BUN 6 7 13   CREATININE 0.86 0.62 0.78  GLUCOSE 109* 132* 153*    Electrolytes  Recent Labs Lab 11/22/15 0800 11/22/15 0935 11/23/15 0330 11/23/15 0852 11/24/15 0943  CALCIUM 9.0  --  8.4*  --  8.7*  MG  --  1.3*  --  2.1  --     CBC  Recent Labs Lab 11/22/15 0800 11/23/15 0330 11/24/15 0943  WBC 9.5 8.4 7.2  HGB 11.4* 10.7* 11.2*  HCT 36.5 36.4 36.2  PLT 216 221 248    Coag's  Recent Labs Lab 11/22/15 0800  INR 0.93    Sepsis Markers  Recent Labs Lab 11/22/15 0811 11/22/15 1140 11/22/15 1707 11/24/15 0520  LATICACIDVEN 1.50 0.33*  --   --   PROCALCITON  --   --  <0.10 <0.10    ABG  Recent Labs Lab 11/23/15 1211 11/23/15 1458 11/23/15 2218  PHART 7.219* 7.260* 7.327*  PCO2ART 95.4* 88.5* 73.9*  PO2ART 73.1* 80.8 87.5    Liver Enzymes  Recent Labs Lab 11/22/15 0800  AST 20  ALT 14  ALKPHOS 40  BILITOT 0.2*  ALBUMIN 3.7    Cardiac Enzymes  Recent Labs Lab 11/22/15 1654  TROPONINI 0.03    Glucose No results for input(s): GLUCAP in the last  168 hours.  Imaging No results found.   STUDIES:  PFT 02/13/13 >> FEV1 0.48 (19%), FEV1% 39, DLCO 46%  CULTURES: 3/7 Blood x 2 >> 3/7 Urine  >> negative 3/7 RVP >> negative  ANTIBIOTICS: Azithromycin 3/7 >>> Azactam 3/7 >>> 3/7 Vancomycin 3/7 >> 3/7  DISCUSSION: 56 yo female former smoker presented with cough, dyspnea, chest pain, fever (Tm 104F) 2nd to pneumonia.  She is followed by Dr. 5/7 for GOLD D COPD on chronic prednisone, chronic hypoxic/hypercapnic respiratory failure.    She has hx of psoriasis on humira, chronic diastolic CHF, depression.  ASSESSMENT / PLAN:  Acute on chronic hypoxic/hypercapnic respiratory failure 2nd to pneumonia. Plan: - Abx per primary team - f/u CXR intermittently - oxygen to keep SpO2 88 to 95% - BiPAP prn  GOLD D COPD. Plan: - scheduled BDs - continue solumedrol  Rhinitis. Plan: - astelin, flonase  Cough. Plan: - tussionex, mucinex  GERD. Plan: - per primary team  Goals of care >> she is not interested in speaking with palliative care at this time.  She says her family has been informed of her wishes >> should would be okay with intubation if needed, and leave it  to her family to decide how long she should stay on the vent.   Coralyn Helling, MD [facility] Pulmonary/Critical Care 11/25/2015, 11:39 AM Pager:  [phone] After 3pm call: [phone]

## 2015-11-26 DIAGNOSIS — Z7189 Other specified counseling: Secondary | ICD-10-CM | POA: Insufficient documentation

## 2015-11-26 DIAGNOSIS — J9612 Chronic respiratory failure with hypercapnia: Secondary | ICD-10-CM

## 2015-11-26 LAB — PROCALCITONIN: Procalcitonin: <0.1 ng/mL

## 2015-11-26 MED ORDER — METHYLPREDNISOLONE SODIUM SUCC 40 MG IJ SOLR
40.0000 mg | Freq: Two times a day (BID) | INTRAMUSCULAR | Status: DC
  Administered 2015-11-26 – 2015-11-27 (×2): 40 mg via INTRAVENOUS
  Filled 2015-11-26 (×2): qty 1

## 2015-11-26 MED ORDER — IPRATROPIUM-ALBUTEROL 0.5-2.5 (3) MG/3ML IN SOLN
3.0000 mL | Freq: Four times a day (QID) | RESPIRATORY_TRACT | Status: DC
  Administered 2015-11-26 – 2015-11-28 (×10): 3 mL via RESPIRATORY_TRACT
  Filled 2015-11-26 (×12): qty 3

## 2015-11-26 NOTE — Progress Notes (Signed)
PULMONARY / CRITICAL CARE MEDICINE   Name: Wendy [name] MRN: [id number] DOB: 08/17/1961    ADMISSION DATE:  11/22/2015 CONSULTATION DATE: 3/9  REFERRING MD: Daiva Eves   CHIEF COMPLAINT:  SOB - Difficulty to wean off BIPAP  SUBJECTIVE:  Breathing is stable/ a little better today and since admission. Daughter in room.  VITAL SIGNS: BP 150/76 mmHg  Pulse 90  Temp(Src) 97.9 F (36.6 C) (Oral)  Resp 16  Ht 5\' 1"  (1.549 m)  Wt 68.947 kg (152 lb)  BMI 28.74 kg/m2  SpO2 100%  INTAKE / OUTPUT: I/O last 3 completed shifts: In: 480 [P.O.:480] Out: 650 [Urine:650]  PHYSICAL EXAMINATION: General: alert, appropriate Neuro: follows commands Cardiac: regular, no murmur Chest: very poor air movement, no wheeze. Unlabored at rest Abd: soft, non tender Ext: no edema Skin: no rashes  LABS:  BMET  Recent Labs Lab 11/22/15 0800 11/23/15 0330 11/24/15 0943  NA 139 137 136  K 3.2* 4.7 4.4  CL 92* 94* 90*  CO2 36* 34* 35*  BUN 6 7 13   CREATININE 0.86 0.62 0.78  GLUCOSE 109* 132* 153*    Electrolytes  Recent Labs Lab 11/22/15 0800 11/22/15 0935 11/23/15 0330 11/23/15 0852 11/24/15 0943  CALCIUM 9.0  --  8.4*  --  8.7*  MG  --  1.3*  --  2.1  --     CBC  Recent Labs Lab 11/22/15 0800 11/23/15 0330 11/24/15 0943  WBC 9.5 8.4 7.2  HGB 11.4* 10.7* 11.2*  HCT 36.5 36.4 36.2  PLT 216 221 248    Coag's  Recent Labs Lab 11/22/15 0800  INR 0.93    Sepsis Markers  Recent Labs Lab 11/22/15 0811 11/22/15 1140 11/22/15 1707 11/24/15 0520 11/26/15 0435  LATICACIDVEN 1.50 0.33*  --   --   --   PROCALCITON  --   --  <0.10 <0.10 <0.10    ABG  Recent Labs Lab 11/23/15 1211 11/23/15 1458 11/23/15 2218  PHART 7.219* 7.260* 7.327*  PCO2ART 95.4* 88.5* 73.9*  PO2ART 73.1* 80.8 87.5    Liver Enzymes  Recent Labs Lab 11/22/15 0800  AST 20  ALT 14  ALKPHOS 40  BILITOT 0.2*  ALBUMIN 3.7    Cardiac Enzymes  Recent Labs Lab 11/22/15 1654  11/25/15 1130  TROPONINI 0.03 <0.03    Glucose No results for input(s): GLUCAP in the last 168 hours.  Imaging No results found.  CXR- faint RUL infilt not present on prior  CT. I reviewed images  STUDIES:  PFT 02/13/13 >> FEV1 0.48 (19%), FEV1% 39, DLCO 46%  CULTURES: 3/7 Blood x 2 >> 3/7 Urine  >> negative 3/7 RVP >> negative  ANTIBIOTICS: Azithromycin 3/7 >>> Azactam 3/7 >>> 3/7 Vancomycin 3/7 >> 3/7  DISCUSSION: 55 yo female former smoker presented with cough, dyspnea, chest pain, fever (Tm 104F) 2nd to pneumonia.  She is followed by Dr. 5/7 for GOLD D COPD on chronic prednisone, chronic hypoxic/hypercapnic respiratory failure.    She has hx of psoriasis on humira, chronic diastolic CHF, depression.  ASSESSMENT / PLAN:  Acute on chronic hypoxic/hypercapnic respiratory failure 2nd to pneumonia. Plan: - Abx per primary team - f/u CXR intermittently - oxygen to keep SpO2 88 to 95% - BiPAP prn - mobilize as able- sit edge of bed, up in chair  GOLD D COPD. Plan: - scheduled BDs - continue solumedrol  Rhinitis. Plan: - astelin, flonase  Cough. Plan: - tussionex, mucinex  GERD. Plan: - per primary team  Goals of care >> 3/10-she is not interested in speaking with palliative care at this time.  She says her family has been informed of her wishes >> should would be okay with intubation if needed, and leave it to her family to decide how long she should stay on the vent. 3/11- I talked with her again today about Palliative Care as a way to help her with conversation so that we can help her follow her wishes. I made some distinction between Palliative Care and a Hospice/ EOL decision. I think she would be receptive if you request Palliative Care consult.   CD Maple Hudson, MD [facility] Pulmonary/Critical Care 11/26/2015, 9:47 AM Pager:  [phone] After 3pm call: [phone]

## 2015-11-26 NOTE — Progress Notes (Signed)
RT called by RN due to patient desat.  ATF SpO2 86-88%.  Patient placed on 40% VM with increase in SpO2 to 92-94%.  Patient drowsy (post xanax administration), but able to answer orientation questions appropriately. Patient states that she awoke panicked and became short of breath.  Patient denies breathing trouble at time of encounter.  Mild accessory muscle use noted.  Patient placed back in bed and resting.  RT will continue to monitor.

## 2015-11-26 NOTE — Progress Notes (Addendum)
Subjective: Pt required BIPAP earlier this morning. On exam today she was breathing comfortably on her home 3L, it was increased to 3.5L to keep O2 sat above 88%. She was drowsiness but easily awakened and answering questions. No complaints this am.   Objective: Vital signs in last 24 hours: Filed Vitals:   11/26/15 0620 11/26/15 0820 11/26/15 0829 11/26/15 0851  BP:    150/76  Pulse: 94 87 90   Temp:   97.9 F (36.6 C)   TempSrc:   Oral   Resp: 17 19 16    Height:      Weight:      SpO2: 90% 99% 100%    Weight change:   Intake/Output Summary (Last 24 hours) at 11/26/15 0943 Last data filed at 11/26/15 0817  Gross per 24 hour  Intake    610 ml  Output    150 ml  Net    460 ml   General: NAD, obese, drowsy Lungs: faint wheezing, improved air movement.  Cardiac: RRR, no murmurs GI: soft, active bowel sounds Neuro: CN II-XII grossly intact Skin: warm and dry Ext: trace pedal edema  Lab Results: Basic Metabolic Panel:  Recent Labs Lab 11/22/15 0935 11/23/15 0330 11/23/15 0852 11/24/15 0943  NA  --  137  --  136  K  --  4.7  --  4.4  CL  --  94*  --  90*  CO2  --  34*  --  35*  GLUCOSE  --  132*  --  153*  BUN  --  7  --  13  CREATININE  --  0.62  --  0.78  CALCIUM  --  8.4*  --  8.7*  MG 1.3*  --  2.1  --    Liver Function Tests:  Recent Labs Lab 11/22/15 0800  AST 20  ALT 14  ALKPHOS 40  BILITOT 0.2*  PROT 6.3*  ALBUMIN 3.7   CBC:  Recent Labs Lab 11/23/15 0330 11/24/15 0943  WBC 8.4 7.2  HGB 10.7* 11.2*  HCT 36.4 36.2  MCV 95.8 95.3  PLT 221 248   Cardiac Enzymes:  Recent Labs Lab 11/22/15 1654 11/25/15 1130  TROPONINI 0.03 <0.03      Micro Results: Recent Results (from the past 240 hour(s))  Blood Culture (routine x 2)     Status: None (Preliminary result)   Collection Time: 11/22/15  8:00 AM  Result Value Ref Range Status   Specimen Description BLOOD RIGHT HAND  Final   Special Requests BOTTLES DRAWN AEROBIC AND  ANAEROBIC 5CC  Final   Culture NO GROWTH 3 DAYS  Final   Report Status PENDING  Incomplete  Blood Culture (routine x 2)     Status: None (Preliminary result)   Collection Time: 11/22/15  8:58 AM  Result Value Ref Range Status   Specimen Description BLOOD LEFT ANTECUBITAL  Final   Special Requests BOTTLES DRAWN AEROBIC AND ANAEROBIC 5CC  Final   Culture NO GROWTH 3 DAYS  Final   Report Status PENDING  Incomplete  Urine culture     Status: None   Collection Time: 11/22/15  1:30 PM  Result Value Ref Range Status   Specimen Description URINE, CLEAN CATCH  Final   Special Requests NONE  Final   Culture MULTIPLE SPECIES PRESENT, SUGGEST RECOLLECTION  Final   Report Status 11/24/2015 FINAL  Final  Respiratory virus antigens panel     Status: None   Collection Time: 11/22/15  3:32  PM  Result Value Ref Range Status   Respiratory Syncytial Virus A Negative Negative Final   Respiratory Syncytial Virus B Negative Negative Final   Influenza A Negative Negative Final   Influenza B Negative Negative Final   Parainfluenza 1 Negative Negative Final   Parainfluenza 2 Negative Negative Final   Parainfluenza 3 Negative Negative Final   Metapneumovirus Negative Negative Final   Rhinovirus Negative Negative Final   Adenovirus Negative Negative Final    Comment: (NOTE) Performed At: [facility] [address] [place], [place] [id number] Mila Homer MD [id number]   MRSA PCR Screening     Status: None   Collection Time: 11/23/15  2:04 AM  Result Value Ref Range Status   MRSA by PCR NEGATIVE NEGATIVE Final    Comment:        The GeneXpert MRSA Assay (FDA approved for NASAL specimens only), is one component of a comprehensive MRSA colonization surveillance program. It is not intended to diagnose MRSA infection nor to guide or monitor treatment for MRSA infections.   Culture, sputum-assessment     Status: None   Collection Time: 11/25/15  9:58 PM  Result Value Ref Range  Status   Specimen Description SPUTUM  Final   Special Requests NONE  Final   Sputum evaluation   Final    THIS SPECIMEN IS ACCEPTABLE. RESPIRATORY CULTURE REPORT TO FOLLOW.   Report Status 11/25/2015 FINAL  Final   Medications: I have reviewed the patient's current medications. Scheduled Meds: . antiseptic oral rinse  7 mL Mouth Rinse q12n4p  . azelastine  2 spray Each Nare BID  . azithromycin  250 mg Intravenous Q24H  . chlorhexidine  15 mL Mouth Rinse BID  . chlorpheniramine-HYDROcodone  5 mL Oral Q12H  . dextromethorphan-guaiFENesin  1 tablet Oral BID  . diclofenac sodium  4 g Topical QID  . famotidine (PEPCID) IV  20 mg Intravenous Q24H  . fluticasone  2 spray Each Nare BID  . heparin  5,000 Units Subcutaneous 3 times per day  . ipratropium-albuterol  3 mL Nebulization QID  . methylPREDNISolone (SOLU-MEDROL) injection  40 mg Intravenous Q6H  . pantoprazole (PROTONIX) IV  40 mg Intravenous Q12H   Continuous Infusions:  PRN Meds:.acetaminophen, albuterol, ALPRAZolam, nitroGLYCERIN, ondansetron (ZOFRAN) IV Assessment/Plan: Active Problems:   Psoriasis   Pneumonia   Sepsis (HCC)   PNA (pneumonia)   Community acquired pneumonia   Cough   Hypercapnic respiratory failure, chronic (HCC)   OSA (obstructive sleep apnea)   COPD, severe (HCC)   Immunosuppressed status (HCC)   Acute COPD exacerbation-- Pt requiring intermittent BIPAP - continue IV azithromycin   - duonebs q4h, albuterol inhaler q2h prn, and albuterol neb q4h prn - solumedrol 40mg  q6h decreased to BID.  - follow up sputum culture and gram stain - continue home mucinex DM - oxygen therapy to maintain O2 sats between 88-92% - PCCM following, appreciate their recommendations.  - continue PPI   Anxiety-- hx of intentional benzo overdose in 2014 that did not require intubation.  - conintue xanax 1mg  TID at home.   FEN - HH diet  Code: FULL code, she states she would not want to be intubated if she would have  to stay on the ventilator for a prolonged period of time.   Dispo: Disposition is deferred at this time, awaiting improvement of current medical problems. The patient does have a current PCP (2015, MD) and does not need an [facility] hospital follow-up appointment after discharge.   Dessa Phi  Services Needed at time of discharge: Y = Yes, Blank = No PT:   OT:   RN:   Equipment:   Other:     LOS: 4 days   Denton Brick, MD 11/26/2015, 9:43 AM    Date: 11/26/2015  Patient name: Wendy [name] [facility]  Medical record number: [id number]  Date of birth: [birth year]   This patient's plan of care was discussed with the house staff. Please see their note for complete details. I concur with their findings.  Patient more comfortable this am off of BIPAP. Greatly appreciate Dr. Roxy Cedar help with this patient.  She is now sp 5 days of abx so ok to dc her azithromycin but will continue systemic steroids, BiPAP when needed and she is on meds for GERD.    Randall Hiss, MD 11/26/2015, 2:05 PM

## 2015-11-26 NOTE — Progress Notes (Signed)
Subjective: On my interview with the patient, she reports that she had a coughing fit this AM. She then began to feel anxious and tense up. She had a desaturation, notes report down to 60s. She was on VM briefly. Now, she is sitting in bed comfortably on 4.5L nasal cannula. She is eating grits. She has no complaints.  Objective: Vital signs in last 24 hours: Filed Vitals:   11/26/15 0620 11/26/15 0820 11/26/15 0829 11/26/15 0851  BP:    150/76  Pulse: 94 87 90   Temp:   97.9 F (36.6 C)   TempSrc:   Oral   Resp: 17 19 16    Height:      Weight:      SpO2: 90% 99% 100%    Weight change:   Intake/Output Summary (Last 24 hours) at 11/26/15 0939 Last data filed at 11/26/15 0817  Gross per 24 hour  Intake    610 ml  Output    150 ml  Net    460 ml    Physical Exam General Appearance:    Alert, cooperative, no distress  Head:    Normocephalic, without obvious abnormality, atraumatic  Eyes:    conjunctiva/corneas clear, EOM's intact  Nose:   Nares normal,no rhinorrhe. Nasal cannula oxygen at 4.5 L.  Lungs:     Clear to auscultation bilaterally, respirations unlabored  Chest Wall:    No tenderness or deformity   Heart:    Regular rate and rhythm, S1 and S2 normal, no murmur, rub   or gallop  Abdomen:     Soft, distended. No masses, no organomegaly  Extremities:   Extremities normal, atraumatic, no cyanosis or edema  Skin:   Skin color, texture, turgor normal, no rashes or lesions  Neurologic:   Alert and oriented   Lab Results: Basic Metabolic Panel:  Recent Labs  42/87/68 0943  NA 136  K 4.4  CL 90*  CO2 35*  GLUCOSE 153*  BUN 13  CREATININE 0.78  CALCIUM 8.7*   Liver Function Tests: No results for input(s): AST, ALT, ALKPHOS, BILITOT, PROT, ALBUMIN in the last 72 hours. No results for input(s): LIPASE, AMYLASE in the last 72 hours. No results for input(s): AMMONIA in the last 72 hours. CBC:  Recent Labs  11/24/15 0943  WBC 7.2  HGB 11.2*  HCT 36.2  MCV  95.3  PLT 248   Cardiac Enzymes:  Recent Labs  11/25/15 1130  TROPONINI <0.03   BNP: No results for input(s): PROBNP in the last 72 hours. D-Dimer: No results for input(s): DDIMER in the last 72 hours. CBG: No results for input(s): GLUCAP in the last 72 hours. Hemoglobin A1C: No results for input(s): HGBA1C in the last 72 hours. Fasting Lipid Panel: No results for input(s): CHOL, HDL, LDLCALC, TRIG, CHOLHDL, LDLDIRECT in the last 72 hours. Thyroid Function Tests: No results for input(s): TSH, T4TOTAL, FREET4, T3FREE, THYROIDAB in the last 72 hours. Anemia Panel: No results for input(s): VITAMINB12, FOLATE, FERRITIN, TIBC, IRON, RETICCTPCT in the last 72 hours. Coagulation: No results for input(s): LABPROT, INR in the last 72 hours. Urine Drug Screen: Drugs of Abuse     Component Value Date/Time   LABOPIA NONE DETECTED 11/27/2012 0017   COCAINSCRNUR NONE DETECTED 11/27/2012 0017   LABBENZ POSITIVE* 11/27/2012 0017   AMPHETMU NONE DETECTED 11/27/2012 0017   THCU NONE DETECTED 11/27/2012 0017   LABBARB NONE DETECTED 11/27/2012 0017    Alcohol Level: No results for input(s): ETH in the last  72 hours. Urinalysis: No results for input(s): COLORURINE, LABSPEC, PHURINE, GLUCOSEU, HGBUR, BILIRUBINUR, KETONESUR, PROTEINUR, UROBILINOGEN, NITRITE, LEUKOCYTESUR in the last 72 hours.  Invalid input(s): APPERANCEUR   Micro Results: Recent Results (from the past 240 hour(s))  Blood Culture (routine x 2)     Status: None (Preliminary result)   Collection Time: 11/22/15  8:00 AM  Result Value Ref Range Status   Specimen Description BLOOD RIGHT HAND  Final   Special Requests BOTTLES DRAWN AEROBIC AND ANAEROBIC 5CC  Final   Culture NO GROWTH 3 DAYS  Final   Report Status PENDING  Incomplete  Blood Culture (routine x 2)     Status: None (Preliminary result)   Collection Time: 11/22/15  8:58 AM  Result Value Ref Range Status   Specimen Description BLOOD LEFT ANTECUBITAL  Final    Special Requests BOTTLES DRAWN AEROBIC AND ANAEROBIC 5CC  Final   Culture NO GROWTH 3 DAYS  Final   Report Status PENDING  Incomplete  Urine culture     Status: None   Collection Time: 11/22/15  1:30 PM  Result Value Ref Range Status   Specimen Description URINE, CLEAN CATCH  Final   Special Requests NONE  Final   Culture MULTIPLE SPECIES PRESENT, SUGGEST RECOLLECTION  Final   Report Status 11/24/2015 FINAL  Final  Respiratory virus antigens panel     Status: None   Collection Time: 11/22/15  3:32 PM  Result Value Ref Range Status   Respiratory Syncytial Virus A Negative Negative Final   Respiratory Syncytial Virus B Negative Negative Final   Influenza A Negative Negative Final   Influenza B Negative Negative Final   Parainfluenza 1 Negative Negative Final   Parainfluenza 2 Negative Negative Final   Parainfluenza 3 Negative Negative Final   Metapneumovirus Negative Negative Final   Rhinovirus Negative Negative Final   Adenovirus Negative Negative Final    Comment: (NOTE) Performed At: [facility] [address] [place], [place] [id number] Mila Homer MD [id number]   MRSA PCR Screening     Status: None   Collection Time: 11/23/15  2:04 AM  Result Value Ref Range Status   MRSA by PCR NEGATIVE NEGATIVE Final    Comment:        The GeneXpert MRSA Assay (FDA approved for NASAL specimens only), is one component of a comprehensive MRSA colonization surveillance program. It is not intended to diagnose MRSA infection nor to guide or monitor treatment for MRSA infections.   Culture, sputum-assessment     Status: None   Collection Time: 11/25/15  9:58 PM  Result Value Ref Range Status   Specimen Description SPUTUM  Final   Special Requests NONE  Final   Sputum evaluation   Final    THIS SPECIMEN IS ACCEPTABLE. RESPIRATORY CULTURE REPORT TO FOLLOW.   Report Status 11/25/2015 FINAL  Final   Studies/Results: No results found. Medications:  . antiseptic oral  rinse  7 mL Mouth Rinse q12n4p  . azelastine  2 spray Each Nare BID  . azithromycin  250 mg Intravenous Q24H  . chlorhexidine  15 mL Mouth Rinse BID  . chlorpheniramine-HYDROcodone  5 mL Oral Q12H  . dextromethorphan-guaiFENesin  1 tablet Oral BID  . diclofenac sodium  4 g Topical QID  . famotidine (PEPCID) IV  20 mg Intravenous Q24H  . fluticasone  2 spray Each Nare BID  . heparin  5,000 Units Subcutaneous 3 times per day  . ipratropium-albuterol  3 mL Nebulization QID  . methylPREDNISolone (  SOLU-MEDROL) injection  40 mg Intravenous Q6H  . pantoprazole (PROTONIX) IV  40 mg Intravenous Q12H   acetaminophen, albuterol, ALPRAZolam, nitroGLYCERIN, ondansetron (ZOFRAN) IV  Assessment/Plan: Active Problems:   Psoriasis   Pneumonia   Sepsis (HCC)   PNA (pneumonia)   Community acquired pneumonia   Cough   Hypercapnic respiratory failure, chronic (HCC)   OSA (obstructive sleep apnea)   COPD, severe (HCC)   Immunosuppressed status (HCC)  COPD exacerbation in setting of immunocompromised status The patient is immunocompromised due to home medications of prednisone 20mg  daily and humira 2x month. She presented with productive cough, chest pain, and worsening shortness of breath. She was found to be febrile and tachycardic. Although CXR revealed very faint patchy RUL infiltrate, a more likely diagnosis is COPD exacerbation. Repeat CXR showed stable, non-worsening infiltrate. The patient has severe COPD with PFT 02/13/2013 showing FEV1 of 49%. Her last hospitalization for COPD was in 2015 and she feels this episode is very similar. Recent sick contacts include her grand daughter who is 5 months old and often has a URI.  Updates Coughing fit this AM with desat. Otherwise feeling comfortable on 4.5 L [place], eating. Treatments - Continue slow wean to her home 3L nasal cannula O2 - PCCM involved; thank you - Continue xanax at home dose of 1 mg TID  - Continue azithromycin 250 mg daily - Decrease  duoneb 4xday - Decrease Solumderol to 40 BID - Continue Mucinex, flonase, tylenol - Pt had episode of chest pain reproducible with palpation yesterday; continue voltaren gel 4xday Diagnostics - F/u sputum gram stain and culture (sent 11/26/15) - Strep pneumo negative - Respiratory viral panel negative - Flu negative - UA clean - Blood cultures NGTD  Anxiety Takes xanax 1 mg TID at home. Has a history of intentional overdose with alprazolam in 2014. Was originally started on xanax 0.5 mg TID here in the hospital. She became progressively more anxious and endorsed having episodes where she panicked and held her breath, thus causing her to desat. Yesterday 11/25/15 we increased her xanax to her home dose of 1 mg TID. The patient feels better today and is glad for the increased dose - Continue home xanax 1 mg TID - Home trazodone 50 mg nightly DC'd overnight; consider re-adding if pt requests  GERD - Continue Pantoprazole 40 mg IV BID - Consider DC famotidine 20 mg IV daily  VTE prophylaxis Heparin 5000 units TID   This is a 01/25/16 Note.  The care of the patient was discussed with Dr. Psychologist, occupational and the assessment and plan formulated with their assistance.  Please see their attached note for official documentation of the daily encounter.   LOS: 4 days   Danella Penton, Med Student 11/26/2015, 9:39 AM

## 2015-11-26 NOTE — Progress Notes (Signed)
Received call from MD requesting that pt continue to have o2 weaned so that pt sats were between 88-92%. Pt had been 98-100% on 4-5l [place] throughout the majority of the night. This RN turned pt's o2 down to 3L [place] immediately after MD's phone call around 0600. Within 10 minutes pt sats dropped. This RN entered room to find pt seated at the side of the bed with another RN at bedside, pt with 35% Veni Mask in place and sats in the 60s%. Pt was in obvious distress. Veni Mask increased to 40% and sats gradually improved. Pt verbalized that she woke up short of breathe and pt is unclear if she sat herself to the side of the bed or if the nurse was present at that time. RT called to bedside to evaluate pt condition. Sats improved to mid 90s on a 40% Veni Mask and pt less labored with breathing. Pt assisted back into bed. Will continue to monitor pt.  Today's Vitals   11/26/15 0605 11/26/15 0610 11/26/15 0613 11/26/15 0620  BP:      Pulse: 105 113 118 94  Temp:      TempSrc:      Resp: 19 24 17 17   Height:      Weight:      SpO2: 90% 53% 79% 90%  PainSc:

## 2015-11-27 DIAGNOSIS — F4323 Adjustment disorder with mixed anxiety and depressed mood: Secondary | ICD-10-CM

## 2015-11-27 DIAGNOSIS — Z515 Encounter for palliative care: Secondary | ICD-10-CM | POA: Insufficient documentation

## 2015-11-27 DIAGNOSIS — J9620 Acute and chronic respiratory failure, unspecified whether with hypoxia or hypercapnia: Secondary | ICD-10-CM

## 2015-11-27 LAB — CULTURE, BLOOD (ROUTINE X 2)
CULTURE: NO GROWTH
Culture: NO GROWTH

## 2015-11-27 MED ORDER — METHYLPREDNISOLONE SODIUM SUCC 40 MG IJ SOLR
40.0000 mg | Freq: Once | INTRAMUSCULAR | Status: AC
  Administered 2015-11-27: 40 mg via INTRAVENOUS
  Filled 2015-11-27: qty 1

## 2015-11-27 MED ORDER — PANTOPRAZOLE SODIUM 40 MG PO TBEC
40.0000 mg | DELAYED_RELEASE_TABLET | Freq: Two times a day (BID) | ORAL | Status: DC
  Administered 2015-11-27 – 2015-11-30 (×7): 40 mg via ORAL
  Filled 2015-11-27 (×7): qty 1

## 2015-11-27 MED ORDER — PREDNISONE 50 MG PO TABS
50.0000 mg | ORAL_TABLET | Freq: Every day | ORAL | Status: DC
  Administered 2015-11-28 – 2015-11-30 (×3): 50 mg via ORAL
  Filled 2015-11-27 (×3): qty 1

## 2015-11-27 NOTE — Progress Notes (Signed)
Per MD sats to maintain between 88-92%. Patient had been increased to 3.5L earlier in evening to compensate for pt increased SOB and decreasing sats with activity. When patient sleeping sats maintain >92% even when oxygen lowered to 1.5L [place]. When patient awake sats more in range and  with any activity sats decrease into the upper 80's.

## 2015-11-27 NOTE — Assessment & Plan Note (Signed)
Oxygen dependent with severe baseline emphysema and probably viral respiratory exacerbation. If she is a [facility] patient, then she might be eligible for their Pre-Palliative Care status, which can provide some supportive services on a long term basis.

## 2015-11-27 NOTE — Progress Notes (Signed)
PT Cancellation Note  Patient Details Name: Wendy [name] MRN: [id number] DOB: 21-May-1961   Cancelled Treatment:    Reason Eval/Treat Not Completed: Other (comment). Attempted x 2. First attempt pt getting ready for transfer to different unit. Currently pt sitting EOB but feels she is at her limit and not able to attempt ambulation yet. Will re-attempt tomorrow.   Anselma Herbel 11/27/2015, 3:58 PM [facility] PT [phone]

## 2015-11-27 NOTE — Progress Notes (Signed)
PULMONARY / CRITICAL CARE MEDICINE   Name: Wendy [name] MRN: [id number] DOB: Feb 11, 1961    ADMISSION DATE:  11/22/2015 CONSULTATION DATE: 3/9  REFERRING MD: Daiva Eves   CHIEF COMPLAINT:  SOB - Difficulty to wean off BIPAP  SUBJECTIVE:  Daughter and Resident here. Pt very anxious, unsure about ability to progress due to marked DOE. Some tussive midsternal soreness.  VITAL SIGNS: BP 170/94 mmHg  Pulse 88  Temp(Src) 98.8 F (37.1 C) (Oral)  Resp 16  Ht 5\' 1"  (1.549 m)  Wt 68.947 kg (152 lb)  BMI 28.74 kg/m2  SpO2 91%  INTAKE / OUTPUT: I/O last 3 completed shifts: In: 660 [P.O.:360; IV Piggyback:300] Out: 800 [Urine:800]  PHYSICAL EXAMINATION: General: alert, appropriate Neuro: follows commands Cardiac: regular, no murmur Chest: very poor air movement, no wheeze. Unlabored at rest, pursed lips Abd: soft, non tender Ext: no edema Skin: no rashes  LABS:  BMET  Recent Labs Lab 11/22/15 0800 11/23/15 0330 11/24/15 0943  NA 139 137 136  K 3.2* 4.7 4.4  CL 92* 94* 90*  CO2 36* 34* 35*  BUN 6 7 13   CREATININE 0.86 0.62 0.78  GLUCOSE 109* 132* 153*    Electrolytes  Recent Labs Lab 11/22/15 0800 11/22/15 0935 11/23/15 0330 11/23/15 0852 11/24/15 0943  CALCIUM 9.0  --  8.4*  --  8.7*  MG  --  1.3*  --  2.1  --     CBC  Recent Labs Lab 11/22/15 0800 11/23/15 0330 11/24/15 0943  WBC 9.5 8.4 7.2  HGB 11.4* 10.7* 11.2*  HCT 36.5 36.4 36.2  PLT 216 221 248    Coag's  Recent Labs Lab 11/22/15 0800  INR 0.93    Sepsis Markers  Recent Labs Lab 11/22/15 0811 11/22/15 1140 11/22/15 1707 11/24/15 0520 11/26/15 0435  LATICACIDVEN 1.50 0.33*  --   --   --   PROCALCITON  --   --  <0.10 <0.10 <0.10    ABG  Recent Labs Lab 11/23/15 1211 11/23/15 1458 11/23/15 2218  PHART 7.219* 7.260* 7.327*  PCO2ART 95.4* 88.5* 73.9*  PO2ART 73.1* 80.8 87.5    Liver Enzymes  Recent Labs Lab 11/22/15 0800  AST 20  ALT 14  ALKPHOS 40   BILITOT 0.2*  ALBUMIN 3.7    Cardiac Enzymes  Recent Labs Lab 11/22/15 1654 11/25/15 1130  TROPONINI 0.03 <0.03    Glucose No results for input(s): GLUCAP in the last 168 hours.  Imaging No results found.  CXR- faint RUL infilt not present on prior  CT. I reviewed images  STUDIES:  PFT 02/13/13 >> FEV1 0.48 (19%), FEV1% 39, DLCO 46%  CULTURES: 3/7 Blood x 2 >> 3/7 Urine  >> negative 3/7 RVP >> negative  ANTIBIOTICS: Azithromycin 3/7 >>> Azactam 3/7 >>> 3/7 Vancomycin 3/7 >> 3/7  DISCUSSION: 55 yo female former smoker presented with cough, dyspnea, chest pain, fever (Tm 104F) 2nd to pneumonia.  Followed by Dr. 5/7 for GOLD D COPD on chronic prednisone, chronic hypoxic/hypercapnic respiratory failure.    She has hx of psoriasis on humira, chronic diastolic CHF, depression.  ASSESSMENT / PLAN:  Acute on chronic hypoxic/hypercapnic respiratory failure 2nd to pneumonia. Plan: - Abx per primary team - f/u CXR intermittently - oxygen to keep SpO2 88 to 95% - BiPAP prn - mobilize as able- sit edge of bed, up in chair. Agree with plans to progress care> telemetry. -Take advantage of access to Palliative Care consultation while she is here and willing  to talk. - Wean steroids as able to reduce catabolic and myopathic effects. She will need to go home on prednisone.  GOLD D COPD. Plan: - scheduled BDs - continue solumedrol  Rhinitis. Plan: - astelin, flonase  Cough. Plan: - tussionex, mucinex  GERD. Plan: - per primary team  Goals of care >>   I talked with her again today about Palliative Care as a way to help her with conversation so that we can help her follow her wishes. I made some distinction between Palliative Care and a Hospice/ EOL decision. I think she would be receptive if you request Palliative Care consult.   CD Maple Hudson, MD [facility] Pulmonary/Critical Care 11/27/2015, 9:22 AM Pager:  [phone] After 3pm call: [phone]

## 2015-11-27 NOTE — Progress Notes (Addendum)
Subjective: Pt states her breathing is the same. She did not require BIPAP overnight or yesterday. Sating 94% on 2L O2.   Objective: Vital signs in last 24 hours: Filed Vitals:   11/27/15 0446 11/27/15 0810 11/27/15 0811 11/27/15 0932  BP: 167/83 170/94 170/94   Pulse: 80 95 88   Temp: 98.3 F (36.8 C)  98.8 F (37.1 C)   TempSrc: Oral  Oral   Resp: 19 25 16    Height:      Weight:      SpO2: 95% 93% 91% 95%   Weight change:   Intake/Output Summary (Last 24 hours) at 11/27/15 1038 Last data filed at 11/27/15 0900  Gross per 24 hour  Intake    290 ml  Output    825 ml  Net   -535 ml   General: NAD, obese, anxious Lungs: faint wheezing, improved air movement.  Cardiac: RRR, no murmurs GI: soft, active bowel sounds Neuro: CN II-XII grossly intact Skin: warm and dry  Lab Results: Basic Metabolic Panel:  Recent Labs Lab 11/22/15 0935 11/23/15 0330 11/23/15 0852 11/24/15 0943  NA  --  137  --  136  K  --  4.7  --  4.4  CL  --  94*  --  90*  CO2  --  34*  --  35*  GLUCOSE  --  132*  --  153*  BUN  --  7  --  13  CREATININE  --  0.62  --  0.78  CALCIUM  --  8.4*  --  8.7*  MG 1.3*  --  2.1  --    Liver Function Tests:  Recent Labs Lab 11/22/15 0800  AST 20  ALT 14  ALKPHOS 40  BILITOT 0.2*  PROT 6.3*  ALBUMIN 3.7   CBC:  Recent Labs Lab 11/23/15 0330 11/24/15 0943  WBC 8.4 7.2  HGB 10.7* 11.2*  HCT 36.4 36.2  MCV 95.8 95.3  PLT 221 248   Cardiac Enzymes:  Recent Labs Lab 11/22/15 1654 11/25/15 1130  TROPONINI 0.03 <0.03      Micro Results: Recent Results (from the past 240 hour(s))  Blood Culture (routine x 2)     Status: None   Collection Time: 11/22/15  8:00 AM  Result Value Ref Range Status   Specimen Description BLOOD RIGHT HAND  Final   Special Requests BOTTLES DRAWN AEROBIC AND ANAEROBIC 5CC  Final   Culture NO GROWTH 5 DAYS  Final   Report Status 11/27/2015 FINAL  Final  Blood Culture (routine x 2)     Status: None   Collection Time: 11/22/15  8:58 AM  Result Value Ref Range Status   Specimen Description BLOOD LEFT ANTECUBITAL  Final   Special Requests BOTTLES DRAWN AEROBIC AND ANAEROBIC 5CC  Final   Culture NO GROWTH 5 DAYS  Final   Report Status 11/27/2015 FINAL  Final  Urine culture     Status: None   Collection Time: 11/22/15  1:30 PM  Result Value Ref Range Status   Specimen Description URINE, CLEAN CATCH  Final   Special Requests NONE  Final   Culture MULTIPLE SPECIES PRESENT, SUGGEST RECOLLECTION  Final   Report Status 11/24/2015 FINAL  Final  Respiratory virus antigens panel     Status: None   Collection Time: 11/22/15  3:32 PM  Result Value Ref Range Status   Respiratory Syncytial Virus A Negative Negative Final   Respiratory Syncytial Virus B Negative Negative Final  Influenza A Negative Negative Final   Influenza B Negative Negative Final   Parainfluenza 1 Negative Negative Final   Parainfluenza 2 Negative Negative Final   Parainfluenza 3 Negative Negative Final   Metapneumovirus Negative Negative Final   Rhinovirus Negative Negative Final   Adenovirus Negative Negative Final    Comment: (NOTE) Performed At: [facility] [address] [place], [place] [id number] Mila Homer MD [id number]   MRSA PCR Screening     Status: None   Collection Time: 11/23/15  2:04 AM  Result Value Ref Range Status   MRSA by PCR NEGATIVE NEGATIVE Final    Comment:        The GeneXpert MRSA Assay (FDA approved for NASAL specimens only), is one component of a comprehensive MRSA colonization surveillance program. It is not intended to diagnose MRSA infection nor to guide or monitor treatment for MRSA infections.   Culture, sputum-assessment     Status: None   Collection Time: 11/25/15  9:58 PM  Result Value Ref Range Status   Specimen Description SPUTUM  Final   Special Requests NONE  Final   Sputum evaluation   Final    THIS SPECIMEN IS ACCEPTABLE. RESPIRATORY CULTURE REPORT  TO FOLLOW.   Report Status 11/25/2015 FINAL  Final   Medications: I have reviewed the patient's current medications. Scheduled Meds: . antiseptic oral rinse  7 mL Mouth Rinse q12n4p  . azelastine  2 spray Each Nare BID  . chlorhexidine  15 mL Mouth Rinse BID  . chlorpheniramine-HYDROcodone  5 mL Oral Q12H  . dextromethorphan-guaiFENesin  1 tablet Oral BID  . diclofenac sodium  4 g Topical QID  . fluticasone  2 spray Each Nare BID  . heparin  5,000 Units Subcutaneous 3 times per day  . ipratropium-albuterol  3 mL Nebulization QID  . methylPREDNISolone (SOLU-MEDROL) injection  40 mg Intravenous Once  . pantoprazole  40 mg Oral BID  . [START ON 11/28/2015] predniSONE  50 mg Oral Q breakfast   Continuous Infusions:  PRN Meds:.acetaminophen, albuterol, ALPRAZolam, nitroGLYCERIN, ondansetron (ZOFRAN) IV Assessment/Plan: Active Problems:   Adjustment disorder with mixed anxiety and depressed mood   Psoriasis   Pneumonia   Sepsis (HCC)   PNA (pneumonia)   Community acquired pneumonia   Cough   Hypercapnic respiratory failure, chronic (HCC)   OSA (obstructive sleep apnea)   COPD, severe (HCC)   Immunosuppressed status (HCC)   Goals of care, counseling/discussion   Respiratory failure, acute and chronic (HCC)   Acute COPD exacerbation--slowly improving -  Stopped azithromycin after she completed 5 day course yesterday  - duonebs q4h, albuterol inhaler q2h prn, and albuterol neb q4h prn - solumedrol 40mg  BID, then prednisone 50mg  daily tomorrow.  - follow up sputum culture and gram stain - continue home mucinex DM - oxygen therapy to maintain O2 sats between 88-92% - PCCM following, appreciate their recommendations.  - continue PPI - transfer to tele floor  Anxiety-- hx of intentional benzo overdose in 2014 that did not require intubation.  - conintue xanax 1mg  TID at home.   FEN - HH diet  Code: FULL code, palliative care meeting today.   Dispo: Disposition is deferred  at this time, awaiting improvement of current medical problems. The patient does have a current PCP ( , MD) and does not need an [facility] hospital follow-up appointment after discharge.   .Services Needed at time of discharge: Y = Yes, Blank = No PT:   OT:   RN:  Equipment:   Other:     LOS: 5 days   Denton Brick, MD 11/27/2015, 10:38 AM    Date: 11/27/2015  Patient name: Connar Rothenberger [facility]  Medical record number: [id number]  Date of birth: 1961-07-21   This patient's plan of care was discussed with the house staff. Please see their note for complete details. I concur with their findings.  Patient more comfortable this am off of BIPAP. Greatly appreciate Dr. Roxy Cedar help with this patient.  She is now sp 5 days of abx so ok to dc her azithromycin but will continue systemic steroids, BiPAP when needed and she is on meds for GERD.    Denton Brick, MD 11/27/2015, 10:38 AM   Date: 11/27/2015  Patient name: Wendy [name]  Medical record number: [id number]  Date of birth: [birth year]   This patient's plan of care was discussed with the house staff. Please see their note for complete details. I concur with their findings.  Mrs. Jola Baptist much more comfortable this morning less anxious and in less respiratory distress she is lying without use of BiPAP and appears dramatically better than I saw her yesterday.  We'll continue with current therapies changing her IV corpus steroids to oral prednisone and transfer her out of stepdown  Randall Hiss, MD 11/27/2015, 1:43 PM

## 2015-11-27 NOTE — Progress Notes (Signed)
Subjective: Patient was on nasal cannula all night. This AM looks comfortable. O2 sat >95% on 2L nasal cannula (home nasal cannula is 3L O2)  Objective: Vital signs in last 24 hours: Filed Vitals:   11/27/15 0026 11/27/15 0446 11/27/15 0810 11/27/15 0811  BP: 176/86 167/83 170/94 170/94  Pulse: 76 80 95 88  Temp: 97.9 F (36.6 C) 98.3 F (36.8 C)  98.8 F (37.1 C)  TempSrc: Oral Oral  Oral  Resp: 15 19 25 16   Height:      Weight:      SpO2: 95% 95% 93% 91%   Weight change:   Intake/Output Summary (Last 24 hours) at 11/27/15 0854 Last data filed at 11/27/15 0844  Gross per 24 hour  Intake    170 ml  Output    825 ml  Net   -655 ml    Physical Exam General Appearance:    Alert, cooperative, no distress  Head:    Normocephalic, without obvious abnormality, atraumatic  Eyes:    conjunctiva/corneas clear, EOM's intact  Nose:   Nares normal,no rhinorrhea  Throat:   Lips, mucosa, and tongue normal; teeth and gums normal  Back:     Symmetric, no curvature, ROM normal, no CVA tenderness  Lungs:     Clear to auscultation bilaterally, respirations shallow with pursed lips (normal for this pt with severe COPD)  Chest Wall:    No tenderness or deformity   Heart:    Regular rate and rhythm, S1 and S2 normal, no murmur, rub   or gallop  Abdomen:     Soft, distended, no masses, no organomegaly. Tender in the epigastric region, LUQ, and RUQ  Extremities:   Extremities normal, atraumatic, no cyanosis or edema  Skin:   Skin color, texture, turgor normal, no rashes or lesions  Neurologic:   Alert and oriented, normal strength, sensation and reflexes    throughout   Lab Results: Basic Metabolic Panel:  Recent Labs  72/62/03 0943  NA 136  K 4.4  CL 90*  CO2 35*  GLUCOSE 153*  BUN 13  CREATININE 0.78  CALCIUM 8.7*   Liver Function Tests: No results for input(s): AST, ALT, ALKPHOS, BILITOT, PROT, ALBUMIN in the last 72 hours. No results for input(s): LIPASE, AMYLASE in the  last 72 hours. No results for input(s): AMMONIA in the last 72 hours. CBC:  Recent Labs  11/24/15 0943  WBC 7.2  HGB 11.2*  HCT 36.2  MCV 95.3  PLT 248   Cardiac Enzymes:  Recent Labs  11/25/15 1130  TROPONINI <0.03   BNP: No results for input(s): PROBNP in the last 72 hours. D-Dimer: No results for input(s): DDIMER in the last 72 hours. CBG: No results for input(s): GLUCAP in the last 72 hours. Hemoglobin A1C: No results for input(s): HGBA1C in the last 72 hours. Fasting Lipid Panel: No results for input(s): CHOL, HDL, LDLCALC, TRIG, CHOLHDL, LDLDIRECT in the last 72 hours. Thyroid Function Tests: No results for input(s): TSH, T4TOTAL, FREET4, T3FREE, THYROIDAB in the last 72 hours. Anemia Panel: No results for input(s): VITAMINB12, FOLATE, FERRITIN, TIBC, IRON, RETICCTPCT in the last 72 hours. Coagulation: No results for input(s): LABPROT, INR in the last 72 hours. Urine Drug Screen: Drugs of Abuse     Component Value Date/Time   LABOPIA NONE DETECTED 11/27/2012 0017   COCAINSCRNUR NONE DETECTED 11/27/2012 0017   LABBENZ POSITIVE* 11/27/2012 0017   AMPHETMU NONE DETECTED 11/27/2012 0017   THCU NONE DETECTED 11/27/2012 0017  LABBARB NONE DETECTED 11/27/2012 0017    Alcohol Level: No results for input(s): ETH in the last 72 hours. Urinalysis: No results for input(s): COLORURINE, LABSPEC, PHURINE, GLUCOSEU, HGBUR, BILIRUBINUR, KETONESUR, PROTEINUR, UROBILINOGEN, NITRITE, LEUKOCYTESUR in the last 72 hours.  Invalid input(s): APPERANCEUR   Micro Results: Recent Results (from the past 240 hour(s))  Blood Culture (routine x 2)     Status: None (Preliminary result)   Collection Time: 11/22/15  8:00 AM  Result Value Ref Range Status   Specimen Description BLOOD RIGHT HAND  Final   Special Requests BOTTLES DRAWN AEROBIC AND ANAEROBIC 5CC  Final   Culture NO GROWTH 4 DAYS  Final   Report Status PENDING  Incomplete  Blood Culture (routine x 2)     Status: None  (Preliminary result)   Collection Time: 11/22/15  8:58 AM  Result Value Ref Range Status   Specimen Description BLOOD LEFT ANTECUBITAL  Final   Special Requests BOTTLES DRAWN AEROBIC AND ANAEROBIC 5CC  Final   Culture NO GROWTH 4 DAYS  Final   Report Status PENDING  Incomplete  Urine culture     Status: None   Collection Time: 11/22/15  1:30 PM  Result Value Ref Range Status   Specimen Description URINE, CLEAN CATCH  Final   Special Requests NONE  Final   Culture MULTIPLE SPECIES PRESENT, SUGGEST RECOLLECTION  Final   Report Status 11/24/2015 FINAL  Final  Respiratory virus antigens panel     Status: None   Collection Time: 11/22/15  3:32 PM  Result Value Ref Range Status   Respiratory Syncytial Virus A Negative Negative Final   Respiratory Syncytial Virus B Negative Negative Final   Influenza A Negative Negative Final   Influenza B Negative Negative Final   Parainfluenza 1 Negative Negative Final   Parainfluenza 2 Negative Negative Final   Parainfluenza 3 Negative Negative Final   Metapneumovirus Negative Negative Final   Rhinovirus Negative Negative Final   Adenovirus Negative Negative Final    Comment: (NOTE) Performed At: [facility] [address] [place], [place] [id number] Mila Homer MD SH:[id number]   MRSA PCR Screening     Status: None   Collection Time: 11/23/15  2:04 AM  Result Value Ref Range Status   MRSA by PCR NEGATIVE NEGATIVE Final    Comment:        The GeneXpert MRSA Assay (FDA approved for NASAL specimens only), is one component of a comprehensive MRSA colonization surveillance program. It is not intended to diagnose MRSA infection nor to guide or monitor treatment for MRSA infections.   Culture, sputum-assessment     Status: None   Collection Time: 11/25/15  9:58 PM  Result Value Ref Range Status   Specimen Description SPUTUM  Final   Special Requests NONE  Final   Sputum evaluation   Final    THIS SPECIMEN IS ACCEPTABLE.  RESPIRATORY CULTURE REPORT TO FOLLOW.   Report Status 11/25/2015 FINAL  Final   Studies/Results: No results found. Medications:  . antiseptic oral rinse  7 mL Mouth Rinse q12n4p  . azelastine  2 spray Each Nare BID  . chlorhexidine  15 mL Mouth Rinse BID  . chlorpheniramine-HYDROcodone  5 mL Oral Q12H  . dextromethorphan-guaiFENesin  1 tablet Oral BID  . diclofenac sodium  4 g Topical QID  . famotidine (PEPCID) IV  20 mg Intravenous Q24H  . fluticasone  2 spray Each Nare BID  . heparin  5,000 Units Subcutaneous 3 times per day  .  ipratropium-albuterol  3 mL Nebulization QID  . methylPREDNISolone (SOLU-MEDROL) injection  40 mg Intravenous Q12H  . pantoprazole (PROTONIX) IV  40 mg Intravenous Q12H   acetaminophen, albuterol, ALPRAZolam, nitroGLYCERIN, ondansetron (ZOFRAN) IV  Assessment/Plan: Active Problems:   Psoriasis   Pneumonia   Sepsis (HCC)   PNA (pneumonia)   Community acquired pneumonia   Cough   Hypercapnic respiratory failure, chronic (HCC)   OSA (obstructive sleep apnea)   COPD, severe (HCC)   Immunosuppressed status (HCC)   Goals of care, counseling/discussion   COPD exacerbation in setting of immunocompromised status The patient is immunocompromised due to home medications of prednisone 20mg  daily and humira 2x month. She presented with productive cough, chest pain, and worsening shortness of breath. She was found to be febrile and tachycardic. Although CXR revealed very faint patchy RUL infiltrate, a more likely diagnosis is COPD exacerbation. Repeat CXR showed stable, non-worsening infiltrate. The patient has severe COPD with PFT 02/13/2013 showing FEV1 of 49%. Her last hospitalization for COPD was in 2015 and she feels this episode is very similar. Recent sick contacts include her grand daughter who is 103 months old and often has a URI.  Updates No acute events overnight. On nasal cannula all night and this AM.  Treatments - Continue nasal cannula 3L O2 (this  is what she uses at home) - Transfer to tele floor bed - DC antibiotics; Completed 5 day course of azithromycin 250 mg daily - PT/OT - Palliative care consult - DC solumedrol - Begin Prednisone 50 mg daily - Continue duoneb 4xday - Continue Mucinex, flonase, tylenol - Continue voltaren gel 4xday for sore chest from coughing Diagnostics - F/u sputum gram stain and culture (sent 11/26/15) - Strep pneumo negative - Respiratory viral panel negative - Flu negative - UA clean - Blood cultures NG x4 days - Consider follow up CXR (patchy infiltrate seen 11/23/15); will defer for now given patient is doing well and clinically much improved  Anxiety Takes xanax 1 mg TID at home. Has a history of intentional overdose with alprazolam in 2014. Was originally started on xanax 0.5 mg TID here in the hospital. She became progressively more anxious and endorsed having episodes where she panicked and held her breath, thus causing her to desat. We increased her xanax to her home dose of 1 mg TID. The patient was glad. - Continue home xanax 1 mg TID  GERD - Change Pantoprazole to 40 mg po BID - Consider DC famotidine 20 mg IV daily  VTE prophylaxis Heparin 5000 units TID   This is a 2015 Note.  The care of the patient was discussed with Dr. Psychologist, occupational and the assessment and plan formulated with their assistance.  Please see their attached note for official documentation of the daily encounter.   LOS: 5 days   Danella Penton, Med Student 11/27/2015, 8:54 AM

## 2015-11-27 NOTE — Progress Notes (Signed)
Report to RN, Cam[place]ko for [address] room 22.

## 2015-11-27 NOTE — Consult Note (Signed)
Consultation Note Date: 11/27/2015   Patient Name: Wendy [name]  DOB: 1961/04/02  MRN: [id number]  Age / Sex: 55 y.o., female  PCP: Dessa Phi, MD Referring Physician: Randall Hiss, MD  Reason for Consultation: Establishing goals of care, Non pain symptom management and Psychosocial/spiritual support    Clinical Assessment/Narrative: Patient is a 55 year old female with end-stage COPD he was hospitalized with acute respiratory failure. Patient is less short of breath today. She has been moved to a regular floor. She reports becoming acutely and critically ill which is a change from what she has experienced in the past. She reports that even the slightest movement makes her short of breath but she does feel that she is getting better. Her chart was reviewed, and she initially refused palliative care consult but later agreed to speak with me. Given this, my goal was to introduce palliative care services, described what we are, differentiate [place] from hospice. I did also spend this time educating her on disease progression and tools such as opioids that can be utilized to help relieve the feeling of dyspnea, smothering. She reports that during a previous hospitalization she was given morphine and remembers feeling quite a bit of relief. At this point she is managing shortness of breath flareups with Xanax. I did also introduce the concept of hospice with the caveat that I was not saying that she was hospice eligible or recommending that, but what support services they do offer and how to access hospice should she ever feel that that service meets her need. I think this was helpful, Mrs. Heinsohn, just like most people are not familiar with how hospice has changed along with our patient population changes. Hospice also is dealing more with end-stage heart and lung disease as opposed to strictly being end-of-life care for  cancer patients. I did stress that hospice would offer her after our support which sometimes can mean the difference between getting stable at home and avoiding the emergency room. Her goal is to get back to her baseline which is oxygen therapy around the clock, the ability to go out to supper, drive, and play with her grandchild. She does acknowledge that her world has gotten a lot smaller with the progression of her COPD, but she still verbalizes quite a bit of quality as well as hope.  Contacts/Participants in Discussion: Primary Decision Maker: Marvis Moeller   Relationship to Patient patient HCPOA: yes  Patient's mother is alive as well as her daughter who she reports no use for wishes (she did not wish to share those with me)  SUMMARY OF RECOMMENDATIONS Preliminary goal was to just introduce palliative medicine services Provided education on opioids as an additional total to manage advanced dyspnea with COPD, risks and benefits Discussed generally hospice services, what they provide and how to access those services Code Status/Advance Care Planning: Full code    Code Status Orders        Start     Ordered   11/22/15 1502  Full code   Continuous     11/22/15 1505    Code Status History    Date Active Date Inactive Code Status Order ID Comments User Context   07/01/2014  5:41 PM 07/04/2014  5:14 PM DNR [id number]  Esperanza Sheets, MD Inpatient   10/06/2013  1:01 AM 10/13/2013  7:25 PM Full Code [id number]  Hillary Bow, DO ED   11/27/2012  3:47 AM 12/01/2012  6:46 PM Full Code [id number]  Brooke Pace  McQuaid, MD Inpatient      Other Directives:None  Symptom Management:   Pain: She denies any pain that is not relieved by over-the-counter mild agents such as Tylenol  Dyspnea: Right now she was managing "flareups" with Xanax. Recommended that she discuss with her pulmonologist the role of opioids as her disease progresses going forward. Patient has received morphine in the past and  did well  Palliative Prophylaxis:   Bowel Regimen, Frequent Pain Assessment, Oral Care and Turn Reposition  Additional Recommendations (Limitations, Scope, Preferences):  Full Scope Treatment   Psycho-social/Spiritual:  Support System: Strong Desire for further Chaplaincy support:no   Prognosis: Unable to determine  Discharge Planning: Home with Home Health   Chief Complaint/ Primary Diagnoses: Present on Admission:  . Pneumonia . Sepsis (HCC) . PNA (pneumonia) . Psoriasis . Adjustment disorder with mixed anxiety and depressed mood  I have reviewed the medical record, interviewed the patient and family, and examined the patient. The following aspects are pertinent.  Past Medical History  Diagnosis Date  . Asthma   . Seasonal allergies   . Poor dentition   . Alcoholism (HCC)   . COPD (chronic obstructive pulmonary disease) (HCC)   . Depression   . Oxygen dependent     home oxygen 3L/min  . Urinary, incontinence, stress female   . Psoriasis    Social History   Social History  . Marital Status: Widowed    Spouse Name: N/A  . Number of Children: 4  . Years of Education: N/A   Occupational History  . disabled     was a Production designer, theatre/television/film at [employer]   Social History Main Topics  . Smoking status: Former Smoker -- 1.00 packs/day for 40 years    Types: Cigarettes    Start date: 11/10/1970    Quit date: 09/17/2013  . Smokeless tobacco: Never Used  . Alcohol Use: No     Comment: 5 cans of beer daily  " 06/2014 drinks occasional ".  3s/2/16 No longer drink  . Drug Use: No  . Sexual Activity:    Partners: Male    Birth Control/ Protection: None   Other Topics Concern  . None   Social History Narrative   Family History  Problem Relation Age of Onset  . Lung cancer Father   . COPD Father    Scheduled Meds: . antiseptic oral rinse  7 mL Mouth Rinse q12n4p  . azelastine  2 spray Each Nare BID  . chlorhexidine  15 mL Mouth Rinse BID  .  chlorpheniramine-HYDROcodone  5 mL Oral Q12H  . dextromethorphan-guaiFENesin  1 tablet Oral BID  . diclofenac sodium  4 g Topical QID  . fluticasone  2 spray Each Nare BID  . heparin  5,000 Units Subcutaneous 3 times per day  . ipratropium-albuterol  3 mL Nebulization QID  . methylPREDNISolone (SOLU-MEDROL) injection  40 mg Intravenous Once  . pantoprazole  40 mg Oral BID  . [START ON 11/28/2015] predniSONE  50 mg Oral Q breakfast   Continuous Infusions:  PRN Meds:.acetaminophen, albuterol, ALPRAZolam, nitroGLYCERIN, ondansetron (ZOFRAN) IV Medications Prior to Admission:  Prior to Admission medications   Medication Sig Start Date End Date Taking? Authorizing Provider  ADVAIR DISKUS 500-50 MCG/DOSE AEPB INHALE 1 PUFF INTO THE LUNGS 2 TIMES DAILY 08/29/15  Yes Waymon Budge, MD  albuterol (PROAIR HFA) 108 (90 BASE) MCG/ACT inhaler 2 puffs every 4 hours as needed- rescue Patient taking differently: Inhale 2 puffs into the lungs every 4 (four) hours  as needed for wheezing. 2 puffs every 4 hours as needed- rescue 03/02/15  Yes Clinton D Young, MD  ALPRAZolam Prudy Feeler) 1 MG tablet 1 tab, three times daily as needed Patient taking differently: Take 1 mg by mouth 3 (three) times daily as needed for anxiety. 1 tab, three times daily as needed 11/14/15  Yes Waymon Budge, MD  dextromethorphan-guaiFENesin [facility] DM) 30-600 MG 12hr tablet Take 1 tablet by mouth 2 (two) times daily.   Yes Historical Provider, MD  fluticasone (FLONASE) 50 MCG/ACT nasal spray PLACE 2 SPRAYS INTO BOTH NOSTRILS DAILY. 10/28/15  Yes Waymon Budge, MD  pantoprazole (PROTONIX) 20 MG tablet TAKE 1 TABLET (20 MG TOTAL) BY MOUTH DAILY BEFORE SUPPER. 08/24/15  Yes Waymon Budge, MD  predniSONE (DELTASONE) 10 MG tablet TAKE 1 OR 2 TABLETS DAILY AS NEEDED 11/16/15  Yes Waymon Budge, MD  SPIRIVA HANDIHALER 18 MCG inhalation capsule PLACE 1 CAPSULE (18 MCG TOTAL) INTO INHALER AND INHALE DAILY. 10/18/15  Yes Waymon Budge, MD   theophylline (UNIPHYL) 400 MG 24 hr tablet Take 0.5 tablets (200 mg total) by mouth 2 (two) times daily with a meal. 12/07/14  Yes Waymon Budge, MD  traZODone (DESYREL) 50 MG tablet Take 1 tablet (50 mg total) by mouth at bedtime. 11/07/15  Yes Waymon Budge, MD  Adalimumab (HUMIRA PEN-CROHNS STARTER) 40 MG/0.8ML PNKT Inject 40 mg into the skin every 14 (fourteen) days. 05/17/15   Josalyn Funches, MD  albuterol (PROVENTIL) (2.5 MG/3ML) 0.083% nebulizer solution Take 2.5 mg by nebulization every 6 (six) hours as needed for wheezing or shortness of breath.    Historical Provider, MD  furosemide (LASIX) 20 MG tablet 1 tablet every other day as needed Patient not taking: Reported on 11/22/2015 07/30/14   Waymon Budge, MD   Allergies  Allergen Reactions  . Augmentin [Amoxicillin-Pot Clavulanate] Nausea Only  . Cefdinir Other (See Comments)    Aches, "heart fluttering"  . Codeine Nausea Only  . Fexofenadine Nausea Only    allegra  . Other     PAIN MEDICATIONS-nausea    Review of Systems  Constitutional: Positive for chills, activity change and fatigue.  HENT: Positive for congestion.   Eyes: Negative.   Respiratory: Positive for cough, shortness of breath and wheezing.   Cardiovascular: Negative.   Endocrine: Negative.   Genitourinary: Negative.   Musculoskeletal: Negative.   Allergic/Immunologic: Positive for immunocompromised state.  Neurological: Positive for weakness.  Hematological: Negative.   Psychiatric/Behavioral:       Anxious    Physical Exam  Constitutional: She is oriented to person, place, and time. She appears well-nourished.  HENT:  Head: Normocephalic and atraumatic.  Neck: Normal range of motion.  Cardiovascular: Normal rate and regular rhythm.   Respiratory:  Mild increased work of breathing at rest  Musculoskeletal: Normal range of motion.  Neurological: She is alert and oriented to person, place, and time.  Skin: Skin is warm and dry.  Psychiatric:   anxious    Vital Signs: BP 176/84 mmHg  Pulse 81  Temp(Src) 98.5 F (36.9 C) (Oral)  Resp 17  Ht 5\' 1"  (1.549 m)  Wt 68.947 kg (152 lb)  BMI 28.74 kg/m2  SpO2 91%  SpO2: SpO2: 91 % O2 Device:SpO2: 91 % O2 Flow Rate: .O2 Flow Rate (L/min): 2 L/min  IO: Intake/output summary:  Intake/Output Summary (Last 24 hours) at 11/27/15 1449 Last data filed at 11/27/15 0900  Gross per 24 hour  Intake  240 ml  Output    575 ml  Net   -335 ml    LBM: Last BM Date: 11/23/15 Baseline Weight: Weight: 68.947 kg (152 lb) Most recent weight: Weight: 68.947 kg (152 lb)      Palliative Assessment/Data:  Flowsheet Rows        Most Recent Value   Intake Tab    Referral Department  Pulmonary   Unit at Time of Referral  Cardiac/Telemetry Unit   Palliative Care Primary Diagnosis  Pulmonary   Date Notified  11/26/15   Palliative Care Type  New Palliative care   Reason for referral  Clarify Goals of Care   Date of Admission  11/22/15   Date first seen by Palliative Care  11/27/15   # of days Palliative referral response time  1 Day(s)   # of days IP prior to Palliative referral  4   Clinical Assessment    Palliative Performance Scale Score  40%   Pain Max last 24 hours  0   Pain Min Last 24 hours  0   Dyspnea Max Last 24 Hours  6   Dyspnea Min Last 24 hours  3   Nausea Max Last 24 Hours  0   Nausea Min Last 24 Hours  0   Anxiety Max Last 24 Hours  6   Anxiety Min Last 24 Hours  4   Psychosocial & Spiritual Assessment    Palliative Care Outcomes    Patient/Family meeting held?  Yes   Who was at the meeting?  mother, dtr   Palliative Care follow-up planned  No      Additional Data Reviewed:  CBC:    Component Value Date/Time   WBC 7.2 11/24/2015 0943   HGB 11.2* 11/24/2015 0943   HCT 36.2 11/24/2015 0943   PLT 248 11/24/2015 0943   MCV 95.3 11/24/2015 0943   NEUTROABS 9.5* 10/05/2013 2022   LYMPHSABS 0.5* 10/05/2013 2022   MONOABS 1.1* 10/05/2013 2022   EOSABS 0.0  10/05/2013 2022   BASOSABS 0.0 10/05/2013 2022   Comprehensive Metabolic Panel:    Component Value Date/Time   NA 136 11/24/2015 0943   K 4.4 11/24/2015 0943   CL 90* 11/24/2015 0943   CO2 35* 11/24/2015 0943   BUN 13 11/24/2015 0943   CREATININE 0.78 11/24/2015 0943   GLUCOSE 153* 11/24/2015 0943   CALCIUM 8.7* 11/24/2015 0943   AST 20 11/22/2015 0800   ALT 14 11/22/2015 0800   ALKPHOS 40 11/22/2015 0800   BILITOT 0.2* 11/22/2015 0800   PROT 6.3* 11/22/2015 0800   ALBUMIN 3.7 11/22/2015 0800     Time In: 1300 Time Out: 1315 Time Total: 75 min Greater than 50%  of this time was spent counseling and coordinating care related to the above assessment and plan.  Signed by: Irean Hong, NP  Irean Hong, NP  11/27/2015, 2:49 PM  Please contact Palliative Medicine Team phone at [phone] for questions and concerns.

## 2015-11-27 NOTE — Assessment & Plan Note (Signed)
She remains anxious, "afraid to go to sleep". Steroids and stimulant meds magnify this.

## 2015-11-28 DIAGNOSIS — R0600 Dyspnea, unspecified: Secondary | ICD-10-CM

## 2015-11-28 DIAGNOSIS — R05 Cough: Secondary | ICD-10-CM

## 2015-11-28 LAB — CULTURE, RESPIRATORY: CULTURE: NORMAL

## 2015-11-28 LAB — STREP PNEUMONIAE URINARY ANTIGEN: STREP PNEUMO URINARY ANTIGEN: NEGATIVE

## 2015-11-28 LAB — CULTURE, RESPIRATORY W GRAM STAIN

## 2015-11-28 MED ORDER — SERTRALINE HCL 50 MG PO TABS
25.0000 mg | ORAL_TABLET | Freq: Every day | ORAL | Status: DC
  Administered 2015-11-28 – 2015-11-30 (×2): 25 mg via ORAL
  Filled 2015-11-28 (×3): qty 1

## 2015-11-28 NOTE — Progress Notes (Signed)
Patient states she's been having worsening SOB since taking zoloft and xanax after lunch. Rejeana Brock (med student) notified and stated they would continue to monitor. Patient denies respiratory distress at this time.  Leanna Battles, RN.

## 2015-11-28 NOTE — Evaluation (Signed)
Occupational Therapy Evaluation Patient Details Name: Wendy [name] MRN: [id number] DOB: 12/28/1960 Today's Date: 11/28/2015    History of Present Illness 55 year old Caucasian lady with severe COPD, likely untreated obstructive sleep apnea, psoriasis on tumor necrosis factor antagonist therapy who was admitted with worsening cough with production of yellow sputum over the last week. She has tried to treat this by increasing her O2 through 3-3.5 L and using her home inhalers but had no improvement in her breathing. She also measured a temperature at home of 104 and had an episode of non-bloody emesis. She has some left-sided chest pain that occurs with coughing.. Admitted on 11/22/15 w/ dx: COPD exacerbation, PNA. See medical history in chart for complete PMH.   Clinical Impression   Pt currently demonstrates deficits in her ability to perform ADL's and functional transfers related to ADL's (see OT problem list below), she should benefit from acute OT to address deconditioning, DME recommendations as well as energy conservation techniques. Will follow acutely for OT. Discussed use of 3:1 over toilet and in shower at home for safety and energy conservation.    Follow Up Recommendations  No OT follow up;Supervision - Intermittent    Equipment Recommendations  3 in 1 bedside comode    Recommendations for Other Services       Precautions / Restrictions Precautions Precautions: Other (comment) Precaution Comments: O2 via nasal cannula 3.5 L  Restrictions Weight Bearing Restrictions: No      Mobility Bed Mobility Overal bed mobility:  (Pt up ambulating w/ PT upon OT arrival)                Transfers Overall transfer level: Needs assistance Equipment used: Rolling walker (2 wheeled) Transfers: Sit to/from [employer] Sit to Stand: Min assist Stand pivot transfers: Min guard       General transfer comment: Pt was min assist standing from toilet, should be  less assist from elevated surface and use of 3:1 over toilet and in shower.    Balance Overall balance assessment: No apparent balance deficits (not formally assessed)                                          ADL Overall ADL's : Needs assistance/impaired Eating/Feeding: Independent;Sitting   Grooming: Wash/dry hands;Wash/dry face;Modified independent;Standing Grooming Details (indicate cue type and reason): Pt noted to take rest break and performed purse lip breathing standing at sink when she became fatigued. Upper Body Bathing: Set up;Sitting   Lower Body Bathing: Set up;Sit to/from stand   Upper Body Dressing : Modified independent;Sitting       Toilet Transfer: Minimal assistance;Ambulation;RW;Regular Teacher, adult education Details (indicate cue type and reason): Pt relied heavily on RW and should benefit from 3:1 over toilet. Discussed this with pt for safety as pt reported difficulty standing up from toilet at home as well. Toileting- Clothing Manipulation and Hygiene: Minimal assistance;Sitting/lateral lean;Sit to/from stand   Tub/ Shower Transfer: Walk-in shower;Supervision/safety;Ambulation;3 in 1;Rolling walker Tub/Shower Transfer Details (indicate cue type and reason): Discussed use of 3:1 in walk in shower as well for increased safety and energy conservation. Pt verbalized understanding of this. Care management made aware of DME needs. Functional mobility during ADLs: Supervision/safety;Min guard;Rolling walker General ADL Comments: Pt currently demonstrates deficits in her ability to perform ADL's and functional transfers related to ADL's, she should benefit from acute OT to address deconditioning and  for DME recommendations as well as energy conservation techniques. Will follow acutely for OT. Discussed use of 3:1 over toilet and in shower at home for safety and energy conservation. Pt verbalized understanding of this.     Vision  Wears reading glasses  PRN, No change from baseline.   Perception     Praxis      Pertinent Vitals/Pain Pain Assessment: No/denies pain     Hand Dominance Right   Extremity/Trunk Assessment Upper Extremity Assessment Upper Extremity Assessment: Overall WFL for tasks assessed;Generalized weakness (General deconditioning)   Lower Extremity Assessment Lower Extremity Assessment: Defer to PT evaluation       Communication Communication Communication: No difficulties   Cognition Arousal/Alertness: Awake/alert Behavior During Therapy: WFL for tasks assessed/performed Overall Cognitive Status: Within Functional Limits for tasks assessed                     General Comments       Exercises       Shoulder Instructions      Home Living Family/patient expects to be discharged to:: Private residence Living Arrangements: Spouse/significant other;Children (Pt son has Down Syndrome but is able to assist pt PRN ) Available Help at Discharge: Family;Available 24 hours/day Type of Home: House       Home Layout: One level     Bathroom Shower/Tub: Tub/shower unit;Walk-in shower   Bathroom Toilet: Standard     Home Equipment: Walker - 2 wheels          Prior Functioning/Environment Level of Independence: Independent with assistive device(s)        Comments: Pt states that her significant other is not able to assist her much w/ daily activities but that her son, w/ Down Syndrome is able to assist PRN.    OT Diagnosis: Generalized weakness   OT Problem List: Decreased activity tolerance;Decreased knowledge of use of DME or AE   OT Treatment/Interventions: Self-care/ADL training;Energy conservation;DME and/or AE instruction;Patient/family education;Therapeutic activities    OT Goals(Current goals can be found in the care plan section) Acute Rehab OT Goals Patient Stated Goal: Go home when able w/ family PRN assist Time For Goal Achievement: 12/12/15 Potential to Achieve Goals:  Good ADL Goals Pt Will Perform Grooming: Independently;sitting;standing Pt Will Transfer to Toilet: with modified independence;ambulating;bedside commode Pt Will Perform Toileting - Clothing Manipulation and hygiene: with modified independence;sitting/lateral leans;sit to/from stand Pt Will Perform Tub/Shower Transfer: Shower transfer;ambulating;3 in 1;rolling walker;with supervision Additional ADL Goal #1: Pt will independently state 2-3 energy conservation techniques and implement them during ADL/self care tasks.  OT Frequency: Min 2X/week   Barriers to D/C:            Co-evaluation              End of Session Equipment Utilized During Treatment: Gait belt;Rolling walker;Oxygen Nurse Communication: Mobility status;Other (comment) (Rec 3:1 for toilet and in shower at d/c)  Activity Tolerance: Patient tolerated treatment well;Patient limited by fatigue Patient left: in chair;with call bell/phone within reach;with family/visitor present   Time: 0037-0488 OT Time Calculation (min): 19 min Charges:  OT General Charges $OT Visit: 1 Procedure OT Evaluation $OT Eval Moderate Complexity: 1 Procedure G-Codes:    [place], Amy Beth Dixon, OTR/L 11/28/2015, 11:55 AM

## 2015-11-28 NOTE — Evaluation (Signed)
Physical Therapy Evaluation Patient Details Name: Wendy [name] MRN: [id number] DOB: 27-Feb-1961 Today's Date: 11/28/2015   History of Present Illness  55 year old Caucasian lady with severe COPD, likely untreated obstructive sleep apnea, psoriasis on tumor necrosis factor antagonist therapy who was admitted with worsening cough with production of yellow sputum over the last week.  Clinical Impression  Pt admitted with above diagnosis. Pt currently with functional limitations due to the deficits listed below (see PT Problem List). Pt tolerated ambulation well with RW.  Pt will benefit from skilled PT to increase their independence and safety with mobility to allow discharge to the venue listed below.       Follow Up Recommendations No PT follow up;Supervision/Assistance - 24 hour    Equipment Recommendations   (ROLLATOR)    Recommendations for Other Services       Precautions / Restrictions Precautions Precautions: Other (comment) (SOB) Precaution Comments: O2 via nasal cannula 3.5 L  Restrictions Weight Bearing Restrictions: No      Mobility  Bed Mobility Overal bed mobility:  (Pt up ambulating w/ PT upon OT arrival)             General bed mobility comments: pt up in chair upon PT arrival  Transfers Overall transfer level: Needs assistance Equipment used: Rolling walker (2 wheeled) Transfers: Sit to/from [employer] Sit to Stand: Min assist Stand pivot transfers: Min guard       General transfer comment: Pt was min assist standing from toilet, should be less assist from elevated surface and use of 3:1 over toilet and in shower.  Ambulation/Gait Ambulation/Gait assistance: Supervision Ambulation Distance (Feet): 50 Feet Assistive device: Rolling walker (2 wheeled) Gait Pattern/deviations: Step-through pattern;Decreased stride length Gait velocity: slow Gait velocity interpretation: Below normal speed for age/gender General Gait Details: pt  educated on purse lipped breathing, decreased pace due to SOB, no episodes of LOB or instabiity, just limtied by SOB  Stairs            Wheelchair Mobility    Modified Rankin (Stroke Patients Only)       Balance Overall balance assessment: No apparent balance deficits (not formally assessed)                                           Pertinent Vitals/Pain Pain Assessment: No/denies pain    Home Living Family/patient expects to be discharged to:: Private residence Living Arrangements: Spouse/significant other;Children (58 yo son with Ardeen Fillers) Available Help at Discharge: Family;Available 24 hours/day Type of Home: House Home Access: Stairs to enter   [employer] of Steps: 1 Home Layout: One level Home Equipment: Walker - 2 wheels      Prior Function Level of Independence: Independent with assistive device(s)         Comments: Pt states that her significant other is not able to assist her much w/ daily activities but that her son, w/ Down Syndrome is able to assist PRN.     Hand Dominance   Dominant Hand: Right    Extremity/Trunk Assessment   Upper Extremity Assessment: Overall WFL for tasks assessed           Lower Extremity Assessment: Overall WFL for tasks assessed      Cervical / Trunk Assessment: Normal  Communication   Communication: No difficulties  Cognition Arousal/Alertness: Awake/alert Behavior During Therapy: WFL for tasks assessed/performed Overall Cognitive  Status: Within Functional Limits for tasks assessed                      General Comments General comments (skin integrity, edema, etc.): discussed using a rollator to promote increased amb endurance and give her opportunity to sit and rest    Exercises        Assessment/Plan    PT Assessment Patient needs continued PT services  PT Diagnosis Generalized weakness   PT Problem List Decreased strength;Decreased activity  tolerance;Decreased range of motion;Cardiopulmonary status limiting activity  PT Treatment Interventions     PT Goals (Current goals can be found in the Care Plan section) Acute Rehab PT Goals Patient Stated Goal: Go home when able w/ family PRN assist PT Goal Formulation: With patient Time For Goal Achievement: 12/05/15 Potential to Achieve Goals: Good    Frequency Min 2X/week   Barriers to discharge        Co-evaluation               End of Session Equipment Utilized During Treatment: Gait belt;Oxygen (4LO2 via [place]) Activity Tolerance: Patient tolerated treatment well Patient left: with call bell/phone within reach (on commode with OT) Nurse Communication: Mobility status         Time: 1013-1040 PT Time Calculation (min) (ACUTE ONLY): 27 min   Charges:   PT Evaluation $PT Eval Low Complexity: 1 Procedure PT Treatments $Gait Training: 8-22 mins   PT G CodesMarcene Brawn 11/28/2015, 12:02 PM  Lewis Shock, PT, DPT Pager #: [phone] Office #: [phone]

## 2015-11-28 NOTE — Care Management Note (Addendum)
Case Management Note  Patient Details  Name: Wendy [name] MRN: [id number] Date of Birth: [birth year]  Subjective/Objective:          CM following for progression and d/c planning.          Action/Plan: Noted CM notes and pt assessment of 11/24/2015. This CM informed by PT that this pt will need a walker at the time of d/c. PT recommending rollator as this pt is ambulatory however becomes very SOB while walking and would benefit from being able to sit when she becomes SOB while walking.   Met with pt who requested a rollator and OT recommended a 3:1 and pt agreed. CM will order.  PT unsure is she needs HH services at this time. CM will continue to follow. Pt has family support in the home and is oxygen dependent in the home.  Expected Discharge Date:                  Expected Discharge Plan:  [place]  In-House Referral:     Discharge planning Services  CM Consult  Post Acute Care Choice:    Choice offered to:     DME Arranged:   Rollator and 3:1 DME Agency:   [facility]  HH Arranged:    [place] Agency:     Status of Service:  In process, will continue to follow  Medicare Important Message Given:    Date Medicare IM Given:    Medicare IM give by:    Date Additional Medicare IM Given:    Additional Medicare Important Message give by:     If discussed at [place] of Stay Meetings, dates discussed:    Additional Comments:  Adron Bene, RN 11/28/2015, 10:46 AM

## 2015-11-28 NOTE — Progress Notes (Signed)
Subjective: Has not required bipap for past 2 nights. Now on home 3L O2 nasal cannula. Anxiety continues to be an issue for her, causing her to be afraid to go home and afraid to work with PT. Patient was consoled today, will continue working on this together with her.   Objective: Vital signs in last 24 hours: Filed Vitals:   11/27/15 2210 11/28/15 0644 11/28/15 0810 11/28/15 0926  BP: 168/77 160/77 154/75   Pulse: 72 83 87   Temp: 98 F (36.7 C) 98.2 F (36.8 C) 98.9 F (37.2 C)   TempSrc: Oral Oral Oral   Resp: 20 20 18    Height:      Weight:      SpO2: 96% 96% 98% 93%   Weight change:   Intake/Output Summary (Last 24 hours) at 11/28/15 1145 Last data filed at 11/28/15 0600  Gross per 24 hour  Intake    360 ml  Output      0 ml  Net    360 ml    Physical Exam General Appearance:    Anxious, on edge. Alert, cooperative.  Head:    Normocephalic, without obvious abnormality, atraumatic  Eyes:    conjunctiva/corneas clear, EOM's intact  Nose:   Nares normal,no rhinorrhe  Throat:   Lips, mucosa, and tongue normal; teeth and gums normal  Back:     Symmetric, no curvature, ROM normal, no CVA tenderness  Lungs:     Clear to auscultation bilaterally, respirations unlabored, respiratory effort and volume of her breaths is tremendously improved.   Chest Wall:    Tender to palpation in the substernal region.   Heart:    Regular rate and rhythm, S1 and S2 normal, no murmur, rub   or gallop  Abdomen:     Tender in the epigastric, RUQ, LUQ. Soft, distended, no masses, no organomegaly  Extremities:   Extremities normal, atraumatic, no cyanosis or edema  Skin:   Skin color, texture, turgor normal, no rashes or lesions  Neurologic:   Alert and oriented, normal strength, sensation and reflexes    throughout   Lab Results: Basic Metabolic Panel: No results for input(s): NA, K, CL, CO2, GLUCOSE, BUN, CREATININE, CALCIUM, MG, PHOS in the last 72 hours. Liver Function Tests: No results  for input(s): AST, ALT, ALKPHOS, BILITOT, PROT, ALBUMIN in the last 72 hours. No results for input(s): LIPASE, AMYLASE in the last 72 hours. No results for input(s): AMMONIA in the last 72 hours. CBC: No results for input(s): WBC, NEUTROABS, HGB, HCT, MCV, PLT in the last 72 hours. Cardiac Enzymes: No results for input(s): CKTOTAL, CKMB, CKMBINDEX, TROPONINI in the last 72 hours. BNP: No results for input(s): PROBNP in the last 72 hours. D-Dimer: No results for input(s): DDIMER in the last 72 hours. CBG: No results for input(s): GLUCAP in the last 72 hours. Hemoglobin A1C: No results for input(s): HGBA1C in the last 72 hours. Fasting Lipid Panel: No results for input(s): CHOL, HDL, LDLCALC, TRIG, CHOLHDL, LDLDIRECT in the last 72 hours. Thyroid Function Tests: No results for input(s): TSH, T4TOTAL, FREET4, T3FREE, THYROIDAB in the last 72 hours. Anemia Panel: No results for input(s): VITAMINB12, FOLATE, FERRITIN, TIBC, IRON, RETICCTPCT in the last 72 hours. Coagulation: No results for input(s): LABPROT, INR in the last 72 hours. Urine Drug Screen: Drugs of Abuse     Component Value Date/Time   LABOPIA NONE DETECTED 11/27/2012 0017   COCAINSCRNUR NONE DETECTED 11/27/2012 0017   LABBENZ POSITIVE* 11/27/2012 0017  AMPHETMU NONE DETECTED 11/27/2012 0017   THCU NONE DETECTED 11/27/2012 0017   LABBARB NONE DETECTED 11/27/2012 0017    Alcohol Level: No results for input(s): ETH in the last 72 hours. Urinalysis: No results for input(s): COLORURINE, LABSPEC, PHURINE, GLUCOSEU, HGBUR, BILIRUBINUR, KETONESUR, PROTEINUR, UROBILINOGEN, NITRITE, LEUKOCYTESUR in the last 72 hours.  Invalid input(s): APPERANCEUR   Micro Results: Recent Results (from the past 240 hour(s))  Blood Culture (routine x 2)     Status: None   Collection Time: 11/22/15  8:00 AM  Result Value Ref Range Status   Specimen Description BLOOD RIGHT HAND  Final   Special Requests BOTTLES DRAWN AEROBIC AND ANAEROBIC  5CC  Final   Culture NO GROWTH 5 DAYS  Final   Report Status 11/27/2015 FINAL  Final  Blood Culture (routine x 2)     Status: None   Collection Time: 11/22/15  8:58 AM  Result Value Ref Range Status   Specimen Description BLOOD LEFT ANTECUBITAL  Final   Special Requests BOTTLES DRAWN AEROBIC AND ANAEROBIC 5CC  Final   Culture NO GROWTH 5 DAYS  Final   Report Status 11/27/2015 FINAL  Final  Urine culture     Status: None   Collection Time: 11/22/15  1:30 PM  Result Value Ref Range Status   Specimen Description URINE, CLEAN CATCH  Final   Special Requests NONE  Final   Culture MULTIPLE SPECIES PRESENT, SUGGEST RECOLLECTION  Final   Report Status 11/24/2015 FINAL  Final  Respiratory virus antigens panel     Status: None   Collection Time: 11/22/15  3:32 PM  Result Value Ref Range Status   Respiratory Syncytial Virus A Negative Negative Final   Respiratory Syncytial Virus B Negative Negative Final   Influenza A Negative Negative Final   Influenza B Negative Negative Final   Parainfluenza 1 Negative Negative Final   Parainfluenza 2 Negative Negative Final   Parainfluenza 3 Negative Negative Final   Metapneumovirus Negative Negative Final   Rhinovirus Negative Negative Final   Adenovirus Negative Negative Final    Comment: (NOTE) Performed At: [facility] [address] [place], [place] [id number] Mila Homer MD BD:[id number]   MRSA PCR Screening     Status: None   Collection Time: 11/23/15  2:04 AM  Result Value Ref Range Status   MRSA by PCR NEGATIVE NEGATIVE Final    Comment:        The GeneXpert MRSA Assay (FDA approved for NASAL specimens only), is one component of a comprehensive MRSA colonization surveillance program. It is not intended to diagnose MRSA infection nor to guide or monitor treatment for MRSA infections.   Culture, sputum-assessment     Status: None   Collection Time: 11/25/15  9:58 PM  Result Value Ref Range Status   Specimen  Description SPUTUM  Final   Special Requests NONE  Final   Sputum evaluation   Final    THIS SPECIMEN IS ACCEPTABLE. RESPIRATORY CULTURE REPORT TO FOLLOW.   Report Status 11/25/2015 FINAL  Final  Culture, respiratory (NON-Expectorated)     Status: None   Collection Time: 11/25/15  9:58 PM  Result Value Ref Range Status   Specimen Description SPUTUM  Final   Special Requests NONE  Final   Gram Stain   Final    MODERATE WBC PRESENT, PREDOMINANTLY PMN FEW SQUAMOUS EPITHELIAL CELLS PRESENT RARE GRAM POSITIVE COCCI IN PAIRS Performed at [facility]    Culture   Final    NORMAL  OROPHARYNGEAL FLORA Performed at [facility]    Report Status 11/28/2015 FINAL  Final   Studies/Results: No results found. Medications:  . antiseptic oral rinse  7 mL Mouth Rinse q12n4p  . azelastine  2 spray Each Nare BID  . chlorhexidine  15 mL Mouth Rinse BID  . chlorpheniramine-HYDROcodone  5 mL Oral Q12H  . dextromethorphan-guaiFENesin  1 tablet Oral BID  . diclofenac sodium  4 g Topical QID  . fluticasone  2 spray Each Nare BID  . heparin  5,000 Units Subcutaneous 3 times per day  . ipratropium-albuterol  3 mL Nebulization QID  . pantoprazole  40 mg Oral BID  . predniSONE  50 mg Oral Q breakfast  . sertraline  25 mg Oral Daily   acetaminophen, albuterol, ALPRAZolam, nitroGLYCERIN, ondansetron (ZOFRAN) IV  Assessment/Plan: Active Problems:   Adjustment disorder with mixed anxiety and depressed mood   Psoriasis   Pneumonia   Sepsis (HCC)   PNA (pneumonia)   Community acquired pneumonia   Cough   Hypercapnic respiratory failure, chronic (HCC)   OSA (obstructive sleep apnea)   COPD, severe (HCC)   Immunosuppressed status (HCC)   Goals of care, counseling/discussion   Respiratory failure, acute and chronic (HCC)   Palliative care encounter  COPD exacerbation in setting of immunocompromised status The patient is immunocompromised due to home medications of prednisone 20mg   daily and humira 2x month. She presented with productive cough, chest pain, and worsening shortness of breath. She was found to be febrile and tachycardic. Although CXR revealed very faint patchy RUL infiltrate, a more likely diagnosis is COPD exacerbation. Repeat CXR showed stable, non-worsening infiltrate. The patient has severe COPD with PFT 02/13/2013 showing FEV1 of 49%. Her last hospitalization for COPD was in 2015 and she feels this episode is very similar. Recent sick contacts include her grand daughter who is 77 months old and often has a URI.  Treatments - Continue nasal cannula 3L O2 (this is what she uses at home) - PT/OT - Continue discussions with palliative care (first meeting yesterday) - Continue Prednisone 50 mg daily - Continue duoneb 4xday - Continue Mucinex, flonase, tylenol - Continue voltaren gel 4xday for sore chest from coughing Diagnostics - Sputum culture: moderate PMNs, rare GPC in pairs  Anxiety Home xanax 1 mg TID, continued here. Has a history of intentional overdose with alprazolam in 2014. Anxiety has caused the patient to have breath holding spells and refuse to work with PT this hospitalization - Continue home xanax 1 mg TID - Add zoloft 25 mg daily - Restart home trazodone 50 mg qn - Encouraged again to work with PT, she said she will try today  GERD - Continue Pantoprazole to 40 mg BID  VTE prophylaxis Heparin 5000 units TID  This is a Psychologist, occupational Note.  The care of the patient was discussed with Dr. Danella Penton and the assessment and plan formulated with their assistance.  Please see their attached note for official documentation of the daily encounter.   LOS: 6 days   Rosebud Poles, Med Student 11/28/2015, 11:45 AM

## 2015-11-28 NOTE — Progress Notes (Signed)
Subjective: Pt states her breathing is the same. She is requesting pain medications to help with air hunger that she was told about during her palliative care meeting. She feels anxious when she gets up to move around. She refused to work with PT yesterday.  Objective: Vital signs in last 24 hours: Filed Vitals:   11/27/15 2210 11/28/15 0644 11/28/15 0810 11/28/15 0926  BP: 168/77 160/77 154/75   Pulse: 72 83 87   Temp: 98 F (36.7 C) 98.2 F (36.8 C) 98.9 F (37.2 C)   TempSrc: Oral Oral Oral   Resp: 20 20 18    Height:      Weight:      SpO2: 96% 96% 98% 93%   Weight change:   Intake/Output Summary (Last 24 hours) at 11/28/15 1109 Last data filed at 11/28/15 0600  Gross per 24 hour  Intake    360 ml  Output      0 ml  Net    360 ml   General: NAD, obese, anxious Lungs:distant lung sounds, good air movement, no wheezing  Cardiac: RRR, no murmurs GI: soft, active bowel sounds Neuro: CN II-XII grossly intact Skin: warm and dry  Lab Results: Basic Metabolic Panel:  Recent Labs Lab 11/22/15 0935 11/23/15 0330 11/23/15 0852 11/24/15 0943  NA  --  137  --  136  K  --  4.7  --  4.4  CL  --  94*  --  90*  CO2  --  34*  --  35*  GLUCOSE  --  132*  --  153*  BUN  --  7  --  13  CREATININE  --  0.62  --  0.78  CALCIUM  --  8.4*  --  8.7*  MG 1.3*  --  2.1  --    Liver Function Tests:  Recent Labs Lab 11/22/15 0800  AST 20  ALT 14  ALKPHOS 40  BILITOT 0.2*  PROT 6.3*  ALBUMIN 3.7   CBC:  Recent Labs Lab 11/23/15 0330 11/24/15 0943  WBC 8.4 7.2  HGB 10.7* 11.2*  HCT 36.4 36.2  MCV 95.8 95.3  PLT 221 248   Cardiac Enzymes:  Recent Labs Lab 11/22/15 1654 11/25/15 1130  TROPONINI 0.03 <0.03     Medications: I have reviewed the patient's current medications. Scheduled Meds: . antiseptic oral rinse  7 mL Mouth Rinse q12n4p  . azelastine  2 spray Each Nare BID  . chlorhexidine  15 mL Mouth Rinse BID  . chlorpheniramine-HYDROcodone  5 mL  Oral Q12H  . dextromethorphan-guaiFENesin  1 tablet Oral BID  . diclofenac sodium  4 g Topical QID  . fluticasone  2 spray Each Nare BID  . heparin  5,000 Units Subcutaneous 3 times per day  . ipratropium-albuterol  3 mL Nebulization QID  . pantoprazole  40 mg Oral BID  . predniSONE  50 mg Oral Q breakfast   Continuous Infusions:  PRN Meds:.acetaminophen, albuterol, ALPRAZolam, nitroGLYCERIN, ondansetron (ZOFRAN) IV Assessment/Plan: Active Problems:   Adjustment disorder with mixed anxiety and depressed mood   Psoriasis   Pneumonia   Sepsis (HCC)   PNA (pneumonia)   Community acquired pneumonia   Cough   Hypercapnic respiratory failure, chronic (HCC)   OSA (obstructive sleep apnea)   COPD, severe (HCC)   Immunosuppressed status (HCC)   Goals of care, counseling/discussion   Respiratory failure, acute and chronic (HCC)   Palliative care encounter   Acute COPD exacerbation--slowly improving, pt's main issue  seems to be anxiety. She states will try working with PT today.   - duonebs q4h, albuterol inhaler q2h prn, and albuterol neb q4h prn -  prednisone 50mg  daily  - continue home mucinex DM - oxygen therapy to maintain O2 sats between 88-92% - PCCM following, appreciate their recommendations.  - continue PPI - PT consulted  Anxiety-- hx of intentional benzo overdose in 2014 that did not require intubation. Pt does not want to switch to klonopin b/c it makes her drowsy and did not produce a satisfactory result for her.  - conintue xanax 1mg  TID  - started on zoloft 25mg  daily  Code status-- currently FULL code - palliative care following. Advised patient to discuss risks and benefits of starting opioids in combination with benzos on her respiratory status. It was discussed at length in the room w/ patient and she states "thats a big decision." For now we started her on zoloft.   Dispo: Disposition is deferred at this time, awaiting improvement of current medical  problems.  The patient does have a current PCP (2015, MD) and does not need an [facility] hospital follow-up appointment after discharge.   .Services Needed at time of discharge: Y = Yes, Blank = No PT:   OT:   RN:   Equipment:   Other:     LOS: 6 days   , MD 11/28/2015, 11:09 AM

## 2015-11-28 NOTE — Care Management Important Message (Signed)
Important Message  Patient Details  Name: Wendy [name] MRN: [id number] Date of Birth: 1961-08-03   Medicare Important Message Given:  Yes    Oralia Rud Ferrel [place] 11/28/2015, 12:38 PM

## 2015-11-28 NOTE — Progress Notes (Signed)
PULMONARY / CRITICAL CARE MEDICINE   Name: Wendy [name] MRN: [id number] DOB: 02/12/1961    ADMISSION DATE:  11/22/2015 CONSULTATION DATE: 3/9  REFERRING MD: Tommy Medal   CHIEF COMPLAINT:  SOB - Difficulty to wean off BIPAP  SUBJECTIVE:  Breathing is stable/ a little better today and since admission. Daughter in room.  VITAL SIGNS: BP 154/75 mmHg  Pulse 89  Temp(Src) 98.9 F (37.2 C) (Oral)  Resp 18  Ht 5' 1"  (1.549 m)  Wt 68.947 kg (152 lb)  BMI 28.74 kg/m2  SpO2 98%  INTAKE / OUTPUT: I/O last 3 completed shifts: In: 600 [P.O.:600] Out: 575 [Urine:575]  PHYSICAL EXAMINATION: General: alert, appropriate Neuro: follows commands Cardiac: regular, no murmur Chest: very poor air movement, no wheeze. Unlabored at rest Abd: soft, non tender Ext: no edema Skin: no rashes  LABS:  BMET  Recent Labs Lab 11/22/15 0800 11/23/15 0330 11/24/15 0943  NA 139 137 136  K 3.2* 4.7 4.4  CL 92* 94* 90*  CO2 36* 34* 35*  BUN 6 7 13   CREATININE 0.86 0.62 0.78  GLUCOSE 109* 132* 153*    Electrolytes  Recent Labs Lab 11/22/15 0800 11/22/15 0935 11/23/15 0330 11/23/15 0852 11/24/15 0943  CALCIUM 9.0  --  8.4*  --  8.7*  MG  --  1.3*  --  2.1  --     CBC  Recent Labs Lab 11/22/15 0800 11/23/15 0330 11/24/15 0943  WBC 9.5 8.4 7.2  HGB 11.4* 10.7* 11.2*  HCT 36.5 36.4 36.2  PLT 216 221 248    Coag's  Recent Labs Lab 11/22/15 0800  INR 0.93    Sepsis Markers  Recent Labs Lab 11/22/15 0811 11/22/15 1140 11/22/15 1707 11/24/15 0520 11/26/15 0435  LATICACIDVEN 1.50 0.33*  --   --   --   PROCALCITON  --   --  <0.10 <0.10 <0.10    ABG  Recent Labs Lab 11/23/15 1211 11/23/15 1458 11/23/15 2218  PHART 7.219* 7.260* 7.327*  PCO2ART 95.4* 88.5* 73.9*  PO2ART 73.1* 80.8 87.5    Liver Enzymes  Recent Labs Lab 11/22/15 0800  AST 20  ALT 14  ALKPHOS 40  BILITOT 0.2*  ALBUMIN 3.7    Cardiac Enzymes  Recent Labs Lab 11/22/15 1654  11/25/15 1130  TROPONINI 0.03 <0.03    Glucose No results for input(s): GLUCAP in the last 168 hours.  Imaging No results found.  CXR- faint RUL infilt not present on prior  CT. I reviewed images  STUDIES:  PFT 02/13/13 >> FEV1 0.48 (19%), FEV1% 39, DLCO 46%  CULTURES: 3/7 Blood x 2 >> 3/7 Urine  >> negative 3/7 RVP >> negative  ANTIBIOTICS: Azithromycin 3/7 >>> Azactam 3/7 >>> 3/7 Vancomycin 3/7 >> 3/7  DISCUSSION: 55 yo female former smoker presented with cough, dyspnea, chest pain, fever (Tm 104F) 2nd to pneumonia.  She is followed by Dr. Annamaria Boots for GOLD D COPD on chronic prednisone, chronic hypoxic/hypercapnic respiratory failure.    She has hx of psoriasis on humira, chronic diastolic CHF, depression.  I reviewed CXR myself, significant hyperinflation.  ASSESSMENT / PLAN:  Acute on chronic hypoxic/hypercapnic respiratory failure 2nd to pneumonia. Plan: - Abx course complete. - F/u CXR intermittently - Oxygen to keep SpO2 88 to 92% - D/C BiPAP. - Mobilize as able- sit edge of bed, up in chair  GOLD D COPD. Plan: - Scheduled BDs - Switch to prednisone.  Rhinitis. Plan: - Astelin, flonase  Cough. Plan: - Tussionex, mucinex  GERD. Plan: - PPI.  Goals of care >> 3/10-she is not interested in speaking with palliative care at this time.  She says her family has been informed of her wishes >> should would be okay with intubation if needed, and leave it to her family to decide how long she should stay on the vent. 3/11- I talked with her again today about Palliative Care as a way to help her with conversation so that we can help her follow her wishes. I made some distinction between Palliative Care and a Hospice/ EOL decision. I think she would be receptive if you request Palliative Care consult. 3/12 met with palliative care. 3/13 spoke with patient, willing to speak with them regarding pain control, symptom control and was told very clearly that palliative  is not going to help her function better it is strictly symptom control.  Discussed with PCCM-NP.  PCCM will sign off, please call back if needed.  Rush Farmer, M.D. [facility] Pulmonary/Critical Care Medicine. Pager: [phone]. After hours pager: [phone].

## 2015-11-29 LAB — BLOOD GAS, ARTERIAL
ACID-BASE EXCESS: 15.9 mmol/L — AB (ref 0.0–2.0)
Bicarbonate: 42.8 mEq/L — ABNORMAL HIGH (ref 20.0–24.0)
Drawn by: 249101
O2 Content: 4 L/min
O2 SAT: 94.1 %
PATIENT TEMPERATURE: 98.6
PCO2 ART: 88.9 mmHg — AB (ref 35.0–45.0)
TCO2: 45.6 mmol/L (ref 0–100)
pH, Arterial: 7.304 — ABNORMAL LOW (ref 7.350–7.450)
pO2, Arterial: 84 mmHg (ref 80.0–100.0)

## 2015-11-29 MED ORDER — MORPHINE SULFATE (CONCENTRATE) 10 MG/0.5ML PO SOLN
5.0000 mg | Freq: Four times a day (QID) | ORAL | Status: DC | PRN
  Administered 2015-11-29: 5 mg via ORAL
  Filled 2015-11-29: qty 0.5

## 2015-11-29 MED ORDER — MOMETASONE FURO-FORMOTEROL FUM 200-5 MCG/ACT IN AERO
2.0000 | INHALATION_SPRAY | Freq: Two times a day (BID) | RESPIRATORY_TRACT | Status: DC
  Filled 2015-11-29: qty 8.8

## 2015-11-29 MED ORDER — MORPHINE SULFATE (CONCENTRATE) 10 MG/0.5ML PO SOLN
5.0000 mg | ORAL | Status: DC | PRN
  Administered 2015-11-29 – 2015-11-30 (×3): 5 mg via ORAL
  Filled 2015-11-29 (×3): qty 0.5

## 2015-11-29 MED ORDER — TRAZODONE HCL 50 MG PO TABS
50.0000 mg | ORAL_TABLET | Freq: Every day | ORAL | Status: DC
Start: 2015-11-29 — End: 2015-11-30
  Administered 2015-11-29: 50 mg via ORAL
  Filled 2015-11-29: qty 1

## 2015-11-29 MED ORDER — IPRATROPIUM-ALBUTEROL 0.5-2.5 (3) MG/3ML IN SOLN
3.0000 mL | Freq: Two times a day (BID) | RESPIRATORY_TRACT | Status: DC
Start: 2015-11-29 — End: 2015-11-30
  Administered 2015-11-29 – 2015-11-30 (×2): 3 mL via RESPIRATORY_TRACT
  Filled 2015-11-29 (×2): qty 3

## 2015-11-29 MED ORDER — MORPHINE SULFATE (PF) 2 MG/ML IV SOLN
2.0000 mg | Freq: Once | INTRAVENOUS | Status: AC
  Administered 2015-11-29: 2 mg via INTRAVENOUS
  Filled 2015-11-29: qty 1

## 2015-11-29 NOTE — Care Management Note (Signed)
Case Management Note  Patient Details  Name: Wendy [name] MRN: [id number] Date of Birth: 11-08-1960  Subjective/Objective:         CM following for progression and d/c planning.            Action/Plan: 11/29/2015 Noted pt concern for insurance paying for medicaitons, please be aware that this pt has Medicare and Medicaid, the Medicaid generally covers these meds with a minimal copay.   Expected Discharge Date:                  Expected Discharge Plan:  Home w Home Health Services  In-House Referral:     Discharge planning Services  CM Consult  Post Acute Care Choice:    Choice offered to:     DME Arranged:    DME Agency:     HH Arranged:    HH Agency:     Status of Service:  In process, will continue to follow  Medicare Important Message Given:  Yes Date Medicare IM Given:    Medicare IM give by:    Date Additional Medicare IM Given:    Additional Medicare Important Message give by:     If discussed at Long Length of Stay Meetings, dates discussed:    Additional Comments:  Starlyn Skeans, RN 11/29/2015, 4:08 PM

## 2015-11-29 NOTE — Progress Notes (Signed)
Occupational Therapy Treatment Patient Details Name: Wendy [name] MRN: [id number] DOB: October 01, 1960 Today's Date: 11/29/2015    History of present illness 55 year old Caucasian lady with severe COPD, likely untreated obstructive sleep apnea, psoriasis on tumor necrosis factor antagonist therapy who was admitted with worsening cough with production of yellow sputum over the last week.   OT comments  Pt was seen for OT treatment session today with focus on energy conservation techniques and recommendations for home. Handout was issued and reviewed today. Pt verbalized understanding. Pt sitting up in chair with family in room upon conclusion of session, call bell in reach.   Follow Up Recommendations  No OT follow up;Supervision/Assistance - 24 hour    Equipment Recommendations  3 in 1 bedside comode (For use over toilet and in walk in shower when bathing.)    Recommendations for Other Services      Precautions / Restrictions Precautions Precautions: Other (comment) (SOB) Precaution Comments: O2 via nasal cannula 3.5 L at baseline Restrictions Weight Bearing Restrictions: No       Mobility Bed Mobility               General bed mobility comments: pt up in chair upon OT arrival  Transfers                      Balance                                   ADL Overall ADL's : Needs assistance/impaired                                       General ADL Comments: Pt was seen for pt/family education today re: energy conservation techniques. Pt was sitting up in chair upon OT arrival on 4-5 L O2 via nasal cannula. Review of chart & RN notes indicate that pt was feeling SOB @ 91% stating that she couldn't breathe prior to OT treatment session. Upon OT arrival, pt declined any functional mobility as she had recently been given morphine but was agreeable to education. EC handout was issued and reviewed, discussed set up and use of 3:1 over  toilet at home as well as using in shower. Also discussed use of hand held shower head. Discussed taking rest breaks PRN during ADL's, placing chairs near sink, vanity, in hallway for use PRN/sitting during activity and putting off un-necessary tasks when able (Rollator can be used for this as well). Pt verbalized understanding. Pt was left resting in recliner chair w/ son sitting EOB at conclusion of therapy.       Vision                     Perception     Praxis      Cognition   Behavior During Therapy: WFL for tasks assessed/performed Overall Cognitive Status: Within Functional Limits for tasks assessed                       Extremity/Trunk Assessment               Exercises     Shoulder Instructions       General Comments      Pertinent Vitals/ Pain       Pain Assessment: No/denies pain (Pt states the "  Nurse gave me Morphine a while ago, so I'm feeling better now")  Home Living                                          Prior Functioning/Environment              Frequency Min 2X/week     Progress Toward Goals  OT Goals(current goals can now be found in the care plan section)  Progress towards OT goals: Progressing toward goals  Acute Rehab OT Goals Patient Stated Goal: Go home when able w/ family PRN assist  Plan Discharge plan needs to be updated    Co-evaluation                 End of Session Equipment Utilized During Treatment: Oxygen   Activity Tolerance Patient tolerated treatment well   Patient Left in chair;with call bell/phone within reach;with family/visitor present   Nurse Communication          Time: 1115-5208 OT Time Calculation (min): 9 min  Charges: OT General Charges $OT Visit: 1 Procedure OT Treatments $Self Care/Home Management : 8-22 mins  Ayana Imhof Beth Dixon, OTR/L 11/29/2015, 11:54 AM

## 2015-11-29 NOTE — Progress Notes (Signed)
Palliative Medicine RN Note: Visited patient after she got morphine 2 mg IV this morning. She reports that it did help with her breathing but that she was again starting to have increased work of breathing. At the time of my meeting, she was up in a chair with O2 [place] on. She was holding her thorax up by pushing on the arm of the chair, stating she breathes better in that position. Respirations were objectively shallow. 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 Discussed use of opioids as gold standard treatment for shortness of breath, as well as describing the titration of the morphine (i.e. "low and slow"; not using large doses). Patient and mother verbalized understanding. Also discussed feeling short of breath as an emergency and the use of morphine as a tool to help get her through that crisis so she can get to whatever level of care she chooses to pursue. Patient asked if she should still be on Humira; directed her to discuss that with her attending team and provided paper and pen for her to make a list of questions for them when they round.  Per Dr Laqueta Linden with PMT, the following recommendations are made in case patient's dyspnea remains uncontrolled overnight: Give one time dose of IV morphine 2 mg if patient is severely short of breath Increase prn oral morphine to 10 mg q 4 hours prn  Pt asked about insurance covering morphine at discharge, as well as where she can get liquid morphine. If patient is unable to find liquid morphine, recommend switching to MSIR po at the same dose/schedule that is used to control symptoms while inpt.   Donn Pierini, RN, BSN, [facility] 11/29/2015 3:16 PM Cell [phone] 8:00-4:00 Monday-Friday Office [phone]

## 2015-11-29 NOTE — Progress Notes (Signed)
Subjective: Patient endorses SOB, sitting up in bed. Improved with O2 5L nasal cannula and 2 mg morphine. Then pt became sleepy after the morphine.  Objective: Vital signs in last 24 hours: Filed Vitals:   11/28/15 1956 11/28/15 2118 11/29/15 0524 11/29/15 0749  BP:  177/76 141/73 150/84  Pulse:  82 84 82  Temp:  98.6 F (37 C) 98.6 F (37 C) 98.4 F (36.9 C)  TempSrc:  Oral Oral Oral  Resp:  19 20 18   Height:      Weight:  69.4 kg (153 lb)    SpO2: 94% 95% 96% 93%   Weight change:   Intake/Output Summary (Last 24 hours) at 11/29/15 1122 Last data filed at 11/29/15 1000  Gross per 24 hour  Intake   1080 ml  Output    400 ml  Net    680 ml    Physical Exam General Appearance:    Alert, anxious. Complains of mild SOB.  Head:    Normocephalic, without obvious abnormality, atraumatic  Eyes:    conjunctiva/corneas clear, EOM's intact  Nose:   Nares normal,no rhinorrhe  Throat:   Lips, mucosa, and tongue normal; teeth and gums normal  Back:     Symmetric, no curvature, ROM normal, no CVA tenderness  Lungs:     Clear to auscultation bilaterally, respirations unlabored  Chest Wall:    No tenderness or deformity   Heart:    Regular rate and rhythm, S1 and S2 normal, no murmur, rub   or gallop  Abdomen:     Tender in the epigastric area. Soft, no masses, no organomegaly  Extremities:   Extremities normal, atraumatic, no cyanosis or edema  Skin:   Skin color, texture, turgor normal, no rashes or lesions  Neurologic:   Alert and oriented, normal strength, sensation and reflexes    throughout   Lab Results: Basic Metabolic Panel: No results for input(s): NA, K, CL, CO2, GLUCOSE, BUN, CREATININE, CALCIUM, MG, PHOS in the last 72 hours. Liver Function Tests: No results for input(s): AST, ALT, ALKPHOS, BILITOT, PROT, ALBUMIN in the last 72 hours. No results for input(s): LIPASE, AMYLASE in the last 72 hours. No results for input(s): AMMONIA in the last 72 hours. CBC: No results  for input(s): WBC, NEUTROABS, HGB, HCT, MCV, PLT in the last 72 hours. Cardiac Enzymes: No results for input(s): CKTOTAL, CKMB, CKMBINDEX, TROPONINI in the last 72 hours. BNP: No results for input(s): PROBNP in the last 72 hours. D-Dimer: No results for input(s): DDIMER in the last 72 hours. CBG: No results for input(s): GLUCAP in the last 72 hours. Hemoglobin A1C: No results for input(s): HGBA1C in the last 72 hours. Fasting Lipid Panel: No results for input(s): CHOL, HDL, LDLCALC, TRIG, CHOLHDL, LDLDIRECT in the last 72 hours. Thyroid Function Tests: No results for input(s): TSH, T4TOTAL, FREET4, T3FREE, THYROIDAB in the last 72 hours. Anemia Panel: No results for input(s): VITAMINB12, FOLATE, FERRITIN, TIBC, IRON, RETICCTPCT in the last 72 hours. Coagulation: No results for input(s): LABPROT, INR in the last 72 hours. Urine Drug Screen: Drugs of Abuse     Component Value Date/Time   LABOPIA NONE DETECTED 11/27/2012 0017   COCAINSCRNUR NONE DETECTED 11/27/2012 0017   LABBENZ POSITIVE* 11/27/2012 0017   AMPHETMU NONE DETECTED 11/27/2012 0017   THCU NONE DETECTED 11/27/2012 0017   LABBARB NONE DETECTED 11/27/2012 0017    Alcohol Level: No results for input(s): ETH in the last 72 hours. Urinalysis: No results for input(s): COLORURINE, LABSPEC,  PHURINE, GLUCOSEU, HGBUR, BILIRUBINUR, KETONESUR, PROTEINUR, UROBILINOGEN, NITRITE, LEUKOCYTESUR in the last 72 hours.  Invalid input(s): APPERANCEUR   Micro Results: Recent Results (from the past 240 hour(s))  Blood Culture (routine x 2)     Status: None   Collection Time: 11/22/15  8:00 AM  Result Value Ref Range Status   Specimen Description BLOOD RIGHT HAND  Final   Special Requests BOTTLES DRAWN AEROBIC AND ANAEROBIC 5CC  Final   Culture NO GROWTH 5 DAYS  Final   Report Status 11/27/2015 FINAL  Final  Blood Culture (routine x 2)     Status: None   Collection Time: 11/22/15  8:58 AM  Result Value Ref Range Status   Specimen  Description BLOOD LEFT ANTECUBITAL  Final   Special Requests BOTTLES DRAWN AEROBIC AND ANAEROBIC 5CC  Final   Culture NO GROWTH 5 DAYS  Final   Report Status 11/27/2015 FINAL  Final  Urine culture     Status: None   Collection Time: 11/22/15  1:30 PM  Result Value Ref Range Status   Specimen Description URINE, CLEAN CATCH  Final   Special Requests NONE  Final   Culture MULTIPLE SPECIES PRESENT, SUGGEST RECOLLECTION  Final   Report Status 11/24/2015 FINAL  Final  Respiratory virus antigens panel     Status: None   Collection Time: 11/22/15  3:32 PM  Result Value Ref Range Status   Respiratory Syncytial Virus A Negative Negative Final   Respiratory Syncytial Virus B Negative Negative Final   Influenza A Negative Negative Final   Influenza B Negative Negative Final   Parainfluenza 1 Negative Negative Final   Parainfluenza 2 Negative Negative Final   Parainfluenza 3 Negative Negative Final   Metapneumovirus Negative Negative Final   Rhinovirus Negative Negative Final   Adenovirus Negative Negative Final    Comment: (NOTE) Performed At: [facility] [address] [place], [place] [id number] Mila Homer MD WC:[id number]   MRSA PCR Screening     Status: None   Collection Time: 11/23/15  2:04 AM  Result Value Ref Range Status   MRSA by PCR NEGATIVE NEGATIVE Final    Comment:        The GeneXpert MRSA Assay (FDA approved for NASAL specimens only), is one component of a comprehensive MRSA colonization surveillance program. It is not intended to diagnose MRSA infection nor to guide or monitor treatment for MRSA infections.   Culture, sputum-assessment     Status: None   Collection Time: 11/25/15  9:58 PM  Result Value Ref Range Status   Specimen Description SPUTUM  Final   Special Requests NONE  Final   Sputum evaluation   Final    THIS SPECIMEN IS ACCEPTABLE. RESPIRATORY CULTURE REPORT TO FOLLOW.   Report Status 11/25/2015 FINAL  Final  Culture, respiratory  (NON-Expectorated)     Status: None   Collection Time: 11/25/15  9:58 PM  Result Value Ref Range Status   Specimen Description SPUTUM  Final   Special Requests NONE  Final   Gram Stain   Final    MODERATE WBC PRESENT, PREDOMINANTLY PMN FEW SQUAMOUS EPITHELIAL CELLS PRESENT RARE GRAM POSITIVE COCCI IN PAIRS Performed at [facility]    Culture   Final    NORMAL OROPHARYNGEAL FLORA Performed at [facility]    Report Status 11/28/2015 FINAL  Final   Studies/Results: No results found. Medications:  . antiseptic oral rinse  7 mL Mouth Rinse q12n4p  . azelastine  2 spray Each  Nare BID  . chlorhexidine  15 mL Mouth Rinse BID  . chlorpheniramine-HYDROcodone  5 mL Oral Q12H  . dextromethorphan-guaiFENesin  1 tablet Oral BID  . diclofenac sodium  4 g Topical QID  . fluticasone  2 spray Each Nare BID  . heparin  5,000 Units Subcutaneous 3 times per day  . ipratropium-albuterol  3 mL Nebulization BID  . mometasone-formoterol  2 puff Inhalation BID  . pantoprazole  40 mg Oral BID  . predniSONE  50 mg Oral Q breakfast  . sertraline  25 mg Oral Daily  . traZODone  50 mg Oral QHS   acetaminophen, albuterol, ALPRAZolam, nitroGLYCERIN, ondansetron (ZOFRAN) IV  Assessment/Plan: Active Problems:   Adjustment disorder with mixed anxiety and depressed mood   Psoriasis   Pneumonia   Sepsis (HCC)   PNA (pneumonia)   Community acquired pneumonia   Cough   Hypercapnic respiratory failure, chronic (HCC)   OSA (obstructive sleep apnea)   COPD, severe (HCC)   Immunosuppressed status (HCC)   Goals of care, counseling/discussion   Respiratory failure, acute and chronic (HCC)   Palliative care encounter   Dyspnea . Marland Kitchen COPD exacerbation in setting of immunocompromised status The patient is immunocompromised due to home medications of prednisone 20mg  daily and humira 2x month. She presented with productive cough, chest pain, and worsening shortness of breath. She was found to  be febrile and tachycardic. Although CXR revealed very faint patchy RUL infiltrate, a more likely diagnosis is COPD exacerbation. Repeat CXR showed stable, non-worsening infiltrate. The patient has severe COPD with PFT 02/13/2013 showing FEV1 of 49%. Her last hospitalization for COPD was in 2015 and she feels this episode is very similar. Recent sick contacts include her grand daughter who is 51 months old and often has a URI.  Updates Patient no longer requiring bipap but has periodic episodes of SOB. Was inquiring about morphine and given 2 mg today as a test dose to help with air hunger. Became sleepy after the morphine.  Treatments - Continue nasal cannula O2 (this is what she uses at home) - Continue PT/OT- worked w PT yesterday on 4L O2, PT recommended 24 hour supervision and OT recommended intermittent supervision - Continue discussions with palliative care  - Continue Prednisone 50 mg daily - Continue duoneb 4xday - Continue Mucinex, flonase, tylenol - Continue voltaren gel 4xday for sore chest from coughing Diagnostics - Sputum culture: moderate PMNs, rare GPC in pairs  Anxiety Home xanax 1 mg TID, continued here. Has a history of intentional overdose with alprazolam in 2014. Anxiety has caused the patient to have breath holding spells and refuse to work with PT this hospitalization - Continue home xanax 1 mg TID - Add zoloft 25 mg daily - Restart home trazodone 50 mg qn - Trial of morphine 2 mg today, pt became sleepy. We will have to very carefully balance this patient's discomfort/air hunger with her home xanax and hx of overdose. Given how sleepy she became today it is unlikely that morphine is the right option for her. Will talk with palliative.  GERD - Continue Pantoprazole to 40 mg BID  VTE prophylaxis Heparin 5000 units TID  This is a 2015 Note.  The care of the patient was discussed with Dr. Psychologist, occupational and the assessment and plan formulated with their assistance.   Please see their attached note for official documentation of the daily encounter.   LOS: 7 days   Danella Penton, Med Student 11/29/2015, 11:22 AM

## 2015-11-29 NOTE — Progress Notes (Signed)
Pt noted to be in respiratory distress, short of breath, 02 91% on 3L, administered morphine, pt refuses PRN breathing treatment, notified MD. Increased 02 to 4L.. Stayed with pt. No new orders.

## 2015-11-29 NOTE — Progress Notes (Addendum)
CRITICAL VALUE ALERT  Critical value received: C02 88.9  Date of notification:  11/29/2015  Time of notification:  1:14 PM  Critical value read back:Yes.    Nurse who received alert:  Riesa Pope   MD aware, Rosebud Poles

## 2015-11-30 ENCOUNTER — Telehealth: Payer: Self-pay | Admitting: Internal Medicine

## 2015-11-30 MED ORDER — HYDROCOD POLST-CPM POLST ER 10-8 MG/5ML PO SUER: 5.0000 mL | Freq: Two times a day (BID) | ORAL | Status: DC

## 2015-11-30 MED ORDER — MORPHINE SULFATE (CONCENTRATE) 10 MG/0.5ML PO SOLN: 5.0000 mg | ORAL | Status: DC | PRN

## 2015-11-30 MED ORDER — ALPRAZOLAM 0.5 MG PO TABS
1.0000 mg | ORAL_TABLET | Freq: Once | ORAL | Status: AC
  Administered 2015-11-30: 1 mg via ORAL
  Filled 2015-11-30: qty 2

## 2015-11-30 MED ORDER — PREDNISONE 50 MG PO TABS: 50.0000 mg | ORAL_TABLET | Freq: Every day | ORAL | Status: DC

## 2015-11-30 NOTE — Progress Notes (Signed)
Subjective: Denies any complaints this morning. Both her and her daughter state they can have someone in the house with her 24/7.   Objective: Vital signs in last 24 hours: Filed Vitals:   11/29/15 2000 11/29/15 2003 11/30/15 0513 11/30/15 0754  BP: 149/94  127/68 114/64  Pulse: 83  90 84  Temp: 98.5 F (36.9 C)  98.2 F (36.8 C) 98.2 F (36.8 C)  TempSrc: Oral  Oral Oral  Resp: 18  19 18   Height:      Weight: 154 lb 8.7 oz (70.1 kg)     SpO2: 96% 94% 94% 96%   Weight change: 1 lb 8.7 oz (0.7 kg)  Intake/Output Summary (Last 24 hours) at 11/30/15 0840 Last data filed at 11/30/15 0631  Gross per 24 hour  Intake    720 ml  Output      0 ml  Net    720 ml   General: NAD, obese Lungs:distant lung sounds, no wheezing  Cardiac: RRR, no murmurs GI: soft, active bowel sounds Neuro: CN II-XII grossly intact Skin: warm and dry  Lab Results: Basic Metabolic Panel:  Recent Labs Lab 11/23/15 0852 11/24/15 0943  NA  --  136  K  --  4.4  CL  --  90*  CO2  --  35*  GLUCOSE  --  153*  BUN  --  13  CREATININE  --  0.78  CALCIUM  --  8.7*  MG 2.1  --    CBC:  Recent Labs Lab 11/24/15 0943  WBC 7.2  HGB 11.2*  HCT 36.2  MCV 95.3  PLT 248   Cardiac Enzymes:  Recent Labs Lab 11/25/15 1130  TROPONINI <0.03     Medications: I have reviewed the patient's current medications. Scheduled Meds: . antiseptic oral rinse  7 mL Mouth Rinse q12n4p  . azelastine  2 spray Each Nare BID  . chlorhexidine  15 mL Mouth Rinse BID  . chlorpheniramine-HYDROcodone  5 mL Oral Q12H  . dextromethorphan-guaiFENesin  1 tablet Oral BID  . diclofenac sodium  4 g Topical QID  . fluticasone  2 spray Each Nare BID  . heparin  5,000 Units Subcutaneous 3 times per day  . ipratropium-albuterol  3 mL Nebulization BID  . mometasone-formoterol  2 puff Inhalation BID  . pantoprazole  40 mg Oral BID  . predniSONE  50 mg Oral Q breakfast  . sertraline  25 mg Oral Daily  . traZODone  50 mg  Oral QHS   Continuous Infusions:  PRN Meds:.acetaminophen, albuterol, ALPRAZolam, morphine CONCENTRATE, nitroGLYCERIN, ondansetron (ZOFRAN) IV Assessment/Plan: Active Problems:   Adjustment disorder with mixed anxiety and depressed mood   Psoriasis   Pneumonia   Sepsis (HCC)   PNA (pneumonia)   Community acquired pneumonia   Cough   Hypercapnic respiratory failure, chronic (HCC)   OSA (obstructive sleep apnea)   COPD, severe (HCC)   Immunosuppressed status (HCC)   Goals of care, counseling/discussion   Respiratory failure, acute and chronic (HCC)   Palliative care encounter   Dyspnea    Acute COPD exacerbation--resolved  - duonebs BID, albuterol inhaler q2h prn, and albuterol neb q4h prn -  prednisone 50mg  daily x 6 more days, then will resume her home dose of prednisone.  - mucinex and tussionex - oxygen therapy to maintain O2 sats between 88-92% - PCCM following signed off, will have her follow up outpatient w/ Dr. 01/25/16  - continue PPI - continuing morphine for air hunger,  prescribed morphine po 5mg  q4h on discharge.   Anxiety-- hx of intentional benzo overdose in 2014 that did not require intubation. Pt does not want to switch to klonopin b/c it makes her drowsy and did not produce a satisfactory result for her. She was started on zoloft but has been refusing it. She feels that it causes her SOB.  - conintue xanax 1mg  TID    Code status-- FULL code   Dispo: d/c home today.   The patient does have a current PCP (2015, MD) and does not need an [facility] hospital follow-up appointment after discharge.   .Services Needed at time of discharge: Y = Yes, Blank = No PT:  24/7 supervision.   OT:   RN:   Equipment:   Other:     LOS: 8 days   Dessa Phi, MD 11/30/2015, 8:40 AM

## 2015-11-30 NOTE — Progress Notes (Signed)
Palliative Medicine RN Note: Saw pt to f/u symptoms. Daughter in the room and participating in planning and discussions. She reports that she is breathing much better and was actually able to sleep last night for the first time in "a while." Began discussion about titrating benzo/morphine for symptoms, but pt states she is going home today. Discussed care at home; she is not interested at all in hospice, but she did states that her goal is to stay home while feeling as good as possible. She is very interested in [facility] following for outpt symptom mgmt, and she will likely need HH as well. Pt states she was told by admitting team that Dr Maple Hudson would be working with outpt palliative care to manage symptoms.   Referral made to [facility] and [facility] ([facility] info was sent by PMT team). Discussed with pt the availability of PMT if she is readmitted; she verbalized understanding. No med changes made by PMT due to imminent d/c.   Donn Pierini, RN, BSN, [facility] 11/30/2015 11:11 AM Cell [phone] 8:00-4:00 Monday-Friday Office [phone]

## 2015-11-30 NOTE — Progress Notes (Signed)
   11/30/15 1000  Clinical Encounter Type  Visited With Patient and family together  Visit Type Initial  Stress Factors  Patient Stress Factors Health changes  Patient and daughter talked about her discharge and indicated that although patient has some anxiety about going home, knowing that her daughter will be able to help reduces it. Suggested ways she might cope with her anxiety, but she is taking anti-anxiety medication.

## 2015-11-30 NOTE — Progress Notes (Signed)
Volunteers came to take patient out.  Patient became extremely SOB while getting into the wheelchair.  Patient and family requesting to see the MD.  I asked the patient if she wanted me to help her back into the bed while we wait for the MD and the daughter said "No, this is fu... rediclious.  She is going to sit right here until the doctor comes".  MD paged.

## 2015-11-30 NOTE — Care Management Note (Signed)
Case Management Note  Patient Details  Name: Wendy [name] MRN: [id number] Date of Birth: 25-Dec-1960  Subjective/Objective:      CM following for progression and d/c planning.              Action/Plan:  11/30/2015 Met with pt, pt daughter and pt mother re d/c needs. Family states that DME has arrived and has been taken home by family. Pt selected [facility] for [facility] and HHPT services.  [place] notified.  Expected Discharge Date:     11/30/2015             Expected Discharge Plan:  [place]  In-House Referral:     Discharge planning Services  CM Consult  Post Acute Care Choice:  Home Health, Durable Medical Equipment Choice offered to:  Patient  DME Arranged:  Walker rolling with seat, 3-N-1 DME Agency:  [place]:  RN, PT [facility] Agency:  [place]  Status of Service:  Completed, signed off  Medicare Important Message Given:  Yes Date Medicare IM Given:    Medicare IM give by:    Date Additional Medicare IM Given:    Additional Medicare Important Message give by:     If discussed at [place] of Stay Meetings, dates discussed:    Additional Comments:  Adron Bene, RN 11/30/2015, 12:06 PM

## 2015-11-30 NOTE — Discharge Instructions (Signed)
Take prednisone 50mg  once daily for 6 more days starting tomorrow. Then resume your normal dose of prednisone.   I have prescribed you morphine for your air hunger. As we have discussed numerous times, this can decrease your respiratory drive. It is very important to take it only as directed.   Morphine oral solution What is this medicine? MORPHINE (MOR feen) is a pain reliever. It is used to treat moderate to severe pain. This medicine may be used for other purposes; ask your health care provider or pharmacist if you have questions. What should I tell my health care provider before I take this medicine? They need to know if you have any of these conditions: -brain tumor -drug abuse or addiction -head injury -heart disease -if you frequently drink alcohol-containing drinks -intestinal disease -kidney disease or problems urinating -kyphoscoliosis -liver disease -lung disease, asthma, or breathing problems -taken isocarboxazid, phenelzine, tranylcypromine, or selegiline in the past 2 weeks -seizures -an unusual or allergic reaction to morphine, other medicines, foods, dyes, or preservatives -pregnant or trying to get pregnant -breast-feeding How should I use this medicine? Take this medicine by mouth. Follow the directions on the prescription label. Use a specially marked spoon or container to measure each dose. Ask your pharmacist if you do not have one. Household spoons are not accurate. If the medicine upsets your stomach, take it with food or milk. Take your medicine at regular intervals. Do not take it more often than directed. Do not stop taking except on your doctor's advice. A special MedGuide will be given to you by the pharmacist with each prescription and refill. Be sure to read this information carefully each time. Talk to your pediatrician regarding the use of this medicine in children. Special care may be needed. Overdosage: If you think you have taken too much of this  medicine contact a poison control center or emergency room at once. NOTE: This medicine is only for you. Do not share this medicine with others. What if I miss a dose? If you miss a dose, take it as soon as you can. If it is almost time for your next dose, take only that dose. Do not take double or extra doses. What may interact with this medicine? Do not take this medicine with any of the following medications: -MAOIs like Carbex, Eldepryl, Marplan, Nardil, and Parnate This medicine may also interact with the following medications: -alcohol -antihistamines -barbiturates, like phenobarbital -medicines for depression, anxiety, or psychotic disturbances -medicines for sleep -muscle relaxants -naltrexone, naloxone -narcotic medicines (opiates) for pain -rifampin -tramadol This list may not describe all possible interactions. Give your health care provider a list of all the medicines, herbs, non-prescription drugs, or dietary supplements you use. Also tell them if you smoke, drink alcohol, or use illegal drugs. Some items may interact with your medicine. What should I watch for while using this medicine? Tell your doctor or health care professional if your pain does not go away, if it gets worse, or if you have new or a different type of pain. You may develop tolerance to the medicine. Tolerance means that you will need a higher dose of the medicine for pain relief. Tolerance is normal and is expected if you take this medicine for a long time. Do not suddenly stop taking your medicine because you may develop a severe reaction. Your body becomes used to the medicine. This does NOT mean you are addicted. Addiction is a behavior related to getting and using a drug for a  non-medical reason. If you have pain, you have a medical reason to take pain medicine. Your doctor will tell you how much medicine to take. If your doctor wants you to stop the medicine, the dose will be slowly lowered over time to  avoid any side effects. You may get drowsy or dizzy when you first start taking the medicine or change doses. Do not drive, use machinery, or do anything that may be dangerous until you know how the medicine affects you. Stand or sit up slowly. There are different types of narcotic medicines (opiates) for pain. If you take more than one type at the same time, you may have more side effects. Give your health care provider a list of all medicines you use. Your doctor will tell you how much medicine to take. Do not take more medicine than directed. Call emergency for help if you have problems breathing. This medicine will cause constipation. Try to have a bowel movement at least every 2 to 3 days. If you do not have a bowel movement for 3 days, call your doctor or health care professional. Your mouth may get dry. Drinking water, chewing sugarless gum, or sucking on hard candy may help. See your dentist every 6 months. What side effects may I notice from receiving this medicine? Side effects that you should report to your doctor or health care professional as soon as possible: -allergic reactions like skin rash, itching or hives, swelling of the face, lips, or tongue -breathing problems -change in the amount of urine -confusion -feeling faint or lightheaded -fever, chills -hallucinations -seizures -slow or fast heartbeat -unusually weak or tired Side effects that usually do not require medical attention (report to your doctor or health care professional if they continue or are bothersome): -constipation -dizziness -headache -nausea, vomiting -pinpoint pupils -sweating This list may not describe all possible side effects. Call your doctor for medical advice about side effects. You may report side effects to FDA at 1-800-FDA-1088. Where should I keep my medicine? Keep out of the reach of children. This medicine can be abused. Keep your medicine in a safe place to protect it from theft. Do not  share this medicine with anyone. Selling or giving away this medicine is dangerous and is against the law. Store at room temperature between 15 and 30 degrees C (59 and 86 degrees F). Keep container tightly closed. This medicine may cause accidental overdose and death if it is taken by other adults, children, or pets. Flush any unused medicine down the toilet to reduce the chance of harm. Do not use the medicine after the expiration date. NOTE: This sheet is a summary. It may not cover all possible information. If you have questions about this medicine, talk to your doctor, pharmacist, or health care provider.    2016, Elsevier/Gold Standard. (2013-04-07 12:27:54)

## 2015-11-30 NOTE — Progress Notes (Signed)
Subjective: Wendy [name] is amenable to going home today. She appears more relaxed and comfortable this AM after talking with palliative care and being prescribed morphine 5 mg po prn.  Objective: Vital signs in last 24 hours: Filed Vitals:   11/29/15 2000 11/29/15 2003 11/30/15 0513 11/30/15 0754  BP: 149/94  127/68 114/64  Pulse: 83  90 84  Temp: 98.5 F (36.9 C)  98.2 F (36.8 C) 98.2 F (36.8 C)  TempSrc: Oral  Oral Oral  Resp: 18  19 18   Height:      Weight: 70.1 kg (154 lb 8.7 oz)     SpO2: 96% 94% 94% 96%   Weight change: 0.7 kg (1 lb 8.7 oz)  Intake/Output Summary (Last 24 hours) at 11/30/15 1035 Last data filed at 11/30/15 1005  Gross per 24 hour  Intake    720 ml  Output      0 ml  Net    720 ml    Physical Exam General Appearance:    Alert, cooperative, no distress  Head:    Normocephalic, without obvious abnormality, atraumatic  Eyes:    conjunctiva/corneas clear, EOM's intact  Nose:   Nares normal,no rhinorrhe  Back:     Symmetric, no curvature, ROM normal, no CVA tenderness  Lungs:     Clear to auscultation bilaterally, respirations unlabored  Chest Wall:   Non tender, no lesions    Heart:    Regular rate and rhythm, S1 and S2 normal, no murmur, rub   or gallop  Abdomen:     Soft, distended.  Mildly tender to palpation in the epigastric area, RUQ, LUQ. No masses, no organomegaly  Extremities:   Extremities normal, atraumatic, no cyanosis or edema  Skin:   Skin color, texture, turgor normal, no rashes or lesions  Neurologic:   Alert and oriented   Lab Results: Basic Metabolic Panel: No results for input(s): NA, K, CL, CO2, GLUCOSE, BUN, CREATININE, CALCIUM, MG, PHOS in the last 72 hours. Liver Function Tests: No results for input(s): AST, ALT, ALKPHOS, BILITOT, PROT, ALBUMIN in the last 72 hours. No results for input(s): LIPASE, AMYLASE in the last 72 hours. No results for input(s): AMMONIA in the last 72 hours. CBC: No results for input(s): WBC,  NEUTROABS, HGB, HCT, MCV, PLT in the last 72 hours. Cardiac Enzymes: No results for input(s): CKTOTAL, CKMB, CKMBINDEX, TROPONINI in the last 72 hours. BNP: No results for input(s): PROBNP in the last 72 hours. D-Dimer: No results for input(s): DDIMER in the last 72 hours. CBG: No results for input(s): GLUCAP in the last 72 hours. Hemoglobin A1C: No results for input(s): HGBA1C in the last 72 hours. Fasting Lipid Panel: No results for input(s): CHOL, HDL, LDLCALC, TRIG, CHOLHDL, LDLDIRECT in the last 72 hours. Thyroid Function Tests: No results for input(s): TSH, T4TOTAL, FREET4, T3FREE, THYROIDAB in the last 72 hours. Anemia Panel: No results for input(s): VITAMINB12, FOLATE, FERRITIN, TIBC, IRON, RETICCTPCT in the last 72 hours. Coagulation: No results for input(s): LABPROT, INR in the last 72 hours. Urine Drug Screen: Drugs of Abuse     Component Value Date/Time   LABOPIA NONE DETECTED 11/27/2012 0017   COCAINSCRNUR NONE DETECTED 11/27/2012 0017   LABBENZ POSITIVE* 11/27/2012 0017   AMPHETMU NONE DETECTED 11/27/2012 0017   THCU NONE DETECTED 11/27/2012 0017   LABBARB NONE DETECTED 11/27/2012 0017    Alcohol Level: No results for input(s): ETH in the last 72 hours. Urinalysis: No results for input(s): COLORURINE, LABSPEC, PHURINE, GLUCOSEU,  HGBUR, BILIRUBINUR, KETONESUR, PROTEINUR, UROBILINOGEN, NITRITE, LEUKOCYTESUR in the last 72 hours.  Invalid input(s): APPERANCEUR   Micro Results: Recent Results (from the past 240 hour(s))  Blood Culture (routine x 2)     Status: None   Collection Time: 11/22/15  8:00 AM  Result Value Ref Range Status   Specimen Description BLOOD RIGHT HAND  Final   Special Requests BOTTLES DRAWN AEROBIC AND ANAEROBIC 5CC  Final   Culture NO GROWTH 5 DAYS  Final   Report Status 11/27/2015 FINAL  Final  Blood Culture (routine x 2)     Status: None   Collection Time: 11/22/15  8:58 AM  Result Value Ref Range Status   Specimen Description BLOOD  LEFT ANTECUBITAL  Final   Special Requests BOTTLES DRAWN AEROBIC AND ANAEROBIC 5CC  Final   Culture NO GROWTH 5 DAYS  Final   Report Status 11/27/2015 FINAL  Final  Urine culture     Status: None   Collection Time: 11/22/15  1:30 PM  Result Value Ref Range Status   Specimen Description URINE, CLEAN CATCH  Final   Special Requests NONE  Final   Culture MULTIPLE SPECIES PRESENT, SUGGEST RECOLLECTION  Final   Report Status 11/24/2015 FINAL  Final  Respiratory virus antigens panel     Status: None   Collection Time: 11/22/15  3:32 PM  Result Value Ref Range Status   Respiratory Syncytial Virus A Negative Negative Final   Respiratory Syncytial Virus B Negative Negative Final   Influenza A Negative Negative Final   Influenza B Negative Negative Final   Parainfluenza 1 Negative Negative Final   Parainfluenza 2 Negative Negative Final   Parainfluenza 3 Negative Negative Final   Metapneumovirus Negative Negative Final   Rhinovirus Negative Negative Final   Adenovirus Negative Negative Final    Comment: (NOTE) Performed At: [facility] [address] [place], [place] [id number] Mila Homer MD WU:[id number]   MRSA PCR Screening     Status: None   Collection Time: 11/23/15  2:04 AM  Result Value Ref Range Status   MRSA by PCR NEGATIVE NEGATIVE Final    Comment:        The GeneXpert MRSA Assay (FDA approved for NASAL specimens only), is one component of a comprehensive MRSA colonization surveillance program. It is not intended to diagnose MRSA infection nor to guide or monitor treatment for MRSA infections.   Culture, sputum-assessment     Status: None   Collection Time: 11/25/15  9:58 PM  Result Value Ref Range Status   Specimen Description SPUTUM  Final   Special Requests NONE  Final   Sputum evaluation   Final    THIS SPECIMEN IS ACCEPTABLE. RESPIRATORY CULTURE REPORT TO FOLLOW.   Report Status 11/25/2015 FINAL  Final  Culture, respiratory (NON-Expectorated)      Status: None   Collection Time: 11/25/15  9:58 PM  Result Value Ref Range Status   Specimen Description SPUTUM  Final   Special Requests NONE  Final   Gram Stain   Final    MODERATE WBC PRESENT, PREDOMINANTLY PMN FEW SQUAMOUS EPITHELIAL CELLS PRESENT RARE GRAM POSITIVE COCCI IN PAIRS Performed at [facility]    Culture   Final    NORMAL OROPHARYNGEAL FLORA Performed at [facility]    Report Status 11/28/2015 FINAL  Final   Studies/Results: No results found. Medications:  . antiseptic oral rinse  7 mL Mouth Rinse q12n4p  . azelastine  2 spray Each Nare BID  .  chlorhexidine  15 mL Mouth Rinse BID  . chlorpheniramine-HYDROcodone  5 mL Oral Q12H  . dextromethorphan-guaiFENesin  1 tablet Oral BID  . diclofenac sodium  4 g Topical QID  . fluticasone  2 spray Each Nare BID  . heparin  5,000 Units Subcutaneous 3 times per day  . ipratropium-albuterol  3 mL Nebulization BID  . mometasone-formoterol  2 puff Inhalation BID  . pantoprazole  40 mg Oral BID  . predniSONE  50 mg Oral Q breakfast  . sertraline  25 mg Oral Daily  . traZODone  50 mg Oral QHS   acetaminophen, albuterol, ALPRAZolam, morphine CONCENTRATE, nitroGLYCERIN, ondansetron (ZOFRAN) IV  Assessment/Plan: Active Problems:   Adjustment disorder with mixed anxiety and depressed mood   Psoriasis   Pneumonia   Sepsis (HCC)   PNA (pneumonia)   Community acquired pneumonia   Cough   Hypercapnic respiratory failure, chronic (HCC)   OSA (obstructive sleep apnea)   COPD, severe (HCC)   Immunosuppressed status (HCC)   Goals of care, counseling/discussion   Respiratory failure, acute and chronic (HCC)   Palliative care encounter   Dyspnea . Marland Kitchen COPD exacerbation in setting of immunocompromised status he patient has severe COPD with PFT 02/13/2013 showing FEV1 of 49%. She is immunocompromised due to home medications of prednisone 20mg  daily and humira 2x month. She presented with productive cough, chest  pain, worsening shortness of breath, fever, and tachycardia. Although CXR revealed very faint patchy RUL infiltrate, a more likely diagnosis is COPD exacerbation. Repeat CXR showed stable, non-worsening infiltrate. She was on bipap prn for several days after which she was able to transition to nasal cannula oxygen. She has worked with PT and OT and done well, although she has not returned fully to her pre-hospitalization baseline and likely will never return fully to that baseline. She suffered from several episodes of shortness of breath which are likely due to anxiety and air hunger. SHe has found that her xanax and morphine help greatly with these episodes. - Discharge to home today. She has family that can be with her 24/7.  - Close follow up with Dr. and palliative care - Continue Prednisone 50 mg daily for 6 more days, then return to taking home dose of 20 mg daily  - Continue Roxanol 5 mg po prn for severe air hunger - Continue home respiratory medications (advair, spiriva) - Take Humira next week rather than today  Anxiety The patient has found great relief with morphine. 2 mg morphine IV appeared to be a bit much to her and made her very sleepy, however the 5 mg po is a less intense, slower acting medication recommended by palliative care that she has done very well with. Zoloft 25 mg daily was also initiated here in the hospital but the patient likely will nto feel the benefits of this medication until she has been on it for at least several weeks. - Continue roxinol 5 mg po prn for severe SOB - Continue home xanax 1 mg TID - Continue home trazodone 50 mg nightly - Continue zoloft 25 mg daily  GERD - Continue home protonix at discharge  VTE prophylaxis Heparin 5000 units TID  This is a [facility] Note.  The care of the patient was discussed with Dr. Psychologist, occupational and the assessment and plan formulated with their assistance.  Please see their attached note for official  documentation of the daily encounter.   LOS: 8 days   Danella Penton, Med Student 11/30/2015, 10:35 AM

## 2015-11-30 NOTE — Progress Notes (Signed)
Discharge instructions and medications discussed with patient.  Prescriptions given to patient.  All questions answered.  

## 2015-12-01 DIAGNOSIS — J189 Pneumonia, unspecified organism: Secondary | ICD-10-CM | POA: Diagnosis not present

## 2015-12-01 DIAGNOSIS — I509 Heart failure, unspecified: Secondary | ICD-10-CM | POA: Diagnosis not present

## 2015-12-01 DIAGNOSIS — J441 Chronic obstructive pulmonary disease with (acute) exacerbation: Secondary | ICD-10-CM | POA: Diagnosis not present

## 2015-12-01 DIAGNOSIS — Z9981 Dependence on supplemental oxygen: Secondary | ICD-10-CM | POA: Diagnosis not present

## 2015-12-01 NOTE — Telephone Encounter (Signed)
Error

## 2015-12-02 ENCOUNTER — Ambulatory Visit: Payer: Medicare Other | Admitting: Internal Medicine

## 2015-12-02 ENCOUNTER — Inpatient Hospital Stay: Payer: Medicare Other

## 2015-12-02 NOTE — Discharge Summary (Signed)
Name: Wendy [name] MRN: [id number] DOB: November 27, 1960 55 y.o. PCP: Boykin Nearing, MD  Date of Admission: 11/22/2015  7:35 AM Date of Discharge: 11/30/2015 Attending Physician: Dr. Tommy Medal  Discharge Diagnosis:  Active Problems:   Adjustment disorder with mixed anxiety and depressed mood   Psoriasis   Pneumonia   Sepsis ([place])   PNA (pneumonia)   Community acquired pneumonia   Cough   Hypercapnic respiratory failure, chronic (HCC)   OSA (obstructive sleep apnea)   COPD, severe ([place])   Immunosuppressed status ([place])   Goals of care, counseling/discussion   Respiratory failure, acute and chronic ([place])   Palliative care encounter   Dyspnea  Discharge Medications:   Medication List    STOP taking these medications        furosemide 20 MG tablet  Commonly known as:  LASIX      TAKE these medications        Adalimumab 40 MG/0.8ML Pnkt  Commonly known as:  HUMIRA PEN-CROHNS STARTER  Inject 40 mg into the skin every 14 (fourteen) days.     ADVAIR DISKUS 500-50 MCG/DOSE Aepb  Generic drug:  Fluticasone-Salmeterol  INHALE 1 PUFF INTO THE LUNGS 2 TIMES DAILY     albuterol (2.5 MG/3ML) 0.083% nebulizer solution  Commonly known as:  PROVENTIL  Take 2.5 mg by nebulization every 6 (six) hours as needed for wheezing or shortness of breath.     albuterol 108 (90 Base) MCG/ACT inhaler  Commonly known as:  PROAIR HFA  2 puffs every 4 hours as needed- rescue     ALPRAZolam 1 MG tablet  Commonly known as:  XANAX  1 tab, three times daily as needed     chlorpheniramine-HYDROcodone 10-8 MG/5ML Suer  Commonly known as:  TUSSIONEX  Take 5 mLs by mouth every 12 (twelve) hours.     dextromethorphan-guaiFENesin 30-600 MG 12hr tablet  Commonly known as:  MUCINEX DM  Take 1 tablet by mouth 2 (two) times daily.     fluticasone 50 MCG/ACT nasal spray  Commonly known as:  FLONASE  PLACE 2 SPRAYS INTO BOTH NOSTRILS DAILY.     morphine CONCENTRATE 10 MG/0.5ML Soln concentrated  solution  Take 0.25 mLs (5 mg total) by mouth every 4 (four) hours as needed for anxiety or shortness of breath.     pantoprazole 20 MG tablet  Commonly known as:  PROTONIX  TAKE 1 TABLET (20 MG TOTAL) BY MOUTH DAILY BEFORE SUPPER.     predniSONE 50 MG tablet  Commonly known as:  DELTASONE  Take 1 tablet (50 mg total) by mouth daily with breakfast.     SPIRIVA HANDIHALER 18 MCG inhalation capsule  Generic drug:  tiotropium  PLACE 1 CAPSULE (18 MCG TOTAL) INTO INHALER AND INHALE DAILY.     theophylline 400 MG 24 hr tablet  Commonly known as:  UNIPHYL  Take 0.5 tablets (200 mg total) by mouth 2 (two) times daily with a meal.     traZODone 50 MG tablet  Commonly known as:  DESYREL  Take 1 tablet (50 mg total) by mouth at bedtime.        Disposition and follow-up:   Wendy [name] was discharged from [facility] in Good condition.  At the hospital follow up visit please address:  1.  Please ensure patient completed her course of prednisone 69m, END date of 3/20. Please also assess if she is tolerating her liquid morphine [id number] prn for air hunger that was  started this admission.   2.  Labs / imaging needed at time of follow-up: none  3.  Pending labs/ test needing follow-up: none  Follow-up Appointments:     Follow-up Information    Follow up with Minerva Ends, MD On 12/02/2015.   Specialty:  Family Medicine   Why:  10 am for hospital follow up    Contact information:   [place] [id number] [phone]       Follow up with Deneise Lever, MD On 12/02/2015.   Specialty:  Pulmonary Disease   Why:  9:15 am to check in   Contact information:   [address] [place] [address] [phone]       Discharge Instructions:   Consultations: Treatment Team:  Palliative [facility]  Procedures Performed:  Dg Chest 2 View  11/22/2015  CLINICAL DATA:  Productive cough for 1 week EXAM: CHEST  2 VIEW COMPARISON:  09/29/2015  FINDINGS: There is patchy airspace disease in the peripheral and inferior right upper lobe. Normal heart size. Normal vascularity. Increased AP diameter of the chest. No pneumothorax. No pleural effusion. Stable mid-level thoracic compression deformity. IMPRESSION: Patchy right upper lobe airspace disease. Followup PA and lateral chest X-ray is recommended in 3-4 weeks following trial of antibiotic therapy to ensure resolution and exclude underlying malignancy. Electronically Signed   By: Marybelle Killings M.D.   On: 11/22/2015 08:29   Dg Chest Port 1 View  11/23/2015  CLINICAL DATA:  Shortness of breath.  History of COPD, asthma EXAM: PORTABLE CHEST 1 VIEW COMPARISON:  11/22/2015 FINDINGS: Patchy right upper lobe opacity again noted, similar to prior study. No confluent opacity on the left. Heart is borderline in size. No effusions or acute bony abnormality. IMPRESSION: Patchy right upper lobe opacity again noted, possibly early infiltrate/pneumonia. Recommend follow-up to resolution. Electronically Signed   By: Rolm Baptise M.D.   On: 11/23/2015 13:29     Admission HPI: 55 y/o F with past medical history of CHF (grade 1 diastolic dysfunction, EF 34-91% 09/2013), COPD on home oxygen, and depression who presents w/ productive cough, SOB, and chest pain for the past 5-7 days. Cough has been productive of yellow sputum. She has tried increasing her home O2 from her normal 3L to 3.5L and her home inhalers without any improvement in her breathing. She had a measure temperature of 1021F yesterday at home as well as one episode of non bloody emesis this morning. She lives with her son who has down syndrome and states he was recently sick. Her chest pain is located over her mid sternum and feels like a squeezing pain. She has a hx of acid reflux and takes over the counter rolaids. She follows with Dr. Annamaria Boots w/ pulmonology and is on chronic steroids since 2015. She is also on Humira for her plaque psoriasis.   In the ED,  she had a temperature of 102.21F, RR 23, and pulse 135. Chest xray revealed a patchy right upper lobe infiltrate concerning for pneumonia. Lactic acid was normal. Her blood pressure on presentation was 100/62 down to 83/67. She given 1.5L of NS, duoneb x1, IV solumedrol 118m, and started on aztreonam and vancomycin for pneumonia.   Hospital Course by problem list: Active Problems:   Adjustment disorder with mixed anxiety and depressed mood   Psoriasis   Pneumonia   [place]   PNA (pneumonia)   Community acquired pneumonia   Cough   Hypercapnic respiratory failure, chronic ([place]  OSA (obstructive sleep apnea)   COPD, severe (HCC)   Immunosuppressed status (HCC)   Goals of care, counseling/discussion   Respiratory failure, acute and chronic ([place])   Palliative care encounter   Dyspnea   COPD Exacerbation in setting of immunocompromization The patient presented with productive cough, chest pain, and worsening shortness of breath. She was found to be febrile and tachycardic. The patient is immunocompromised due to home medications of prednisone 7m daily and humira 2x month. She has severe COPD with PFT 02/13/2013 showing FEV1 of 49%. A CXR revealed very faint patchy RUL infiltrate, and the patient was empirically started on aztreonam and vancomycin to treat a possible pneumonia. On hospital day 1, these antibiotics were discontinued and azithromycin 250 mg daily was started, given that COPD exacerbation was felt to be a more likely diagnosis. The patient was placed on a bipap machine and initially required the bipap machine all night every night to maintain adequate ventilation. She received treatment with solumedrol, duonebs, mucinex, Flonase, and bipap as needed. The patient gradually improved. On hospital day 4, she was able to go all night without requiring the bipap machine. She was slowly weaned to her home dose of oxygen nasal cannula (3 liters). On hospital day 5, the patient was  transferred off of the stepdown unit to a floor bed. Physical exam showed decreased work of breathing, resolution of her wheezing, and larger breath volumes. The patient was encouraged to work with PT/OT and her anxiety was treated to allow her to feel comfortable participating in physical activity while in the hospital. She did well with this on 4 L oxygen, but continued to have periodic air hunger. She was given 2 mg IV morphine for her air hunger, which resulted in her becoming sleepy. Therefore oral morphine of 5 mg was recommended by palliative care. Wendy [name] well with this and her air hunger improved. She was discharged home with a 6 day course of prednisone 50 mg daily and instructions to resume her home prednisone 20 mg daily after completion of this course. Her home Advair and Spiriva were restarted. She has oxygen at home and was scheduled for close follow up with Dr. YAnnamaria Bootsand palliative care.  Anxiety At presentation, the patient was tachycardic and anxious. She was requesting Xanax, which she takes 1 mg TID at home. She had a known history of intentional overdose with alprazolam 2014.  Her Xanax was initially restarted during this admission at 0.5 mg TID. However, although the patient's pulmonary status improved, her anxiety began to worsen. She experienced several breath holding spells during which she desaturated to ~70s. She also had an episode of chest pain and shortness of breath which was thought to be likely due to anxiety and GERD after EKG, blood pressure, and troponin were found to be normal. Her Xanax was increased to her home dose of 1 mg TID, yet the patient continued to be anxious. She suffered from air hunger and felt unable to work with PT due to concern she would not be able to breath. The patient was started on Zoloft 25 mg daily but stopped b/c she felt it caused her SOB. She was counseled with regards to her anxiety and assured that clinically she appeared to be doing well.  After consultation with palliative care, the patient was started on 5 mg oral morphine as needed for severe air hunger. She found great relief with this medication. She was discharged home with her home dose of Xanax 1 mg  TID and a new prescription for oral morphine 5 mg to be managed by palliative care outpatient.  Intermittent chest pain, likely GERD At presentation, the patient endorsed intermittent substernal chest pain not worsened by exertion and sharp in nature. An EKG showed sinus tachycardia with no signs of cardiac ischemia, and repeat EKG showed normal sinus rhythm. Her troponin was negative. The patient had a known history of mild HFpEF (echo 2014 showed EF 55-60% with grade 1 diastolic dysfunction). She received chest pain with pantoprazole and her chest pain improved. On hospital day 4, the patient had an episode of chest pain and shortness of breath. EKG and troponin were again normal. Her chest pain was reproducible with palpation of the sternum, epigastric area, and lateral chest wall bilaterally. Her chest pain was thought to be due to musculoskeletal soreness from coughing and exacerbated by anxiety. She was treated for her anxiety and air hunger with xanax, zoloft, and moprhine as above.    Discharge Vitals:   BP 114/64 mmHg  Pulse 84  Temp(Src) 98.2 F (36.8 C) (Oral)  Resp 18  Ht 5' 1"  (1.549 m)  Wt 154 lb 8.7 oz (70.1 kg)  BMI 29.22 kg/m2  SpO2 96%  Discharge Labs:  Results for KASSIE, KENG (MRN [id number]) as of 12/02/2015 15:33  Ref. Range 11/24/2015 09:43  Sodium Latest Ref Range: 135-145 mmol/L 136  Potassium Latest Ref Range: 3.5-5.1 mmol/L 4.4  Chloride Latest Ref Range: 101-111 mmol/L 90 (L)  CO2 Latest Ref Range: 22-32 mmol/L 35 (H)  BUN Latest Ref Range: 6-20 mg/dL 13  Creatinine Latest Ref Range: 0.44-1.00 mg/dL 0.78  Calcium Latest Ref Range: 8.9-10.3 mg/dL 8.7 (L)  EGFR (Non-African Amer.) Latest Ref Range: >60 mL/min >60  EGFR (African American)  Latest Ref Range: >60 mL/min >60  Glucose Latest Ref Range: 65-99 mg/dL 153 (H)  Anion gap Latest Ref Range: 5-15  11  WBC Latest Ref Range: 4.0-10.5 K/uL 7.2  RBC Latest Ref Range: 3.87-5.11 MIL/uL 3.80 (L)  Hemoglobin Latest Ref Range: 12.0-15.0 g/dL 11.2 (L)  HCT Latest Ref Range: 36.0-46.0 % 36.2  MCV Latest Ref Range: 78.0-100.0 fL 95.3  MCH Latest Ref Range: 26.0-34.0 pg 29.5  MCHC Latest Ref Range: 30.0-36.0 g/dL 30.9  RDW Latest Ref Range: 11.5-15.5 % 13.8  Platelets Latest Ref Range: 150-400 K/uL 248    Signed: Norman Herrlich, MD 12/02/2015, 3:28 PM    Services Ordered on Discharge: home health PT/ RN Equipment Ordered on Discharge: none

## 2015-12-04 ENCOUNTER — Telehealth: Payer: Self-pay | Admitting: Critical Care Medicine

## 2015-12-04 ENCOUNTER — Emergency Department (HOSPITAL_COMMUNITY): Payer: Medicare Other

## 2015-12-04 ENCOUNTER — Inpatient Hospital Stay (HOSPITAL_COMMUNITY)
Admission: EM | Admit: 2015-12-04 | Discharge: 2015-12-21 | DRG: 208 | Disposition: A | Discharge status: Home Health | Payer: Medicare Other | Attending: Internal Medicine | Admitting: Internal Medicine

## 2015-12-04 ENCOUNTER — Encounter (HOSPITAL_COMMUNITY): Payer: Self-pay | Admitting: Emergency Medicine

## 2015-12-04 DIAGNOSIS — J9612 Chronic respiratory failure with hypercapnia: Secondary | ICD-10-CM | POA: Diagnosis present

## 2015-12-04 DIAGNOSIS — Z7952 Long term (current) use of systemic steroids: Secondary | ICD-10-CM | POA: Diagnosis not present

## 2015-12-04 DIAGNOSIS — M069 Rheumatoid arthritis, unspecified: Secondary | ICD-10-CM | POA: Diagnosis not present

## 2015-12-04 DIAGNOSIS — Z825 Family history of asthma and other chronic lower respiratory diseases: Secondary | ICD-10-CM

## 2015-12-04 DIAGNOSIS — Z79899 Other long term (current) drug therapy: Secondary | ICD-10-CM

## 2015-12-04 DIAGNOSIS — R579 Shock, unspecified: Secondary | ICD-10-CM | POA: Diagnosis not present

## 2015-12-04 DIAGNOSIS — Z515 Encounter for palliative care: Secondary | ICD-10-CM

## 2015-12-04 DIAGNOSIS — Z801 Family history of malignant neoplasm of trachea, bronchus and lung: Secondary | ICD-10-CM

## 2015-12-04 DIAGNOSIS — J9622 Acute and chronic respiratory failure with hypercapnia: Secondary | ICD-10-CM | POA: Diagnosis not present

## 2015-12-04 DIAGNOSIS — K5900 Constipation, unspecified: Secondary | ICD-10-CM | POA: Diagnosis not present

## 2015-12-04 DIAGNOSIS — Z9981 Dependence on supplemental oxygen: Secondary | ICD-10-CM

## 2015-12-04 DIAGNOSIS — R739 Hyperglycemia, unspecified: Secondary | ICD-10-CM | POA: Diagnosis present

## 2015-12-04 DIAGNOSIS — J9621 Acute and chronic respiratory failure with hypoxia: Secondary | ICD-10-CM | POA: Diagnosis not present

## 2015-12-04 DIAGNOSIS — J1100 Influenza due to unidentified influenza virus with unspecified type of pneumonia: Principal | ICD-10-CM | POA: Diagnosis present

## 2015-12-04 DIAGNOSIS — R509 Fever, unspecified: Secondary | ICD-10-CM | POA: Diagnosis not present

## 2015-12-04 DIAGNOSIS — J449 Chronic obstructive pulmonary disease, unspecified: Secondary | ICD-10-CM | POA: Diagnosis present

## 2015-12-04 DIAGNOSIS — R000 Tachycardia, unspecified: Secondary | ICD-10-CM | POA: Diagnosis not present

## 2015-12-04 DIAGNOSIS — Z6828 Body mass index (BMI) 28.0-28.9, adult: Secondary | ICD-10-CM

## 2015-12-04 DIAGNOSIS — G4733 Obstructive sleep apnea (adult) (pediatric): Secondary | ICD-10-CM | POA: Diagnosis present

## 2015-12-04 DIAGNOSIS — J9611 Chronic respiratory failure with hypoxia: Secondary | ICD-10-CM | POA: Diagnosis present

## 2015-12-04 DIAGNOSIS — Z87891 Personal history of nicotine dependence: Secondary | ICD-10-CM

## 2015-12-04 DIAGNOSIS — E669 Obesity, unspecified: Secondary | ICD-10-CM | POA: Diagnosis not present

## 2015-12-04 DIAGNOSIS — F329 Major depressive disorder, single episode, unspecified: Secondary | ICD-10-CM | POA: Diagnosis present

## 2015-12-04 DIAGNOSIS — J9600 Acute respiratory failure, unspecified whether with hypoxia or hypercapnia: Secondary | ICD-10-CM | POA: Diagnosis not present

## 2015-12-04 DIAGNOSIS — R0602 Shortness of breath: Secondary | ICD-10-CM | POA: Diagnosis not present

## 2015-12-04 DIAGNOSIS — F419 Anxiety disorder, unspecified: Secondary | ICD-10-CM | POA: Diagnosis present

## 2015-12-04 DIAGNOSIS — J9620 Acute and chronic respiratory failure, unspecified whether with hypoxia or hypercapnia: Secondary | ICD-10-CM | POA: Diagnosis not present

## 2015-12-04 DIAGNOSIS — R0682 Tachypnea, not elsewhere classified: Secondary | ICD-10-CM | POA: Diagnosis present

## 2015-12-04 DIAGNOSIS — J9690 Respiratory failure, unspecified, unspecified whether with hypoxia or hypercapnia: Secondary | ICD-10-CM

## 2015-12-04 DIAGNOSIS — E872 Acidosis: Secondary | ICD-10-CM | POA: Diagnosis not present

## 2015-12-04 DIAGNOSIS — I959 Hypotension, unspecified: Secondary | ICD-10-CM | POA: Diagnosis not present

## 2015-12-04 DIAGNOSIS — I5032 Chronic diastolic (congestive) heart failure: Secondary | ICD-10-CM | POA: Diagnosis present

## 2015-12-04 DIAGNOSIS — E871 Hypo-osmolality and hyponatremia: Secondary | ICD-10-CM | POA: Diagnosis present

## 2015-12-04 DIAGNOSIS — Y95 Nosocomial condition: Secondary | ICD-10-CM | POA: Diagnosis present

## 2015-12-04 DIAGNOSIS — R0600 Dyspnea, unspecified: Secondary | ICD-10-CM | POA: Diagnosis not present

## 2015-12-04 DIAGNOSIS — Z66 Do not resuscitate: Secondary | ICD-10-CM | POA: Diagnosis not present

## 2015-12-04 DIAGNOSIS — J441 Chronic obstructive pulmonary disease with (acute) exacerbation: Secondary | ICD-10-CM | POA: Diagnosis not present

## 2015-12-04 DIAGNOSIS — Z7189 Other specified counseling: Secondary | ICD-10-CM | POA: Insufficient documentation

## 2015-12-04 DIAGNOSIS — J101 Influenza due to other identified influenza virus with other respiratory manifestations: Secondary | ICD-10-CM | POA: Diagnosis not present

## 2015-12-04 DIAGNOSIS — Z978 Presence of other specified devices: Secondary | ICD-10-CM

## 2015-12-04 DIAGNOSIS — R069 Unspecified abnormalities of breathing: Secondary | ICD-10-CM | POA: Diagnosis not present

## 2015-12-04 DIAGNOSIS — Z7951 Long term (current) use of inhaled steroids: Secondary | ICD-10-CM | POA: Diagnosis not present

## 2015-12-04 DIAGNOSIS — F4323 Adjustment disorder with mixed anxiety and depressed mood: Secondary | ICD-10-CM | POA: Diagnosis not present

## 2015-12-04 DIAGNOSIS — R34 Anuria and oliguria: Secondary | ICD-10-CM | POA: Diagnosis not present

## 2015-12-04 DIAGNOSIS — Z452 Encounter for adjustment and management of vascular access device: Secondary | ICD-10-CM

## 2015-12-04 DIAGNOSIS — J111 Influenza due to unidentified influenza virus with other respiratory manifestations: Secondary | ICD-10-CM

## 2015-12-04 DIAGNOSIS — Z951 Presence of aortocoronary bypass graft: Secondary | ICD-10-CM | POA: Diagnosis not present

## 2015-12-04 DIAGNOSIS — E876 Hypokalemia: Secondary | ICD-10-CM | POA: Diagnosis not present

## 2015-12-04 DIAGNOSIS — J9601 Acute respiratory failure with hypoxia: Secondary | ICD-10-CM | POA: Diagnosis not present

## 2015-12-04 LAB — BLOOD GAS, ARTERIAL
Acid-Base Excess: 13.1 mmol/L — ABNORMAL HIGH (ref 0.0–2.0)
Bicarbonate: 41 mEq/L — ABNORMAL HIGH (ref 20.0–24.0)
Drawn by: 36277
FIO2: 0.6
LHR: 20 {breaths}/min
O2 SAT: 93.4 %
PATIENT TEMPERATURE: 98.6
PCO2 ART: 105 mmHg — AB (ref 35.0–45.0)
PEEP: 5 cmH2O
PH ART: 7.214 — AB (ref 7.350–7.450)
PO2 ART: 79.8 mmHg — AB (ref 80.0–100.0)
TCO2: 44.3 mmol/L (ref 0–100)
VT: 400 mL

## 2015-12-04 LAB — PROTIME-INR
INR: 0.98 (ref 0.00–1.49)
PROTHROMBIN TIME: 13.2 s (ref 11.6–15.2)

## 2015-12-04 LAB — BASIC METABOLIC PANEL
Anion gap: 12 (ref 5–15)
BUN: 7 mg/dL (ref 6–20)
CHLORIDE: 76 mmol/L — AB (ref 101–111)
CO2: 46 mmol/L — AB (ref 22–32)
CREATININE: 0.53 mg/dL (ref 0.44–1.00)
Calcium: 9.3 mg/dL (ref 8.9–10.3)
GFR calc non Af Amer: >60 mL/min (ref >60)
Glucose, Bld: 118 mg/dL — ABNORMAL HIGH (ref 65–99)
POTASSIUM: 4.3 mmol/L (ref 3.5–5.1)
Sodium: 134 mmol/L — ABNORMAL LOW (ref 135–145)

## 2015-12-04 LAB — TRIGLYCERIDES: TRIGLYCERIDES: 23 mg/dL (ref <150)

## 2015-12-04 LAB — CBC
HEMATOCRIT: 36.4 % (ref 36.0–46.0)
HEMOGLOBIN: 11.2 g/dL — AB (ref 12.0–15.0)
MCH: 28.9 pg (ref 26.0–34.0)
MCHC: 30.8 g/dL (ref 30.0–36.0)
MCV: 94.1 fL (ref 78.0–100.0)
PLATELETS: 383 10*3/uL (ref 150–400)
RBC: 3.87 MIL/uL (ref 3.87–5.11)
RDW: 13.5 % (ref 11.5–15.5)
WBC: 15.4 10*3/uL — ABNORMAL HIGH (ref 4.0–10.5)

## 2015-12-04 LAB — AMMONIA: AMMONIA: 60 umol/L — AB (ref 9–35)

## 2015-12-04 LAB — I-STAT CG4 LACTIC ACID, ED: LACTIC ACID, VENOUS: 3.46 mmol/L — AB (ref 0.5–2.0)

## 2015-12-04 LAB — ETHANOL: Alcohol, Ethyl (B): <5 mg/dL (ref <5)

## 2015-12-04 LAB — BRAIN NATRIURETIC PEPTIDE: B Natriuretic Peptide: 48.8 pg/mL (ref 0.0–100.0)

## 2015-12-04 LAB — TROPONIN I: Troponin I: 0.03 ng/mL (ref <0.031)

## 2015-12-04 MED ORDER — METHYLPREDNISOLONE SODIUM SUCC 125 MG IJ SOLR
125.0000 mg | Freq: Once | INTRAMUSCULAR | Status: AC
  Administered 2015-12-04: 125 mg via INTRAVENOUS
  Filled 2015-12-04: qty 2

## 2015-12-04 MED ORDER — SODIUM CHLORIDE 0.9 % IV BOLUS (SEPSIS)
1000.0000 mL | Freq: Once | INTRAVENOUS | Status: AC
  Administered 2015-12-04: 1000 mL via INTRAVENOUS

## 2015-12-04 MED ORDER — FENTANYL CITRATE (PF) 100 MCG/2ML IJ SOLN
100.0000 ug | INTRAMUSCULAR | Status: DC | PRN
  Administered 2015-12-04 (×2): 100 ug via INTRAVENOUS
  Filled 2015-12-04 (×2): qty 2

## 2015-12-04 MED ORDER — FENTANYL CITRATE (PF) 100 MCG/2ML IJ SOLN
50.0000 ug | Freq: Once | INTRAMUSCULAR | Status: AC
  Administered 2015-12-04: 50 ug via INTRAVENOUS
  Filled 2015-12-04: qty 2

## 2015-12-04 MED ORDER — SODIUM CHLORIDE 0.9 % IV SOLN
INTRAVENOUS | Status: DC
Start: 2015-12-05 — End: 2015-12-21
  Administered 2015-12-05: 50 mL via INTRAVENOUS
  Administered 2015-12-09 – 2015-12-20 (×2): via INTRAVENOUS

## 2015-12-04 MED ORDER — DEXTROSE 5 % IV SOLN
2.0000 g | Freq: Three times a day (TID) | INTRAVENOUS | Status: DC
  Administered 2015-12-05 – 2015-12-07 (×7): 2 g via INTRAVENOUS
  Filled 2015-12-04 (×9): qty 2

## 2015-12-04 MED ORDER — PHENYLEPHRINE 40 MCG/ML (10ML) SYRINGE FOR IV PUSH (FOR BLOOD PRESSURE SUPPORT)
80.0000 ug | PREFILLED_SYRINGE | Freq: Once | INTRAVENOUS | Status: DC
  Filled 2015-12-04: qty 2

## 2015-12-04 MED ORDER — INSULIN ASPART 100 UNIT/ML ~~LOC~~ SOLN
1.0000 [IU] | SUBCUTANEOUS | Status: DC
  Administered 2015-12-05 – 2015-12-06 (×3): 1 [IU] via SUBCUTANEOUS
  Administered 2015-12-06: 2 [IU] via SUBCUTANEOUS
  Administered 2015-12-06: 1 [IU] via SUBCUTANEOUS
  Administered 2015-12-06: 2 [IU] via SUBCUTANEOUS
  Administered 2015-12-07 – 2015-12-08 (×5): 1 [IU] via SUBCUTANEOUS
  Administered 2015-12-09: 2 [IU] via SUBCUTANEOUS
  Administered 2015-12-09 – 2015-12-10 (×3): 1 [IU] via SUBCUTANEOUS
  Administered 2015-12-11: 2 [IU] via SUBCUTANEOUS
  Administered 2015-12-11 – 2015-12-12 (×2): 1 [IU] via SUBCUTANEOUS

## 2015-12-04 MED ORDER — ALBUTEROL (5 MG/ML) CONTINUOUS INHALATION SOLN
10.0000 mg/h | INHALATION_SOLUTION | Freq: Once | RESPIRATORY_TRACT | Status: AC
  Administered 2015-12-04: 10 mg/h via RESPIRATORY_TRACT
  Filled 2015-12-04: qty 20

## 2015-12-04 MED ORDER — IPRATROPIUM-ALBUTEROL 0.5-2.5 (3) MG/3ML IN SOLN: 3.0000 mL | RESPIRATORY_TRACT | Status: DC

## 2015-12-04 MED ORDER — PHENYLEPHRINE 40 MCG/ML (10ML) SYRINGE FOR IV PUSH (FOR BLOOD PRESSURE SUPPORT): 80.0000 ug | PREFILLED_SYRINGE | Freq: Once | INTRAVENOUS | Status: DC

## 2015-12-04 MED ORDER — METHYLPREDNISOLONE SODIUM SUCC 125 MG IJ SOLR
60.0000 mg | Freq: Four times a day (QID) | INTRAMUSCULAR | Status: DC
  Administered 2015-12-05 – 2015-12-09 (×17): 60 mg via INTRAVENOUS
  Filled 2015-12-04 (×17): qty 2

## 2015-12-04 MED ORDER — IPRATROPIUM-ALBUTEROL 0.5-2.5 (3) MG/3ML IN SOLN
3.0000 mL | RESPIRATORY_TRACT | Status: DC
  Administered 2015-12-04: 3 mL via RESPIRATORY_TRACT
  Filled 2015-12-04: qty 3

## 2015-12-04 MED ORDER — VANCOMYCIN HCL IN DEXTROSE 750-5 MG/150ML-% IV SOLN
750.0000 mg | Freq: Two times a day (BID) | INTRAVENOUS | Status: DC
  Administered 2015-12-05 – 2015-12-06 (×4): 750 mg via INTRAVENOUS
  Filled 2015-12-04 (×6): qty 150

## 2015-12-04 MED ORDER — SODIUM CHLORIDE 0.9 % IV SOLN
25.0000 ug/h | INTRAVENOUS | Status: DC
  Administered 2015-12-04: 50 ug/h via INTRAVENOUS
  Administered 2015-12-05: 250 ug/h via INTRAVENOUS
  Administered 2015-12-06: 200 ug/h via INTRAVENOUS
  Administered 2015-12-06: 250 ug/h via INTRAVENOUS
  Administered 2015-12-07: 200 ug/h via INTRAVENOUS
  Filled 2015-12-04 (×5): qty 50

## 2015-12-04 MED ORDER — SUCCINYLCHOLINE CHLORIDE 20 MG/ML IJ SOLN
INTRAMUSCULAR | Status: DC | PRN
  Administered 2015-12-04: 100 mg via INTRAVENOUS

## 2015-12-04 MED ORDER — SENNOSIDES 8.8 MG/5ML PO SYRP
5.0000 mL | ORAL_SOLUTION | Freq: Two times a day (BID) | ORAL | Status: DC | PRN
  Filled 2015-12-04: qty 5

## 2015-12-04 MED ORDER — ETOMIDATE 2 MG/ML IV SOLN
20.0000 mg | Freq: Once | INTRAVENOUS | Status: AC
  Administered 2015-12-04: 20 mg via INTRAVENOUS

## 2015-12-04 MED ORDER — IPRATROPIUM BROMIDE 0.02 % IN SOLN
0.5000 mg | Freq: Once | RESPIRATORY_TRACT | Status: AC
  Administered 2015-12-04: 0.5 mg via RESPIRATORY_TRACT
  Filled 2015-12-04: qty 2.5

## 2015-12-04 MED ORDER — HEPARIN SODIUM (PORCINE) 5000 UNIT/ML IJ SOLN
5000.0000 [IU] | Freq: Three times a day (TID) | INTRAMUSCULAR | Status: DC
  Administered 2015-12-05 – 2015-12-21 (×48): 5000 [IU] via SUBCUTANEOUS
  Filled 2015-12-04 (×47): qty 1

## 2015-12-04 MED ORDER — ETOMIDATE 2 MG/ML IV SOLN
INTRAVENOUS | Status: DC | PRN
  Administered 2015-12-04: 20 mg via INTRAVENOUS

## 2015-12-04 MED ORDER — PREDNISONE 10 MG PO TABS: 60.0000 mg | ORAL_TABLET | Freq: Every day | ORAL | Status: DC

## 2015-12-04 MED ORDER — VANCOMYCIN HCL IN DEXTROSE 1-5 GM/200ML-% IV SOLN
1000.0000 mg | INTRAVENOUS | Status: AC
  Administered 2015-12-04: 1000 mg via INTRAVENOUS
  Filled 2015-12-04: qty 200

## 2015-12-04 MED ORDER — SODIUM CHLORIDE 0.9 % IV SOLN: 250.0000 mL | INTRAVENOUS | Status: DC | PRN

## 2015-12-04 MED ORDER — MIDAZOLAM HCL 2 MG/2ML IJ SOLN
2.0000 mg | INTRAMUSCULAR | Status: DC | PRN
  Administered 2015-12-04: 2 mg via INTRAVENOUS
  Filled 2015-12-04 (×2): qty 2

## 2015-12-04 MED ORDER — FENTANYL BOLUS VIA INFUSION
50.0000 ug | INTRAVENOUS | Status: DC | PRN
  Filled 2015-12-04: qty 50

## 2015-12-04 MED ORDER — BUDESONIDE 0.5 MG/2ML IN SUSP
0.2500 mg | Freq: Two times a day (BID) | RESPIRATORY_TRACT | Status: DC
  Filled 2015-12-04: qty 2

## 2015-12-04 MED ORDER — BISACODYL 10 MG RE SUPP: 10.0000 mg | Freq: Every day | RECTAL | Status: DC | PRN

## 2015-12-04 MED ORDER — MIDAZOLAM HCL 2 MG/2ML IJ SOLN
2.0000 mg | INTRAMUSCULAR | Status: DC | PRN
  Administered 2015-12-06: 2 mg via INTRAVENOUS

## 2015-12-04 MED ORDER — PHENYLEPHRINE HCL 10 MG/ML IJ SOLN
0.0000 ug/min | INTRAMUSCULAR | Status: DC
  Filled 2015-12-04: qty 1

## 2015-12-04 MED ORDER — ALBUTEROL SULFATE (2.5 MG/3ML) 0.083% IN NEBU
2.5000 mg | INHALATION_SOLUTION | RESPIRATORY_TRACT | Status: DC | PRN
Start: 2015-12-04 — End: 2015-12-21
  Administered 2015-12-18 – 2015-12-21 (×3): 2.5 mg via RESPIRATORY_TRACT
  Filled 2015-12-04 (×3): qty 3

## 2015-12-04 MED ORDER — SUCCINYLCHOLINE CHLORIDE 20 MG/ML IJ SOLN
100.0000 mg | Freq: Once | INTRAMUSCULAR | Status: AC
  Administered 2015-12-04: 100 mg via INTRAVENOUS
  Filled 2015-12-04: qty 5

## 2015-12-04 MED ORDER — PROPOFOL 1000 MG/100ML IV EMUL
0.0000 ug/kg/min | INTRAVENOUS | Status: DC
Start: 2015-12-04 — End: 2015-12-07
  Administered 2015-12-04: 35 ug/kg/min via INTRAVENOUS
  Administered 2015-12-04: 10 ug/kg/min via INTRAVENOUS
  Administered 2015-12-05: 5 ug/kg/min via INTRAVENOUS
  Administered 2015-12-05: 20 ug/kg/min via INTRAVENOUS
  Administered 2015-12-06: 30 ug/kg/min via INTRAVENOUS
  Administered 2015-12-06: 25 ug/kg/min via INTRAVENOUS
  Administered 2015-12-06: 20 ug/kg/min via INTRAVENOUS
  Administered 2015-12-07: 15 ug/kg/min via INTRAVENOUS
  Administered 2015-12-07: 30 ug/kg/min via INTRAVENOUS
  Filled 2015-12-04 (×8): qty 100

## 2015-12-04 MED ORDER — FENTANYL CITRATE (PF) 100 MCG/2ML IJ SOLN
100.0000 ug | INTRAMUSCULAR | Status: AC | PRN
  Administered 2015-12-04 (×3): 100 ug via INTRAVENOUS
  Filled 2015-12-04 (×3): qty 2

## 2015-12-04 MED ORDER — AZTREONAM 2 G IJ SOLR
2.0000 g | Freq: Once | INTRAMUSCULAR | Status: AC
  Administered 2015-12-04: 2 g via INTRAVENOUS
  Filled 2015-12-04: qty 2

## 2015-12-04 MED ORDER — PANTOPRAZOLE SODIUM 40 MG IV SOLR
40.0000 mg | Freq: Every day | INTRAVENOUS | Status: DC
  Administered 2015-12-05 – 2015-12-08 (×5): 40 mg via INTRAVENOUS
  Filled 2015-12-04 (×5): qty 40

## 2015-12-04 NOTE — ED Notes (Signed)
Family at bedside. 

## 2015-12-04 NOTE — Telephone Encounter (Signed)
Pt just d/c from hosp. Pt is full code, Pt less responsive and not improving.  I told daughter to call ems to take pt back to ED

## 2015-12-04 NOTE — Progress Notes (Signed)
eLink Physician-Brief Progress Note Patient Name: Wendy [name] DOB: 1961/02/04 MRN: [id number]   Date of Service  12/04/2015  HPI/Events of Note  COPD with acute exacerbation and now on vent  eICU Interventions  See full note to follow See orders     Intervention Category Major Interventions: Respiratory failure - evaluation and management  Shan Levans 12/04/2015, 10:00 PM

## 2015-12-04 NOTE — ED Notes (Signed)
Per EMS, pt became SOB this afternoon. Was seen in ED for same on 3/16. Received 10mg  albuterol, .5mg  atrovent, and 125mg  solu-medrol en route. LS diminished bilat on scene, became exp wheezes after breathing treatment.

## 2015-12-04 NOTE — Progress Notes (Signed)
Pharmacy Antibiotic Note  Wendy [name] is a 55 y.o. female admitted on 12/04/2015 with pneumonia.  Pharmacy has been consulted for Aztreonam dosing.  WBC elevated, normal renal function   Plan: -Aztreonam 2g IV q8h -Already on vancomycin   Height: 5\' 1"  (154.9 cm) Weight: 152 lb (68.947 kg) IBW/kg (Calculated) : 47.8  Temp (24hrs), Avg:98.7 F (37.1 C), Min:98.7 F (37.1 C), Max:98.7 F (37.1 C)   Recent Labs Lab 12/04/15 2020 12/04/15 2152  WBC 15.4*  --   CREATININE 0.53  --   LATICACIDVEN  --  3.46*    Estimated Creatinine Clearance: 68.8 mL/min (by C-G formula based on Cr of 0.53).    Allergies  Allergen Reactions  . Augmentin [Amoxicillin-Pot Clavulanate] Nausea Only  . Cefdinir Other (See Comments)    Aches, "heart fluttering"  . Other Nausea Only    PAIN MEDICATIONS-nausea  . Codeine Nausea Only  . Fexofenadine Nausea Only      2153 12/04/2015 11:53 PM

## 2015-12-04 NOTE — Progress Notes (Signed)
ANTIBIOTIC CONSULT NOTE - INITIAL  Pharmacy Consult for Vancomycin Indication: pneumonia  Allergies  Allergen Reactions  . Augmentin [Amoxicillin-Pot Clavulanate] Nausea Only  . Cefdinir Other (See Comments)    Aches, "heart fluttering"  . Other Nausea Only    PAIN MEDICATIONS-nausea  . Codeine Nausea Only  . Fexofenadine Nausea Only    Patient Measurements: Height: 5\' 1"  (154.9 cm) Weight: 152 lb (68.947 kg) IBW/kg (Calculated) : 47.8 Adjusted Body Weight:   Vital Signs: Temp: 98.7 F (37.1 C) (03/19 1949) Temp Source: Oral (03/19 1949) BP: 142/83 mmHg (03/19 2140) Pulse Rate: 129 (03/19 2140) Intake/Output from previous day:   Intake/Output from this shift:    Labs:  Recent Labs  12/04/15 2020  WBC 15.4*  HGB 11.2*  PLT 383  CREATININE 0.53   Estimated Creatinine Clearance: 68.8 mL/min (by C-G formula based on Cr of 0.53). No results for input(s): VANCOTROUGH, VANCOPEAK, VANCORANDOM, GENTTROUGH, GENTPEAK, GENTRANDOM, TOBRATROUGH, TOBRAPEAK, TOBRARND, AMIKACINPEAK, AMIKACINTROU, AMIKACIN in the last 72 hours.   Microbiology:   Medical History: Past Medical History  Diagnosis Date  . Asthma   . Seasonal allergies   . Poor dentition   . Alcoholism (HCC)   . COPD (chronic obstructive pulmonary disease) (HCC)   . Depression   . Oxygen dependent     home oxygen 3L/min  . Urinary, incontinence, stress female   . Psoriasis     Assessment: 55 y/o F presents with SOB. Patient had recent hospitalization this month. Patient has a h/o asthma, allergies, COPD and is O2 dependent at home. Start Vancomycin in the ED for PNA.  Afebrile. WBC 15.4 elevated. Scr 0.53   Goal of Therapy:  Vancomycin trough level 15-20 mcg/ml  Plan:  Vancomycin 1g IV and Aztreonam 2g IV x 1 in the ED then, Vancomycin 750mg  IV q12h Vanco trough after 3-5 doses at steady state.   Nithila Sumners S. 56, PharmD, BCPS Clinical Staff Pharmacist Pager [phone]  Merilynn Finland  [place] 12/04/2015,9:47 PM

## 2015-12-04 NOTE — ED Notes (Signed)
Informed doctor of pt blood pressure of 70/22. Verbal order to give one liter NS.

## 2015-12-04 NOTE — ED Notes (Signed)
MD at bedside. 

## 2015-12-04 NOTE — Progress Notes (Signed)
Pt transported from ED to 2S-01 without event.

## 2015-12-04 NOTE — ED Notes (Signed)
Pt placed in mitts to prevent from pulling at tubs

## 2015-12-04 NOTE — H&P (Signed)
PULMONARY / CRITICAL CARE MEDICINE   Name: Wendy [name] MRN: [id number] DOB: 1961-05-26    ADMISSION DATE:  12/04/2015 CONSULTATION DATE:  12/04/15  REFERRING MD:  EDP  CHIEF COMPLAINT:  Difficulty breathing  HISTORY OF PRESENT ILLNESS:   Wendy [name] is 45F with history of end stage COPD (FEV1 19% predicted; 0.48L in 2014) followed by Wendy [name], who presented with shortness of breath. She was given albuterol and steroids by EMS, but was using accessory muscles on arrival to the ED. I-stat ABG showed pCO2 105 w/ pH 7.21. The patient agreed to intubation.   Per the family, no recent sick contacts, no increase in cough or sputum. She was just in the hospital for several days with minimal improvement in her respiratory status. Her daughter says that her feet have been a little swollen since discharge. She uses 3.5L o2 chronically at home. She quit smoking ~3 years ago.  During her last admission she was seen by palliative care and started on morphine to relieve her air hunger. She was discharged on 50mg  prednisone (stop date 3/20). Her family states that they have previously discussed goals of care and that she would not want to be on mechanical ventilation for a prolonged period. She would not want tracheotomy.   PAST MEDICAL HISTORY :  She  has a past medical history of Asthma; Seasonal allergies; Poor dentition; Alcoholism (HCC); COPD (chronic obstructive pulmonary disease) (HCC); Depression; Oxygen dependent; Urinary, incontinence, stress female; and Psoriasis.  PAST SURGICAL HISTORY: She  has past surgical history that includes Cesarean section and Tubal ligation.  Allergies  Allergen Reactions  . Augmentin [Amoxicillin-Pot Clavulanate] Nausea Only  . Cefdinir Other (See Comments)    Aches, "heart fluttering"  . Other Nausea Only    PAIN MEDICATIONS-nausea  . Codeine Nausea Only  . Fexofenadine Nausea Only    No current facility-administered medications on file prior to  encounter.   Current Outpatient Prescriptions on File Prior to Encounter  Medication Sig  . Adalimumab (HUMIRA PEN-CROHNS STARTER) 40 MG/0.8ML PNKT Inject 40 mg into the skin every 14 (fourteen) days.  4/20 ADVAIR DISKUS 500-50 MCG/DOSE AEPB INHALE 1 PUFF INTO THE LUNGS 2 TIMES DAILY  . albuterol (PROAIR HFA) 108 (90 BASE) MCG/ACT inhaler 2 puffs every 4 hours as needed- rescue (Patient taking differently: Inhale 2 puffs into the lungs every 4 (four) hours as needed for wheezing. 2 puffs every 4 hours as needed- rescue)  . albuterol (PROVENTIL) (2.5 MG/3ML) 0.083% nebulizer solution Take 2.5 mg by nebulization every 6 (six) hours as needed for wheezing or shortness of breath.  . ALPRAZolam (XANAX) 1 MG tablet 1 tab, three times daily as needed (Patient taking differently: Take 1 mg by mouth 3 (three) times daily as needed for anxiety. 1 tab, three times daily as needed)  . chlorpheniramine-HYDROcodone (TUSSIONEX) 10-8 MG/5ML SUER Take 5 mLs by mouth every 12 (twelve) hours.  . fluticasone (FLONASE) 50 MCG/ACT nasal spray PLACE 2 SPRAYS INTO BOTH NOSTRILS DAILY.  Marland Kitchen Morphine Sulfate (MORPHINE CONCENTRATE) 10 MG/0.5ML SOLN concentrated solution Take 0.25 mLs (5 mg total) by mouth every 4 (four) hours as needed for anxiety or shortness of breath.  . pantoprazole (PROTONIX) 20 MG tablet TAKE 1 TABLET (20 MG TOTAL) BY MOUTH DAILY BEFORE SUPPER.  . predniSONE (DELTASONE) 50 MG tablet Take 1 tablet (50 mg total) by mouth daily with breakfast.  . SPIRIVA HANDIHALER 18 MCG inhalation capsule PLACE 1 CAPSULE (18 MCG TOTAL) INTO INHALER AND INHALE DAILY.  Marland Kitchen  theophylline (UNIPHYL) 400 MG 24 hr tablet Take 0.5 tablets (200 mg total) by mouth 2 (two) times daily with a meal.  . traZODone (DESYREL) 50 MG tablet Take 1 tablet (50 mg total) by mouth at bedtime.    FAMILY HISTORY:  Her reported the following about her mother: alive and well. She indicated that her father is deceased.   SOCIAL HISTORY: She  reports  that she quit smoking about 2 years ago. Her smoking use included Cigarettes. She started smoking about 45 years ago. She has a 40 pack-year smoking history. She has never used smokeless tobacco. She reports that she does not drink alcohol or use illicit drugs.  REVIEW OF SYSTEMS:   Unable to obtain 2/2 intubated state  SUBJECTIVE:    VITAL SIGNS: BP 101/76 mmHg  Pulse 128  Temp(Src) 98.7 F (37.1 C) (Oral)  Resp 18  Ht 5\' 1"  (1.549 m)  Wt 68.947 kg (152 lb)  BMI 28.74 kg/m2  SpO2 100%  HEMODYNAMICS:    VENTILATOR SETTINGS: Vent Mode:  [-] PRVC FiO2 (%):  [40 %-60 %] 40 % Set Rate:  [20 bmp] 20 bmp Vt Set:  [400 mL] 400 mL PEEP:  [5 cmH20] 5 cmH20 Plateau Pressure:  [30 cmH20] 30 cmH20  INTAKE / OUTPUT:    PHYSICAL EXAMINATION: Physical Exam: Temp:  [98.7 F (37.1 C)] 98.7 F (37.1 C) (03/19 1949) Pulse Rate:  [112-141] 128 (03/19 2145) Resp:  [14-23] 18 (03/19 2145) BP: (70-163)/(22-90) 101/76 mmHg (03/19 2145) SpO2:  [95 %-100 %] 100 % (03/19 2145) FiO2 (%):  [40 %-60 %] 40 % (03/19 2100) Weight:  [68.947 kg (152 lb)] 68.947 kg (152 lb) (03/19 1949)  General Well nourished, well developed, obese, intubated, sedated.   HEENT No gross abnormalities. ETT in place. Pupils equal and reactive.   Pulmonary Markedly diminished bilaterally with minimal air movement and pronounced expiratory wheeze. Vent-assisted. Symmetrical expansion.    Cardiovascular Normal rate, regular rhythm. S1, s2. No m/r/g. Distal pulses palpable.  Abdomen Soft, non-tender, non-distended, positive bowel sounds, no palpable organomegaly or masses. Obese.  Normoresonant to percussion.  Musculoskeletal No bony abnormalities.   Lymphatics No cervical, supraclavicular or axillary adenopathy.   Neurologic Grossly intact. No focal deficits. Follows commands briskly.   Skin/Integuement No rash, no cyanosis, no clubbing.     LABS:  BMET  Recent Labs Lab 12/04/15 2020  NA 134*  K 4.3  CL 76*   CO2 46*  BUN 7  CREATININE 0.53  GLUCOSE 118*    Electrolytes  Recent Labs Lab 12/04/15 2020  CALCIUM 9.3    CBC  Recent Labs Lab 12/04/15 2020  WBC 15.4*  HGB 11.2*  HCT 36.4  PLT 383    Coag's  Recent Labs Lab 12/04/15 2020  INR 0.98    Sepsis Markers No results for input(s): LATICACIDVEN, PROCALCITON, O2SATVEN in the last 168 hours.  ABG  Recent Labs Lab 11/29/15 1255  PHART 7.304*  PCO2ART 88.9*  PO2ART 84.0    Liver Enzymes No results for input(s): AST, ALT, ALKPHOS, BILITOT, ALBUMIN in the last 168 hours.  Cardiac Enzymes  Recent Labs Lab 12/04/15 2020  TROPONINI 0.03    Glucose No results for input(s): GLUCAP in the last 168 hours.  Imaging Dg Chest Port 1 View  12/04/2015  CLINICAL DATA:  Shortness of Breath EXAM: PORTABLE CHEST 1 VIEW COMPARISON:  11/23/2015 FINDINGS: Endotracheal tube has been placed and now lies 4.4 cm above the carina. The cardiac shadow is within  normal limits. The lungs are well aerated bilaterally. No acute bony abnormality is seen. IMPRESSION: Endotracheal tube as described.  No acute abnormality noted. Electronically Signed   By: Alcide Clever M.D.   On: 12/04/2015 21:41    STUDIES:   CULTURES: Blood 3/19   ANTIBIOTICS: Vanc Aztreonam  SIGNIFICANT EVENTS:  LINES/TUBES: ETT 3/19 PIV 3/19  DISCUSSION: Ms. Garramone is a 59F w/ end stage COPD (FEV1 19% predicted in 2014) admitted with acute on chronic hypercapneic respiratory failure requiring intubation. Other medical history includes diastolic heart failure, obstructive sleep apnea and rheumatoid arthritis on Humira and prednisone.   ASSESSMENT / PLAN:  PULMONARY A: Acute on chronic mixed hypercapneic/hypoxemic respiratory failure Acute exacerbation of COPD P:   Continue ventilatory support Wean as tolerated SBT when able Continue daily steroids - 60mg  daily Continue albuterol prn Budesonide nebs BID Agree with vanc/aztreonam given recent  hospitalization  CARDIOVASCULAR A:  Hypotension post intubation - resolved Sinus tachycardia P:  Monitor BP Diurese when able Monitor HR  RENAL A:   Lactic acidosis secondary to respiratory failure P:   Trend lactate  GASTROINTESTINAL A:   No acute issues P:   NPO for now Place OGT Stress ulcer ppx  HEMATOLOGIC A:   No acute issues P:  Monitor  INFECTIOUS A:   Concern for health-care associated infection SIRS w/o clear infectious source; possible PNA P:   Continue vancomycin and aztreonam Follow cultures Send RVP, too.  ENDOCRINE A:   No acute issues P:   Monitor glucose  RHEUMATOLOGIC: A: Rheumatoid arthritis on Humira, prednisone P: Hold Humira while acutely ill Prednisone 60mg  daily as above  NEUROLOGIC A:   No acute issues P:   RASS goal: 0 to -1 Fentanyl gtt and PRN versed for sedation/analgesia   FAMILY  - Updates: Family updated at bedside. Daughter is primary  - Inter-disciplinary family meet or Palliative Care meeting due by:  day 7  The patient is critically ill with multiple organ system failure and requires high complexity decision making for assessment and support, frequent evaluation and titration of therapies, advanced monitoring, review of radiographic studies and interpretation of complex data.   Critical Care Time devoted to patient care services, exclusive of separately billable procedures, described in this note is 58 minutes.   , MD Pulmonary and Critical Care Medicine [facility] Pager: [phone] 12/04/2015, 9:50 PM

## 2015-12-04 NOTE — Code Documentation (Signed)
Tube 21 at mouth.

## 2015-12-04 NOTE — ED Provider Notes (Signed)
CSN: [id number]     Arrival date & time 12/04/15  1935 History   First MD Initiated Contact with Patient 12/04/15 1943     Chief Complaint  Patient presents with  . Shortness of Breath   HPI Patient presents to the emergency room with complaints of shortness of breath. Patient was recently admitted to the hospital earlier this month on March 7. Patient states she was discharged from the hospital earlier this week.  Patient states she had been doing well until today when she began to feel very short of breath. Denies any chest pain. She denies fevers. The patient called 911 and was brought into the emergency room. Past Medical History  Diagnosis Date  . Asthma   . Seasonal allergies   . Poor dentition   . Alcoholism (HCC)   . COPD (chronic obstructive pulmonary disease) (HCC)   . Depression   . Oxygen dependent     home oxygen 3L/min  . Urinary, incontinence, stress female   . Psoriasis    Past Surgical History  Procedure Laterality Date  . Cesarean section      x 2  . Tubal ligation     Family History  Problem Relation Age of Onset  . Lung cancer Father   . COPD Father    Social History  Substance Use Topics  . Smoking status: Former Smoker -- 1.00 packs/day for 40 years    Types: Cigarettes    Start date: 11/10/1970    Quit date: 09/17/2013  . Smokeless tobacco: Never Used  . Alcohol Use: No     Comment: 5 cans of beer daily  " 06/2014 drinks occasional ".  3s/2/16 No longer drink   OB History    No data available     Review of Systems  All other systems reviewed and are negative.     Allergies  Augmentin; Cefdinir; Other; Codeine; and Fexofenadine  Home Medications   Prior to Admission medications   Medication Sig Start Date End Date Taking? Authorizing Provider  Adalimumab (HUMIRA PEN-CROHNS STARTER) 40 MG/0.8ML PNKT Inject 40 mg into the skin every 14 (fourteen) days. 05/17/15  Yes Josalyn Funches, MD  ADVAIR DISKUS 500-50 MCG/DOSE AEPB INHALE 1 PUFF  INTO THE LUNGS 2 TIMES DAILY 08/29/15  Yes Waymon Budge, MD  albuterol (PROAIR HFA) 108 (90 BASE) MCG/ACT inhaler 2 puffs every 4 hours as needed- rescue Patient taking differently: Inhale 2 puffs into the lungs every 4 (four) hours as needed for wheezing. 2 puffs every 4 hours as needed- rescue 03/02/15  Yes Waymon Budge, MD  albuterol (PROVENTIL) (2.5 MG/3ML) 0.083% nebulizer solution Take 2.5 mg by nebulization every 6 (six) hours as needed for wheezing or shortness of breath.   Yes Historical Provider, MD  ALPRAZolam Prudy Feeler) 1 MG tablet 1 tab, three times daily as needed Patient taking differently: Take 1 mg by mouth 3 (three) times daily as needed for anxiety. 1 tab, three times daily as needed 11/14/15  Yes Waymon Budge, MD  chlorpheniramine-HYDROcodone (TUSSIONEX) 10-8 MG/5ML SUER Take 5 mLs by mouth every 12 (twelve) hours. 11/30/15  Yes Denton Brick, MD  fluticasone (FLONASE) 50 MCG/ACT nasal spray PLACE 2 SPRAYS INTO BOTH NOSTRILS DAILY. 10/28/15  Yes Waymon Budge, MD  Morphine Sulfate (MORPHINE CONCENTRATE) 10 MG/0.5ML SOLN concentrated solution Take 0.25 mLs (5 mg total) by mouth every 4 (four) hours as needed for anxiety or shortness of breath. 11/30/15  Yes Denton Brick, MD  pantoprazole (  PROTONIX) 20 MG tablet TAKE 1 TABLET (20 MG TOTAL) BY MOUTH DAILY BEFORE SUPPER. 08/24/15  Yes Waymon Budge, MD  predniSONE (DELTASONE) 50 MG tablet Take 1 tablet (50 mg total) by mouth daily with breakfast. 11/30/15  Yes Denton Brick, MD  SPIRIVA HANDIHALER 18 MCG inhalation capsule PLACE 1 CAPSULE (18 MCG TOTAL) INTO INHALER AND INHALE DAILY. 10/18/15  Yes Waymon Budge, MD  theophylline (UNIPHYL) 400 MG 24 hr tablet Take 0.5 tablets (200 mg total) by mouth 2 (two) times daily with a meal. 12/07/14  Yes Waymon Budge, MD  traZODone (DESYREL) 50 MG tablet Take 1 tablet (50 mg total) by mouth at bedtime. 11/07/15  Yes Waymon Budge, MD   BP 101/76 mmHg  Pulse 128  Temp(Src) 98.7 F  (37.1 C) (Oral)  Resp 18  Ht 5\' 1"  (1.549 m)  Wt 68.947 kg  BMI 28.74 kg/m2  SpO2 100% Physical Exam  Constitutional: She appears listless. No distress.  Appears older than her age  HENT:  Head: Normocephalic and atraumatic.  Right Ear: External ear normal.  Left Ear: External ear normal.  Eyes: Conjunctivae are normal. Right eye exhibits no discharge. Left eye exhibits no discharge. No scleral icterus.  Neck: Neck supple. No tracheal deviation present.  Cardiovascular: Regular rhythm and intact distal pulses.  Tachycardia present.   Pulmonary/Chest: Accessory muscle usage present. No stridor. Tachypnea noted. No respiratory distress. She has decreased breath sounds. She has wheezes. She has no rales.  Abdominal: Soft. Bowel sounds are normal. She exhibits no distension. There is no tenderness. There is no rebound and no guarding.  Musculoskeletal: She exhibits no edema or tenderness.  Neurological: She has normal strength. She appears listless. She is not disoriented. No cranial nerve deficit (no facial droop, extraocular movements intact, no slurred speech) or sensory deficit. She exhibits normal muscle tone. She displays no seizure activity. Coordination normal. GCS eye subscore is 4. GCS verbal subscore is 5. GCS motor subscore is 6.  Skin: Skin is warm and dry. No rash noted.  Psychiatric: She has a normal mood and affect.  Nursing note and vitals reviewed.   ED Course  .Intubation Date/Time: 12/04/2015 8:41 PM Performed by: 12/06/2015 Authorized by: Linwood Dibbles Consent: The procedure was performed in an emergent situation. Verbal consent obtained. Indications: hypercapnia Intubation method: video-assisted Patient status: paralyzed (RSI) Preoxygenation: nonrebreather mask Sedatives: etomidate Paralytic: succinylcholine Laryngoscope size: Mac 4 Tube size: 7.5 mm Tube type: cuffed Number of attempts: 1 Cricoid pressure: yes Cords visualized: yes Post-procedure assessment:  chest rise and ETCO2 monitor Breath sounds: equal Cuff inflated: yes ETT to lip: 21 cm Tube secured with: ETT holder    CRITICAL CARE Performed by: Linwood Dibbles Total critical care time: 35  minutes Critical care time was exclusive of separately billable procedures and treating other patients. Critical care was necessary to treat or prevent imminent or life-threatening deterioration. Critical care was time spent personally by me on the following activities: development of treatment plan with patient and/or surrogate as well as nursing, discussions with consultants, evaluation of patient's response to treatment, examination of patient, obtaining history from patient or surrogate, ordering and performing treatments and interventions, ordering and review of laboratory studies, ordering and review of radiographic studies, pulse oximetry and re-evaluation of patient's condition.  2100  BP dropped after sedative medications with propofol and fentanyl.   Initially down to 70 now 40 systolic. Will give fluid bolus and dose of phenylephrine, continue fluid , hold propofol  and reassess 2140 BP increased to the 90s with fluid.  Phenylephrine was not given.  Will hold propofol.  Continue fentanyl  Labs Review Labs Reviewed  BASIC METABOLIC PANEL - Abnormal; Notable for the following:    Sodium 134 (*)    Chloride 76 (*)    CO2 46 (*)    Glucose, Bld 118 (*)    All other components within normal limits  CBC - Abnormal; Notable for the following:    WBC 15.4 (*)    Hemoglobin 11.2 (*)    All other components within normal limits  TROPONIN I  BRAIN NATRIURETIC PEPTIDE  ETHANOL  PROTIME-INR  AMMONIA  TRIGLYCERIDES  BLOOD GAS, ARTERIAL  I-STAT CG4 LACTIC ACID, ED  I-STAT ARTERIAL BLOOD GAS, ED    Imaging Review Dg Chest Port 1 View  12/04/2015  CLINICAL DATA:  Shortness of Breath EXAM: PORTABLE CHEST 1 VIEW COMPARISON:  11/23/2015 FINDINGS: Endotracheal tube has been placed and now lies 4.4 cm  above the carina. The cardiac shadow is within normal limits. The lungs are well aerated bilaterally. No acute bony abnormality is seen. IMPRESSION: Endotracheal tube as described.  No acute abnormality noted. Electronically Signed   By: Alcide Clever M.D.   On: 12/04/2015 21:41   I have personally reviewed and evaluated these images and lab results as part of my medical decision-making.   EKG Interpretation   Date/Time:  Sunday December 04 2015 19:59:32 EDT Ventricular Rate:  117 PR Interval:  154 QRS Duration: 87 QT Interval:  329 QTC Calculation: 459 R Axis:   -77 Text Interpretation:  Sinus tachycardia , sinec last tracing , rate faster  Ventricular premature complex Biatrial enlargement RSR' in V1 or V2,  probably normal variant Nonspecific T abnrm, anterolateral leads , suspect  related to the quality of the ECG Confirmed by Beverlyn Mcginness  MD-J, Bari Leib ([id number]) on  12/04/2015 8:08:43 PM      MDM   Final diagnoses:  Acute on chronic respiratory failure with hypercapnia (HCC)   Patient has severe COPD. She was recently in the hospital for similar symptoms.  The patient continued to decline after leaving the hospital. Here in the emergency room her initial ABG showed pH 7.2 range with a PCO2 greater than 100.  I discussed this finding with the patient. We discussed intubation because of her severe hypercapnia.  Pt agreed.    Pt was successfully intubated.  She had transient hypotension after the sedative medications but that has improved.  Will continue with fentanyl.  Try to add back propofol after fluid boluses     Linwood Dibbles, MD 12/04/15 2156

## 2015-12-05 ENCOUNTER — Telehealth: Payer: Self-pay | Admitting: Internal Medicine

## 2015-12-05 ENCOUNTER — Inpatient Hospital Stay (HOSPITAL_COMMUNITY): Payer: Medicare Other

## 2015-12-05 ENCOUNTER — Ambulatory Visit: Payer: Medicare Other | Admitting: Internal Medicine

## 2015-12-05 DIAGNOSIS — J9612 Chronic respiratory failure with hypercapnia: Secondary | ICD-10-CM

## 2015-12-05 DIAGNOSIS — J9622 Acute and chronic respiratory failure with hypercapnia: Secondary | ICD-10-CM

## 2015-12-05 DIAGNOSIS — J449 Chronic obstructive pulmonary disease, unspecified: Secondary | ICD-10-CM

## 2015-12-05 DIAGNOSIS — R0602 Shortness of breath: Secondary | ICD-10-CM

## 2015-12-05 LAB — URINALYSIS, ROUTINE W REFLEX MICROSCOPIC
Bilirubin Urine: NEGATIVE
Glucose, UA: 500 mg/dL — AB
Hgb urine dipstick: NEGATIVE
Ketones, ur: 40 mg/dL — AB
NITRITE: NEGATIVE
PH: 6 (ref 5.0–8.0)
Protein, ur: NEGATIVE mg/dL
SPECIFIC GRAVITY, URINE: 1.033 — AB (ref 1.005–1.030)

## 2015-12-05 LAB — RENAL FUNCTION PANEL
ALBUMIN: 2.9 g/dL — AB (ref 3.5–5.0)
ANION GAP: 15 (ref 5–15)
BUN: 9 mg/dL (ref 6–20)
CO2: 34 mmol/L — ABNORMAL HIGH (ref 22–32)
Calcium: 8.6 mg/dL — ABNORMAL LOW (ref 8.9–10.3)
Chloride: 85 mmol/L — ABNORMAL LOW (ref 101–111)
Creatinine, Ser: 0.76 mg/dL (ref 0.44–1.00)
Glucose, Bld: 134 mg/dL — ABNORMAL HIGH (ref 65–99)
POTASSIUM: 3.6 mmol/L (ref 3.5–5.1)
Sodium: 134 mmol/L — ABNORMAL LOW (ref 135–145)

## 2015-12-05 LAB — GLUCOSE, CAPILLARY
GLUCOSE-CAPILLARY: 193 mg/dL — AB (ref 65–99)
GLUCOSE-CAPILLARY: 119 mg/dL — AB (ref 65–99)
GLUCOSE-CAPILLARY: 133 mg/dL — AB (ref 65–99)
GLUCOSE-CAPILLARY: 157 mg/dL — AB (ref 65–99)
GLUCOSE-CAPILLARY: 152 mg/dL — AB (ref 65–99)
GLUCOSE-CAPILLARY: 199 mg/dL — AB (ref 65–99)
GLUCOSE-CAPILLARY: 123 mg/dL — AB (ref 65–99)
GLUCOSE-CAPILLARY: 138 mg/dL — AB (ref 65–99)
GLUCOSE-CAPILLARY: 140 mg/dL — AB (ref 65–99)
Glucose-Capillary: 122 mg/dL — ABNORMAL HIGH (ref 65–99)
Glucose-Capillary: 128 mg/dL — ABNORMAL HIGH (ref 65–99)
Glucose-Capillary: 129 mg/dL — ABNORMAL HIGH (ref 65–99)
Glucose-Capillary: 147 mg/dL — ABNORMAL HIGH (ref 65–99)
Glucose-Capillary: 168 mg/dL — ABNORMAL HIGH (ref 65–99)
Glucose-Capillary: 196 mg/dL — ABNORMAL HIGH (ref 65–99)
Glucose-Capillary: 214 mg/dL — ABNORMAL HIGH (ref 65–99)
Glucose-Capillary: 278 mg/dL — ABNORMAL HIGH (ref 65–99)

## 2015-12-05 LAB — CBC
HEMATOCRIT: 29.8 % — AB (ref 36.0–46.0)
HEMOGLOBIN: 9.1 g/dL — AB (ref 12.0–15.0)
MCH: 28.5 pg (ref 26.0–34.0)
MCHC: 30.5 g/dL (ref 30.0–36.0)
MCV: 93.4 fL (ref 78.0–100.0)
Platelets: 344 10*3/uL (ref 150–400)
RBC: 3.19 MIL/uL — AB (ref 3.87–5.11)
RDW: 13.3 % (ref 11.5–15.5)
WBC: 13.5 10*3/uL — ABNORMAL HIGH (ref 4.0–10.5)

## 2015-12-05 LAB — POCT I-STAT 3, ART BLOOD GAS (G3+)
Acid-Base Excess: 15 mmol/L — ABNORMAL HIGH (ref 0.0–2.0)
Bicarbonate: 42.9 mEq/L — ABNORMAL HIGH (ref 20.0–24.0)
O2 Saturation: 88 %
PH ART: 7.435 (ref 7.350–7.450)
TCO2: 45 mmol/L (ref 0–100)
pCO2 arterial: 63.4 mmHg (ref 35.0–45.0)
pO2, Arterial: 53 mmHg — ABNORMAL LOW (ref 80.0–100.0)

## 2015-12-05 LAB — LACTIC ACID, PLASMA
Lactic Acid, Venous: 4.2 mmol/L (ref 0.5–2.0)
Lactic Acid, Venous: 4.5 mmol/L (ref 0.5–2.0)
Lactic Acid, Venous: 2.1 mmol/L (ref 0.5–2.0)

## 2015-12-05 LAB — MAGNESIUM: Magnesium: 1.3 mg/dL — ABNORMAL LOW (ref 1.7–2.4)

## 2015-12-05 LAB — TRIGLYCERIDES: TRIGLYCERIDES: 30 mg/dL (ref <150)

## 2015-12-05 LAB — URINE MICROSCOPIC-ADD ON: RBC / HPF: NONE SEEN RBC/hpf (ref 0–5)

## 2015-12-05 LAB — ECHOCARDIOGRAM COMPLETE
HEIGHTINCHES: 61 in
Weight: 2432 oz

## 2015-12-05 LAB — TROPONIN I
Troponin I: 0.03 ng/mL (ref <0.031)
Troponin I: 0.04 ng/mL — ABNORMAL HIGH (ref <0.031)
Troponin I: 0.06 ng/mL — ABNORMAL HIGH (ref <0.031)

## 2015-12-05 MED ORDER — LACTATED RINGERS IV BOLUS (SEPSIS)
1000.0000 mL | Freq: Once | INTRAVENOUS | Status: AC
  Administered 2015-12-05: 1000 mL via INTRAVENOUS

## 2015-12-05 MED ORDER — SODIUM CHLORIDE 0.9% FLUSH
10.0000 mL | Freq: Two times a day (BID) | INTRAVENOUS | Status: DC
  Administered 2015-12-05 – 2015-12-08 (×8): 10 mL
  Administered 2015-12-09 – 2015-12-10 (×4): 20 mL
  Administered 2015-12-11: 10 mL
  Administered 2015-12-12: 30 mL
  Administered 2015-12-12: 20 mL
  Administered 2015-12-12: 30 mL
  Administered 2015-12-21: 10 mL

## 2015-12-05 MED ORDER — GERHARDT'S BUTT CREAM
TOPICAL_CREAM | CUTANEOUS | Status: DC | PRN
  Administered 2015-12-08: 17:00:00 via TOPICAL
  Filled 2015-12-05 (×2): qty 1

## 2015-12-05 MED ORDER — SODIUM CHLORIDE 0.9 % IV BOLUS (SEPSIS)
1000.0000 mL | Freq: Once | INTRAVENOUS | Status: AC
  Administered 2015-12-05: 1000 mL via INTRAVENOUS

## 2015-12-05 MED ORDER — SODIUM CHLORIDE 0.9 % IV BOLUS (SEPSIS)
500.0000 mL | Freq: Once | INTRAVENOUS | Status: AC
  Administered 2015-12-05: 500 mL via INTRAVENOUS

## 2015-12-05 MED ORDER — SODIUM CHLORIDE 0.9 % IV SOLN
INTRAVENOUS | Status: DC
  Administered 2015-12-05: 02:00:00 via INTRAVENOUS
  Filled 2015-12-05: qty 2.5

## 2015-12-05 MED ORDER — LEVALBUTEROL HCL 0.63 MG/3ML IN NEBU
0.6300 mg | INHALATION_SOLUTION | RESPIRATORY_TRACT | Status: DC
  Administered 2015-12-05 – 2015-12-11 (×39): 0.63 mg via RESPIRATORY_TRACT
  Filled 2015-12-05 (×39): qty 3

## 2015-12-05 MED ORDER — CHLORHEXIDINE GLUCONATE 0.12% ORAL RINSE (MEDLINE KIT)
15.0000 mL | Freq: Two times a day (BID) | OROMUCOSAL | Status: DC
  Administered 2015-12-05 – 2015-12-07 (×6): 15 mL via OROMUCOSAL

## 2015-12-05 MED ORDER — DEXTROSE 5 % IV SOLN
0.0000 ug/min | INTRAVENOUS | Status: DC
  Administered 2015-12-05: 10 ug/min via INTRAVENOUS
  Administered 2015-12-05: 5 ug/min via INTRAVENOUS
  Filled 2015-12-05: qty 1

## 2015-12-05 MED ORDER — SODIUM CHLORIDE 0.9% FLUSH
10.0000 mL | INTRAVENOUS | Status: DC | PRN
  Administered 2015-12-13 – 2015-12-14 (×2): 30 mL
  Administered 2015-12-14: 10 mL
  Administered 2015-12-16: 30 mL
  Administered 2015-12-18 (×3): 10 mL
  Filled 2015-12-05 (×7): qty 40

## 2015-12-05 MED ORDER — IPRATROPIUM BROMIDE 0.02 % IN SOLN
0.5000 mg | RESPIRATORY_TRACT | Status: DC
  Administered 2015-12-05 – 2015-12-11 (×39): 0.5 mg via RESPIRATORY_TRACT
  Filled 2015-12-05 (×39): qty 2.5

## 2015-12-05 MED ORDER — ANTISEPTIC ORAL RINSE SOLUTION (CORINZ)
7.0000 mL | OROMUCOSAL | Status: DC
  Administered 2015-12-05 – 2015-12-08 (×32): 7 mL via OROMUCOSAL

## 2015-12-05 NOTE — Progress Notes (Signed)
  Echocardiogram 2D Echocardiogram has been performed.  Leta Jungling M 12/05/2015, 9:25 AM

## 2015-12-05 NOTE — Progress Notes (Signed)
eLink Physician-Brief Progress Note Patient Name: Wendy [name] DOB: 06-25-1961 MRN: [id number]   Date of Service  12/05/2015  HPI/Events of Note  Reviewed portable chest x-ray post-line placement in left internal jugular. No evidence of pneumothorax. Tip of central venous catheter in superior vena cava.  eICU Interventions  Okay to use central line. Order placed.     Intervention Category Intermediate Interventions: Diagnostic test evaluation  Lawanda Cousins 12/05/2015, 4:52 AM

## 2015-12-05 NOTE — Progress Notes (Signed)
CRITICAL VALUE ALERT  Critical value received:  Lactic Acid 4.2  Date of notification:  12/05/15  Time of notification:  0124  Critical value read back: yes  Nurse who received alert:  Alyce Pagan RN   MD notified (1st page):  Dr. Jamison Neighbor  Also made aware of BP in 80s. Will give 1 L normal saline bolus. Will continue to closely monitor. Thresa Ross RN

## 2015-12-05 NOTE — Progress Notes (Signed)
eLink Physician-Brief Progress Note Patient Name: Wendy [name] DOB: [birth year] MRN: [id number]   Date of Service  12/05/2015  HPI/Events of Note  Reviewed ABG. Correction of hypercarbia with acceptable PaO2. Camera check on patient reveals patient comfortably sedated. Family at bedside.  Peak pressure 42-44 & plateau 30. FiO2 0.3 & saturation 92-93 percent. Respiratory rate set at 24. Remains in sinus tachycardia in the 140s. Reportedly on Xopenex at home.  eICU Interventions  1. Continuing current vent settings with goal saturation 88-94% to minimize VQ mismatching 2. Switch from albuterol to Xopenex every 4 hour. 3. continuing Atrovent every 4 hour     Intervention Category Major Interventions: Respiratory failure - evaluation and management  Lawanda Cousins 12/05/2015, 12:40 AM

## 2015-12-05 NOTE — Progress Notes (Signed)
CRITICAL VALUE ALERT  Critical value received:  Lactic 4.5   Date of notification:  12/05/15  Time of notification:  0645   Critical value read back: yes  Nurse who received alert:  A. Lamar Benes RN  MD notified (1st page):  Dr. Jamison Neighbor  No new orders at this time. Thresa Ross RN

## 2015-12-05 NOTE — Procedures (Signed)
Central Venous Catheter Insertion Procedure Note Wendy [name] [id number] 1961/02/05  Procedure: Insertion of Central Venous Catheter Indications: Drug and/or fluid administration  Procedure Details Consent: Risks of procedure as well as the alternatives and risks of each were explained to the (patient/caregiver).  Consent for procedure obtained. Time Out: Verified patient identification, verified procedure, site/side was marked, verified correct patient position, special equipment/implants available, medications/allergies/relevent history reviewed, required imaging and test results available.  Performed  Maximum sterile technique was used including antiseptics, cap, gloves, gown, hand hygiene, mask and sheet. Skin prep: Chlorhexidine; local anesthetic administered A antimicrobial bonded/coated triple lumen catheter was placed in the left internal jugular vein using the Seldinger technique under ultrasound guidance.   After local anesthesia, the finder needle was advanced into the vessel under ultrasound guidance. Dark red non-pulsatile blood was aspirated. The wire was threaded into the vessel and needle removed. The wire was confirmed in the vein with ultrasound. A nick was made in the skin and the tract was dilated. The catheter was advanced over the wire into the vessel. The wire was removed. All ports aspirated and flushed easily. The catheter was sutured in place and biopatch and dressing were applied. No complications. Post procedure CXR demonstrated the line in adequate position with the tip overlying the SVC. EBL <5cc.   Evaluation Blood flow good Complications: No apparent complications Patient did tolerate procedure well. Chest X-ray ordered to verify placement.  CXR: demonstrated the line in adequate position with the tip overlying the SVC.   Nita Sickle, MD Pulmonary and Critical Care 12/05/2015 4:31 AM

## 2015-12-05 NOTE — Progress Notes (Signed)
CRITICAL VALUE ALERT  Critical value received:  Phosphorus <1.0    Date of notification:  12/05/15  Time of notification:  0721  Critical value read back: yes   Nurse who received alert:  A. Lamar Benes RN  MD notified (1st page):  CCM pager   Time of first page:  [phone]  Dr. Molli Knock made aware. Rounding MD will address. Thresa Ross RN

## 2015-12-05 NOTE — Telephone Encounter (Signed)
Spoke with pt's daughter, Wendy [name]- states that pt is currently in hospital as of yesterday, currently on a vent.  Hospital notes in Epic.   Pt was scheduled for ov this morning with CY-this appt has been cancelled.    Forwarding to CY to make aware of pt's current status.

## 2015-12-05 NOTE — Progress Notes (Signed)
eLink Physician-Brief Progress Note Patient Name: Wendy [name] DOB: [birth year] MRN: [id number]   Date of Service  12/05/2015  HPI/Events of Note  Oliguria - Urine output about 10 mL/hour. LVEF = 60% to 65%. CVP = 10. Pulmonary Artery pressure = 33.  eICU Interventions  Will bolus with 0.9 NaCl 1 liter IV over 1 hour now.      Intervention Category Intermediate Interventions: Oliguria - evaluation and management  Sommer,Steven Eugene 12/05/2015, 10:04 PM

## 2015-12-05 NOTE — Progress Notes (Addendum)
Notified [facility] SBP 170s-180s. No new orders at this time. Will continue to monitor closely.   Per Dr. Arsenio Loader, increase propofol to help with HTN. Will continue to monitor closely.

## 2015-12-05 NOTE — H&P (Signed)
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  Had progressively worsening respiratory status and hypercarbia despite BD, steroids and ultimately required intubation in ER.    During her last admission she was seen by palliative care and started on morphine to relieve her air hunger. She was discharged on 50mg  prednisone (stop date 3/20). Her family states that they have previously discussed goals of care and that she would not want to be on mechanical ventilation for a prolonged period. She would not want tracheostomy.    SUBJECTIVE:  Worsening hypotension overnight.  CVL placed, pressors started.   VITAL SIGNS: BP 101/70 mmHg  Pulse 105  Temp(Src) 99.3 F (37.4 C) (Oral)  Resp 28  Ht 5\' 1"  (1.549 m)  Wt 68.947 kg (152 lb)  BMI 28.74 kg/m2  SpO2 94%  HEMODYNAMICS:    VENTILATOR SETTINGS: Vent Mode:  [-] PRVC FiO2 (%):  [30 %-60 %] 30 % Set Rate:  [20 bmp-24 bmp] 24 bmp Vt Set:  [380 mL-400 mL] 380 mL PEEP:  [5 cmH20] 5 cmH20 Plateau Pressure:  [23 cmH20-30 cmH20] 23 cmH20  INTAKE / OUTPUT: I/O last 3 completed shifts: In: 4043.6 [I.V.:1463.6; NG/GT:30; IV Piggyback:2550] Out: 96 [Urine:95; Stool:1]  PHYSICAL EXAMINATION: Physical Exam: Temp:  [97.2 F (36.2 C)-99.3 F (37.4 C)] 99.3 F (37.4 C) (03/20 0349) Pulse Rate:  [105-154] 105 (03/20 1102) Resp:  [14-28] 28 (03/20 1102) BP: (70-203)/(22-186) 101/70 mmHg (03/20 1102) SpO2:  [89 %-100 %] 94 % (03/20 1102) FiO2 (%):  [30 %-60 %] 30 % (03/20 1102) Weight:  [68.947 kg (152 lb)] 68.947 kg (152 lb) (03/19 1949)   General: Neuro: CV:   PULM: GI: Extremities:   LABS:  BMET  Recent Labs Lab 12/04/15 2020 12/05/15 0500  NA 134* 134*  K 4.3 3.6  CL 76* 85*  CO2 46* 34*  BUN 7 9  CREATININE 0.53 0.76  GLUCOSE 118* 134*    Electrolytes  Recent Labs Lab 12/04/15 2020 12/05/15 0500  CALCIUM 9.3 8.6*  MG  --  1.3*  PHOS  --  <1.0*    CBC  Recent Labs Lab 12/04/15 2020 12/05/15 0606  WBC 15.4* 13.5*  HGB 11.2* 9.1*  HCT 36.4 29.8*  PLT 383 344    Coag's  Recent Labs Lab 12/04/15 2020  INR 0.98    Sepsis Markers  Recent Labs Lab 12/04/15 2152 12/05/15 0013 12/05/15 0604  LATICACIDVEN 3.46* 4.2* 4.5*    ABG  Recent Labs Lab 11/29/15 1255 12/04/15 2145 12/05/15 0026  PHART 7.304* 7.214* 7.435  PCO2ART 88.9* 105* 63.4*  PO2ART 84.0 79.8* 53.0*    Liver Enzymes  Recent Labs Lab 12/05/15 0500  ALBUMIN 2.9*    Cardiac Enzymes  Recent Labs Lab 12/04/15 2020 12/05/15 0606 12/05/15 0910  TROPONINI 0.03 0.03 0.04*    Glucose  Recent Labs Lab 12/05/15 12/05/15 0229 12/05/15 0324 12/05/15 0433 12/05/15 0545 12/05/15 0651  GLUCAP 278* 196* 193* 147* 119* 133*    Imaging Dg Chest Port 1 View  12/05/2015  CLINICAL DATA:  Central line placement EXAM: PORTABLE CHEST 1 VIEW COMPARISON:  12/04/2015 FINDINGS: There is a new left jugular central line with tip in the expected location of the low SVC. There is no pneumothorax. Endotracheal tube remains satisfactorily positioned. Nasogastric tube extends into the stomach. The lungs are grossly clear. No large effusion. Hilar and mediastinal contours are unremarkable and unchanged. IMPRESSION: 1. New left jugular central line with tip in the low SVC. No pneumothorax. 2.  Support equipment appears satisfactorily positioned. Electronically Signed   By: Ellery Plunk M.D.   On: 12/05/2015 04:54   Dg Chest Port 1 View  12/04/2015  CLINICAL DATA:  Shortness of Breath EXAM: PORTABLE CHEST 1 VIEW COMPARISON:  11/23/2015  FINDINGS: Endotracheal tube has been placed and now lies 4.4 cm above the carina. The cardiac shadow is within normal limits. The lungs are well aerated bilaterally. No acute bony abnormality is seen. IMPRESSION: Endotracheal tube as described.  No acute abnormality noted. Electronically Signed   By: Alcide Clever M.D.   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 Other medical history includes diastolic heart failure, obstructive sleep apnea and rheumatoid arthritis on Humira and prednisone.   ASSESSMENT / PLAN:  PULMONARY A: Acute on chronic mixed hypercapneic/hypoxemic respiratory failure Acute exacerbation of COPD P:   Vent support - 8cc/kg  F/u CXR  F/u ABG Solumedrol  Continue albuterol prn Budesonide nebs BID abx as below for empiric rx HCAP given recent hospitalization   CARDIOVASCULAR A:  Hypotension  Sinus tachycardia CVP=10 3/20 P:  Place aline  Wean pressors as able to keep MAP >65 Consider stress steroids if increasing pressor needs - on chronic pred (currently on solumedrol)  RENAL A:   Lactic acidosis secondary to respiratory failure Hyponatremia - mild  P:   Trend lactate  GASTROINTESTINAL A:   No acute issues P:   NPO for now Place OGT Stress ulcer ppx  HEMATOLOGIC A:   No acute issues P:  F/u CBC  SQ heparin   INFECTIOUS A:   Concern for HCAP  SIRS w/o clear infectious source; possible PNA P:   Continue vancomycin and aztreonam Follow cultures, RVP   ENDOCRINE A:   DM  P:   Insulin gtt per ICU protocol   RHEUMATOLOGIC: A: Rheumatoid arthritis on Humira,  prednisone P: Hold Humira while acutely ill Prednisone as above - on chronically   NEUROLOGIC A:   No acute issues P:   RASS goal: 0 to -1 Fentanyl gtt and PRN versed for sedation/analgesia   FAMILY  - Updates: Family updated at length at bedside 3/20.  Pt was sent home with morphine after last admit, seen by palliative care but Wendy [name] still seem unclear as to how severe her lung disease is.  They reiterated that she would not want long term support or trach but remain hopeful for recovery at this point.   - Inter-disciplinary family meet or Palliative Care meeting due by:  day 7  Dirk Dress, NP 12/05/2015  11:13 AM Pager: [phone] or [phone]  Attending Note:  55 year old female with ES-COPD who was on hospice who now presents with respiratory failure and was intubated.  On exam, diffuse  wheezing noted.  Patient did very poorly on weaning trials.  Will continue abx, steroids and full vent support for now.  Conversation with the family, the Wendy [name] are unclear on severity of lung disease.  She would not want long term mechanical support but will need to discuss terminal extubation at this point.  Will discuss further in AM.  The patient is critically ill with multiple organ systems failure and requires high complexity decision making for assessment and support, frequent evaluation and titration of therapies, application of advanced monitoring technologies and extensive interpretation of multiple databases.   Critical Care Time devoted to patient care services described in this note is  35  Minutes. This time reflects time of care of this signee Dr Koren Bound. This critical care time does not reflect procedure time, or teaching time or supervisory time of PA/NP/Med student/Med Resident etc but could involve care discussion time.  Alyson Reedy, M.D. [facility] Pulmonary/Critical Care Medicine. Pager: [phone]. After hours pager: [phone].

## 2015-12-05 NOTE — Progress Notes (Signed)
Wasted 40 mL Propofol in trash. Witnessed by Aviva Signs RN. Thresa Ross RN

## 2015-12-05 NOTE — Progress Notes (Addendum)
CRITICAL VALUE ALERT  Critical value received:  Lactic acid 2.1  Date of notification:  12/05/15  Time of notification:  1248  Critical value read back: yes  Nurse who received alert:  Andreas Blower RN  MD notified (1st page):  Dr. Molli Knock  Time of first page:  1250  Notified Dr. Molli Knock. No new orders at this time. Will continue to monitor closely.

## 2015-12-05 NOTE — Progress Notes (Signed)
Initial Nutrition Assessment  DOCUMENTATION CODES:   Not applicable  INTERVENTION:   - If patient is unable to be extubated, recommend initiating enteral nutrition with Vital AF 1.2 at 20 ml/hr to increase by 10 ml every 4 hours to a goal rate of 50 ml/hr. Provide 30 ml Prostat one time per day for an additional 100 kcal and 15 grams of protein.  Tube feeding regimen will provide 1540 kcals, 105 grams of protein, and 973 ml of free water.   NUTRITION DIAGNOSIS:   Inadequate oral intake related to inability to eat as evidenced by NPO status.  GOAL:   Patient will meet greater than or equal to 90% of their needs  MONITOR:   Labs, Weight trends, I & O's, Vent status  REASON FOR ASSESSMENT:   Ventilator    ASSESSMENT:   55 year old female w/ end stage COPD admitted with acute on chronic hypercapneic respiratory failure requiring intubation. Other medical history includes diastolic heart failure, obstructive sleep apnea and rheumatoid arthritis on Humira and prednisone.  Spoke with family who report that patient has not been consuming any food for a few weeks now, including during her hospitalization about a week ago.  Family reports weight stability.  Patient is currently intubated on ventilator support. MV: 11 ml/min Temp (24hrs), Avg:98.6 F (37 C), Min:97.2 F (36.2 C), Max:99.3 F (37.4 C) Propofol: none OG Tube, tip in stomach  Nutrition Focused Physical Exam was conducted.  Findings include no fat depletion, no muscle depletion, and no edema.  Medications reviewed: novolog.  Labs reviewed: CBGs (119-133), low sodium (134), low phosphorus (<1.0), low magnesium (1.3).  Diet Order:  Diet NPO time specified  Skin:  Reviewed, no issues  Last BM:  3/19  Height:   Ht Readings from Last 1 Encounters:  12/04/15 5\' 1"  (1.549 m)    Weight:   Wt Readings from Last 1 Encounters:  12/04/15 152 lb (68.947 kg)    Ideal Body Weight:  47.7 kg  BMI:  Body mass index  is 28.74 kg/(m^2).  Estimated Nutritional Needs:   Kcal:  1542  Protein:  100-110 grams  Fluid:  >/= 1.5 L  EDUCATION NEEDS:   No education needs identified at this time  12/06/15, Dietetic Intern Pager: [phone]

## 2015-12-05 NOTE — Progress Notes (Addendum)
RT NOTE:  Critical ABG: CO2  ISTAT RN NOTIFIED.   Dr Jamison Neighbor agreed with current vent settings.

## 2015-12-05 NOTE — Procedures (Signed)
Arterial Catheter Insertion Procedure Note Wendy [name] [id number] 05/18/1961  Procedure: Insertion of Arterial Catheter  Indications: Blood pressure monitoring and Frequent blood sampling  Procedure Details Consent: Risks of procedure as well as the alternatives and risks of each were explained to the (patient/caregiver).  Consent for procedure obtained. Time Out: Verified patient identification, verified procedure, site/side was marked, verified correct patient position, special equipment/implants available, medications/allergies/relevent history reviewed, required imaging and test results available.  Performed  Maximum sterile technique was used including cap, gloves, gown, hand hygiene, mask and sheet. Skin prep: Chlorhexidine; local anesthetic administered 20 gauge catheter was inserted into left radial artery using the Seldinger technique.  Evaluation Blood flow good; BP tracing good. Complications: No apparent complications .   Carlynn Spry 12/05/2015

## 2015-12-05 NOTE — Progress Notes (Signed)
eLink Physician-Brief Progress Note Patient Name: Wendy [name] DOB: Oct 06, 1960 MRN: [id number]   Date of Service  12/05/2015  HPI/Events of Note  Notified by bedside nurse of slight rise in lactic acidosis. Blood pressure Somewhat marginal with systolic blood pressure 88 & mean arterial pressure 80.  eICU Interventions  1. Continuing to trend lactic acid 2. Lactated Ringer bolus 1 L IV over 1 hour     Intervention Category Major Interventions: Acid-Base disturbance - evaluation and management  Lawanda Cousins 12/05/2015, 1:26 AM

## 2015-12-05 NOTE — Progress Notes (Signed)
eLink Physician-Brief Progress Note Patient Name: Wendy [name] DOB: [birth year] MRN: [id number]   Date of Service  12/05/2015  HPI/Events of Note  Bedside nurse notified of slowly worsening hypotension and shock. Tachycardia mildly improved.  Status post approximately 1.6 L IV fluid. Has 3 peripheral IVs for access.  eICU Interventions  1. Plan for central line placement as soon as possible for additional IV and central access 2. Neo-Synephrine low-dose IV to maintain SBP & MAP. 3. 1 L lactated ringer IV fluid bolus over one hour 4. Ordering transthoracic echocardiogram 5. Checking troponin I & repeat every 6 hours 3     Intervention Category Major Interventions: Shock - evaluation and management  Lawanda Cousins 12/05/2015, 3:36 AM

## 2015-12-05 NOTE — Progress Notes (Signed)
Inpatient Diabetes Program Recommendations  AACE/ADA: New Consensus Statement on Inpatient Glycemic Control (2015)  Target Ranges:  Prepandial:   less than 140 mg/dL      Peak postprandial:   less than 180 mg/dL (1-2 hours)      Critically ill patients:  140 - 180 mg/dL  Results for RENARDA, MULLINIX (MRN [id number]) as of 12/05/2015 10:41  Ref. Range 12/05/2015 02:29 12/05/2015 03:24 12/05/2015 04:33 12/05/2015 05:45 12/05/2015 06:51  Glucose-Capillary Latest Ref Range: 65-99 mg/dL 967 (H) 893 (H) 810 (H) 119 (H) 133 (H)   Review of Glycemic Control  Diabetes history: No hx. Noted A1c 5.6 Outpatient Diabetes medications:None Current orders for Inpatient glycemic control:  Insulin drip protocol + Novolog SSI 0-9 Sensitive scale.   Inpatient Diabetes Program Recommendations:  No Recommendations at this time. Will continue to follow.   Thank you, Billy Fischer. Indica Marcott, RN, MSN, CDE Inpatient Glycemic Control Team Team Pager [phone] (8am-5pm) 12/05/2015 10:46 AM

## 2015-12-05 NOTE — Progress Notes (Signed)
eLink Physician-Brief Progress Note Patient Name: Wendy [name] DOB: 06-Dec-1960 MRN: [id number]   Date of Service  12/05/2015  HPI/Events of Note  Notified by bedside nurse of elevated lactic acid 3.46. Patient continues to have sinus tachycardia to approximately 150. Reportedly more sedate and synchronous with the vent at this time. Review of labs shows hypercarbic respiratory failure with acidosis post intubation. Currently saturating 90% on FiO2 0.3. FEV1 19% predicted on previous spirometry. Nurse reports patient did have what appear as purulent urine when Foley was placed.  eICU Interventions  1. Stat ABG & possible ventilator changes depending upon this result 2. Normal saline 500 mL IV over 30 minutes 1 3. Discontinuing budesonide nebs 4. Continuing Duonebs & consider switching to Xopenex if sinus tachycardia persists 5. Respiratory viral panel PCR switched to tracheal aspirate 6. Urine culture     Intervention Category Major Interventions: Sepsis - evaluation and management;Respiratory failure - evaluation and management  Lawanda Cousins 12/05/2015, 12:18 AM

## 2015-12-05 NOTE — Progress Notes (Signed)
eLink Physician-Brief Progress Note Patient Name: Wendy [name] DOB: [birth year] MRN: [id number]   Date of Service  12/05/2015  HPI/Events of Note  Last blood glucose = 140.   eICU Interventions  Will order: 1. D/C Insulin IV infusion.  2. Restart Q 4 hour Novolog SSI.     Intervention Category Intermediate Interventions: Hyperglycemia - evaluation and treatment  [name] 12/05/2015, 6:12 PM

## 2015-12-06 ENCOUNTER — Inpatient Hospital Stay (HOSPITAL_COMMUNITY): Payer: Medicare Other

## 2015-12-06 DIAGNOSIS — J9620 Acute and chronic respiratory failure, unspecified whether with hypoxia or hypercapnia: Secondary | ICD-10-CM

## 2015-12-06 LAB — BASIC METABOLIC PANEL
ANION GAP: 7 (ref 5–15)
BUN: 13 mg/dL (ref 6–20)
CHLORIDE: 92 mmol/L — AB (ref 101–111)
CO2: 34 mmol/L — ABNORMAL HIGH (ref 22–32)
Calcium: 8.1 mg/dL — ABNORMAL LOW (ref 8.9–10.3)
Creatinine, Ser: 0.73 mg/dL (ref 0.44–1.00)
GFR calc Af Amer: >60 mL/min (ref >60)
Glucose, Bld: 124 mg/dL — ABNORMAL HIGH (ref 65–99)
POTASSIUM: 3.5 mmol/L (ref 3.5–5.1)
SODIUM: 133 mmol/L — AB (ref 135–145)

## 2015-12-06 LAB — URINE CULTURE

## 2015-12-06 LAB — GLUCOSE, CAPILLARY
GLUCOSE-CAPILLARY: 120 mg/dL — AB (ref 65–99)
GLUCOSE-CAPILLARY: 123 mg/dL — AB (ref 65–99)
GLUCOSE-CAPILLARY: 135 mg/dL — AB (ref 65–99)
Glucose-Capillary: 143 mg/dL — ABNORMAL HIGH (ref 65–99)
Glucose-Capillary: 151 mg/dL — ABNORMAL HIGH (ref 65–99)

## 2015-12-06 LAB — CBC
HEMATOCRIT: 28.5 % — AB (ref 36.0–46.0)
HEMOGLOBIN: 8.8 g/dL — AB (ref 12.0–15.0)
MCH: 28.4 pg (ref 26.0–34.0)
MCHC: 30.9 g/dL (ref 30.0–36.0)
MCV: 91.9 fL (ref 78.0–100.0)
Platelets: 324 10*3/uL (ref 150–400)
RBC: 3.1 MIL/uL — AB (ref 3.87–5.11)
RDW: 13.6 % (ref 11.5–15.5)
WBC: 19.1 10*3/uL — AB (ref 4.0–10.5)

## 2015-12-06 LAB — TRIGLYCERIDES: TRIGLYCERIDES: 42 mg/dL (ref <150)

## 2015-12-06 MED ORDER — VITAL AF 1.2 CAL PO LIQD: 1000.0000 mL | ORAL | Status: DC

## 2015-12-06 MED ORDER — ALPRAZOLAM 0.5 MG PO TABS
1.0000 mg | ORAL_TABLET | Freq: Two times a day (BID) | ORAL | Status: DC
  Administered 2015-12-06 – 2015-12-13 (×15): 1 mg via ORAL
  Filled 2015-12-06 (×15): qty 2

## 2015-12-06 MED ORDER — MAGNESIUM SULFATE 4 GM/100ML IV SOLN
4.0000 g | Freq: Once | INTRAVENOUS | Status: AC
  Administered 2015-12-06: 4 g via INTRAVENOUS
  Filled 2015-12-06: qty 100

## 2015-12-06 MED ORDER — HYDRALAZINE HCL 20 MG/ML IJ SOLN
10.0000 mg | INTRAMUSCULAR | Status: DC | PRN
  Administered 2015-12-06: 20 mg via INTRAVENOUS
  Administered 2015-12-07 – 2015-12-09 (×2): 10 mg via INTRAVENOUS
  Administered 2015-12-10: 20 mg via INTRAVENOUS
  Administered 2015-12-19: 30 mg via INTRAVENOUS
  Administered 2015-12-20: 20 mg via INTRAVENOUS
  Filled 2015-12-06 (×3): qty 1
  Filled 2015-12-06: qty 2
  Filled 2015-12-06: qty 1

## 2015-12-06 MED ORDER — POTASSIUM PHOSPHATES 15 MMOLE/5ML IV SOLN
24.0000 mmol | Freq: Once | INTRAVENOUS | Status: AC
  Administered 2015-12-06: 24 mmol via INTRAVENOUS
  Filled 2015-12-06: qty 8

## 2015-12-06 MED ORDER — VITAL HIGH PROTEIN PO LIQD
1000.0000 mL | ORAL | Status: DC
  Administered 2015-12-06: 1000 mL

## 2015-12-06 NOTE — Progress Notes (Signed)
Pt having tremors with HR up to 150s and BP 176/86. Per pts daughter she takes xanax tid. Notified [facility]. Per MD will restart xanax bid for now. Will continue to monitor closely.

## 2015-12-06 NOTE — Progress Notes (Signed)
eLink Physician-Brief Progress Note Patient Name: Wendy [name] DOB: [birth year] MRN: [id number]   Date of Service  12/06/2015  HPI/Events of Note  Anxeity/agitation, takes 1 mg xanax tid at home.   eICU Interventions  Started xanax at 1mg  bid.      Intervention Category Minor Interventions: Agitation / anxiety - evaluation and management  12/06/2015, 4:52 PM

## 2015-12-06 NOTE — H&P (Addendum)
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 Annamaria Boots, who presented 3/19 with shortness of breath.  Had progressively worsening respiratory status and hypercarbia despite BD, steroids and ultimately required intubation in ER.    During her last admission she was seen by palliative care and started on morphine to relieve her air hunger. She was discharged on 86m prednisone (stop date 3/20). Her family states that they have previously discussed goals of care and that she would not want to be on mechanical ventilation for a prolonged period. She would not want tracheostomy.     STUDIES:  2D echo 3/20>>> EF 60-65%,  PASP [id number] grade 1 diastolic dysfunction   CULTURES: Blood 3/19>>>   ANTIBIOTICS: Vanc 3/19>>> Aztreonam 3/19>>>  SIGNIFICANT EVENTS:  LINES/TUBES: ETT 3/19>>> L IJ CVL 3/19>>>   EVENTS 12/05/15 - Worsening hypotension overnight.  CVL placed, pressors started.    SUBJECTIVE/OVERNIGHT/INTERVAL HX 12/06/15 - off pressors. Did not tolerate PSV yesterday. On fent gtt and diprivan gtt ->  Desaturated and could not do SBT. Following commands otherwise  VITAL SIGNS: BP 156/86 mmHg  Pulse 112  Temp(Src) 98.7 F (37.1 C) (Axillary)  Resp 24  Ht _0  (1.549 m)  Wt 71.2 kg (156 lb 15.5 oz)  BMI 29.67 kg/m2  SpO2 95%  HEMODYNAMICS: CVP:  [9 mmHg-10 mmHg] 9 mmHg  VENTILATOR SETTINGS: Vent Mode:  [-] PRVC FiO2 (%):  [30 %] 30 % Set Rate:  [24 bmp] 24 bmp Vt Set:  [380 mL] 380 mL PEEP:  [5 cmH20] 5 cmH20 Plateau Pressure:  [23 cmH20-35 cmH20] 29 cmH20  INTAKE / OUTPUT: I/O last 3 completed shifts: In: 7662.9 [I.V.:3502.9; Other:10; NG/GT:150; IV Piggyback:4000] Out: 596  [Urine:595; Stool:1]  PHYSICAL EXAMINATION: Physical Exam: Temp:  [98.3 F (36.8 C)-99.1 F (37.3 C)] 98.7 F (37.1 C) (03/21 0908) Pulse Rate:  [83-157] 112 (03/21 0915) Resp:  [16-29] 24 (03/21 0915) BP: (80-199)/(62-117) 156/86 mmHg (03/21 0915) SpO2:  [86 %-99 %] 95 % (03/21 0915) Arterial Line BP: (73-218)/(51-148) 96/87 mmHg (03/21 0915) FiO2 (%):  [30 %] 30 % (03/21 0720) Weight:  [71.2 kg (156 lb 15.5 oz)] 71.2 kg (156 lb 15.5 oz) (03/21 0418)   General: Neuro:  rass -1 and nods appropriately and moves all 4s CV: normal heart sounds PULM: CTA bialteally. Sync with vent but on very high sedation gtt. No wheeze. Barrell chest GPP:IRJJabd Extremities: no edema   LABS:  PULMONARY  Recent Labs Lab 11/29/15 1255 12/04/15 2145 12/05/15 0026  PHART 7.304* 7.214* 7.435  PCO2ART 88.9* 105* 63.4*  PO2ART 84.0 79.8* 53.0*  HCO3 42.8* 41.0* 42.9*  TCO2 45.6 44.3 45  O2SAT 94.1 93.4 88.0    CBC  Recent Labs Lab 12/04/15 2020 12/05/15 0606 12/06/15 0416  HGB 11.2* 9.1* 8.8*  HCT 36.4 29.8* 28.5*  WBC 15.4* 13.5* 19.1*  PLT 383 344 324    COAGULATION  Recent Labs Lab 12/04/15 2020  INR 0.98    CARDIAC   Recent Labs Lab 12/04/15 2020 12/05/15 0606 12/05/15 0910 12/05/15 1530  TROPONINI 0.03 0.03 0.04* 0.06*   No results for input(s): PROBNP in the last 168 hours.   CHEMISTRY  Recent Labs  Lab 12/04/15 2020 12/05/15 0500 12/06/15 0416  NA 134* 134* 133*  K 4.3 3.6 3.5  CL 76* 85* 92*  CO2 46* 34* 34*  GLUCOSE 118* 134* 124*  BUN _0 CREATININE 0.53 0.76 0.73  CALCIUM 9.3 8.6* 8.1*  MG  --  1.3*  --   PHOS  --  <1.0*  --    Estimated Creatinine Clearance: 70.1 mL/min (by C-G formula based on Cr of 0.73).   LIVER  Recent Labs Lab 12/04/15 2020 12/05/15 0500  ALBUMIN  --  2.9*  INR 0.98  --      INFECTIOUS  Recent Labs Lab 12/05/15 0013 12/05/15 0604 12/05/15 1200  LATICACIDVEN 4.2* 4.5* 2.1*      ENDOCRINE CBG (last 3)   Recent Labs  12/06/15 0040 12/06/15 0346 12/06/15 0759  GLUCAP 135* 120* 143*         IMAGING x48h  - image(s) personally visualized  -   highlighted in bold Dg Chest Port 1 View  12/06/2015  CLINICAL DATA:  Respiratory failure, intubated EXAM: PORTABLE CHEST 1 VIEW COMPARISON:  12/05/2015 FINDINGS: Cardiomediastinal silhouette is stable. Stable endotracheal and NG tube position. Left IJ central line with tip in lower SVC again noted. No infiltrate or pulmonary edema. No pneumothorax. IMPRESSION: No active disease.  Stable support apparatus.  No pneumothorax. Electronically Signed   By: Lahoma Crocker M.D.   On: 12/06/2015 07:59   Dg Chest Port 1 View  12/05/2015  CLINICAL DATA:  Central line placement EXAM: PORTABLE CHEST 1 VIEW COMPARISON:  12/04/2015 FINDINGS: There is a new left jugular central line with tip in the expected location of the low SVC. There is no pneumothorax. Endotracheal tube remains satisfactorily positioned. Nasogastric tube extends into the stomach. The lungs are grossly clear. No large effusion. Hilar and mediastinal contours are unremarkable and unchanged. IMPRESSION: 1. New left jugular central line with tip in the low SVC. No pneumothorax. 2.  Support equipment appears satisfactorily positioned. Electronically Signed   By: Andreas Newport M.D.   On: 12/05/2015 04:54   Dg Chest Port 1 View  12/04/2015  CLINICAL DATA:  Shortness of Breath EXAM: PORTABLE CHEST 1 VIEW COMPARISON:  11/23/2015 FINDINGS: Endotracheal tube has been placed and now lies 4.4 cm above the carina. The cardiac shadow is within normal limits. The lungs are well aerated bilaterally. No acute bony abnormality is seen. IMPRESSION: Endotracheal tube as described.  No acute abnormality noted. Electronically Signed   By: Inez Catalina M.D.   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 Other medical history includes diastolic heart failure, obstructive sleep apnea and rheumatoid arthritis on Humira and prednisone.   55 year old female with ES-COPD who was on hospice who now presents with respiratory failure and was intubated.  On exam, diffuse wheezing noted.  Patient did very poorly on weaning trials.  Will continue abx, steroids and full vent support for now.  Conversation with the family, the daughters are unclear on severity of lung disease.  She would not want long term mechanical support but will need to discuss terminal extubation at this point   ASSESSMENT / PLAN:  PULMONARY A: Acute on chronic mixed hypercapneic/hypoxemic respiratory failure Acute exacerbation of COPD   - failed SBT  P:   Vent support - 8cc/kg  Solumedrol  Continue albuterol prn Budesonide nebs BID abx as below for empiric rx HCAP given recent hospitalization   CARDIOVASCULAR A:  Hypotension  Sinus tachycardia CVP=10 3/20   - off pressors P:  Dc  aline  Goal MAP >65 Consider stress steroids if increasing pressor needs - on chronic pred (currently on solumedrol)  RENAL A:   Lactic acidosis secondary to respiratory failure Critically low mag and phos   P:   Trend lactate rplete mag and phos 3/21  GASTROINTESTINAL A:   No acute issues  P:   NPO for now Place OGT -TF consult 12/06/15 Stress ulcer ppx  HEMATOLOGIC A:   No acute issues P:  F/u CBC  SQ heparin   INFECTIOUS A:   Concern for HCAP  SIRS w/o clear infectious source; possible PNA P:   Continue vancomycin and aztreonam Follow cultures, RVP   ENDOCRINE A:   DM  P:   Insulin gtt per ICU protocol   RHEUMATOLOGIC: A: Rheumatoid arthritis on Humira, prednisone P: Hold Humira while acutely ill Prednisone as above - on chronically   NEUROLOGIC A:   No acute issues P:   RASS goal: 0 to -1 Fentanyl gtt and PRN versed for  sedation/analgesia   FAMILY  - Updates: Family updated at length at bedside 3/20.  Pt was sent home with morphine after last admit, seen by palliative care but daughters still seem unclear as to how severe her lung disease is.  They reiterated that she would not want long term support or trach but remain hopeful for recovery at this point.  MEt with son and daughter 12/06/15 - daughter said patient would not want to be on vent past few to several days per her wish. However, son felt that patient should pull through and stated example of his ? Significant other dad who went through same experience and was then going up [place] family meet or Palliative Care meeting due by:  day 7  =- done at admit 12/05/15     The patient is critically ill with multiple organ systems failure and requires high complexity decision making for assessment and support, frequent evaluation and titration of therapies, application of advanced monitoring technologies and extensive interpretation of multiple databases.   Critical Care Time devoted to patient care services described in this note is  30  Minutes. This time reflects time of care of this signee Dr Brand Males. This critical care time does not reflect procedure time, or teaching time or supervisory time of PA/NP/Med student/Med Resident etc but could involve care discussion time    Dr. Brand Males, M.D., [facility].C.P Pulmonary and Critical Care Medicine Staff Physician [place] [facility] Pager: [phone], If no answer or between  15:00h - 7:00h: call [phone]  12/06/2015 9:40 AM

## 2015-12-06 NOTE — Progress Notes (Deleted)
Nutrition Consult/Brief Note  RD consulted for TF initiation & management.  Initial nutrition assessment completed 3/21.  Will order Vital AF 1.2 formula at goal rate of 50 ml/hr with Prostat liquid protein 30 ml daily to provide 1540 kcals, 105 gm protein, 973 ml of free water daily.  RD to continue to follow.  Maureen Chatters, RD, LDN Pager #: [phone] After-Hours Pager #: [phone]

## 2015-12-06 NOTE — Progress Notes (Signed)
[facility]  Patient Status: Active (receiving services up to time of hospitalization)  [facility] is providing the following services: RN and PT  If patient discharges after hours, please call [phone].   Wendy [name] 12/06/2015, 11:12 AM

## 2015-12-06 NOTE — Progress Notes (Signed)
Nutrition Consult/Follow Up  DOCUMENTATION CODES:   Not applicable  INTERVENTION:   Initiate TF via OGT with Vital High Protein formula at 20 ml/hr and increase by 10 ml every 4 hours to goal rate of 50 ml/hr   TF regimen + current Propofol infusion to provide 1472 kcals, 105 gm protein, 1003 ml free water daily  NUTRITION DIAGNOSIS:   Inadequate oral intake related to inability to eat as evidenced by NPO status, ongoing  GOAL:   Patient will meet greater than or equal to 90% of their needs, progressing  MONITOR:   TF tolerance, Vent status, Labs, Weight trends  ASSESSMENT:   55 year old female w/ end stage COPD admitted with acute on chronic hypercapneic respiratory failure requiring intubation. Other medical history includes diastolic heart failure, obstructive sleep apnea and rheumatoid arthritis on Humira and prednisone.  Patient is currently intubated on ventilator support -- OGT in place MV: 9.5 L/min Temp (24hrs), Avg:98.6 F (37 C), Min:98.1 F (36.7 C), Max:99.1 F (37.3 C)   Propofol: 10.3 ml/hr ----> 272 fat kcals   RD consulted for TF initiation & management.  Diet Order:  Diet NPO time specified  Skin:  Reviewed, no issues  Last BM:  3/19  Height:   Ht Readings from Last 1 Encounters:  12/04/15 5\' 1"  (1.549 m)    Weight:   Wt Readings from Last 1 Encounters:  12/06/15 156 lb 15.5 oz (71.2 kg)    Ideal Body Weight:  47.7 kg  BMI:  Body mass index is 29.67 kg/(m^2).  Estimated Nutritional Needs:   Kcal:  1492  Protein:  100-110 gm  Fluid:  >/= 1.5 L  EDUCATION NEEDS:   No education needs identified at this time  12/08/15, RD, LDN Pager #: [phone] After-Hours Pager #: [phone]

## 2015-12-07 LAB — GLUCOSE, CAPILLARY
GLUCOSE-CAPILLARY: 142 mg/dL — AB (ref 65–99)
GLUCOSE-CAPILLARY: 165 mg/dL — AB (ref 65–99)
GLUCOSE-CAPILLARY: 98 mg/dL (ref 65–99)
Glucose-Capillary: 112 mg/dL — ABNORMAL HIGH (ref 65–99)
Glucose-Capillary: 120 mg/dL — ABNORMAL HIGH (ref 65–99)
Glucose-Capillary: 126 mg/dL — ABNORMAL HIGH (ref 65–99)

## 2015-12-07 LAB — BASIC METABOLIC PANEL
Anion gap: 4 — ABNORMAL LOW (ref 5–15)
BUN: 17 mg/dL (ref 6–20)
CALCIUM: 8 mg/dL — AB (ref 8.9–10.3)
CHLORIDE: 92 mmol/L — AB (ref 101–111)
CO2: 33 mmol/L — ABNORMAL HIGH (ref 22–32)
CREATININE: 0.68 mg/dL (ref 0.44–1.00)
Glucose, Bld: 120 mg/dL — ABNORMAL HIGH (ref 65–99)
Potassium: 3.8 mmol/L (ref 3.5–5.1)
SODIUM: 129 mmol/L — AB (ref 135–145)

## 2015-12-07 LAB — CBC WITH DIFFERENTIAL/PLATELET
BASOS PCT: 0 %
Basophils Absolute: 0 10*3/uL (ref 0.0–0.1)
EOS ABS: 0 10*3/uL (ref 0.0–0.7)
EOS PCT: 0 %
HCT: 26.5 % — ABNORMAL LOW (ref 36.0–46.0)
Hemoglobin: 8.2 g/dL — ABNORMAL LOW (ref 12.0–15.0)
LYMPHS ABS: 0.7 10*3/uL (ref 0.7–4.0)
Lymphocytes Relative: 5 %
MCH: 28.3 pg (ref 26.0–34.0)
MCHC: 30.9 g/dL (ref 30.0–36.0)
MCV: 91.4 fL (ref 78.0–100.0)
MONO ABS: 0.3 10*3/uL (ref 0.1–1.0)
MONOS PCT: 2 %
NEUTROS PCT: 93 %
Neutro Abs: 14 10*3/uL — ABNORMAL HIGH (ref 1.7–7.7)
PLATELETS: 340 10*3/uL (ref 150–400)
RBC: 2.9 MIL/uL — ABNORMAL LOW (ref 3.87–5.11)
RDW: 13.8 % (ref 11.5–15.5)
WBC: 15 10*3/uL — ABNORMAL HIGH (ref 4.0–10.5)

## 2015-12-07 LAB — RESPIRATORY VIRUS PANEL
ADENOVIRUS: NEGATIVE
Influenza A: POSITIVE — AB
Influenza B: NEGATIVE
METAPNEUMOVIRUS: NEGATIVE
PARAINFLUENZA 3 A: NEGATIVE
Parainfluenza 1: NEGATIVE
Parainfluenza 2: NEGATIVE
RESPIRATORY SYNCYTIAL VIRUS A: NEGATIVE
Respiratory Syncytial Virus B: NEGATIVE
Rhinovirus: NEGATIVE

## 2015-12-07 LAB — TRIGLYCERIDES: TRIGLYCERIDES: 47 mg/dL (ref <150)

## 2015-12-07 LAB — MAGNESIUM: MAGNESIUM: 2.2 mg/dL (ref 1.7–2.4)

## 2015-12-07 LAB — PHOSPHORUS: Phosphorus: 4 mg/dL (ref 2.5–4.6)

## 2015-12-07 LAB — LACTIC ACID, PLASMA: LACTIC ACID, VENOUS: 0.7 mmol/L (ref 0.5–2.0)

## 2015-12-07 MED ORDER — MORPHINE SULFATE (PF) 2 MG/ML IV SOLN
INTRAVENOUS | Status: AC
  Administered 2015-12-07: 2 mg via INTRAVENOUS
  Filled 2015-12-07: qty 1

## 2015-12-07 MED ORDER — CETYLPYRIDINIUM CHLORIDE 0.05 % MT LIQD
7.0000 mL | Freq: Two times a day (BID) | OROMUCOSAL | Status: DC
  Administered 2015-12-08 – 2015-12-20 (×16): 7 mL via OROMUCOSAL

## 2015-12-07 MED ORDER — FENTANYL CITRATE (PF) 100 MCG/2ML IJ SOLN: 25.0000 ug | INTRAMUSCULAR | Status: DC | PRN

## 2015-12-07 MED ORDER — OSELTAMIVIR PHOSPHATE 75 MG PO CAPS
75.0000 mg | ORAL_CAPSULE | Freq: Two times a day (BID) | ORAL | Status: DC
  Administered 2015-12-07 – 2015-12-08 (×3): 75 mg via ORAL
  Filled 2015-12-07 (×6): qty 1

## 2015-12-07 MED ORDER — OSELTAMIVIR PHOSPHATE 6 MG/ML PO SUSR
75.0000 mg | Freq: Two times a day (BID) | ORAL | Status: DC
  Administered 2015-12-07 (×2): 75 mg via ORAL
  Filled 2015-12-07 (×2): qty 12.5

## 2015-12-07 MED ORDER — DEXMEDETOMIDINE HCL IN NACL 200 MCG/50ML IV SOLN
0.4000 ug/kg/h | INTRAVENOUS | Status: DC
  Administered 2015-12-07 – 2015-12-08 (×2): 1 ug/kg/h via INTRAVENOUS
  Administered 2015-12-08: 0.4 ug/kg/h via INTRAVENOUS
  Filled 2015-12-07: qty 100
  Filled 2015-12-07: qty 50

## 2015-12-07 MED ORDER — CHLORHEXIDINE GLUCONATE 0.12 % MT SOLN
15.0000 mL | Freq: Two times a day (BID) | OROMUCOSAL | Status: DC
  Administered 2015-12-07 – 2015-12-21 (×28): 15 mL via OROMUCOSAL
  Filled 2015-12-07 (×26): qty 15

## 2015-12-07 MED ORDER — MORPHINE SULFATE (PF) 2 MG/ML IV SOLN
2.0000 mg | INTRAVENOUS | Status: DC | PRN
  Administered 2015-12-07 – 2015-12-08 (×2): 2 mg via INTRAVENOUS
  Filled 2015-12-07: qty 1

## 2015-12-07 NOTE — Progress Notes (Signed)
Placed pt on BiPAP at this time per pt request

## 2015-12-07 NOTE — Progress Notes (Signed)
eLink Physician-Brief Progress Note Patient Name: Wendy [name] DOB: [birth year] MRN: [id number]   Date of Service  12/07/2015  HPI/Events of Note  Patient extubated this AM, now with acute resp distress-camera check-patient with increased WOB increased SOB  eICU Interventions  1.will try morphine, precedex for agitation and start BiPAP 2.APP notified 3.will need intubation if biPAP fails, follow up ABG       Intervention Category Major Interventions: Respiratory failure - evaluation and management  Erin Fulling 12/07/2015, 11:33 PM

## 2015-12-07 NOTE — Progress Notes (Signed)
PULMONARY / CRITICAL CARE MEDICINE   Name: Wendy [name] MRN: [id number] DOB: [birth year]    ADMISSION DATE:  12/04/2015 CONSULTATION DATE:  12/04/15  REFERRING MD:  EDP  CHIEF COMPLAINT:  Difficulty breathing  BRIEF Ms. 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 Annamaria Boots, who presented 3/19 with shortness of breath.  Had progressively worsening respiratory status and hypercarbia despite BD, steroids and ultimately required intubation in ER.    During her last admission she was seen by palliative care and started on morphine to relieve her air hunger. She was discharged on 72m prednisone (stop date 3/20). Her family states that they have previously discussed goals of care and that she would not want to be on mechanical ventilation for a prolonged period. She would not want tracheostomy.     STUDIES:  2D echo 3/20>>> EF 60-65%,  PASP 328BTDV grade 1 diastolic dysfunction   CULTURES: Blood 3/19>>> Blood 3/20 Urine 3/20 - no growth RVP 3/20 - nasal - Flu A + Trach aspirate bact 3/20 -      ANTIBIOTICS: Vanc 3/19>>> 3/22 Aztreonam 3/19>>> Tamiflu 3/22 >>   SIGNIFICANT EVENTS:  LINES/TUBES: ETT 3/19>>> L IJ CVL 3/19>>>   EVENTS 12/05/15 - Worsening hypotension overnight.  CVL placed, pressors started.    12/06/15 - off pressors. Did not tolerate PSV yesterday. On fent gtt and diprivan gtt ->  Desaturated and could not do SBT. Following commands otherwise   SUBJECTIVE/OVERNIGHT/INTERVAL HX 12/07/15 - went apneic on sbt per RRT but that was on dioprivan.  Flu A positive on PCR. Needed restart home xanax last night. WUA in progress right now  VITAL SIGNS: BP 116/74 mmHg  Pulse 89  Temp(Src) 99.1 F (37.3 C) (Oral)  Resp 24  Ht _0  (1.549 m)  Wt 71.1 kg (156 lb 12 oz)  BMI 29.63 kg/m2  SpO2 93%  HEMODYNAMICS:    VENTILATOR SETTINGS: Vent Mode:  [-] PRVC FiO2 (%):  [30 %-40 %] 40 % Set Rate:   [24 bmp] 24 bmp Vt Set:  [380 mL] 380 mL PEEP:  [5 cmH20] 5 cmH20 Plateau Pressure:  [25 cmH20-32 cmH20] 28 cmH20  INTAKE / OUTPUT: I/O last 3 completed shifts: In: 5853.9 [I.V.:2872.5; NG/GT:681.3; IV Piggyback:2300] Out: 1390 [Urine:1390]  PHYSICAL EXAMINATION: Physical Exam: Temp:  [98.1 F (36.7 C)-99.4 F (37.4 C)] 99.1 F (37.3 C) (03/22 0400) Pulse Rate:  [86-149] 89 (03/22 0700) Resp:  [14-35] 24 (03/22 0700) BP: (87-198)/(58-114) 116/74 mmHg (03/22 0700) SpO2:  [87 %-97 %] 93 % (03/22 0700) Arterial Line BP: (87-133)/(78-123) 121/112 mmHg (03/21 1145) FiO2 (%):  [30 %-40 %] 40 % (03/22 0752) Weight:  [71.1 kg (156 lb 12 oz)] 71.1 kg (156 lb 12 oz) (03/22 0323)   General: Neuro:  rass -1 and nods appropriately and moves all 4s -> on diprivan CV: normal heart sounds PULM: CTA bialteally. Sync with vent but on very high sedation gtt. No wheeze. Barrell chest GVO:HYWVabd Extremities: no edema   LABS:  PULMONARY  Recent Labs Lab 12/04/15 2145 12/05/15 0026  PHART 7.214* 7.435  PCO2ART 105* 63.4*  PO2ART 79.8* 53.0*  HCO3 41.0* 42.9*  TCO2 44.3 45  O2SAT 93.4 88.0    CBC  Recent Labs Lab 12/05/15 0606 12/06/15 0416 12/07/15 0435  HGB 9.1* 8.8* 8.2*  HCT 29.8* 28.5* 26.5*  WBC 13.5* 19.1* 15.0*  PLT 344 324 340    COAGULATION  Recent Labs Lab 12/04/15 2020  INR 0.98    CARDIAC    Recent Labs Lab 12/04/15 2020 12/05/15 0606 12/05/15 0910 12/05/15 1530  TROPONINI 0.03 0.03 0.04* 0.06*   No results for input(s): PROBNP in the last 168 hours.   CHEMISTRY  Recent Labs Lab 12/04/15 2020 12/05/15 0500 12/06/15 0416 12/07/15 0435  NA 134* 134* 133* 129*  K 4.3 3.6 3.5 3.8  CL 76* 85* 92* 92*  CO2 46* 34* 34* 33*  GLUCOSE 118* 134* 124* 120*  BUN _0 CREATININE 0.53 0.76 0.73 0.68  CALCIUM 9.3 8.6* 8.1* 8.0*  MG  --  1.3*  --  2.2  PHOS  --  <1.0*  --  4.0   Estimated Creatinine Clearance: 69.9 mL/min (by C-G  formula based on Cr of 0.68).   LIVER  Recent Labs Lab 12/04/15 2020 12/05/15 0500  ALBUMIN  --  2.9*  INR 0.98  --      INFECTIOUS  Recent Labs Lab 12/05/15 0604 12/05/15 1200 12/07/15 0050  LATICACIDVEN 4.5* 2.1* 0.7     ENDOCRINE CBG (last 3)   Recent Labs  12/06/15 1936 12/06/15 2358 12/07/15 0410  GLUCAP 123* 142* 112*         IMAGING x48h  - image(s) personally visualized  -   highlighted in bold Dg Chest Port 1 View  12/06/2015  CLINICAL DATA:  Respiratory failure, intubated EXAM: PORTABLE CHEST 1 VIEW COMPARISON:  12/05/2015 FINDINGS: Cardiomediastinal silhouette is stable. Stable endotracheal and NG tube position. Left IJ central line with tip in lower SVC again noted. No infiltrate or pulmonary edema. No pneumothorax. IMPRESSION: No active disease.  Stable support apparatus.  No pneumothorax. Electronically Signed   By: Lahoma Crocker M.D.   On: 12/06/2015 07:59       DISCUSSION: Ms. Wendy [name] is a 1F w/ end stage COPD (FEV1 19% predicted in 2014) admitted with acute on chronic hypercapneic respiratory failure requiring intubation. Other medical history includes diastolic heart failure, obstructive sleep apnea and rheumatoid arthritis on Humira and prednisone.   55 year old female with ES-COPD who was on hospice who now presents with respiratory failure and was intubated.  On exam, diffuse wheezing noted.  Patient did very poorly on weaning trials.  Will continue abx, steroids and full vent support for now.  Conversation with the family, the daughters are unclear on severity of lung disease.  She would not want long term mechanical support but will need to discuss terminal extubation at this point   ASSESSMENT / PLAN:  PULMONARY A: Acute on chronic mixed hypercapneic/hypoxemic respiratory failure Acute exacerbation of COPD due to flu A   - failed SBT but it was on dipriva. Flu A Positive P:   Vent support - 8cc/kg  Solumedrol  Continue albuterol  prn xopenex and atrovent schedule q4h Budesonide nebs BID abx as below for empiric rx HCAP given recent hospitalization   CARDIOVASCULAR A:  Hypotension  Sinus tachycardia CVP=10 3/20   - off pressors P:  Dc  aline  Goal MAP >65 Consider stress steroids if increasing pressor needs - on chronic pred (currently on solumedrol)  RENAL A:   Normal  P:   monitor  GASTROINTESTINAL A:   No acute issues  P:   NPO for now Place OGT -TF consult 12/07/15 Stress ulcer ppx  HEMATOLOGIC A:   No acute issues P:  F/u CBC  SQ heparin   INFECTIOUS A:   FLu A positive  P:   Dc  vancomycin  cotninue aztreonam for now Start 5D tamiflu 12/07/15  ENDOCRINE A:   DM  P:   Insulin per ICU protocol   RHEUMATOLOGIC: A: Rheumatoid arthritis on Humira, prednisone P: Hold Humira while acutely ill Prednisone as above - on chronically   NEUROLOGIC A:   Agitated on wua 12/06/15   - wua in progress 12/07/15 P:   Diprivan gtt Baseline xanax for aniety RASS goal: 0 to -1 and PRN versed for sedation/analgesia  GLOBAL Start flu Rx. Do wua and then do sbt  FAMILY  - Updates: Family updated at length at bedside 3/20.  Pt was sent home with morphine after last admit, seen by palliative care but daughters still seem unclear as to how severe her lung disease is.  They reiterated that she would not want long term support or trach but remain hopeful for recovery at this point.  MEt with son and daughter 12/06/15 - daughter said patient would not want to be on vent past few to several days per her wish. However, son felt that patient should pull through and stated example of his ? Significant other dad who went through same experience and was then going up [place] family meet or Palliative Care meeting due by:  day 7  =- done at admit 12/05/15     The patient is critically ill with multiple organ systems failure and requires high complexity decision making  for assessment and support, frequent evaluation and titration of therapies, application of advanced monitoring technologies and extensive interpretation of multiple databases.   Critical Care Time devoted to patient care services described in this note is  30  Minutes. This time reflects time of care of this signee Dr Brand Males. This critical care time does not reflect procedure time, or teaching time or supervisory time of PA/NP/Med student/Med Resident etc but could involve care discussion time    Dr. Brand Males, M.D., [facility].C.P Pulmonary and Critical Care Medicine Staff Physician [place] [facility] Pager: [phone], If no answer or between  15:00h - 7:00h: call [phone]  12/07/2015 9:04 AM

## 2015-12-07 NOTE — Progress Notes (Signed)
eLink Physician-Brief Progress Note Patient Name: Wendy [name] DOB: 08-14-1961 MRN: [id number]   Date of Service  12/07/2015  HPI/Events of Note  Camera check on patient postextubation. Currently watching TV. Work of breathing increases with conversation. Patient inquiring about increased doses of Xanax due to anxiety. Explained the risk of further sedation and worsening her respiratory status.  eICU Interventions  Continuing to monitor patient closely in the intensive care unit for worsening and need for reintubation.     Intervention Category Major Interventions: Respiratory failure - evaluation and management  Lawanda Cousins 12/07/2015, 6:20 PM

## 2015-12-07 NOTE — Care Management Note (Signed)
Case Management Note  Patient Details  Name: Wendy [name] MRN: [id number] Date of Birth: [birth year]  Subjective/Objective:   Pt lives with family, has home O2, rollator and 3-N-1, is active with [facility] for RN and PT services.                         Expected Discharge Plan:  Home w Home Health Services  Discharge planning Services  CM Consult  Post Acute Care Choice:  Resumption of Svcs/PTA Provider  HH Arranged:  RN, PT [facility] Agency:  [facility]  Status of Service:  In process, will continue to follow  Magdalene River, RN 12/07/2015, 10:56 AM

## 2015-12-07 NOTE — Progress Notes (Signed)
I am at remote location  Got page from RT and then d/w RN - patient off diprivan. Doing well on SBT > 1h and meets extubation criteria. I updated daugther over phone and she confirmed desire for reintubation if needed post extubaiton. Patient then confirmed same to RN per RN reports  Plan   Extubate to bipap  reintuate if needed Can try precedex if anxious or morphine for dyspnea as well  Dr. Kalman Shan, M.D., [facility].C.P Pulmonary and Critical Care Medicine Staff Physician [facility] [place] [facility] Pager: [phone], If no answer or between  15:00h - 7:00h: call [phone]  12/07/2015 11:43 AM

## 2015-12-07 NOTE — Progress Notes (Signed)
RT and I discussed with patient the need to wear the bipap.  The patient's WOB has increased and she is becoming more anxious.  She has agreed to put in on in twenty minutes.  Her daughter and son just arrived and the patient would like to speak with them for a little while before putting the bipap back on.  Will continue to monitor.

## 2015-12-07 NOTE — Procedures (Signed)
Extubation Procedure Note  Patient Details:   Name: Wendy [name] DOB: 07/27/1961 MRN: [id number]   Airway Documentation:     Evaluation  O2 sats: stable throughout Complications: No apparent complications Patient did tolerate procedure well. Bilateral Breath Sounds: Diminished Suctioning: Airway Yes   Pateint extubated to $LNC. MD ordered BiPAP after extubation, but patient asked to stay on [place] for a while to speak to family. She stated she felt ok and wanted to stay off as long as possible. BiPAP mask in room. RT to monitor as needed  Lurlean Leyden 12/07/2015, 12:04 PM

## 2015-12-08 ENCOUNTER — Inpatient Hospital Stay (HOSPITAL_COMMUNITY): Payer: Medicare Other

## 2015-12-08 DIAGNOSIS — Z515 Encounter for palliative care: Secondary | ICD-10-CM

## 2015-12-08 DIAGNOSIS — J9622 Acute and chronic respiratory failure with hypercapnia: Secondary | ICD-10-CM | POA: Insufficient documentation

## 2015-12-08 DIAGNOSIS — Z7189 Other specified counseling: Secondary | ICD-10-CM | POA: Insufficient documentation

## 2015-12-08 DIAGNOSIS — J101 Influenza due to other identified influenza virus with other respiratory manifestations: Secondary | ICD-10-CM

## 2015-12-08 LAB — CBC WITH DIFFERENTIAL/PLATELET
Basophils Absolute: 0 10*3/uL (ref 0.0–0.1)
Basophils Relative: 0 %
EOS ABS: 0 10*3/uL (ref 0.0–0.7)
Eosinophils Relative: 0 %
HCT: 28.2 % — ABNORMAL LOW (ref 36.0–46.0)
HEMOGLOBIN: 8.9 g/dL — AB (ref 12.0–15.0)
LYMPHS ABS: 0.6 10*3/uL — AB (ref 0.7–4.0)
LYMPHS PCT: 5 %
MCH: 29.4 pg (ref 26.0–34.0)
MCHC: 31.6 g/dL (ref 30.0–36.0)
MCV: 93.1 fL (ref 78.0–100.0)
Monocytes Absolute: 0.3 10*3/uL (ref 0.1–1.0)
Monocytes Relative: 2 %
NEUTROS ABS: 11.6 10*3/uL — AB (ref 1.7–7.7)
NEUTROS PCT: 93 %
Platelets: 299 10*3/uL (ref 150–400)
RBC: 3.03 MIL/uL — AB (ref 3.87–5.11)
RDW: 14.1 % (ref 11.5–15.5)
WBC: 12.5 10*3/uL — AB (ref 4.0–10.5)

## 2015-12-08 LAB — BASIC METABOLIC PANEL
ANION GAP: 12 (ref 5–15)
BUN: 28 mg/dL — ABNORMAL HIGH (ref 6–20)
CALCIUM: 8.4 mg/dL — AB (ref 8.9–10.3)
CO2: 32 mmol/L (ref 22–32)
Chloride: 95 mmol/L — ABNORMAL LOW (ref 101–111)
Creatinine, Ser: 0.68 mg/dL (ref 0.44–1.00)
Glucose, Bld: 113 mg/dL — ABNORMAL HIGH (ref 65–99)
Potassium: 4.6 mmol/L (ref 3.5–5.1)
SODIUM: 139 mmol/L (ref 135–145)

## 2015-12-08 LAB — GLUCOSE, CAPILLARY
GLUCOSE-CAPILLARY: 109 mg/dL — AB (ref 65–99)
GLUCOSE-CAPILLARY: 108 mg/dL — AB (ref 65–99)
GLUCOSE-CAPILLARY: 116 mg/dL — AB (ref 65–99)
GLUCOSE-CAPILLARY: 127 mg/dL — AB (ref 65–99)
Glucose-Capillary: 146 mg/dL — ABNORMAL HIGH (ref 65–99)
Glucose-Capillary: 128 mg/dL — ABNORMAL HIGH (ref 65–99)
Glucose-Capillary: 90 mg/dL (ref 65–99)

## 2015-12-08 LAB — CULTURE, RESPIRATORY W GRAM STAIN

## 2015-12-08 LAB — POCT I-STAT 3, ART BLOOD GAS (G3+)
Acid-Base Excess: 8 mmol/L — ABNORMAL HIGH (ref 0.0–2.0)
Bicarbonate: 35.8 mEq/L — ABNORMAL HIGH (ref 20.0–24.0)
O2 SAT: 96 %
PCO2 ART: 66.6 mmHg — AB (ref 35.0–45.0)
PO2 ART: 89 mmHg (ref 80.0–100.0)
Patient temperature: 98.6
TCO2: 38 mmol/L (ref 0–100)
pH, Arterial: 7.338 — ABNORMAL LOW (ref 7.350–7.450)

## 2015-12-08 LAB — CULTURE, RESPIRATORY

## 2015-12-08 LAB — TRIGLYCERIDES: Triglycerides: 42 mg/dL (ref <150)

## 2015-12-08 LAB — MAGNESIUM: Magnesium: 2.1 mg/dL (ref 1.7–2.4)

## 2015-12-08 LAB — PHOSPHORUS: Phosphorus: 4.7 mg/dL — ABNORMAL HIGH (ref 2.5–4.6)

## 2015-12-08 MED ORDER — MORPHINE SULFATE (CONCENTRATE) 10 MG/0.5ML PO SOLN
5.0000 mg | ORAL | Status: DC | PRN
  Administered 2015-12-08 – 2015-12-13 (×22): 5 mg via ORAL
  Filled 2015-12-08 (×22): qty 0.5

## 2015-12-08 MED ORDER — GUAIFENESIN ER 600 MG PO TB12
600.0000 mg | ORAL_TABLET | Freq: Two times a day (BID) | ORAL | Status: DC
  Administered 2015-12-08 – 2015-12-18 (×21): 600 mg via ORAL
  Filled 2015-12-08 (×21): qty 1

## 2015-12-08 MED ORDER — SERTRALINE HCL 50 MG PO TABS
50.0000 mg | ORAL_TABLET | Freq: Every day | ORAL | Status: DC
  Administered 2015-12-08 – 2015-12-20 (×13): 50 mg via ORAL
  Filled 2015-12-08 (×13): qty 1

## 2015-12-08 MED ORDER — MORPHINE SULFATE (CONCENTRATE) 10 MG/0.5ML PO SOLN
4.0000 mg | Freq: Once | ORAL | Status: AC
  Administered 2015-12-08: 4 mg via ORAL
  Filled 2015-12-08: qty 0.5

## 2015-12-08 NOTE — Consult Note (Signed)
Consultation Note Date: 12/08/2015   Patient Name: Wendy [name]  DOB: May 31, 1961  MRN: [id number]  Age / Sex: 55 y.o., female  PCP: Dessa Phi, MD Referring Physician: Kalman Shan, MD  Reason for Consultation: Establishing goals of care and Psychosocial/spiritual support    Clinical Assessment/Narrative:  [name] is 49F with history of end stage COPD (FEV1 19% predicted; 0.48L in 2014) on 3.5L home O2 followed by Dr. Maple Hudson, who presented 3/19 with shortness of breath. Had progressively worsening respiratory status and hypercarbia despite BD, steroids and ultimately required intubation in ER.   Family voices concerns with discharge plan from recent admission.  "She was not ready to go home, she was weak and her breathing was bad.  They gave her medicine that made her crazy"  Patient and family are not in a hospice mindset, they are hopeful for improvement and wish for return to baseline.  Family tell me that 5 weeks ago she was independent,  caring for a one year old grand-daughter and driving.      This NP Lorinda Creed reviewed medical records, received report from team, assessed the patient and then meet at the patient's bedside along with her daughter [facility] and SO/Greg  to discuss diagnosis, prognosis, GOC, EOL wishes disposition and options.  Discussed natural trajectory of ES-COPD and limited medical interventions.  All understand the seriousness of the disease.    A detailed discussion was had today regarding advanced directives.  Concepts specific to code status, artifical feeding and hydration, continued IV antibiotics and rehospitalization was had.  The difference between a aggressive medical intervention path  and a palliative comfort care path for this patient at this time was had.  Values and goals of care important to patient and family were attempted to be elicited.  Patient and  family understand how serious her illness is and patient is able to speak to the fact that she would not want to be dependant on a ventilatory long term and likely would not want a trach or a feeding tube.  She states " I never thought about these things"  Patient and family strongly encouraged to continue today's conversation and document AD.   HPOA will be completed today.  Concept of Hospice and Palliative Care were discussed  Natural trajectory and expectations at EOL were discussed.  Questions and concerns addressed.  Hard Choices booklet left for review. Family encouraged to call with questions or concerns.  PMT will continue to support holistically.     Contacts/Participants in Discussion: Primary Decision Maker: Patient herself with support of her family HCPOA: none documented    SUMMARY OF RECOMMENDATIONS  - Treat the treatable, hopeful for return to baseline   Code Status/Advance Care Planning: Full code    Code Status Orders        Start     Ordered   12/04/15 2347  Full code   Continuous     12/04/15 2346    Code Status History    Date Active Date Inactive Code Status Order ID Comments User Context   11/22/2015  3:06 PM 11/30/2015  5:15 PM Full Code [id number]  Denton Brick, MD ED   07/01/2014  5:41 PM 07/04/2014  5:14 PM DNR [id number]  Esperanza Sheets, MD Inpatient   10/06/2013  1:01 AM 10/13/2013  7:25 PM Full Code [id number]  Hillary Bow, DO ED   11/27/2012  3:47 AM 12/01/2012  6:46 PM Full Code [id number]  Lupita Leash, MD  Inpatient      Other Directives:None  Symptom Management:     Weakness: PT evaluation and treatment              Patient and family are open to SNF for short term rehab when medically ready for discharge                      Dyspnea: Medical management of chronic disease                 Initiate use of flutter valve                  Mucinex 600 mg po bid    Anxiety:   Long h/o anxiety and use of benzodiazapines:                   Continue previously written Xanax 1 mg po bid prn                 Initiate Zoloft  50 mg po qhs                 Discussed alternative techniques: breathing, prayer, music     Palliative Prophylaxis:   Aspiration, Bowel Regimen, Delirium Protocol, Frequent Pain Assessment and Oral Care    Psycho-social/Spiritual:   Support System: Fair Desire for further Chaplaincy support:yes- to secure HPOA   Prognosis: Unable to determine    Chief Complaint/ Primary Diagnoses: Present on Admission:  . Chronic respiratory failure with hypoxia (HCC) . COPD mixed type (HCC) . COPD, severe (HCC) . Hypercapnic respiratory failure, chronic (HCC) . OSA (obstructive sleep apnea) . Respiratory failure, acute and chronic (HCC) . Chronic diastolic heart failure (HCC) . Adjustment disorder with mixed anxiety and depressed mood . Acute respiratory failure (HCC)  I have reviewed the medical record, interviewed the patient and family, and examined the patient. The following aspects are pertinent.  Past Medical History  Diagnosis Date  . Asthma   . Seasonal allergies   . Poor dentition   . Alcoholism (HCC)   . COPD (chronic obstructive pulmonary disease) (HCC)   . Depression   . Oxygen dependent     home oxygen 3L/min  . Urinary, incontinence, stress female   . Psoriasis    Social History   Social History  . Marital Status: Widowed    Spouse Name: N/A  . Number of Children: 4  . Years of Education: N/A   Occupational History  . disabled     was a Production designer, theatre/television/film at [employer]   Social History Main Topics  . Smoking status: Former Smoker -- 1.00 packs/day for 40 years    Types: Cigarettes    Start date: 11/10/1970    Quit date: 09/17/2013  . Smokeless tobacco: Never Used  . Alcohol Use: No     Comment: 5 cans of beer daily  " 06/2014 drinks occasional ".  3s/2/16 No longer drink  . Drug Use: No  . Sexual Activity:    Partners: Male    Birth Control/ Protection: None   Other Topics  Concern  . None   Social History Narrative   Family History  Problem Relation Age of Onset  . Lung cancer Father   . COPD Father    Scheduled Meds: . ALPRAZolam  1 mg Oral BID  . antiseptic oral rinse  7 mL Mouth Rinse q12n4p  . antiseptic oral rinse  7 mL Mouth Rinse 10 times per day  .  chlorhexidine  15 mL Mouth Rinse BID  . heparin  5,000 Units Subcutaneous 3 times per day  . insulin aspart  1-3 Units Subcutaneous 6 times per day  . ipratropium  0.5 mg Nebulization 6 times per day  . levalbuterol  0.63 mg Nebulization 6 times per day  . methylPREDNISolone (SOLU-MEDROL) injection  60 mg Intravenous Q6H  . oseltamivir  75 mg Oral BID  . pantoprazole (PROTONIX) IV  40 mg Intravenous QHS  . sodium chloride flush  10-40 mL Intracatheter Q12H   Continuous Infusions: . sodium chloride 10 mL/hr at 12/08/15 0700  . dexmedetomidine 0.3 mcg/kg/hr (12/08/15 0800)   PRN Meds:.sodium chloride, albuterol, bisacodyl, fentaNYL (SUBLIMAZE) injection, Gerhardt's butt cream, hydrALAZINE, sodium chloride flush Medications Prior to Admission:  Prior to Admission medications   Medication Sig Start Date End Date Taking? Authorizing Provider  Adalimumab (HUMIRA PEN-CROHNS STARTER) 40 MG/0.8ML PNKT Inject 40 mg into the skin every 14 (fourteen) days. 05/17/15  Yes Josalyn Funches, MD  ADVAIR DISKUS 500-50 MCG/DOSE AEPB INHALE 1 PUFF INTO THE LUNGS 2 TIMES DAILY 08/29/15  Yes Waymon Budge, MD  albuterol (PROAIR HFA) 108 (90 BASE) MCG/ACT inhaler 2 puffs every 4 hours as needed- rescue Patient taking differently: Inhale 2 puffs into the lungs every 4 (four) hours as needed for wheezing. 2 puffs every 4 hours as needed- rescue 03/02/15  Yes Waymon Budge, MD  albuterol (PROVENTIL) (2.5 MG/3ML) 0.083% nebulizer solution Take 2.5 mg by nebulization every 6 (six) hours as needed for wheezing or shortness of breath.   Yes Historical Provider, MD  ALPRAZolam Prudy Feeler) 1 MG tablet 1 tab, three times daily as  needed Patient taking differently: Take 1 mg by mouth 3 (three) times daily as needed for anxiety. 1 tab, three times daily as needed 11/14/15  Yes Waymon Budge, MD  chlorpheniramine-HYDROcodone (TUSSIONEX) 10-8 MG/5ML SUER Take 5 mLs by mouth every 12 (twelve) hours. 11/30/15  Yes Denton Brick, MD  fluticasone (FLONASE) 50 MCG/ACT nasal spray PLACE 2 SPRAYS INTO BOTH NOSTRILS DAILY. 10/28/15  Yes Waymon Budge, MD  Morphine Sulfate (MORPHINE CONCENTRATE) 10 MG/0.5ML SOLN concentrated solution Take 0.25 mLs (5 mg total) by mouth every 4 (four) hours as needed for anxiety or shortness of breath. 11/30/15  Yes Denton Brick, MD  pantoprazole (PROTONIX) 20 MG tablet TAKE 1 TABLET (20 MG TOTAL) BY MOUTH DAILY BEFORE SUPPER. 08/24/15  Yes Waymon Budge, MD  predniSONE (DELTASONE) 50 MG tablet Take 1 tablet (50 mg total) by mouth daily with breakfast. 11/30/15  Yes Denton Brick, MD  SPIRIVA HANDIHALER 18 MCG inhalation capsule PLACE 1 CAPSULE (18 MCG TOTAL) INTO INHALER AND INHALE DAILY. 10/18/15  Yes Waymon Budge, MD  theophylline (UNIPHYL) 400 MG 24 hr tablet Take 0.5 tablets (200 mg total) by mouth 2 (two) times daily with a meal. 12/07/14  Yes Waymon Budge, MD  traZODone (DESYREL) 50 MG tablet Take 1 tablet (50 mg total) by mouth at bedtime. 11/07/15  Yes Waymon Budge, MD   Allergies  Allergen Reactions  . Augmentin [Amoxicillin-Pot Clavulanate] Nausea Only  . Cefdinir Other (See Comments)    Aches, "heart fluttering"  . Other Nausea Only    PAIN MEDICATIONS-nausea  . Codeine Nausea Only  . Fexofenadine Nausea Only    Review of Systems  Constitutional: Positive for activity change.  Respiratory: Positive for shortness of breath.     Physical Exam  Constitutional: She appears ill.  Cardiovascular: Tachycardia  present.   Respiratory: She has decreased breath sounds in the right lower field and the left lower field.  Neurological: She is alert.  Skin: Skin is warm and dry.     Vital Signs: BP 115/74 mmHg  Pulse 66  Temp(Src) 97.4 F (36.3 C) (Oral)  Resp 14  Ht 5\' 1"  (1.549 m)  Wt 72.2 kg (159 lb 2.8 oz)  BMI 30.09 kg/m2  SpO2 100%  SpO2: SpO2: 100 % O2 Device:SpO2: 100 % O2 Flow Rate: .O2 Flow Rate (L/min): 3 L/min  IO: Intake/output summary:  Intake/Output Summary (Last 24 hours) at 12/08/15 1126 Last data filed at 12/08/15 1100  Gross per 24 hour  Intake 395.61 ml  Output   1310 ml  Net -914.39 ml    LBM: Last BM Date: 12/04/15 Baseline Weight: Weight: 68.947 kg (152 lb) Most recent weight: Weight: 72.2 kg (159 lb 2.8 oz)      Palliative Assessment/Data:  Flowsheet Rows        Most Recent Value   Intake Tab    Referral Department  Critical care   Unit at Time of Referral  ICU   Palliative Care Primary Diagnosis  Pulmonary   Date Notified  12/08/15   Palliative Care Type  Return patient Palliative Care   Reason for referral  Clarify Goals of Care   Date of Admission  12/04/15   Date first seen by Palliative Care  12/08/15   # of days Palliative referral response time  0 Day(s)   # of days IP prior to Palliative referral  4   Clinical Assessment    Psychosocial & Spiritual Assessment    Palliative Care Outcomes       Additional Data Reviewed:  CBC:    Component Value Date/Time   WBC 12.5* 12/08/2015 0422   HGB 8.9* 12/08/2015 0422   HCT 28.2* 12/08/2015 0422   PLT 299 12/08/2015 0422   MCV 93.1 12/08/2015 0422   NEUTROABS 11.6* 12/08/2015 0422   LYMPHSABS 0.6* 12/08/2015 0422   MONOABS 0.3 12/08/2015 0422   EOSABS 0.0 12/08/2015 0422   BASOSABS 0.0 12/08/2015 0422   Comprehensive Metabolic Panel:    Component Value Date/Time   NA 139 12/08/2015 0422   K 4.6 12/08/2015 0422   CL 95* 12/08/2015 0422   CO2 32 12/08/2015 0422   BUN 28* 12/08/2015 0422   CREATININE 0.68 12/08/2015 0422   GLUCOSE 113* 12/08/2015 0422   CALCIUM 8.4* 12/08/2015 0422   AST 20 11/22/2015 0800   ALT 14 11/22/2015 0800   ALKPHOS 40  11/22/2015 0800   BILITOT 0.2* 11/22/2015 0800   PROT 6.3* 11/22/2015 0800   ALBUMIN 2.9* 12/05/2015 0500   Discussed with Dr Marchelle Gearing  Time In: 0900 Time Out: 1100 Time Total: 120 min Greater than 50%  of this time was spent counseling and coordinating care related to the above assessment and plan.  Signed by: Lorinda Creed, NP  Canary Brim, NP  12/08/2015, 11:26 AM  Please contact Palliative Medicine Team phone at [phone] for questions and concerns.

## 2015-12-08 NOTE — Progress Notes (Signed)
PT Cancellation Note  Patient Details Name: Wendy [name] MRN: [id number] DOB: Apr 08, 1961   Cancelled Treatment:    Reason Eval/Treat Not Completed: Fatigue/lethargy limiting ability to participate;Patient not medically ready Pt currently on BiPAP and very lethargic (on precedex, xanax, morphine) and not able to stay aroused to participate in therapy. Nods head "no" to participate. Will follow up.   Blake Divine A Sofi Bryars 12/08/2015, 12:13 PM  Mylo Red, PT, DPT [phone]

## 2015-12-08 NOTE — Progress Notes (Signed)
PULMONARY / CRITICAL CARE MEDICINE   Name: Wendy [name] MRN: [id number] DOB: 10/29/1960    ADMISSION DATE:  12/04/2015 CONSULTATION DATE:  12/04/15  REFERRING MD:  EDP  CHIEF COMPLAINT:  Difficulty breathing  BRIEF Wendy [name] is 36F with history of end stage COPD (FEV1 19% predicted; 0.48L in 2014) on 3.5L home O2 followed by Dr. Annamaria Boots, who presented 3/19 with shortness of breath.  Had progressively worsening respiratory status and hypercarbia despite BD, steroids and ultimately required intubation in ER.    During her last admission she was seen by palliative care and started on morphine to relieve her air hunger. She was discharged on 82m prednisone (stop date 3/20). Her family states that they have previously discussed goals of care and that she would not want to be on mechanical ventilation for a prolonged period. She would not want tracheostomy.     STUDIES:  2D echo 3/20>>> EF 60-65%,  PASP [id number] grade 1 diastolic dysfunction   CULTURES: Blood 3/19>>> Blood 3/20 Urine 3/20 - no growth RVP 3/20 - nasal - Flu A + Trach aspirate bact 3/20 -      ANTIBIOTICS: Vanc 3/19>>> 3/22 Aztreonam 3/19>>>3/22 Tamiflu 3/22 >> (5 datys(   SIGNIFICANT EVENTS:  LINES/TUBES: ETT 3/19>>> L IJ CVL 3/19>>>   EVENTS 12/05/15 - Worsening hypotension overnight.  CVL placed, pressors started.    12/06/15 - off pressors. Did not tolerate PSV yesterday. On fent gtt and diprivan gtt ->  Desaturated and could not do SBT. Following commands otherwise  12/07/15 - went apneic on sbt per RRT but that was on dioprivan.  Flu A positive on PCR. Needed restart home xanax last night. Extubated   SUBJECTIVE/OVERNIGHT/INTERVAL HX 12/08/15 - exutbated to bipap last night. Without bipap increased WOB overnigght needing bipap + xanax -> SVT -> needing precedex -> cough and svt resolved. Now on precedex. Just came off bipap after being on it overnight. REports anxiety and dyspnea. REquests xanax. Says  morphine helps but at home was making her "loopy and dopey" and wants to avoid "too much of it".   VITAL SIGNS: BP 124/74 mmHg  Pulse 63  Temp(Src) 97.5 F (36.4 C) (Oral)  Resp 19  Ht 5' 1"  (1.549 m)  Wt 72.2 kg (159 lb 2.8 oz)  BMI 30.09 kg/m2  SpO2 98%  HEMODYNAMICS:    VENTILATOR SETTINGS: Vent Mode:  [-] BIPAP FiO2 (%):  [40 %-45 %] 45 % Set Rate:  [10 bmp-24 bmp] 10 bmp Vt Set:  [380 mL] 380 mL PEEP:  [5 cmH20] 5 cmH20 Pressure Support:  [5 cmH20] 5 cmH20 Plateau Pressure:  [28 cmH20] 28 cmH20  INTAKE / OUTPUT: I/O last 3 completed shifts: In: 2400.1 [I.V.:1430.1; NG/GT:720; IV Piggyback:250] Out: 2000 [Urine:2000]  PHYSICAL EXAMINATION: Physical Exam: Temp:  [97.5 F (36.4 C)-98.8 F (37.1 C)] 97.5 F (36.4 C) (03/23 0400) Pulse Rate:  [63-128] 63 (03/23 0700) Resp:  [8-32] 19 (03/23 0700) BP: (101-169)/(68-98) 124/74 mmHg (03/23 0700) SpO2:  [92 %-98 %] 98 % (03/23 0700) FiO2 (%):  [40 %-45 %] 45 % (03/23 0321) Weight:  [72.2 kg (159 lb 2.8 oz)] 72.2 kg (159 lb 2.8 oz) (03/23 0600)   General: Neuro:  rass 0 and nods appropriately and moves all 4s -> on precedex CV: normal heart sounds PULM: CTA bialteally. No wheeze. Barrell chest. Class 4 dyspnea - purse lip - says this is worse than her baseline GIN:OMVEabd Extremities: no edema   LABS:  PULMONARY  Recent  Labs Lab 12/04/15 2145 12/05/15 0026 12/08/15 0036  PHART 7.214* 7.435 7.338*  PCO2ART 105* 63.4* 66.6*  PO2ART 79.8* 53.0* 89.0  HCO3 41.0* 42.9* 35.8*  TCO2 44.3 45 38  O2SAT 93.4 88.0 96.0    CBC  Recent Labs Lab 12/06/15 0416 12/07/15 0435 12/08/15 0422  HGB 8.8* 8.2* 8.9*  HCT 28.5* 26.5* 28.2*  WBC 19.1* 15.0* 12.5*  PLT 324 340 299    COAGULATION  Recent Labs Lab 12/04/15 2020  INR 0.98    CARDIAC    Recent Labs Lab 12/04/15 2020 12/05/15 0606 12/05/15 0910 12/05/15 1530  TROPONINI 0.03 0.03 0.04* 0.06*   No results for input(s): PROBNP in the last  168 hours.   CHEMISTRY  Recent Labs Lab 12/04/15 2020 12/05/15 0500 12/06/15 0416 12/07/15 0435 12/08/15 0422  NA 134* 134* 133* 129* 139  K 4.3 3.6 3.5 3.8 4.6  CL 76* 85* 92* 92* 95*  CO2 46* 34* 34* 33* 32  GLUCOSE 118* 134* 124* 120* 113*  BUN 7 9 13 17  28*  CREATININE 0.53 0.76 0.73 0.68 0.68  CALCIUM 9.3 8.6* 8.1* 8.0* 8.4*  MG  --  1.3*  --  2.2 2.1  PHOS  --  <1.0*  --  4.0 4.7*   Estimated Creatinine Clearance: 70.6 mL/min (by C-G formula based on Cr of 0.68).   LIVER  Recent Labs Lab 12/04/15 2020 12/05/15 0500  ALBUMIN  --  2.9*  INR 0.98  --      INFECTIOUS  Recent Labs Lab 12/05/15 0604 12/05/15 1200 12/07/15 0050  LATICACIDVEN 4.5* 2.1* 0.7     ENDOCRINE CBG (last 3)   Recent Labs  12/07/15 1939 12/07/15 2327 12/08/15 0342  GLUCAP 98 128* 109*         IMAGING x48h  - image(s) personally visualized  -   highlighted in bold No results found.     DISCUSSION: Ms. Mangham is a 5F w/ end stage COPD (FEV1 19% predicted in 2014) admitted with acute on chronic hypercapneic respiratory failure requiring intubation. Other medical history includes diastolic heart failure, obstructive sleep apnea and rheumatoid arthritis on Humira and prednisone.   55 year old female with ES-COPD who was on hospice who now presents with respiratory failure and was intubated.  On exam, diffuse wheezing noted.  Patient did very poorly on weaning trials.  Will continue abx, steroids and full vent support for now.  Conversation with the family, the daughters are unclear on severity of lung disease.  She would not want long term mechanical support but will need to discuss terminal extubation at this point   ASSESSMENT / PLAN:  PULMONARY A: Acute on chronic mixed hypercapneic/hypoxemic respiratory failure Acute exacerbation of COPD due to flu A - s/p extubation 12/07/15 to BiPAP   - needing bippap and also anxiety/class4 dysspnea needing precedex + scheduled  xanax   P:   BiPAP qhs + on/off in day - might need home BiPAP qhs - she is reluctant Morphine x 1 dose 4m for dyspena - she is reluctant to have more -> will call pallaitive care to reassess symptom mgmt (their home morphine rec was not working well for her)  + Xanax scheduled Solumedrol  Continue albuterol prn xopenex and atrovent schedule q4h Budesonide nebs BID Flu Rx - as below  CARDIOVASCULAR A:  Hypotension  Sinus tachycardia CVP=10 3/20   - off pressors P:  Goal MAP >65  chronic pred (currently on solumedrol)  RENAL A:  Normal  P:   monitor  GASTROINTESTINAL A:   No acute issues  P:   Oral diet Stress ulcer ppx  HEMATOLOGIC A:   No acute issues P:  F/u CBC  SQ heparin   INFECTIOUS A:   FLu A positive  P:   Started 5D tamiflu 12/07/15  ENDOCRINE A:   DM  P:   Insulin per ICU protocol   RHEUMATOLOGIC: A: Rheumatoid arthritis on Humira, prednisone P: Hold Humira while acutely ill Prednisone as above - on chronically   NEUROLOGIC A:   #baseline - anxiety on trazadone and xanaz per hx #Current Agitated on wua 12/06/15 and post extubation 12/07/15   -needing precedex + scheduled xanax for anxiety :   PLAN -precddex - wean as tolerated Morphine 1 dose for dyspnea - she is reluctant for mroe Baseline xanax for aniety Pallaitive care for symptom mgmt RASS goal: 0 to -1 PRN versed for sedation/analgesia  GLOBAL Start flu Rx. Continue ICU care due to precedex FAMILY  - Updates: Family updated at length at bedside 3/20.  Pt was sent home with morphine after last admit, seen by palliative care but daughters still seem unclear as to how severe her lung disease is.  They reiterated that she would not want long term support or trach but remain hopeful for recovery at this point.  MEt with son and daughter 12/06/15 - daughter said patient would not want to be on vent past few to several days per her wish. However, son felt that patient should  pull through and stated example of his ? Significant other dad who went through same experience and was then going up mountains.  On 12/07/15 - patient and daugher updated. Ok to reintubate. 11/18/15 - patient updated    - Inter-disciplinary family meet or Palliative Care meeting due by:  day 7  =- done at admit 12/05/15     The patient is critically ill with multiple organ systems failure and requires high complexity decision making for assessment and support, frequent evaluation and titration of therapies, application of advanced monitoring technologies and extensive interpretation of multiple databases.   Critical Care Time devoted to patient care services described in this note is  30  Minutes. This time reflects time of care of this signee Dr Brand Males. This critical care time does not reflect procedure time, or teaching time or supervisory time of PA/NP/Med student/Med Resident etc but could involve care discussion time    Dr. Brand Males, M.D., [facility].C.P Pulmonary and Critical Care Medicine Staff Physician [facility] Pager: [phone], If no answer or between  15:00h - 7:00h: call [phone]  12/08/2015 7:15 AM

## 2015-12-08 NOTE — Progress Notes (Signed)
eLink Physician-Brief Progress Note Patient Name: Wendy [name] DOB: 16-Jun-1961 MRN: [id number]   Date of Service  12/08/2015  HPI/Events of Note  Camera check on patient. Normal work of breathing on nasal cannula oxygen. Family at bedside. Patient is currently watching TV.  eICU Interventions  Patient encouraged to wear BiPAP tonight while sleeping.     Intervention Category Major Interventions: Respiratory failure - evaluation and management  Lawanda Cousins 12/08/2015, 10:25 PM

## 2015-12-09 DIAGNOSIS — R0600 Dyspnea, unspecified: Secondary | ICD-10-CM

## 2015-12-09 DIAGNOSIS — I5032 Chronic diastolic (congestive) heart failure: Secondary | ICD-10-CM

## 2015-12-09 DIAGNOSIS — F4323 Adjustment disorder with mixed anxiety and depressed mood: Secondary | ICD-10-CM

## 2015-12-09 LAB — CBC WITH DIFFERENTIAL/PLATELET
Basophils Absolute: 0 10*3/uL (ref 0.0–0.1)
Basophils Relative: 0 %
EOS ABS: 0 10*3/uL (ref 0.0–0.7)
Eosinophils Relative: 0 %
HCT: 32.8 % — ABNORMAL LOW (ref 36.0–46.0)
HEMOGLOBIN: 9.9 g/dL — AB (ref 12.0–15.0)
LYMPHS ABS: 0.8 10*3/uL (ref 0.7–4.0)
Lymphocytes Relative: 6 %
MCH: 28.2 pg (ref 26.0–34.0)
MCHC: 30.2 g/dL (ref 30.0–36.0)
MCV: 93.4 fL (ref 78.0–100.0)
MONOS PCT: 2 %
Monocytes Absolute: 0.3 10*3/uL (ref 0.1–1.0)
NEUTROS PCT: 92 %
Neutro Abs: 11.7 10*3/uL — ABNORMAL HIGH (ref 1.7–7.7)
Platelets: 356 10*3/uL (ref 150–400)
RBC: 3.51 MIL/uL — ABNORMAL LOW (ref 3.87–5.11)
RDW: 14 % (ref 11.5–15.5)
WBC: 12.8 10*3/uL — ABNORMAL HIGH (ref 4.0–10.5)

## 2015-12-09 LAB — GLUCOSE, CAPILLARY
GLUCOSE-CAPILLARY: 108 mg/dL — AB (ref 65–99)
GLUCOSE-CAPILLARY: 108 mg/dL — AB (ref 65–99)
GLUCOSE-CAPILLARY: 129 mg/dL — AB (ref 65–99)
GLUCOSE-CAPILLARY: 144 mg/dL — AB (ref 65–99)
Glucose-Capillary: 105 mg/dL — ABNORMAL HIGH (ref 65–99)
Glucose-Capillary: 154 mg/dL — ABNORMAL HIGH (ref 65–99)

## 2015-12-09 LAB — BASIC METABOLIC PANEL
Anion gap: 8 (ref 5–15)
BUN: 24 mg/dL — AB (ref 6–20)
CHLORIDE: 93 mmol/L — AB (ref 101–111)
CO2: 33 mmol/L — AB (ref 22–32)
Calcium: 8.7 mg/dL — ABNORMAL LOW (ref 8.9–10.3)
Creatinine, Ser: 0.64 mg/dL (ref 0.44–1.00)
GFR calc Af Amer: >60 mL/min (ref >60)
GLUCOSE: 103 mg/dL — AB (ref 65–99)
Potassium: 4.4 mmol/L (ref 3.5–5.1)
SODIUM: 134 mmol/L — AB (ref 135–145)

## 2015-12-09 LAB — MAGNESIUM: Magnesium: 2 mg/dL (ref 1.7–2.4)

## 2015-12-09 LAB — PHOSPHORUS: Phosphorus: 4 mg/dL (ref 2.5–4.6)

## 2015-12-09 MED ORDER — PANTOPRAZOLE SODIUM 40 MG PO TBEC
40.0000 mg | DELAYED_RELEASE_TABLET | Freq: Every day | ORAL | Status: DC
  Administered 2015-12-09 – 2015-12-20 (×12): 40 mg via ORAL
  Filled 2015-12-09 (×12): qty 1

## 2015-12-09 MED ORDER — OSELTAMIVIR PHOSPHATE 75 MG PO CAPS
75.0000 mg | ORAL_CAPSULE | Freq: Two times a day (BID) | ORAL | Status: AC
  Administered 2015-12-09 – 2015-12-11 (×5): 75 mg via ORAL
  Filled 2015-12-09 (×8): qty 1

## 2015-12-09 MED ORDER — METHYLPREDNISOLONE SODIUM SUCC 125 MG IJ SOLR
60.0000 mg | Freq: Two times a day (BID) | INTRAMUSCULAR | Status: DC
Start: 2015-12-09 — End: 2015-12-10
  Administered 2015-12-09 – 2015-12-10 (×2): 60 mg via INTRAVENOUS
  Filled 2015-12-09 (×2): qty 2

## 2015-12-09 MED ORDER — FUROSEMIDE 10 MG/ML IJ SOLN
40.0000 mg | Freq: Every day | INTRAMUSCULAR | Status: DC
  Administered 2015-12-09 – 2015-12-12 (×4): 40 mg via INTRAVENOUS
  Filled 2015-12-09 (×4): qty 4

## 2015-12-09 NOTE — Progress Notes (Signed)
Nutrition Follow-up  DOCUMENTATION CODES:   Not applicable  INTERVENTION:   Magic cup with Breakfast, each supplement provides 290 kcal and 9 grams of protein  NUTRITION DIAGNOSIS:   Inadequate oral intake related to  (SOB) as evidenced by per patient/family report. Ongoing.   GOAL:   Patient will meet greater than or equal to 90% of their needs Progressing  MONITOR:   PO intake, Supplement acceptance, I & O's  ASSESSMENT:   55 year old female w/ end stage COPD admitted with acute on chronic hypercapneic respiratory failure requiring intubation. Other medical history includes diastolic heart failure, obstructive sleep apnea and rheumatoid arthritis on Humira and prednisone.  3/22 extubated Labs reviewed: sodium low 134 CBG's: 105-127 Weight increased by 10 lb since admission. Noted pt positive 8L. Palliative care following, but full code for now. Pt remains in ICU for BiPAP needs.  Per pt she did not eat well, Meal Completion: 5% this am due to not having her dentures. Her family will bring in this afternoon. She admits to SOB when eating at home but takes her time. Feels her weight has been stable. She does not like ensure/boost but is willing to try magic cup. Discussed how oral nutrition supplements fit in her diet for weight maintenance.   Diet Order:  Diet regular Room service appropriate?: Yes; Fluid consistency:: Thin  Skin:  Wound (see comment) (MASD on buttocks, labia, perineum)  Last BM:  3/20  Height:   Ht Readings from Last 1 Encounters:  12/04/15 5\' 1"  (1.549 m)    Weight:   Wt Readings from Last 1 Encounters:  12/09/15 162 lb 11.2 oz (73.8 kg)    Ideal Body Weight:  47.7 kg  BMI:  Body mass index is 30.76 kg/(m^2).  Estimated Nutritional Needs:   Kcal:  1600-1800  Protein:  90-105 grams  Fluid:  > 1.6 L/day  EDUCATION NEEDS:   No education needs identified at this time  12/11/15 RD, LDN, CNSC [phone] Pager [phone] After  Hours Pager

## 2015-12-09 NOTE — Progress Notes (Addendum)
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 Annamaria Boots, who presented 3/19 with shortness of breath.  Had progressively worsening respiratory status and hypercarbia despite BD, steroids and ultimately required intubation in ER.    During her last admission she was seen by palliative care and started on morphine to relieve her air hunger. She was discharged on 77m prednisone (stop date 3/20). Her family states that they have previously discussed goals of care and that she would not want to be on mechanical ventilation for a prolonged period. She would not want tracheostomy.     STUDIES:  2D echo 3/20>>> EF 60-65%,  PASP 327POEU grade 1 diastolic dysfunction   CULTURES: Blood 3/20>> ng Urine 3/20 - no growth RVP 3/20 - nasal - Flu A + Trach aspirate bact 3/20 -      ANTIBIOTICS: Vanc 3/19>>> 3/22 Aztreonam 3/19>>>3/22 Tamiflu 3/22 >> (5 datys(   SIGNIFICANT EVENTS:  LINES/TUBES: ETT 3/19>>> 3/22 L IJ CVL 3/19>>>   EVENTS 12/05/15 - Worsening hypotension overnight.  CVL placed, pressors started.   12/07/15 - went apneic on sbt per RRT but that was on dioprivan.  Flu A positive on PCR. Needed restart home xanax last night. Extubated 3/23 palliative care consut   SUBJECTIVE/OVERNIGHT/INTERVAL HX On bipap breathing 'better' Off precedex gtt Has gained wt - about 10 lbs per pt  VITAL SIGNS: BP 177/92 mmHg  Pulse 92  Temp(Src) 97 F (36.1 C) (Axillary)  Resp 16  Ht 5' 1"  (1.549 m)  Wt 162 lb 11.2 oz (73.8 kg)  BMI 30.76 kg/m2  SpO2 93%  HEMODYNAMICS:    VENTILATOR SETTINGS: Vent Mode:  [-] BIPAP FiO2 (%):  [40 %] 40 % PEEP:  [5 cmH20] 5 cmH20 Pressure Support:  [10 cmH20]  10 cmH20  INTAKE / OUTPUT: I/O last 3 completed shifts: In: 1108.1 [P.O.:600; I.V.:508.1] Out: 1830 [Urine:1830]  PHYSICAL EXAMINATION: Physical Exam: Temp:  [96.7 F (35.9 C)-98.4 F (36.9 C)] 97 F (36.1 C) (03/24 0747) Pulse Rate:  [62-103] 92 (03/24 0900) Resp:  [11-22] 16 (03/24 0900) BP: (103-179)/(69-114) 177/92 mmHg (03/24 0900) SpO2:  [93 %-100 %] 93 % (03/24 0900) FiO2 (%):  [40 %] 40 % (03/24 0741) Weight:  [162 lb 11.2 oz (73.8 kg)] 162 lb 11.2 oz (73.8 kg) (03/24 0408)   General: Neuro:  rass 0 and nods appropriately and moves all 4s -> on precedex CV: normal heart sounds PULM: decreasedbialteally. No wheeze. Barrell chest. Class 4 dyspnea - purse lip - says this is worse than her baseline GMP:NTIRabd Extremities: no edema   LABS:  PULMONARY  Recent Labs Lab 12/04/15 2145 12/05/15 0026 12/08/15 0036  PHART 7.214* 7.435 7.338*  PCO2ART 105* 63.4* 66.6*  PO2ART 79.8* 53.0* 89.0  HCO3 41.0* 42.9* 35.8*  TCO2 44.3 45 38  O2SAT 93.4 88.0 96.0    CBC  Recent Labs Lab 12/07/15 0435 12/08/15 0422 12/09/15 0456  HGB 8.2* 8.9* 9.9*  HCT 26.5* 28.2* 32.8*  WBC 15.0* 12.5* 12.8*  PLT 340 299 356    COAGULATION  Recent Labs Lab 12/04/15 2020  INR 0.98    CARDIAC    Recent Labs Lab 12/04/15 2020 12/05/15 0606 12/05/15  0910 12/05/15 1530  TROPONINI 0.03 0.03 0.04* 0.06*   No results for input(s): PROBNP in the last 168 hours.   CHEMISTRY  Recent Labs Lab 12/05/15 0500 12/06/15 0416 12/07/15 0435 12/08/15 0422 12/09/15 0456  NA 134* 133* 129* 139 134*  K 3.6 3.5 3.8 4.6 4.4  CL 85* 92* 92* 95* 93*  CO2 34* 34* 33* 32 33*  GLUCOSE 134* 124* 120* 113* 103*  BUN 9 13 17  28* 24*  CREATININE 0.76 0.73 0.68 0.68 0.64  CALCIUM 8.6* 8.1* 8.0* 8.4* 8.7*  MG 1.3*  --  2.2 2.1 2.0  PHOS <1.0*  --  4.0 4.7* 4.0   Estimated Creatinine Clearance: 71.3 mL/min (by C-G formula based on Cr of 0.64).   LIVER  Recent Labs Lab  12/04/15 2020 12/05/15 0500  ALBUMIN  --  2.9*  INR 0.98  --      INFECTIOUS  Recent Labs Lab 12/05/15 0604 12/05/15 1200 12/07/15 0050  LATICACIDVEN 4.5* 2.1* 0.7     ENDOCRINE CBG (last 3)   Recent Labs  12/09/15 0007 12/09/15 0414 12/09/15 0744  GLUCAP 108* 108* 105*         IMAGING x48h  - image(s) personally visualized  -   highlighted in bold Dg Chest Port 1 View  12/08/2015  CLINICAL DATA:  CABG EXAM: PORTABLE CHEST 1 VIEW COMPARISON:  12/06/2015 FINDINGS: Endotracheal and NG tubes removed. Lungs are well aerated and clear. Left jugular venous catheter is stable. No pneumothorax. IMPRESSION: Extubated.  Clear lungs. Electronically Signed   By: Marybelle Killings M.D.   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 Other medical history includes diastolic heart failure, obstructive sleep apnea and rheumatoid arthritis on Humira and prednisone.     ASSESSMENT / PLAN:  PULMONARY A: Acute on chronic mixed hypercapneic/hypoxemic respiratory failure Acute exacerbation of COPD due to flu A - s/p extubation 12/07/15 to BiPAP   - needing bippap and also anxiety/class4 dysspnea needing precedex + scheduled xanax   P:   BiPAP qhs + prn  day - might need home BiPAP qhs - she is reluctant Morphine x 1 dose 68m for dyspena - she is reluctant to have more -> added  Xanax scheduled Solumedrol drop to 60 q 12h Continue albuterol prn xopenex and atrovent schedule q4h Budesonide nebs BID Flu Rx - as below  CARDIOVASCULAR A:  Hypotension -resolved Sinus tachycardia  P:  Goal MAP >65   RENAL A:   Pedal edema  P:   Lasix 40 daily  GASTROINTESTINAL A:   No acute issues  P:   Oral diet Stress ulcer ppx  HEMATOLOGIC A:   No acute issues P:  F/u CBC  SQ heparin   INFECTIOUS A:   FLu A positive  P:    Started 5D tamiflu 12/07/15  ENDOCRINE A:   DM  P:   SSI  RHEUMATOLOGIC: A: Rheumatoid arthritis on Humira, prednisone P: Hold Humira while acutely ill Prednisone as above - on chronically   NEUROLOGIC A:   #baseline - anxiety on trazadone and xanaz per hx  -scheduled xanax for anxiety :   PLAN -precedex off  Baseline xanax for aniety Pallaitive care for symptom mgmt   GLOBAL Start flu Rx. Continue ICU care due to bipap needs- if lower can go to SDU  FAMILY  -  Updates: Family updated at length at bedside 3/20.  Pt was sent home with morphine after last admit, seen by palliative care but daughters still seem unclear as to how severe her lung disease is.  They reiterated that she would not want long term support or trach but remain hopeful for recovery at this point.  MEt with son and daughter 12/06/15 - daughter said patient would not want to be on vent past few to several days per her wish. However, son felt that patient should pull through and stated example of his ? Significant other dad who went through same experience and was then going up mountains.  - Full code as of 3/24   - Inter-disciplinary family meet or Palliative Care meeting due by:  day 7  =- done at admit 12/05/15   The patient is critically ill with multiple organ systems failure and requires high complexity decision making for assessment and support, frequent evaluation and titration of therapies, application of advanced monitoring technologies and extensive interpretation of multiple databases. Critical Care Time devoted to patient care services described in this note independent of APP time is 31 minutes.     12/09/2015 9:58 AM

## 2015-12-09 NOTE — Evaluation (Signed)
Physical Therapy Evaluation Patient Details Name: Wendy [name] MRN: [id number] DOB: 1960-12-18 Today's Date: 12/09/2015   History of Present Illness  Patient is a 55 y/o female with hx of severe COPD, alcoholism and depression presents with SOB. Had progressively worsening respiratory status and hypercarbia requiring intubation 3/19-3/22.  Clinical Impression  Patient presents with dyspnea on exertion, generalized weakness, deconditioning, impaired balance, endurance and overall mobility. Requires assist of 2 to stand and for SPT due to bil knee buckling. SOB noted during activity requiring longer rest breaks. Pt does not have necessary assist at home and would benefit from ST SNF to maximize independence and mobility prior to return home. Motivated to return to PLOF.    Follow Up Recommendations SNF    Equipment Recommendations  Rolling walker with 5" wheels    Recommendations for Other Services OT consult     Precautions / Restrictions Precautions Precautions: Fall Restrictions Weight Bearing Restrictions: No      Mobility  Bed Mobility Overal bed mobility: Needs Assistance Bed Mobility: Sit to Supine       Sit to supine: Min assist;HOB elevated   General bed mobility comments: Increased time. Assist with LLE.  Transfers Overall transfer level: Needs assistance Equipment used: 2 person hand held assist Transfers: Sit to/from [employer] Sit to Stand: Mod assist;+2 physical assistance Stand pivot transfers: Mod assist;+2 physical assistance       General transfer comment: Assist of 2 to stand from chair with cues for hand placement/technique. Cues for upright posture. Bil knee instability. Able to shuffle feet towards bed with bil knee instability and requires mod A of 2 for balance and weight shifting. 3/4 DOE.   Ambulation/Gait                Stairs            Wheelchair Mobility    Modified Rankin (Stroke Patients Only)       Balance Overall balance assessment: Needs assistance Sitting-balance support: Feet supported;No upper extremity supported Sitting balance-Leahy Scale: Good     Standing balance support: During functional activity;Bilateral upper extremity supported Standing balance-Leahy Scale: Poor Standing balance comment: Reliant on external support for balance- Mod A of 2. bil knee buckling.                             Pertinent Vitals/Pain Pain Assessment: Faces Faces Pain Scale: Hurts little more Pain Location: heels Pain Descriptors / Indicators: Sore Pain Intervention(s): Monitored during session;Repositioned;RN gave pain meds during session    Home Living Family/patient expects to be discharged to:: Private residence Living Arrangements: Spouse/significant other;Children (81 y/o son with down sydrome) Available Help at Discharge: Family;Available 24 hours/day Type of Home: House Home Access: Stairs to enter   [employer] of Steps: 1 Home Layout: One level Home Equipment: Bedside commode;Walker - 4 wheels      Prior Function Level of Independence: Needs assistance      ADL's / Homemaking Assistance Needed: Reports independent with ADLs and IADLs prior to hospital admission 2 weeks ago.  Comments: Her companion and son able to assist with IADLs.     Hand Dominance   Dominant Hand: Right    Extremity/Trunk Assessment   Upper Extremity Assessment: Defer to OT evaluation;Generalized weakness           Lower Extremity Assessment:  (Grossly ~2+/5 throughout BLEs. )      Cervical / Trunk Assessment: Normal  Communication   Communication: No difficulties  Cognition Arousal/Alertness: Awake/alert Behavior During Therapy: WFL for tasks assessed/performed Overall Cognitive Status: Within Functional Limits for tasks assessed                      General Comments      Exercises        Assessment/Plan    PT Assessment Patient needs  continued PT services  PT Diagnosis Generalized weakness;Difficulty walking   PT Problem List Decreased strength;Cardiopulmonary status limiting activity;Decreased activity tolerance;Decreased balance;Decreased mobility  PT Treatment Interventions Balance training;Gait training;Functional mobility training;Therapeutic activities;Therapeutic exercise;Patient/family education;Stair training;DME instruction   PT Goals (Current goals can be found in the Care Plan section) Acute Rehab PT Goals Patient Stated Goal: go to rehab so I can be independent again PT Goal Formulation: With patient Time For Goal Achievement: 12/23/15 Potential to Achieve Goals: Good    Frequency Min 3X/week   Barriers to discharge Decreased caregiver support      Co-evaluation               End of Session Equipment Utilized During Treatment: Gait belt;Oxygen Activity Tolerance: Patient limited by fatigue Patient left: in bed;with call bell/phone within reach;with nursing/sitter in room Nurse Communication: Mobility status         Time: 3536-1443 PT Time Calculation (min) (ACUTE ONLY): 30 min   Charges:   PT Evaluation $PT Eval Moderate Complexity: 1 Procedure PT Treatments $Therapeutic Activity: 8-22 mins   PT G Codes:        Braxton Weisbecker A Jermany Rimel 12/09/2015, 3:46 PM  Mylo Red, PT, DPT [phone]

## 2015-12-09 NOTE — Progress Notes (Signed)
Daily Progress Note   Patient Name: Wendy [name]       Date: 12/09/2015 DOB: [birth year]  Age: 55 y.o. MRN#: [id number] Attending Physician: Kalman Shan, MD Primary Care Physician: Lora Paula, MD Admit Date: 12/04/2015  Reason for Consultation/Follow-up: Establishing goals of care, Non pain symptom management and Psychosocial/spiritual support  Subjective:  -continued conversation at bedside with patient and her family, she "feels better today" and is stronger.  Hope is to treat the treatable and improve enough to rehab, hoping to return home with her family.  - all understand the seriousness of her illness.  Patient and family need continued education regarding plausible treatments for her illness, limitations of medical interventions and natural trajectory of ES COPD.  PMT will continue to support holistically     Length of Stay: 5 days  Current Medications: Scheduled Meds:  . ALPRAZolam  1 mg Oral BID  . antiseptic oral rinse  7 mL Mouth Rinse q12n4p  . chlorhexidine  15 mL Mouth Rinse BID  . furosemide  40 mg Intravenous Daily  . guaiFENesin  600 mg Oral BID  . heparin  5,000 Units Subcutaneous 3 times per day  . insulin aspart  1-3 Units Subcutaneous 6 times per day  . ipratropium  0.5 mg Nebulization 6 times per day  . levalbuterol  0.63 mg Nebulization 6 times per day  . methylPREDNISolone (SOLU-MEDROL) injection  60 mg Intravenous Q12H  . oseltamivir  75 mg Oral BID  . pantoprazole  40 mg Oral Q1200  . sertraline  50 mg Oral QHS  . sodium chloride flush  10-40 mL Intracatheter Q12H    Continuous Infusions: . sodium chloride 10 mL/hr at 12/09/15 0800    PRN Meds: sodium chloride, albuterol, bisacodyl, fentaNYL (SUBLIMAZE) injection, Gerhardt's butt  cream, hydrALAZINE, morphine CONCENTRATE, sodium chloride flush  Physical Exam: Physical Exam  Constitutional: She appears ill.  Cardiovascular: Tachycardia present.   Pulmonary/Chest: Tachypnea noted. She has decreased breath sounds in the right lower field and the left lower field.  Neurological: She is alert.  Skin: Skin is warm and dry.                Vital Signs: BP 185/86 mmHg  Pulse 96  Temp(Src) 98 F (36.7 C) (Oral)  Resp 15  Ht 5\' 1"  (  1.549 m)  Wt 73.8 kg (162 lb 11.2 oz)  BMI 30.76 kg/m2  SpO2 99% SpO2: SpO2: 99 % O2 Device: O2 Device: Nasal Cannula O2 Flow Rate: O2 Flow Rate (L/min): 2 L/min  Intake/output summary:  Intake/Output Summary (Last 24 hours) at 12/09/15 1319 Last data filed at 12/09/15 1100  Gross per 24 hour  Intake  663.6 ml  Output   2465 ml  Net -1801.4 ml   LBM: Last BM Date: 12/04/15 Baseline Weight: Weight: 68.947 kg (152 lb) Most recent weight: Weight: 73.8 kg (162 lb 11.2 oz)       Palliative Assessment/Data: Flowsheet Rows        Most Recent Value   Intake Tab    Referral Department  Critical care   Unit at Time of Referral  ICU   Palliative Care Primary Diagnosis  Pulmonary   Date Notified  12/08/15   Palliative Care Type  Return patient Palliative Care   Reason for referral  Clarify Goals of Care   Date of Admission  12/04/15   Date first seen by Palliative Care  12/08/15   # of days Palliative referral response time  0 Day(s)   # of days IP prior to Palliative referral  4   Clinical Assessment    Psychosocial & Spiritual Assessment    Palliative Care Outcomes       Additional Data Reviewed: CBC    Component Value Date/Time   WBC 12.8* 12/09/2015 0456   RBC 3.51* 12/09/2015 0456   HGB 9.9* 12/09/2015 0456   HCT 32.8* 12/09/2015 0456   PLT 356 12/09/2015 0456   MCV 93.4 12/09/2015 0456   MCH 28.2 12/09/2015 0456   MCHC 30.2 12/09/2015 0456   RDW 14.0 12/09/2015 0456   LYMPHSABS 0.8 12/09/2015 0456   MONOABS 0.3  12/09/2015 0456   EOSABS 0.0 12/09/2015 0456   BASOSABS 0.0 12/09/2015 0456    CMP     Component Value Date/Time   NA 134* 12/09/2015 0456   K 4.4 12/09/2015 0456   CL 93* 12/09/2015 0456   CO2 33* 12/09/2015 0456   GLUCOSE 103* 12/09/2015 0456   BUN 24* 12/09/2015 0456   CREATININE 0.64 12/09/2015 0456   CALCIUM 8.7* 12/09/2015 0456   PROT 6.3* 11/22/2015 0800   ALBUMIN 2.9* 12/05/2015 0500   AST 20 11/22/2015 0800   ALT 14 11/22/2015 0800   ALKPHOS 40 11/22/2015 0800   BILITOT 0.2* 11/22/2015 0800   GFRNONAA >60 12/09/2015 0456   GFRAA >60 12/09/2015 0456       Problem List:  Patient Active Problem List   Diagnosis Date Noted  . Influenza A 12/08/2015  . Acute on chronic respiratory failure with hypercapnia (HCC)   . DNR (do not resuscitate) discussion   . Acute respiratory failure (HCC) 12/04/2015  . Dyspnea   . Respiratory failure, acute and chronic (HCC) 11/27/2015  . Palliative care encounter   . Goals of care, counseling/discussion   . Hypercapnic respiratory failure, chronic (HCC)   . OSA (obstructive sleep apnea)   . COPD, severe (HCC)   . Immunosuppressed status (HCC)   . Pneumonia 11/22/2015  . Chronic respiratory failure with hypoxia (HCC) 11/20/2015  . Insomnia 06/05/2015  . Hoarseness 05/17/2015  . Vitamin D deficiency 11/17/2014  . Healthcare maintenance 11/17/2014  . Decreased visual acuity 11/17/2014  . Chronic diastolic heart failure (HCC) 07/02/2014  . Lung nodules 11/08/2013  . Thrombocytopenia, unspecified (HCC) 10/06/2013  . Alcohol abuse, hx of 12/01/2012  Class: Chronic  . Overdose of benzodiazepine, hx of 11/27/2012  . Alcohol withdrawal, hx of 11/27/2012  . Psoriasis 08/18/2011  . Chronic rhinitis 11/06/2010  . Adjustment disorder with mixed anxiety and depressed mood 08/24/2010  . TOBACCO ABUSE, hx of 03/09/2008  . COPD mixed type (HCC) 03/09/2008     Palliative Care Assessment & Plan    1.Code Status:  Full code-  encouraged to consider DNR status     Code Status Orders        Start     Ordered   12/04/15 2347  Full code   Continuous     12/04/15 2346    Code Status History    Date Active Date Inactive Code Status Order ID Comments User Context   11/22/2015  3:06 PM 11/30/2015  5:15 PM Full Code [id number]  Denton Brick, MD ED   07/01/2014  5:41 PM 07/04/2014  5:14 PM DNR [id number]  Esperanza Sheets, MD Inpatient   10/06/2013  1:01 AM 10/13/2013  7:25 PM Full Code [id number]  Hillary Bow, DO ED   11/27/2012  3:47 AM 12/01/2012  6:46 PM Full Code [id number]  Lupita Leash, MD Inpatient       2. Goals of Care/Additional Recommendations:    Desire for further Chaplaincy support:  Yes, spiritual care involved, HPOA secured             Psycho-social Needs:    3. Symptom Management:      1. Dyspnea: medical mangement of chronic disease                           Roxanol 5 mg po/sl every 3 hrs prn                            Respiratory therapy and pulmonary tolieting        2.  Anxiety: Zoloft 50 mg po q hs, patient agrees to continue                         Xanax 1 mg po bid  4. Palliative Prophylaxis:   Aspiration, Bowel Regimen, Delirium Protocol, Frequent Pain Assessment and Oral Care  5. Prognosis: Unable to determine  6. Discharge Planning:        Patient is open to short term rehab if eligle when medically stable  Care plan was discussed with Dr Vassie Loll  Thank you for allowing the Palliative Medicine Team to assist in the care of this patient.   Time In:  1135 Time Out: 1200 Total Time 25 min Prolonged Time Billed  no         Canary Brim, NP  12/09/2015, 1:19 PM  Please contact Palliative Medicine Team phone at [phone] for questions and concerns.

## 2015-12-09 NOTE — Progress Notes (Signed)
Chaplain presented to the patient's room to follow up and complete advance directive.  All forms were signed and/or initialed  by the patient to witness her requests, witnesses and Notary were present to complete the document. Chaplain placed original form in the patient's medical record, and copies were given to her daughter. Patient was appreciative to all the spiritual care support. Will continue to follow as needed. Chaplain Janell Quiet [phone]

## 2015-12-10 DIAGNOSIS — J9600 Acute respiratory failure, unspecified whether with hypoxia or hypercapnia: Secondary | ICD-10-CM

## 2015-12-10 LAB — MAGNESIUM: MAGNESIUM: 1.8 mg/dL (ref 1.7–2.4)

## 2015-12-10 LAB — CBC WITH DIFFERENTIAL/PLATELET
BASOS ABS: 0 10*3/uL (ref 0.0–0.1)
Basophils Relative: 0 %
EOS ABS: 0 10*3/uL (ref 0.0–0.7)
EOS PCT: 0 %
HCT: 32.2 % — ABNORMAL LOW (ref 36.0–46.0)
Hemoglobin: 10.3 g/dL — ABNORMAL LOW (ref 12.0–15.0)
Lymphocytes Relative: 6 %
Lymphs Abs: 0.7 10*3/uL (ref 0.7–4.0)
MCH: 29.9 pg (ref 26.0–34.0)
MCHC: 32 g/dL (ref 30.0–36.0)
MCV: 93.6 fL (ref 78.0–100.0)
Monocytes Absolute: 0.7 10*3/uL (ref 0.1–1.0)
Monocytes Relative: 6 %
Neutro Abs: 9.4 10*3/uL — ABNORMAL HIGH (ref 1.7–7.7)
Neutrophils Relative %: 88 %
PLATELETS: 317 10*3/uL (ref 150–400)
RBC: 3.44 MIL/uL — AB (ref 3.87–5.11)
RDW: 14.1 % (ref 11.5–15.5)
WBC: 10.7 10*3/uL — AB (ref 4.0–10.5)

## 2015-12-10 LAB — CULTURE, BLOOD (ROUTINE X 2)
Culture: NO GROWTH
Culture: NO GROWTH

## 2015-12-10 LAB — GLUCOSE, CAPILLARY
GLUCOSE-CAPILLARY: 113 mg/dL — AB (ref 65–99)
GLUCOSE-CAPILLARY: 121 mg/dL — AB (ref 65–99)
GLUCOSE-CAPILLARY: 125 mg/dL — AB (ref 65–99)
Glucose-Capillary: 87 mg/dL (ref 65–99)
Glucose-Capillary: 95 mg/dL (ref 65–99)
Glucose-Capillary: 96 mg/dL (ref 65–99)

## 2015-12-10 LAB — BASIC METABOLIC PANEL
ANION GAP: 6 (ref 5–15)
BUN: 22 mg/dL — ABNORMAL HIGH (ref 6–20)
CALCIUM: 8.5 mg/dL — AB (ref 8.9–10.3)
CO2: 39 mmol/L — AB (ref 22–32)
Chloride: 92 mmol/L — ABNORMAL LOW (ref 101–111)
Creatinine, Ser: 0.57 mg/dL (ref 0.44–1.00)
GFR calc Af Amer: >60 mL/min (ref >60)
Glucose, Bld: 105 mg/dL — ABNORMAL HIGH (ref 65–99)
Potassium: 4.1 mmol/L (ref 3.5–5.1)
SODIUM: 137 mmol/L (ref 135–145)

## 2015-12-10 LAB — PHOSPHORUS: PHOSPHORUS: 3.7 mg/dL (ref 2.5–4.6)

## 2015-12-10 MED ORDER — METHYLPREDNISOLONE SODIUM SUCC 125 MG IJ SOLR
40.0000 mg | Freq: Two times a day (BID) | INTRAMUSCULAR | Status: DC
  Administered 2015-12-10 – 2015-12-12 (×4): 40 mg via INTRAVENOUS
  Filled 2015-12-10 (×4): qty 2

## 2015-12-10 NOTE — Progress Notes (Signed)
Dr. Tyson Alias came to the bedside and talked to patient about Code status, he explained that with her lung function if things deteriorate she would require intubation and later on a tracheostomy. Patient agreed to no intubation and no CPR.  Corliss Skains RN

## 2015-12-10 NOTE — Progress Notes (Signed)
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 Maple Hudson, who presented 3/19 with shortness of breath.  Had progressively worsening respiratory status and hypercarbia despite BD, steroids and ultimately required intubation in ER.    During her last admission she was seen by palliative care and started on morphine to relieve her air hunger. She was discharged on 50mg  prednisone (stop date 3/20). Her family states that they have previously discussed goals of care and that she would not want to be on mechanical ventilation for a prolonged period. She would not want tracheostomy.     STUDIES:  2D echo 3/20>>> EF 60-65%,  PASP , grade 1 diastolic dysfunction   CULTURES: Blood 3/20>> ng Urine 3/20 - no growth RVP 3/20 - nasal - Flu A + Trach aspirate bact 3/20 -   ANTIBIOTICS: Vanc 3/19>>> 3/22 Aztreonam 3/19>>>3/22 Tamiflu 3/22 >> (5 days)  SIGNIFICANT EVENTS: Re required NIMV  LINES/TUBES: ETT 3/19>>> 3/22 L IJ CVL 3/19>>>   EVENTS 12/05/15 - Worsening hypotension overnight.  CVL placed, pressors started.   12/07/15 - went apneic on sbt per RRT but that was on dioprivan.  Flu A positive on PCR. Needed restart home xanax last night. Extubated 3/23 palliative care consut   SUBJECTIVE/OVERNIGHT/INTERVAL HX On bipap  VITAL SIGNS: BP 132/77 mmHg  Pulse 89  Temp(Src) 98.5 F (36.9 C) (Oral)  Resp 15  Ht 5\' 1"  (1.549 m)  Wt 71 kg (156 lb 8.4 oz)  BMI 29.59 kg/m2  SpO2 96%  HEMODYNAMICS:    VENTILATOR SETTINGS: Vent Mode:  [-] BIPAP FiO2 (%):  [40 %] 40 % Set Rate:  [10 bmp] 10 bmp PEEP:  [5 cmH20] 5 cmH20 Pressure Support:  [10 cmH20] 10 cmH20  INTAKE / OUTPUT: I/O last 3  completed shifts: In: 1060 [P.O.:720; I.V.:340] Out: 3980 [Urine:3980]  PHYSICAL EXAMINATION: Physical Exam: Temp:  [96.9 F (36.1 C)-98.7 F (37.1 C)] 98.5 F (36.9 C) (03/25 0859) Pulse Rate:  [71-105] 89 (03/25 1000) Resp:  [10-20] 15 (03/25 1000) BP: (132-205)/(67-105) 132/77 mmHg (03/25 1000) SpO2:  [93 %-100 %] 96 % (03/25 1000) FiO2 (%):  [40 %] 40 % (03/25 1000) Weight:  [71 kg (156 lb 8.4 oz)] 71 kg (156 lb 8.4 oz) (03/25 0500)   General: Neuro:  rass 0 alert, oriented to her cicrcumstances and O x 3 CV: normal heart sounds not tachy s1 s2  PULM: decreased but clear apical, on nimv [place]:SHUO abdo soft, BS wnl , no r/g, obese Extremities: no edema   LABS:  PULMONARY  Recent Labs Lab 12/04/15 2145 12/05/15 0026 12/08/15 0036  PHART 7.214* 7.435 7.338*  PCO2ART 105* 63.4* 66.6*  PO2ART 79.8* 53.0* 89.0  HCO3 41.0* 42.9* 35.8*  TCO2 44.3 45 38  O2SAT 93.4 88.0 96.0    CBC  Recent Labs Lab 12/08/15 0422 12/09/15 0456 12/10/15 0428  HGB 8.9* 9.9* 10.3*  HCT 28.2* 32.8* 32.2*  WBC 12.5* 12.8* 10.7*  PLT 299 356 317    COAGULATION  Recent Labs Lab 12/04/15 2020  INR 0.98    CARDIAC    Recent Labs Lab 12/04/15 2020 12/05/15 0606 12/05/15 0910 12/05/15 1530  TROPONINI 0.03 0.03 0.04* 0.06*  No results for input(s): PROBNP in the last 168 hours.   CHEMISTRY  Recent Labs Lab 12/05/15 0500 12/06/15 0416 12/07/15 0435 12/08/15 0422 12/09/15 0456 12/10/15 0428  NA 134* 133* 129* 139 134* 137  K 3.6 3.5 3.8 4.6 4.4 4.1  CL 85* 92* 92* 95* 93* 92*  CO2 34* 34* 33* 32 33* 39*  GLUCOSE 134* 124* 120* 113* 103* 105*  BUN 9 13 17  28* 24* 22*  CREATININE 0.76 0.73 0.68 0.68 0.64 0.57  CALCIUM 8.6* 8.1* 8.0* 8.4* 8.7* 8.5*  MG 1.3*  --  2.2 2.1 2.0 1.8  PHOS <1.0*  --  4.0 4.7* 4.0 3.7   Estimated Creatinine Clearance: 69.9 mL/min (by C-G formula based on Cr of 0.57).   LIVER  Recent Labs Lab 12/04/15 2020 12/05/15 0500   ALBUMIN  --  2.9*  INR 0.98  --      INFECTIOUS  Recent Labs Lab 12/05/15 0604 12/05/15 1200 12/07/15 0050  LATICACIDVEN 4.5* 2.1* 0.7     ENDOCRINE CBG (last 3)   Recent Labs  12/10/15 0034 12/10/15 0437 12/10/15 0851  GLUCAP 96 95 87    IMAGING x48h  - image(s) personally visualized  -   highlighted in bold No results found.  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 Other medical history includes diastolic heart failure, obstructive sleep apnea and rheumatoid arthritis on Humira and prednisone.   ASSESSMENT / PLAN:  PULMONARY A: Acute on chronic mixed hypercapneic/hypoxemic respiratory failure Acute exacerbation of COPD due to flu A - s/p extubation 12/07/15 to BiPAP  P:   BiPAP re reuired, consider scheduled 4 on 4 off z 24 hr See discussion below, she DOES NOT WANT ETT for aggressive care if arrest Morphine, Xanax scheduled Solumedrol drop to 60 q 12h to 40 mg, she is moving air Continue albuterol prn, follow HR response xopenex and atrovent schedule q4h Budesonide nebs BID Flu Rx - as below, for 7 days Neg balance seemed to have helped, continued this pcxr to follow  CARDIOVASCULAR A:  Hypotension -resolved Sinus tachycardia  P:  Goal MAP >60 She would NOT want aggressive alcs  RENAL A:   Pedal edema pulm edema component P:   Lasix 40 daily, maintain, neg 1 liter goals Chem in am   GASTROINTESTINAL A:   No acute issues  P:   Oral diet, may need to hold if RR rises Stress ulcer ppx  HEMATOLOGIC A:   No acute issues P:  F/u CBC  SQ heparin  Unable to ambulate INFECTIOUS A:   FLu A positive  P:   Started 5D tamiflu 12/07/15- consider 5 days  ENDOCRINE A:   DM  P:   SSI Steroids reduction slight  RHEUMATOLOGIC: A: Rheumatoid arthritis on Humira, prednisone P: Hold Humira while acutely  ill Prednisone as above - on chronically , now on IV steroids  NEUROLOGIC A:   #baseline - anxiety on trazadone and xanaz per hx  -scheduled xanax for anxiety  PLAN Baseline xanax for aniety Pallaitive care for symptom mgmt   FAMILY  - Updates: to the pt alert and Ox 4  - Inter-disciplinary family meet or Palliative Care meeting due by:  day 7  =- done at admit 12/05/15  ccm time 30 min  I have had extensive discussions with pt and 12/07/15. We discussed patients current circumstances and organ failures. We  also discussed patient's prior wishes under circumstances such as this. Family has decided to NOT perform resuscitation if arrest but to continue current medical support for now. She does not want ett under any cicryumctances and no code. No family present, RN was present  Mcarthur Rossetti. Tyson Alias, MD, FACP Pgr: [phone] [facility]'

## 2015-12-10 NOTE — Progress Notes (Signed)
Patient felt air hungry and requested prn morphine and scheduled Alpraxolam, RT called to the bedside and patient placed back on bipap. Spo2 maintained in the mid 90's.  Corliss Skains RN

## 2015-12-10 NOTE — Progress Notes (Addendum)
Repositioning patient on bed patient Spo2 dropped from upper 90's to 84 % Spo2. O2 sats back up to 96%.  Corliss Skains RN

## 2015-12-10 NOTE — Progress Notes (Signed)
Transferred patient and all patient belongings to 3S Room 10.  Belongings included cell phone and charger, dentures, blanket from home, toiletries, etc.

## 2015-12-11 ENCOUNTER — Inpatient Hospital Stay (HOSPITAL_COMMUNITY): Payer: Medicare Other

## 2015-12-11 LAB — GLUCOSE, CAPILLARY
GLUCOSE-CAPILLARY: 140 mg/dL — AB (ref 65–99)
GLUCOSE-CAPILLARY: 87 mg/dL (ref 65–99)
GLUCOSE-CAPILLARY: 89 mg/dL (ref 65–99)
Glucose-Capillary: 97 mg/dL (ref 65–99)
Glucose-Capillary: 197 mg/dL — ABNORMAL HIGH (ref 65–99)
Glucose-Capillary: 109 mg/dL — ABNORMAL HIGH (ref 65–99)

## 2015-12-11 LAB — BASIC METABOLIC PANEL
ANION GAP: 7 (ref 5–15)
BUN: 19 mg/dL (ref 6–20)
CO2: 42 mmol/L — ABNORMAL HIGH (ref 22–32)
Calcium: 8.5 mg/dL — ABNORMAL LOW (ref 8.9–10.3)
Chloride: 88 mmol/L — ABNORMAL LOW (ref 101–111)
Creatinine, Ser: 0.53 mg/dL (ref 0.44–1.00)
GLUCOSE: 86 mg/dL (ref 65–99)
POTASSIUM: 3.7 mmol/L (ref 3.5–5.1)
Sodium: 137 mmol/L (ref 135–145)

## 2015-12-11 LAB — MAGNESIUM: MAGNESIUM: 1.8 mg/dL (ref 1.7–2.4)

## 2015-12-11 LAB — PHOSPHORUS: Phosphorus: 3.8 mg/dL (ref 2.5–4.6)

## 2015-12-11 MED ORDER — LEVALBUTEROL HCL 0.63 MG/3ML IN NEBU
0.6300 mg | INHALATION_SOLUTION | Freq: Three times a day (TID) | RESPIRATORY_TRACT | Status: DC
  Administered 2015-12-11 – 2015-12-21 (×29): 0.63 mg via RESPIRATORY_TRACT
  Filled 2015-12-11 (×30): qty 3

## 2015-12-11 MED ORDER — POTASSIUM CHLORIDE CRYS ER 20 MEQ PO TBCR
40.0000 meq | EXTENDED_RELEASE_TABLET | Freq: Once | ORAL | Status: AC
  Administered 2015-12-11: 40 meq via ORAL
  Filled 2015-12-11: qty 2

## 2015-12-11 MED ORDER — IPRATROPIUM BROMIDE 0.02 % IN SOLN
0.5000 mg | Freq: Three times a day (TID) | RESPIRATORY_TRACT | Status: DC
  Administered 2015-12-11 – 2015-12-21 (×29): 0.5 mg via RESPIRATORY_TRACT
  Filled 2015-12-11 (×29): qty 2.5

## 2015-12-11 MED ORDER — MAGNESIUM SULFATE 50 % IJ SOLN
2.0000 g | Freq: Once | INTRAVENOUS | Status: DC
  Filled 2015-12-11: qty 4

## 2015-12-11 MED ORDER — MAGNESIUM SULFATE 2 GM/50ML IV SOLN
2.0000 g | Freq: Once | INTRAVENOUS | Status: AC
  Administered 2015-12-11: 2 g via INTRAVENOUS
  Filled 2015-12-11: qty 50

## 2015-12-11 NOTE — Progress Notes (Signed)
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 Maple Hudson, who presented 3/19 with shortness of breath.  Had progressively worsening respiratory status and hypercarbia despite BD, steroids and ultimately required intubation in ER.    During her last admission she was seen by palliative care and started on morphine to relieve her air hunger. She was discharged on 50mg  prednisone (stop date 3/20). Her family states that they have previously discussed goals of care and that she would not want to be on mechanical ventilation for a prolonged period. She would not want tracheostomy.     STUDIES:  2D echo 3/20>>> EF 60-65%,  PASP 4/20, grade 1 diastolic dysfunction   CULTURES: Blood 3/20>> ng Urine 3/20 - no growth RVP 3/20 - nasal - Flu A + Trach aspirate bact 3/20 -   ANTIBIOTICS: Vanc 3/19>>> 3/22 Aztreonam 3/19>>>3/22 Tamiflu 3/22 >> (5 days)  SIGNIFICANT EVENTS: Re required NIMV  LINES/TUBES: ETT 3/19>>> 3/22 L IJ CVL 3/19>>>   EVENTS 12/05/15 - Worsening hypotension overnight.  CVL placed, pressors started.   12/07/15 - went apneic on sbt per RRT but that was on dioprivan.  Flu A positive on PCR. Needed restart home xanax last night. Extubated 3/23 palliative care consut   SUBJECTIVE/OVERNIGHT/INTERVAL HX ONLY wore BIPAP at night for 4 hours, no distress, improved status Neg 2.8 liters   VITAL SIGNS: BP 165/87 mmHg  Pulse 85  Temp(Src) 98.3 F (36.8 C) (Oral)  Resp 17  Ht 5\' 1"  (1.549 m)  Wt 70.8 kg (156 lb 1.4 oz)  BMI 29.51 kg/m2  SpO2 97%  HEMODYNAMICS:    VENTILATOR SETTINGS: Vent Mode:  [-] BIPAP FiO2 (%):  [40 %] 40 % Set Rate:  [10 bmp] 10 bmp  INTAKE / OUTPUT: I/O  last 3 completed shifts: In: 830 [P.O.:480; I.V.:350] Out: 4505 [Urine:4505]  PHYSICAL EXAMINATION: Physical Exam: Temp:  [98.2 F (36.8 C)-98.7 F (37.1 C)] 98.3 F (36.8 C) (03/26 1152) Pulse Rate:  [79-105] 85 (03/26 0819) Resp:  [13-18] 17 (03/26 0819) BP: (108-165)/(70-87) 165/87 mmHg (03/26 0819) SpO2:  [91 %-99 %] 97 % (03/26 0841) FiO2 (%):  [40 %] 40 % (03/26 0348) Weight:  [70.8 kg (156 lb 1.4 oz)] 70.8 kg (156 lb 1.4 oz) (03/26 0420)   General: Neuro:  rass 0 alert, oriented to her cicrcumstances and O x 3 CV: normal heart sounds s1 s2  PULM: coughing, ronchi 11-30-1999 abdo soft, BS wnl , no r/g, obese Extremities: no edema   LABS:  PULMONARY  Recent Labs Lab 12/04/15 2145 12/05/15 0026 12/08/15 0036  PHART 7.214* 7.435 7.338*  PCO2ART 105* 63.4* 66.6*  PO2ART 79.8* 53.0* 89.0  HCO3 41.0* 42.9* 35.8*  TCO2 44.3 45 38  O2SAT 93.4 88.0 96.0    CBC  Recent Labs Lab 12/08/15 0422 12/09/15 0456 12/10/15 0428  HGB 8.9* 9.9* 10.3*  HCT 28.2* 32.8* 32.2*  WBC 12.5* 12.8* 10.7*  PLT 299 356 317    COAGULATION  Recent Labs Lab 12/04/15 2020  INR 0.98    CARDIAC    Recent Labs Lab 12/04/15 2020 12/05/15 0606 12/05/15 0910 12/05/15 1530  TROPONINI 0.03 0.03 0.04* 0.06*   No results for input(s):  PROBNP in the last 168 hours.   CHEMISTRY  Recent Labs Lab 12/07/15 0435 12/08/15 0422 12/09/15 0456 12/10/15 0428 12/11/15 0526  NA 129* 139 134* 137 137  K 3.8 4.6 4.4 4.1 3.7  CL 92* 95* 93* 92* 88*  CO2 33* 32 33* 39* 42*  GLUCOSE 120* 113* 103* 105* 86  BUN 17 28* 24* 22* 19  CREATININE 0.68 0.68 0.64 0.57 0.53  CALCIUM 8.0* 8.4* 8.7* 8.5* 8.5*  MG 2.2 2.1 2.0 1.8 1.8  PHOS 4.0 4.7* 4.0 3.7 3.8   Estimated Creatinine Clearance: 69.8 mL/min (by C-G formula based on Cr of 0.53).   LIVER  Recent Labs Lab 12/04/15 2020 12/05/15 0500  ALBUMIN  --  2.9*  INR 0.98  --      INFECTIOUS  Recent Labs Lab 12/05/15 0604  12/05/15 1200 12/07/15 0050  LATICACIDVEN 4.5* 2.1* 0.7     ENDOCRINE CBG (last 3)   Recent Labs  12/11/15 0015 12/11/15 0419 12/11/15 0820  GLUCAP 140* 97 89    IMAGING x48h  - image(s) personally visualized  -   highlighted in bold Dg Chest Port 1 View  12/11/2015  CLINICAL DATA:  Flu EXAM: PORTABLE CHEST 1 VIEW COMPARISON:  12/08/2015 FINDINGS: Cardiomediastinal silhouette is stable. No infiltrate or pulmonary edema. Mild hyperinflation. Left IJ central line with tip in distal SVC is unchanged in position. No pneumothorax. IMPRESSION: No infiltrate or pulmonary edema. Mild hyperinflation. Stable left IJ central line position. Electronically Signed   By: Natasha Mead M.D.   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 Other medical history includes diastolic heart failure, obstructive sleep apnea and rheumatoid arthritis on Humira and prednisone.   ASSESSMENT / PLAN:  PULMONARY A: Acute on chronic mixed hypercapneic/hypoxemic respiratory failure Acute exacerbation of COPD due to flu A - s/p extubation 12/07/15 to BiPAP  P:   BiPAP can dc, she does not require this now, has improved Neg balance obtain also helped, maintain  tamiflu completed DNI DNR per pt!! k , mag supp with lasix Mobilize pcxr is also improving She can use Noctrunal BIPAP as she is requesting Will keep IV steroids and reduce to pred in am likely To floor  Mcarthur Rossetti. Tyson Alias, MD, FACP Pgr: [phone] [place]'

## 2015-12-11 NOTE — Progress Notes (Signed)
Discussed with patient plan to discontinue central line due to no longer having need for access, pt stated desire to leave central line in place because " I am a hard stick" education given on increased risk for infection but patient continues to request central line remain in place. Dr. Tyson Alias spoke with patient and reiterated education regarding central line access. Patient also has no had BM in several days, last documented stool was 33/20/2017, but patient continues to refuse stool softeners or laxatives.

## 2015-12-11 NOTE — Progress Notes (Signed)
Pt. taken off BiPAP per request after scheduled aerosol tx. was given, wore BiPAP X4 hrs, was tolerating well, RN made aware, placed on 3 lpm n/c, 98% sats, RT to monitor.

## 2015-12-12 DIAGNOSIS — J9621 Acute and chronic respiratory failure with hypoxia: Secondary | ICD-10-CM

## 2015-12-12 DIAGNOSIS — J101 Influenza due to other identified influenza virus with other respiratory manifestations: Secondary | ICD-10-CM

## 2015-12-12 DIAGNOSIS — G4733 Obstructive sleep apnea (adult) (pediatric): Secondary | ICD-10-CM

## 2015-12-12 LAB — GLUCOSE, CAPILLARY
GLUCOSE-CAPILLARY: 146 mg/dL — AB (ref 65–99)
Glucose-Capillary: 108 mg/dL — ABNORMAL HIGH (ref 65–99)
Glucose-Capillary: 86 mg/dL (ref 65–99)
Glucose-Capillary: 131 mg/dL — ABNORMAL HIGH (ref 65–99)
Glucose-Capillary: 150 mg/dL — ABNORMAL HIGH (ref 65–99)

## 2015-12-12 LAB — BASIC METABOLIC PANEL
ANION GAP: 6 (ref 5–15)
BUN: 16 mg/dL (ref 6–20)
CALCIUM: 8.5 mg/dL — AB (ref 8.9–10.3)
CO2: 43 mmol/L — ABNORMAL HIGH (ref 22–32)
Chloride: 87 mmol/L — ABNORMAL LOW (ref 101–111)
Creatinine, Ser: 0.54 mg/dL (ref 0.44–1.00)
GFR calc Af Amer: >60 mL/min (ref >60)
GLUCOSE: 88 mg/dL (ref 65–99)
Potassium: 3.8 mmol/L (ref 3.5–5.1)
Sodium: 136 mmol/L (ref 135–145)

## 2015-12-12 LAB — MAGNESIUM: MAGNESIUM: 2.1 mg/dL (ref 1.7–2.4)

## 2015-12-12 LAB — PHOSPHORUS: PHOSPHORUS: 3.3 mg/dL (ref 2.5–4.6)

## 2015-12-12 MED ORDER — FUROSEMIDE 40 MG PO TABS
40.0000 mg | ORAL_TABLET | Freq: Every day | ORAL | Status: DC
  Administered 2015-12-12 – 2015-12-14 (×3): 40 mg via ORAL
  Filled 2015-12-12 (×4): qty 1

## 2015-12-12 MED ORDER — INSULIN ASPART 100 UNIT/ML ~~LOC~~ SOLN
0.0000 [IU] | Freq: Three times a day (TID) | SUBCUTANEOUS | Status: DC
  Administered 2015-12-12: 1 [IU] via SUBCUTANEOUS
  Administered 2015-12-13: 2 [IU] via SUBCUTANEOUS
  Administered 2015-12-14 (×2): 1 [IU] via SUBCUTANEOUS
  Administered 2015-12-15: 2 [IU] via SUBCUTANEOUS
  Administered 2015-12-15 – 2015-12-16 (×3): 1 [IU] via SUBCUTANEOUS
  Administered 2015-12-17: 2 [IU] via SUBCUTANEOUS
  Administered 2015-12-17: 1 [IU] via SUBCUTANEOUS
  Administered 2015-12-18: 2 [IU] via SUBCUTANEOUS
  Administered 2015-12-19: 1 [IU] via SUBCUTANEOUS
  Administered 2015-12-19 – 2015-12-20 (×2): 2 [IU] via SUBCUTANEOUS

## 2015-12-12 MED ORDER — INSULIN ASPART 100 UNIT/ML ~~LOC~~ SOLN: 0.0000 [IU] | Freq: Every day | SUBCUTANEOUS | Status: DC

## 2015-12-12 MED ORDER — PREDNISONE 50 MG PO TABS
60.0000 mg | ORAL_TABLET | Freq: Every day | ORAL | Status: DC
  Administered 2015-12-13 – 2015-12-18 (×6): 60 mg via ORAL
  Filled 2015-12-12 (×6): qty 1

## 2015-12-12 MED ORDER — METHYLPREDNISOLONE SODIUM SUCC 125 MG IJ SOLR: 40.0000 mg | Freq: Every day | INTRAMUSCULAR | Status: DC

## 2015-12-12 NOTE — Progress Notes (Signed)
Spoke with Dr. Isaiah Serge regarding BiPAP order.  Okay to leave patient off BiPAP for now if patient is comfortable.

## 2015-12-12 NOTE — Progress Notes (Signed)
Physical Therapy Treatment Patient Details Name: Wendy [name] MRN: [id number] DOB: 03-19-1961 Today's Date: 12/12/2015    History of Present Illness Patient is a 55 y/o female with hx of severe COPD, alcoholism and depression presents with SOB. Had progressively worsening respiratory status and hypercarbia requiring intubation 3/19-3/22.    PT Comments    Ms. Fera made good progress today, ambulating 50 ft w/ RW today.  Pt fatigues quickly and will benefit from continued skilled PT services to increase functional independence and safety.  Follow Up Recommendations  SNF     Equipment Recommendations  Rolling walker with 5" wheels    Recommendations for Other Services OT consult     Precautions / Restrictions Precautions Precautions: Fall Precaution Comments: O2 via nasal cannula 3.5 L at baseline Restrictions Weight Bearing Restrictions: No    Mobility  Bed Mobility               General bed mobility comments: Pt sitting  EOB upon PT arrival  Transfers Overall transfer level: Needs assistance Equipment used: Rolling walker (2 wheeled) Transfers: Sit to/from Stand Sit to Stand: Min guard;+2 safety/equipment         General transfer comment: Cues for hand placement and pt slow to stand.    Ambulation/Gait Ambulation/Gait assistance: Min guard;+2 safety/equipment Ambulation Distance (Feet): 50 Feet Assistive device: Rolling walker (2 wheeled) Gait Pattern/deviations: Step-through pattern;Decreased stride length   Gait velocity interpretation: Below normal speed for age/gender General Gait Details: Cues for proper technique using RW as pt pushing it too far ahead.  SpO2 remains in high 90's on 1L O2 while ambulating but drops down to 87% on 1L O2 after sitting and O2 bumped up to 3L O2 w/ sats recovering to 100%.   Stairs            Wheelchair Mobility    Modified Rankin (Stroke Patients Only)       Balance Overall balance assessment: Needs  assistance Sitting-balance support: Feet supported;No upper extremity supported Sitting balance-Leahy Scale: Good     Standing balance support: Bilateral upper extremity supported;During functional activity Standing balance-Leahy Scale: Poor Standing balance comment: uses RW for support                     Cognition Arousal/Alertness: Awake/alert Behavior During Therapy: WFL for tasks assessed/performed Overall Cognitive Status: Within Functional Limits for tasks assessed                      Exercises General Exercises - Lower Extremity Ankle Circles/Pumps: AROM;Both;10 reps;Seated Long Arc Quad: AROM;Both;10 reps;Seated Hip Flexion/Marching: AROM;Both;10 reps;Seated    General Comments        Pertinent Vitals/Pain Pain Assessment: No/denies pain Pain Intervention(s): Limited activity within patient's tolerance;Monitored during session    Home Living                      Prior Function            PT Goals (current goals can now be found in the care plan section) Acute Rehab PT Goals Patient Stated Goal: I want to get up and walk PT Goal Formulation: With patient Time For Goal Achievement: 12/23/15 Progress towards PT goals: Progressing toward goals    Frequency  Min 3X/week    PT Plan Current plan remains appropriate    Co-evaluation             End of Session Equipment Utilized During Treatment:  Gait belt;Oxygen Activity Tolerance: Patient limited by fatigue Patient left: in chair;with call bell/phone within reach;with family/visitor present     Time: 7026-3785 PT Time Calculation (min) (ACUTE ONLY): 23 min  Charges:  $Gait Training: 8-22 mins $Therapeutic Exercise: 8-22 mins                    G Codes:      Encarnacion Chu PT, DPT  Pager: [phone] Phone: [phone] 12/12/2015, 1:17 PM

## 2015-12-12 NOTE — Progress Notes (Signed)
Pt transferred to 5W14 with all belongings. 5W RN and NT at bedside.

## 2015-12-12 NOTE — Progress Notes (Signed)
Received pt.from 3S nurse via wheelchair,AAO X 4;WITH O2 at 3 lnc in use;no acute resp.distress.Made comfortable in bed.V/S taken & recorded.ID bracelet applied after verifying her name & b-day with pt.Placed pt.on tele box#22 & verified with central tele Skin assessment done & verified with Dierdre Highman.Will continue to monitor pt.

## 2015-12-12 NOTE — Progress Notes (Signed)
Dr. Vassie Loll paged for clarification orders for BiPAP.

## 2015-12-12 NOTE — Progress Notes (Signed)
Report given to [employer], Forensic scientist for [employer].

## 2015-12-12 NOTE — Progress Notes (Signed)
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  Had progressively worsening respiratory status and hypercarbia despite BD, steroids and ultimately required intubation in ER.    During her last admission she was seen by palliative care and started on morphine to relieve her air hunger. She was discharged on 50mg  prednisone (stop date 3/20). Her family states that they have previously discussed goals of care and that she would not want to be on mechanical ventilation for a prolonged period. She would not want tracheostomy.    STUDIES:  2D echo 3/20>>> EF 60-65%,  PASP 4/20, grade 1 diastolic dysfunction   CULTURES: Blood 3/20>> ng Urine 3/20 - no growth RVP 3/20 - nasal - Flu A + Trach aspirate bact 3/20 -   ANTIBIOTICS: Vanc 3/19>>> 3/22 Aztreonam 3/19>>>3/22 Tamiflu 3/22 >> (5 days)  LINES/TUBES: ETT 3/19>>> 3/22 L IJ CVL 3/19>>>  EVENTS 12/05/15 - Worsening hypotension overnight.  CVL placed, pressors started.  12/07/15 - went apneic on sbt per RRT but that was on dioprivan.  Flu A positive on PCR. Needed restart home xanax last night. Extubated 3/23 palliative care consut   SUBJECTIVE: Patient reports she did have a bowel movement. Denies any abdominal pain or nausea. Was on BiPAP overnight last night. Reports dyspnea and nonproductive cough continuing to improve.  REVIEW OF SYSTEMS: No chest pain or pressure. No subjective fever or chills.  VITAL SIGNS: BP 130/64 mmHg  Pulse 102  Temp(Src) 99 F (37.2 C) (Oral)  Resp 19  Ht 5\' 1"  (1.549 m)  Wt 70.8 kg (156 lb 1.4 oz)  BMI 29.51 kg/m2  SpO2 98%  HEMODYNAMICS:    VENTILATOR  SETTINGS:    INTAKE / OUTPUT: I/O last 3 completed shifts: In: 250 [P.O.:120; I.V.:130] Out: 2875 [Urine:2875]  PHYSICAL EXAMINATION: Physical Exam: Temp:  [97.8 F (36.6 C)-99 F (37.2 C)] 99 F (37.2 C) (03/27 1400) Pulse Rate:  [77-102] 102 (03/27 1400) Resp:  [14-24] 19 (03/27 1400) BP: (130-162)/(64-86) 130/64 mmHg (03/27 1436) SpO2:  [95 %-100 %] 98 % (03/27 1400)   General: General:  Sleeping until awoken. No acute distress. No family at bedside.  Integument:  Warm & dry. No rash on exposed skin.  HEENT:  Moist mucus membranes. No oral ulcers. No scleral injection or icterus. Cardiovascular:  Regular rate. No edema. No appreciable JVD.  Pulmonary:  Symmetrically decreased breath sounds. Mildly increased work of breathing on nasal cannula oxygen. Abdomen: Soft. Normal bowel sounds. Nondistended. Grossly nontender. Neurological: Alert and oriented 3. Grossly nonfocal. Moving all 4 extremities.  LABS:  PULMONARY  Recent Labs Lab 12/08/15 0036  PHART 7.338*  PCO2ART 66.6*  PO2ART 89.0  HCO3 35.8*  TCO2 38  O2SAT 96.0    CBC  Recent Labs Lab 12/08/15 0422 12/09/15 0456 12/10/15 0428  HGB 8.9* 9.9* 10.3*  HCT 28.2* 32.8* 32.2*  WBC 12.5* 12.8* 10.7*  PLT 299 356 317    COAGULATION No results for input(s): INR in the last 168 hours.  CARDIAC   No results for input(s): TROPONINI in the last 168 hours. No results for input(s): PROBNP in the last 168 hours.  CHEMISTRY  Recent Labs Lab 12/08/15 0422 12/09/15 0456 12/10/15 0428 12/11/15 0526 12/12/15 0635  NA 139 134* 137 137 136  K 4.6 4.4 4.1 3.7 3.8  CL 95* 93* 92* 88* 87*  CO2 32 33* 39* 42* 43*  GLUCOSE 113* 103* 105* 86 88  BUN 28* 24* 22* 19 16  CREATININE 0.68 0.64 0.57 0.53 0.54  CALCIUM 8.4* 8.7* 8.5* 8.5* 8.5*  MG 2.1 2.0 1.8 1.8 2.1  PHOS 4.7* 4.0 3.7 3.8 3.3   Estimated Creatinine Clearance: 69.8 mL/min (by C-G formula based on Cr of 0.54).   LIVER No results for  input(s): AST, ALT, ALKPHOS, BILITOT, PROT, ALBUMIN, INR in the last 168 hours.   INFECTIOUS  Recent Labs Lab 12/07/15 0050  LATICACIDVEN 0.7     ENDOCRINE CBG (last 3)   Recent Labs  12/12/15 0325 12/12/15 0836 12/12/15 1115  GLUCAP 108* 86 131*    IMAGING x48h  - image(s) personally visualized  -   highlighted in bold Dg Chest Port 1 View  12/11/2015  CLINICAL DATA:  Flu EXAM: PORTABLE CHEST 1 VIEW COMPARISON:  12/08/2015 FINDINGS: Cardiomediastinal silhouette is stable. No infiltrate or pulmonary edema. Mild hyperinflation. Left IJ central line with tip in distal SVC is unchanged in position. No pneumothorax. IMPRESSION: No infiltrate or pulmonary edema. Mild hyperinflation. Stable left IJ central line position. Electronically Signed   By: Wendy [name] M.D.   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 Other medical history includes diastolic heart failure, obstructive sleep apnea and rheumatoid arthritis on Humira and prednisone. Clinically patient continues to improve. I am continuing to wean oxygen and steroids for the patient's acute respiratory failure.   1. Acute on Chronic Hypercarbic Respiratory Failure: Continuing treatment of COPD exacerbation. Continuing nocturnal BiPAP. 2. Acute on Chronic Hypoxic Respiratory Failure: Improving. Likely secondary to influenza pneumonia. Switching to oral Lasix daily. 3. COPD Exacerbation: Continuing treatment with Xopenex & Atrovent nebulized 3 times a day. Changing to Prednisone 60mg  daily tomorrow. 4. End-Stage Lung Disease:  Morphine PO prn. Xanax bid. Palliative medicine consulted. 5. Influenza A Pneumonia: Status post treatment with Tamiflu. 6. Constipation: Resolved. 7. H/O Depression: Continuing Xanax twice a day & Zoloft daily at bedtime. 8. Diet: Regular diet. 9. Prophylaxis:  Protonix by mouth daily & heparin subcutaneous every 8 hours. 10. Disposition:  Likely would benefit from SNF placement. However patient wished to go home upon discharge. PT/OT consutled.  Wendy [name], M.D. [facility] Pager:  [phone] After 3pm or if no response, call [phone] 3:32 PM 12/12/2015

## 2015-12-12 NOTE — Progress Notes (Signed)
Left central line dressing change performed using strict sterile technique

## 2015-12-13 LAB — BASIC METABOLIC PANEL
ANION GAP: 6 (ref 5–15)
BUN: 11 mg/dL (ref 6–20)
CO2: 46 mmol/L — AB (ref 22–32)
Calcium: 8.8 mg/dL — ABNORMAL LOW (ref 8.9–10.3)
Chloride: 85 mmol/L — ABNORMAL LOW (ref 101–111)
Creatinine, Ser: 0.59 mg/dL (ref 0.44–1.00)
GFR calc Af Amer: >60 mL/min (ref >60)
GFR calc non Af Amer: >60 mL/min (ref >60)
GLUCOSE: 84 mg/dL (ref 65–99)
Potassium: 3.3 mmol/L — ABNORMAL LOW (ref 3.5–5.1)
Sodium: 137 mmol/L (ref 135–145)

## 2015-12-13 LAB — GLUCOSE, CAPILLARY
GLUCOSE-CAPILLARY: 167 mg/dL — AB (ref 65–99)
GLUCOSE-CAPILLARY: 108 mg/dL — AB (ref 65–99)
Glucose-Capillary: 113 mg/dL — ABNORMAL HIGH (ref 65–99)
Glucose-Capillary: 81 mg/dL (ref 65–99)

## 2015-12-13 MED ORDER — ALPRAZOLAM 0.5 MG PO TABS
1.0000 mg | ORAL_TABLET | Freq: Three times a day (TID) | ORAL | Status: DC | PRN
  Administered 2015-12-13 – 2015-12-21 (×19): 1 mg via ORAL
  Filled 2015-12-13 (×21): qty 2

## 2015-12-13 MED ORDER — POTASSIUM CHLORIDE CRYS ER 20 MEQ PO TBCR
40.0000 meq | EXTENDED_RELEASE_TABLET | Freq: Once | ORAL | Status: AC
  Administered 2015-12-13: 40 meq via ORAL
  Filled 2015-12-13: qty 2

## 2015-12-13 MED ORDER — MORPHINE SULFATE 15 MG PO TABS
7.5000 mg | ORAL_TABLET | Freq: Four times a day (QID) | ORAL | Status: DC | PRN
  Administered 2015-12-13 – 2015-12-21 (×17): 7.5 mg via ORAL
  Filled 2015-12-13 (×18): qty 1

## 2015-12-13 NOTE — Progress Notes (Signed)
Physical Therapy Treatment Patient Details Name: Wendy [name] MRN: [id number] DOB: [birth year] Today's Date: 12/13/2015    History of Present Illness Patient is a 55 y/o female with hx of severe COPD, alcoholism and depression presents with SOB. Had progressively worsening respiratory status and hypercarbia requiring intubation 3/19-3/22.    PT Comments    Pt was noted to have light headed feeling that was not from O2 sats at 100% but was having elevated pulses and needed to be observed, used RW and maintained O2 during walk.  Pt is somewhat unaware of her condition, and did not know why she had telemetry on and a monitor for O2 sats.    Follow Up Recommendations  SNF     Equipment Recommendations  Rolling walker with 5" wheels    Recommendations for Other Services OT consult     Precautions / Restrictions Precautions Precautions: Fall Precaution Comments: O2 via nasal cannula 3.5 L at baseline Restrictions Weight Bearing Restrictions: No    Mobility  Bed Mobility               General bed mobility comments: up in chair on PT arrival  Transfers Overall transfer level: Needs assistance Equipment used: Rolling walker (2 wheeled) Transfers: Sit to/from [employer] Sit to Stand: Min guard;+2 physical assistance;+2 safety/equipment Stand pivot transfers: Min guard;Min assist;+2 physical assistance;+2 safety/equipment       General transfer comment: cued hand placement and has some elevated pulses  Ambulation/Gait Ambulation/Gait assistance: Min guard;Min assist Ambulation Distance (Feet): 150 Feet Assistive device: Rolling walker (2 wheeled) Gait Pattern/deviations: Step-through pattern;Narrow base of support Gait velocity: slow Gait velocity interpretation: Below normal speed for age/gender General Gait Details: cues for RW technique   Stairs            Wheelchair Mobility    Modified Rankin (Stroke Patients Only)        Balance Overall balance assessment: Needs assistance Sitting-balance support: Feet supported Sitting balance-Leahy Scale: Good     Standing balance support: Bilateral upper extremity supported Standing balance-Leahy Scale: Poor Standing balance comment: mild LE instability and use of mask is confining to pt                    Cognition Arousal/Alertness: Awake/alert Behavior During Therapy: WFL for tasks assessed/performed Overall Cognitive Status: Within Functional Limits for tasks assessed                      Exercises      General Comments General comments (skin integrity, edema, etc.): Pt was able to walk with limited help but is feeling light headed and pulses pre were 102 and post 117.      Pertinent Vitals/Pain Pain Assessment: No/denies pain    Home Living                      Prior Function            PT Goals (current goals can now be found in the care plan section) Acute Rehab PT Goals Patient Stated Goal: to use care with walking and moving due to feeling tired Progress towards PT goals: Progressing toward goals    Frequency  Min 3X/week    PT Plan Current plan remains appropriate    Co-evaluation             End of Session Equipment Utilized During Treatment: Gait belt;Oxygen Activity Tolerance: Patient limited by fatigue Patient left:  in chair;with call bell/phone within reach     Time: 1401-1430 PT Time Calculation (min) (ACUTE ONLY): 29 min  Charges:  $Gait Training: 8-22 mins $Therapeutic Activity: 8-22 mins                    G Codes:      Ivar Drape Dec 27, 2015, 3:52 PM   Samul Dada, PT MS Acute Rehab Dept. Number: [facility] [id number] and [facility] [phone]

## 2015-12-13 NOTE — Progress Notes (Signed)
Daily Progress Note   Patient Name: Wendy [name]       Date: 12/13/2015 DOB: [birth year]  Age: 55 y.o. MRN#: [id number] Attending Physician: Kalman Shan, MD Primary Care Physician: Lora Paula, MD Admit Date: 12/04/2015  Reason for Consultation/Follow-up: Establishing goals of care, Non pain symptom management and Psychosocial/spiritual support  Subjective:  -continued conversation at bedside with patient, she "feels better today" and is stronger.  Hope is to return home with her family.  - all understand the seriousness of her illness.  Patient and family need continued education regarding plausible treatments for her illness, limitations of medical interventions and natural trajectory of ES COPD.    PMT will continue to support holistically   Length of Stay: 9 days  Current Medications: Scheduled Meds:  . ALPRAZolam  1 mg Oral BID  . antiseptic oral rinse  7 mL Mouth Rinse q12n4p  . chlorhexidine  15 mL Mouth Rinse BID  . furosemide  40 mg Oral Daily  . guaiFENesin  600 mg Oral BID  . heparin  5,000 Units Subcutaneous 3 times per day  . insulin aspart  0-5 Units Subcutaneous QHS  . insulin aspart  0-9 Units Subcutaneous TID WC  . ipratropium  0.5 mg Nebulization TID  . levalbuterol  0.63 mg Nebulization TID  . pantoprazole  40 mg Oral Q1200  . predniSONE  60 mg Oral Q breakfast  . sertraline  50 mg Oral QHS  . sodium chloride flush  10-40 mL Intracatheter Q12H    Continuous Infusions: . sodium chloride 10 mL/hr at 12/09/15 2207    PRN Meds: sodium chloride, albuterol, bisacodyl, Gerhardt's butt cream, hydrALAZINE, morphine, sodium chloride flush  Physical Exam: Physical Exam  Constitutional: She appears ill.  Cardiovascular: Tachycardia present.     Pulmonary/Chest: Tachypnea noted. She has decreased breath sounds in the right lower field and the left lower field.  Neurological: She is alert.  Skin: Skin is warm and dry.                Vital Signs: BP 141/66 mmHg  Pulse 81  Temp(Src) 99.3 F (37.4 C) (Oral)  Resp 20  Ht 5\' 1"  (1.549 m)  Wt 65.8 kg (145 lb 1 oz)  BMI 27.42 kg/m2  SpO2 100% SpO2: SpO2: 100 % O2 Device: O2  Device: Nasal Cannula O2 Flow Rate: O2 Flow Rate (L/min): 2 L/min  Intake/output summary:   Intake/Output Summary (Last 24 hours) at 12/13/15 0941 Last data filed at 12/13/15 4076  Gross per 24 hour  Intake    400 ml  Output   4025 ml  Net  -3625 ml   LBM: Last BM Date: 12/11/15 Baseline Weight: Weight: 68.947 kg (152 lb) Most recent weight: Weight: 65.8 kg (145 lb 1 oz)       Palliative Assessment/Data: Flowsheet Rows        Most Recent Value   Intake Tab    Referral Department  Critical care   Unit at Time of Referral  ICU   Palliative Care Primary Diagnosis  Pulmonary   Date Notified  12/08/15   Palliative Care Type  Return patient Palliative Care   Reason for referral  Clarify Goals of Care   Date of Admission  12/04/15   Date first seen by Palliative Care  12/08/15   # of days Palliative referral response time  0 Day(s)   # of days IP prior to Palliative referral  4   Clinical Assessment    Psychosocial & Spiritual Assessment    Palliative Care Outcomes       Additional Data Reviewed: CBC    Component Value Date/Time   WBC 10.7* 12/10/2015 0428   RBC 3.44* 12/10/2015 0428   HGB 10.3* 12/10/2015 0428   HCT 32.2* 12/10/2015 0428   PLT 317 12/10/2015 0428   MCV 93.6 12/10/2015 0428   MCH 29.9 12/10/2015 0428   MCHC 32.0 12/10/2015 0428   RDW 14.1 12/10/2015 0428   LYMPHSABS 0.7 12/10/2015 0428   MONOABS 0.7 12/10/2015 0428   EOSABS 0.0 12/10/2015 0428   BASOSABS 0.0 12/10/2015 0428    CMP     Component Value Date/Time   NA 137 12/13/2015 0419   K 3.3* 12/13/2015 0419    CL 85* 12/13/2015 0419   CO2 46* 12/13/2015 0419   GLUCOSE 84 12/13/2015 0419   BUN 11 12/13/2015 0419   CREATININE 0.59 12/13/2015 0419   CALCIUM 8.8* 12/13/2015 0419   PROT 6.3* 11/22/2015 0800   ALBUMIN 2.9* 12/05/2015 0500   AST 20 11/22/2015 0800   ALT 14 11/22/2015 0800   ALKPHOS 40 11/22/2015 0800   BILITOT 0.2* 11/22/2015 0800   GFRNONAA >60 12/13/2015 0419   GFRAA >60 12/13/2015 0419       Problem List:  Patient Active Problem List   Diagnosis Date Noted  . Influenza A 12/08/2015  . Acute on chronic respiratory failure with hypercapnia (HCC)   . DNR (do not resuscitate) discussion   . Acute respiratory failure (HCC) 12/04/2015  . Dyspnea   . Respiratory failure, acute and chronic (HCC) 11/27/2015  . Palliative care encounter   . Goals of care, counseling/discussion   . Hypercapnic respiratory failure, chronic (HCC)   . OSA (obstructive sleep apnea)   . COPD, severe (HCC)   . Immunosuppressed status (HCC)   . Pneumonia 11/22/2015  . Chronic respiratory failure with hypoxia (HCC) 11/20/2015  . Insomnia 06/05/2015  . Hoarseness 05/17/2015  . Vitamin D deficiency 11/17/2014  . Healthcare maintenance 11/17/2014  . Decreased visual acuity 11/17/2014  . Chronic diastolic heart failure (HCC) 07/02/2014  . Lung nodules 11/08/2013  . Thrombocytopenia, unspecified (HCC) 10/06/2013  . Alcohol abuse, hx of 12/01/2012    Class: Chronic  . Overdose of benzodiazepine, hx of 11/27/2012  . Alcohol withdrawal, hx of 11/27/2012  .  Psoriasis 08/18/2011  . Chronic rhinitis 11/06/2010  . Adjustment disorder with mixed anxiety and depressed mood 08/24/2010  . TOBACCO ABUSE, hx of 03/09/2008  . COPD mixed type (HCC) 03/09/2008     Palliative Care Assessment & Plan    1.Code Status:  DNR/DNI previously docuemnted    Code Status Orders        Start     Ordered   12/04/15 2347  Full code   Continuous     12/04/15 2346    Code Status History    Date Active Date  Inactive Code Status Order ID Comments User Context   11/22/2015  3:06 PM 11/30/2015  5:15 PM Full Code [id number]  Denton Brick, MD ED   07/01/2014  5:41 PM 07/04/2014  5:14 PM DNR [id number]  Esperanza Sheets, MD Inpatient   10/06/2013  1:01 AM 10/13/2013  7:25 PM Full Code [id number]  Hillary Bow, DO ED   11/27/2012  3:47 AM 12/01/2012  6:46 PM Full Code [id number]  Lupita Leash, MD Inpatient       2. Goals of Care/Additional Recommendations:    Desire for further Chaplaincy support:  Yes, spiritual care involved, HPOA secured            3. Symptom Management:      1. Dyspnea: medical mangement of chronic disease                           D/c Roxanol 5 mg po/sl every 3 hrs prn, replace with Morphine IR 7.5 mg every 6 hrs prn                            Respiratory therapy and pulmonary tolieting        2.  Anxiety: Zoloft 50 mg po q hs, patient agrees to continue                         Xanax 1 mg po bid  4. Palliative Prophylaxis:   Aspiration, Bowel Regimen, Delirium Protocol, Frequent Pain Assessment and Oral Care  5. Prognosis: Unable to determine  6. Discharge Planning:   Home with Home health once BiPap machine is secured         Care plan was discussed with Dr Jamison Neighbor  Thank you for allowing the Palliative Medicine Team to assist in the care of this patient.   Time In:  0900 Time Out: 0935 Total Time 35 min Prolonged Time Billed  no         Canary Brim, NP  12/13/2015, 9:41 AM  Please contact Palliative Medicine Team phone at [phone] for questions and concerns.

## 2015-12-13 NOTE — Progress Notes (Signed)
PULMONARY / CRITICAL CARE MEDICINE   Name: Wendy [name] MRN: [id number] DOB: [birth year]    ADMISSION DATE:  12/04/2015 CONSULTATION DATE:  12/04/15  REFERRING MD:  EDP  CHIEF COMPLAINT:  Difficulty breathing  BRIEF Wendy [name] is 63F with history of end stage COPD (FEV1 19% predicted; 0.48L in 2014) on 3.5L home O2 followed by Dr. Maple Hudson, who presented 3/19 with shortness of breath.  Had progressively worsening respiratory status and hypercarbia despite BD, steroids and ultimately required intubation in ER.    During her last admission she was seen by palliative care and started on morphine to relieve her air hunger. She was discharged on 50mg  prednisone (stop date 3/20). Her family states that they have previously discussed goals of care and that she would not want to be on mechanical ventilation for a prolonged period. She would not want tracheostomy.    STUDIES:  2D echo 3/20>>> EF 60-65%,  PASP 4/20, grade 1 diastolic dysfunction   CULTURES: Blood 3/20>> ng Urine 3/20 - no growth RVP 3/20 - nasal - Flu A + Trach aspirate bact 3/20 -   ANTIBIOTICS: Vanc 3/19>>> 3/22 Aztreonam 3/19>>>3/22 Tamiflu 3/22 >> (5 days)  LINES/TUBES: ETT 3/19>>> 3/22 L IJ CVL 3/19>>>  EVENTS 12/05/15 - Worsening hypotension overnight.  CVL placed, pressors started.  12/07/15 - went apneic on sbt per RRT but that was on dioprivan.  Flu A positive on PCR. Needed restart home xanax last night. Extubated 3/23 palliative care consut   SUBJECTIVE: Patient was found sitting on the recliner on 2L of O2 via N/C.  She used BiPAP last night.  She states that she wants to go home but is waiting for her home BiPAP.  REVIEW OF SYSTEMS: No chest pain or pressure. No subjective fever or chills.  VITAL SIGNS: BP 141/66 mmHg  Pulse 81  Temp(Src) 99.3 F (37.4 C) (Oral)  Resp 20  Ht 5\' 1"  (1.549 m)  Wt 145 lb 1 oz (65.8 kg)  BMI 27.42 kg/m2  SpO2 100%  INTAKE / OUTPUT: I/O last 3 completed  shifts: In: 580 [P.O.:580] Out: 4025 [Urine:4025]  PHYSICAL EXAMINATION: Physical Exam: Temp:  [98.8 F (37.1 C)-99.3 F (37.4 C)] 99.3 F (37.4 C) (03/28 0634) Pulse Rate:  [75-102] 81 (03/28 0745) Resp:  [15-23] 20 (03/28 0634) BP: (130-168)/(64-77) 141/66 mmHg (03/28 0634) SpO2:  [96 %-100 %] 100 % (03/28 0634) FiO2 (%):  [30 %-40 %] 30 % (03/28 0348) Weight:  [145 lb 1 oz (65.8 kg)] 145 lb 1 oz (65.8 kg) (03/28 0500)   General: General:  Pleasant white female, in no acute distress found sitting on the recliner Integument:  Warm & dry. No rashes, ecchymosis, HEENT:  Moist mucus membranes.  Atraumatic, normocephalic , no discharge, no appreciable JVD.  Pulmonary:  Symmetrically decreased breath sounds. 3l on, no wheezes, crackles Abdomen: Soft. Normal bowel sounds. Nondistended. Grossly nontender. Neurological: Alert and oriented 3. Grossly nonfocal. Moving all 4 extremities.  LABS:  PULMONARY  Recent Labs Lab 12/08/15 0036  PHART 7.338*  PCO2ART 66.6*  PO2ART 89.0  HCO3 35.8*  TCO2 38  O2SAT 96.0    CBC  Recent Labs Lab 12/08/15 0422 12/09/15 0456 12/10/15 0428  HGB 8.9* 9.9* 10.3*  HCT 28.2* 32.8* 32.2*  WBC 12.5* 12.8* 10.7*  PLT 299 356 317    COAGULATION No results for input(s): INR in the last 168 hours.  CARDIAC   No results for input(s): TROPONINI in the last 168 hours. No results for  input(s): PROBNP in the last 168 hours.   CHEMISTRY  Recent Labs Lab 12/08/15 0422 12/09/15 0456 12/10/15 0428 12/11/15 0526 12/12/15 0635 12/13/15 0419  NA 139 134* 137 137 136 137  K 4.6 4.4 4.1 3.7 3.8 3.3*  CL 95* 93* 92* 88* 87* 85*  CO2 32 33* 39* 42* 43* 46*  GLUCOSE 113* 103* 105* 86 88 84  BUN 28* 24* 22* 19 16 11   CREATININE 0.68 0.64 0.57 0.53 0.54 0.59  CALCIUM 8.4* 8.7* 8.5* 8.5* 8.5* 8.8*  MG 2.1 2.0 1.8 1.8 2.1  --   PHOS 4.7* 4.0 3.7 3.8 3.3  --    Estimated Creatinine Clearance: 67.4 mL/min (by C-G formula based on Cr of  0.59).   LIVER No results for input(s): AST, ALT, ALKPHOS, BILITOT, PROT, ALBUMIN, INR in the last 168 hours.   INFECTIOUS  Recent Labs Lab 12/07/15 0050  LATICACIDVEN 0.7     ENDOCRINE CBG (last 3)   Recent Labs  12/12/15 1634 12/12/15 2059 12/13/15 0801  GLUCAP 150* 113* 81    IMAGING x48h  - image(s) personally visualized  -   highlighted in bold No results found.  ASSESSMENT / PLAN:  Wendy [name] is a 56F w/ end stage COPD (FEV1 19% predicted in 2014) admitted with acute on chronic hypercapneic respiratory failure requiring intubation. Other medical history includes diastolic heart failure, obstructive sleep apnea and rheumatoid arthritis on Humira and prednisone. Clinically patient continues to improve. I am continuing to wean oxygen and steroids for the patient's acute respiratory failure.   A Acute on chronic hypoxic Hypercarbic respiratory failure Acute exacerbation of COPD due to influenza -A s/p extubation 12/07/15 End stage lung disease   P Continue Atrovent/xopnex/albuterol Continue prednisone  BiPAP at night  Continue morphine/Xanax encourage use of Flutter valve/incentive spirometry Tamiflu treatment completed    Bincy Varughese,AG-ACNP Pulmonary & Critical Care   PCCM Attending Note: Patient seen and examined with nurse practitioner. Please refer to her progress note which I reviewed in detail.Patient placed on BiPAP overnight at approximately 12:13 AM. Patient reports dyspnea and cough slowly improving. Continues to have chest tightness.  BP 141/66 mmHg  Pulse 81  Temp(Src) 99.3 F (37.4 C) (Oral)  Resp 20  Ht 5\' 1"  (1.549 m)  Wt 65.8 kg (145 lb 1 oz)  BMI 27.42 kg/m2  SpO2 100%  Gen.: Sitting up in chair. Family at bedside. No distress. Integument: Warm and dry. No rash on exposed skin. Pulmonary:Symmetrically diminished breath sounds. Normal work of breathing on nasal cannula oxygen.   CBC Latest Ref Rng 12/10/2015 12/09/2015 12/08/2015   WBC 4.0 - 10.5 K/uL 10.7(H) 12.8(H) 12.5(H)  Hemoglobin 12.0 - 15.0 g/dL 10.3(L) 9.9(L) 8.9(L)  Hematocrit 36.0 - 46.0 % 32.2(L) 32.8(L) 28.2(L)  Platelets 150 - 400 K/uL 317 356 299    BMP Latest Ref Rng 12/13/2015 12/12/2015 12/11/2015  Glucose 65 - 99 mg/dL 84 88 86  BUN 6 - 20 mg/dL 11 16 19   Creatinine 0.44 - 1.00 mg/dL 12/14/2015 12/13/2015  Sodium 135 - 145 mmol/L 137 136 137  Potassium 3.5 - 5.1 mmol/L 3.3(L) 3.8 3.7  Chloride 101 - 111 mmol/L 85(L) 87(L) 88(L)  CO2 22 - 32 mmol/L 46(H) 43(H) 42(H)  Calcium 8.9 - 10.3 mg/dL 2.02) 5.42) 7.06)     ASSESSMENT / PLAN: Wendy [name] is a 56F w/ end stage COPD (FEV1 19% predicted in 2014) admitted with acute on chronic hypercapneic respiratory failure requiring intubation. Status post extubation. Continuing to  slowly improve clinically.Replacing potassium. Continuing close monitoring on oral prednisone. Continuing nocturnal BiPAP.  1. Acute on Chronic Hypercarbic Respiratory Failure: Continuing treatment of COPD exacerbation. Continuing nocturnal BiPAP. 2. Acute on Chronic Hypoxic Respiratory Failure: Improving. Likely secondary to influenza pneumonia. Continue oral Lasix daily. 3. COPD Exacerbation: Continuing treatment with Xopenex & Atrovent nebulized 3 times a day. Continue Prednisone 60mg  daily. 4. End-Stage Lung Disease: Morphine PO prn. Xanax bid. Palliative medicine consulted. 5. Hypokalemia: Replace with oral potassium chloride. Repeat electrolytes in the a.m. 6. Influenza A Pneumonia: Status post treatment with Tamiflu. 7. Constipation: Resolved. 8. H/O Depression: Continuing Xanax twice a day & Zoloft daily at bedtime. 9. Diet: Regular diet. 10. Prophylaxis: Protonix by mouth daily & heparin subcutaneous every 8 hours. 11. Disposition: Likely would benefit from SNF placement. However patient wished to go home upon discharge. PT/OT consutled.  Donna Christen, M.D. [facility] Pulmonary & Critical Care Pager:   [phone] After 3pm or if no response, call [phone] 12:19 PM 12/13/2015

## 2015-12-13 NOTE — Progress Notes (Signed)
Placed patient on BIPAP 15/5 30% via [facility].  Tolerating well at this time. RN aware.

## 2015-12-13 NOTE — Progress Notes (Addendum)
Verified with two independent DME agencies that BiPap cannot be provided prior to patient having sleep study (as this pre qualification is necessary for medicare reimbursement). This is independent of diagnosis being treated, OSA or hypercapnea. Spoke with Dr Jamison Neighbor, CM asked MD to sign referral order for sleep study on front of chart at nurse's station tomorrow. MD aware that patient may not DC from hospital with BiPap equipment. [facility] to speak with patient today to assess if patient will be compliant with sleep study and pre qualifications necessary for approval. [facility] may be able to provide machine in the interim if one available. CM will continue to follow. Per [facility] rep., patient could not pay >$200 or agree to payment plan. [facility] unable to supply BiPap through [place].

## 2015-12-14 LAB — RENAL FUNCTION PANEL
Albumin: 3.4 g/dL — ABNORMAL LOW (ref 3.5–5.0)
Anion gap: 8 (ref 5–15)
BUN: 8 mg/dL (ref 6–20)
CHLORIDE: 86 mmol/L — AB (ref 101–111)
CO2: 42 mmol/L — AB (ref 22–32)
Calcium: 9.1 mg/dL (ref 8.9–10.3)
Creatinine, Ser: 0.55 mg/dL (ref 0.44–1.00)
GFR calc Af Amer: >60 mL/min (ref >60)
GFR calc non Af Amer: >60 mL/min (ref >60)
GLUCOSE: 82 mg/dL (ref 65–99)
Phosphorus: 3.7 mg/dL (ref 2.5–4.6)
Potassium: 3.6 mmol/L (ref 3.5–5.1)
Sodium: 136 mmol/L (ref 135–145)

## 2015-12-14 LAB — GLUCOSE, CAPILLARY
GLUCOSE-CAPILLARY: 103 mg/dL — AB (ref 65–99)
GLUCOSE-CAPILLARY: 122 mg/dL — AB (ref 65–99)
Glucose-Capillary: 127 mg/dL — ABNORMAL HIGH (ref 65–99)
Glucose-Capillary: 154 mg/dL — ABNORMAL HIGH (ref 65–99)
Glucose-Capillary: 157 mg/dL — ABNORMAL HIGH (ref 65–99)
Glucose-Capillary: 91 mg/dL (ref 65–99)

## 2015-12-14 LAB — MAGNESIUM: MAGNESIUM: 1.7 mg/dL (ref 1.7–2.4)

## 2015-12-14 MED ORDER — FUROSEMIDE 20 MG PO TABS
20.0000 mg | ORAL_TABLET | Freq: Every day | ORAL | Status: DC
  Administered 2015-12-15: 20 mg via ORAL
  Filled 2015-12-14: qty 1

## 2015-12-14 NOTE — Progress Notes (Signed)
PULMONARY / CRITICAL CARE MEDICINE   Name: Wendy [name] MRN: [id number] DOB: 03-Nov-1960    ADMISSION DATE:  12/04/2015 CONSULTATION DATE:  12/04/15  REFERRING MD:  EDP  CHIEF COMPLAINT:  Difficulty breathing  BRIEF Ms. Wendy [name] is 57F with history of end stage COPD (FEV1 19% predicted; 0.48L in 2014) on 3.5L home O2 followed by Dr. Maple [name], who presented 3/19 with shortness of breath.  Had progressively worsening respiratory status and hypercarbia despite BD, steroids and ultimately required intubation in ER.    During her last admission she was seen by palliative care and started on morphine to relieve her air hunger. She was discharged on 50mg  prednisone (stop date 3/20). Her family states that they have previously discussed goals of care and that she would not want to be on mechanical ventilation for a prolonged period. She would not want tracheostomy.    STUDIES:  2D echo 3/20>>> EF 60-65%,  PASP 4/20, grade 1 diastolic dysfunction   CULTURES: Blood 3/20>> ng Urine 3/20 - no growth RVP 3/20 - nasal - Flu A + Trach aspirate bact 3/20 -   ANTIBIOTICS: Vanc 3/19>>> 3/22 Aztreonam 3/19>>>3/22 Tamiflu 3/22 >> (5 days)  LINES/TUBES: ETT 3/19>>> 3/22 L IJ CVL 3/19>>>  EVENTS 12/05/15 - Worsening hypotension overnight.  CVL placed, pressors started.  12/07/15 - went apneic on sbt per RRT but that was on dioprivan.  Flu A positive on PCR. Needed restart home xanax last night. Extubated 3/23 palliative care consut   SUBJECTIVE: Patient was found sitting on the recliner on 2L of O2 via N/C.  She used BiPAP last night.  She states that she  Is feeling lot better today.  REVIEW OF SYSTEMS: No chest pain or pressure. No subjective fever or chills.  VITAL SIGNS: BP 136/73 mmHg  Pulse 80  Temp(Src) 98.4 F (36.9 C) (Oral)  Resp 16  Ht 5\' 1"  (1.549 m)  Wt 141 lb 9.6 oz (64.229 kg)  BMI 26.77 kg/m2  SpO2 94%  INTAKE / OUTPUT: I/O last 3 completed shifts: In: 190 [P.O.:160;  I.V.:30] Out: 2850 [Urine:2850]  PHYSICAL EXAMINATION: Physical Exam: Temp:  [98.4 F (36.9 C)-98.6 F (37 C)] 98.4 F (36.9 C) (03/29 0519) Pulse Rate:  [80-100] 80 (03/29 0519) Resp:  [16-17] 16 (03/29 0519) BP: (109-147)/(73-83) 136/73 mmHg (03/29 0519) SpO2:  [94 %-100 %] 94 % (03/29 0913) Weight:  [141 lb 9.6 oz (64.229 kg)] 141 lb 9.6 oz (64.229 kg) (03/29 0523)   General: General:  Pleasant white female, in no acute distress found sitting on the recliner Integument:  Warm & dry. No rashes, ecchymosis, HEENT:  Moist mucus membranes.  Atraumatic, normocephalic , no discharge, no appreciable JVD.  Pulmonary:  Symmetrically decreased breath sounds. 3l on, no wheezes, crackles Abdomen: Soft. Normal bowel sounds. Nondistended. Grossly nontender. Neurological: Alert and oriented 3. Grossly nonfocal. Moving all 4 extremities.  LABS:  PULMONARY  Recent Labs Lab 12/08/15 0036  PHART 7.338*  PCO2ART 66.6*  PO2ART 89.0  HCO3 35.8*  TCO2 38  O2SAT 96.0    CBC  Recent Labs Lab 12/08/15 0422 12/09/15 0456 12/10/15 0428  HGB 8.9* 9.9* 10.3*  HCT 28.2* 32.8* 32.2*  WBC 12.5* 12.8* 10.7*  PLT 299 356 317    COAGULATION No results for input(s): INR in the last 168 hours.  CARDIAC   No results for input(s): TROPONINI in the last 168 hours. No results for input(s): PROBNP in the last 168 hours.   CHEMISTRY  Recent Labs Lab  12/09/15 0456 12/10/15 0428 12/11/15 0526 12/12/15 0635 12/13/15 0419 12/14/15 0510  NA 134* 137 137 136 137 136  K 4.4 4.1 3.7 3.8 3.3* 3.6  CL 93* 92* 88* 87* 85* 86*  CO2 33* 39* 42* 43* 46* 42*  GLUCOSE 103* 105* 86 88 84 82  BUN 24* 22* 19 16 11 8   CREATININE 0.64 0.57 0.53 0.54 0.59 0.55  CALCIUM 8.7* 8.5* 8.5* 8.5* 8.8* 9.1  MG 2.0 1.8 1.8 2.1  --  1.7  PHOS 4.0 3.7 3.8 3.3  --  3.7   Estimated Creatinine Clearance: 66.6 mL/min (by C-G formula based on Cr of 0.55).   LIVER  Recent Labs Lab 12/14/15 0510  ALBUMIN  3.4*     INFECTIOUS No results for input(s): LATICACIDVEN, PROCALCITON in the last 168 hours.   ENDOCRINE CBG (last 3)   Recent Labs  12/13/15 2042 12/14/15 0815 12/14/15 0906  GLUCAP 157* 91 103*    IMAGING x48h  - image(s) personally visualized  -   highlighted in bold No results found.  ASSESSMENT / PLAN:  Ms. Wendy [name] is a 27F w/ end stage COPD (FEV1 19% predicted in 2014) admitted with acute on chronic hypercapneic respiratory failure requiring intubation. Other medical history includes diastolic heart failure, obstructive sleep apnea and rheumatoid arthritis on Humira and prednisone. Clinically patient continues to improve. I am continuing to wean oxygen and steroids for the patient's acute respiratory failure.   A Acute on chronic hypoxic Hypercarbic respiratory failure Acute exacerbation of COPD due to influenza -A s/p extubation 12/07/15 End stage lung disease   P Continue Atrovent/xopnex/albuterol Continue prednisone  BiPAP at night  Continue morphine/Xanax encourage use of Flutter valve/incentive spirometry Tamiflu treatment completed     Bincy Varughese,AG-ACNP Pulmonary & Critical Care  PCCM Attending Note: Patient seen and examined with nurse practitioner. Please refer to her progress note which I reviewed in detail. Patient continuing to use BiPAP nightly. Reports dyspnea and cough slowly improving.Denies any chest pain or pressure.  BP 136/73 mmHg  Pulse 80  Temp(Src) 98.4 F (36.9 C) (Oral)  Resp 16  Ht 5\' 1"  (1.549 m)  Wt 141 lb 9.6 oz (64.229 kg)  BMI 26.77 kg/m2  SpO2 94% Gen.: Sitting up in chair.No family at bedside. No distress. Integument: Warm and dry. No rash on exposed skin. Cardiovascular: Regular rate. No edema. No appreciable JVD. Pulmonary:  Symmetrically decreased breath sounds. Normal work of breathing on nasal cannula oxygen.  CBC Latest Ref Rng 12/10/2015 12/09/2015 12/08/2015  WBC 4.0 - 10.5 K/uL 10.7(H) 12.8(H) 12.5(H)   Hemoglobin 12.0 - 15.0 g/dL 10.3(L) 9.9(L) 8.9(L)  Hematocrit 36.0 - 46.0 % 32.2(L) 32.8(L) 28.2(L)  Platelets 150 - 400 K/uL 317 356 299    BMP Latest Ref Rng 12/14/2015 12/13/2015 12/12/2015  Glucose 65 - 99 mg/dL 82 84 88  BUN 6 - 20 mg/dL 8 11 16   Creatinine 0.44 - 1.00 mg/dL 12/15/2015 12/14/2015  Sodium 135 - 145 mmol/L 136 137 136  Potassium 3.5 - 5.1 mmol/L 3.6 3.3(L) 3.8  Chloride 101 - 111 mmol/L 86(L) 85(L) 87(L)  CO2 22 - 32 mmol/L 42(H) 46(H) 43(H)  Calcium 8.9 - 10.3 mg/dL 9.1 8.50) 2.77)   ABG 12/08/15:  7.34/67/89  ASSESSMENT / PLAN: Ms. Carraway is a 27F w/ end stage COPD (FEV1 19% predicted in 2014) admitted with acute on chronic hypercapneic respiratory failure requiring intubation. Status post extubation.   1. Acute on Chronic Hypercarbic Respiratory Failure: Continuing treatment of COPD  exacerbation. Continuing nocturnal BiPAP. 2. Acute on Chronic Hypoxic Respiratory Failure: Improving. Likely secondary to influenza pneumonia. Decreasing dose of Lasix to 20 mg by mouth daily. 3. COPD Exacerbation: Continuing treatment with Xopenex & Atrovent nebulized 3 times a day. Continue Prednisone 60mg  daily. 4. End-Stage Lung Disease: Morphine PO prn. Xanax bid. Palliative medicine consulted. 5. Hypokalemia: Resolved. 6. Influenza A Pneumonia: Status post treatment with Tamiflu. 7. Constipation: Resolved. 8. H/O Depression: Continuing Xanax tid prn & Zoloft daily at bedtime. 9. Diet: Regular diet. 10. Prophylaxis: Protonix by mouth daily & heparin subcutaneous every 8 hours. 11. Disposition: Likely would benefit from SNF placement. However patient wished to go home upon discharge. PT/OT consulted. Case management attempting to arrange for home BiPAP given chronic hypercarbic respiratory failureand end-stage lung disease. Likely discharged on within the next 24 hours if this can be arranged.  Donna Christen Jamison Neighbor, M.D. [facility] Pulmonary & Critical Care Pager:  [phone] After  3pm or if no response, call [phone] 11:36 AM 12/14/2015

## 2015-12-14 NOTE — Progress Notes (Signed)
Faxed order, progress notes, H&P, and face sheet to Ethelle Lyon at [facility] for BiPap to be provided to patient prior to discharge.

## 2015-12-14 NOTE — Progress Notes (Signed)
Nutrition Follow-up  DOCUMENTATION CODES:   Not applicable  INTERVENTION:   -Continue Magic Cup daily -Encourage PO intake  NUTRITION DIAGNOSIS:   Inadequate oral intake related to  (SOB) as evidenced by per patient/family report.  Progressing  GOAL:   Patient will meet greater than or equal to 90% of their needs  Progressing  MONITOR:   PO intake, Supplement acceptance, I & O's  REASON FOR ASSESSMENT:   Consult Enteral/tube feeding initiation and management  ASSESSMENT:   55 year old female w/ end stage COPD admitted with acute on chronic hypercapneic respiratory failure requiring intubation. Other medical history includes diastolic heart failure, obstructive sleep apnea and rheumatoid arthritis on Humira and prednisone.  Pt transferred from SDU to medical floor on 12/12/15.  Spoke with pt at bedside. She reports she is in good spirits today. She reveals that her appetite has improved a lot over the past week. Noted meal completion 25-75%. Pt reports she consumed cereal and a banana for breakfast this AM. She reports Magic cup is "ok",- noted pt does not like Boost or Ensure supplements.   Discussed importance of good meal intake to promote healing.   Labs reviewed: CBGS: 91-157.   Diet Order:  Diet regular Room service appropriate?: Yes; Fluid consistency:: Thin  Skin:  Wound (see comment) (MASD buttocks)  Last BM:  12/13/15  Height:   Ht Readings from Last 1 Encounters:  12/04/15 5\' 1"  (1.549 m)    Weight:   Wt Readings from Last 1 Encounters:  12/14/15 141 lb 9.6 oz (64.229 kg)    Ideal Body Weight:  47.7 kg  BMI:  Body mass index is 26.77 kg/(m^2).  Estimated Nutritional Needs:   Kcal:  1600-1800  Protein:  85-100 grams  Fluid:  > 1.6 L/day  EDUCATION NEEDS:   No education needs identified at this time  Shabree Tebbetts A. 12/16/15, RD, LDN, CDE Pager: [phone] After hours Pager: [phone]

## 2015-12-15 LAB — GLUCOSE, CAPILLARY
GLUCOSE-CAPILLARY: 83 mg/dL (ref 65–99)
GLUCOSE-CAPILLARY: 128 mg/dL — AB (ref 65–99)
GLUCOSE-CAPILLARY: 181 mg/dL — AB (ref 65–99)
GLUCOSE-CAPILLARY: 93 mg/dL (ref 65–99)

## 2015-12-15 MED ORDER — PREDNISONE 10 MG PO TABS: ORAL_TABLET | ORAL | Status: DC

## 2015-12-15 NOTE — Progress Notes (Addendum)
Physical Therapy Treatment Patient Details Name: Wendy [name] MRN: [id number] DOB: 19-Dec-1960 Today's Date: 12/15/2015    History of Present Illness Patient is a 55 y/o female with hx of severe COPD, alcoholism and depression presents with SOB. Had progressively worsening respiratory status and hypercarbia requiring intubation 3/19-3/22.    PT Comments    Pt performed increased gait distance and improved endurance with use of rollator.  Pt reports receiving rollator on last visit to hospital where she also received BSC.  Pt wil require HHPT at d/c for home safety eval atleast.  Pt will not require DME at d/c.  Informed supervising PT of patient progress.  Pt waiting on CPAP machine to d/c, orders entered for d/c.    Follow Up Recommendations  Home health PT;Supervision for mobility/OOB     Equipment Recommendations  None recommended by PT    Recommendations for Other Services       Precautions / Restrictions Precautions Precautions: Fall Precaution Comments: O2 via nasal cannula 3.5 L at baseline Restrictions Weight Bearing Restrictions: No    Mobility  Bed Mobility Overal bed mobility:  (Pt received in recliner.  )                Transfers Overall transfer level: Needs assistance Equipment used: 4-wheeled walker (Pt reports using rollator at home.  ) Transfers: Sit to/from Stand Sit to Stand: Min guard Stand pivot transfers: Min guard       General transfer comment: Cues for hand placement to push from seated surface  Ambulation/Gait Ambulation/Gait assistance: Min guard Ambulation Distance (Feet): 220 Feet Assistive device: 4-wheeled walker Gait Pattern/deviations: Step-through pattern;Trunk flexed Gait velocity: slow   General Gait Details: Cues for pacing, rest breaks and knee ext slow buckling noted in stance phase x1.     Stairs            Wheelchair Mobility    Modified Rankin (Stroke Patients Only)       Balance     Sitting  balance-Leahy Scale: Good       Standing balance-Leahy Scale: Fair                      Cognition Arousal/Alertness: Awake/alert Behavior During Therapy: WFL for tasks assessed/performed Overall Cognitive Status: Within Functional Limits for tasks assessed                      Exercises      General Comments        Pertinent Vitals/Pain Pain Assessment: No/denies pain    Home Living                      Prior Function            PT Goals (current goals can now be found in the care plan section) Acute Rehab PT Goals Patient Stated Goal: go home and feel better Potential to Achieve Goals: Good Progress towards PT goals: Progressing toward goals    Frequency  Min 3X/week    PT Plan Current plan remains appropriate    Co-evaluation             End of Session Equipment Utilized During Treatment: Gait belt;Oxygen Activity Tolerance: Patient limited by fatigue Patient left: in chair;with call bell/phone within reach     Time: 1533-1556 PT Time Calculation (min) (ACUTE ONLY): 23 min  Charges:  $Gait Training: 8-22 mins $Therapeutic Activity: 8-22 mins  G Codes:      Florestine Avers 12/15/2015, 4:08 PM  Joycelyn Rua, PTA pager [phone]

## 2015-12-15 NOTE — Progress Notes (Signed)
CM on phone w/ [employer] for 30 minutes requesting expedited approval of home non invasive vent NIV. Spoke with Byrd Hesselbach she stated that approval will take no longer than 72 hours. CM asked [employer] if they would be able to provide NIV prior to auth, they could not. CM updated medical advisor Dr Jacky Kindle of situation. He called insurance to further expedite approval and hopes that approval can be obtained some time tomorrow so patient can be discharged. Patient updated and PCCS PA updated.

## 2015-12-15 NOTE — Progress Notes (Signed)
Per Ethelle Lyon at [facility] who is supplying the home Bipap (non invasive vent.) we are awaiting [employer] approval for device. Auth started last night, not received at this time. Set up cannot be done prior to approval per Selena Batten. CM will update Dr Jamison Neighbor and patient.

## 2015-12-15 NOTE — Discharge Summary (Signed)
Physician Discharge Summary  Patient ID: Wendy [name] MRN: [id number] DOB/AGE: 57/01/1961 57 y.o.  Admit date: 12/04/2015 Discharge date: 12/15/2015    Discharge Diagnoses:  Acute on Chronic Respiratory failure                                           COPD exacerbation                                          End Stage lung disease                                                                     DISCHARGE PLAN BY DIAGNOSIS    1. Acute on Chronic Hypercarbic Respiratory Failure: Continuing treatment of COPD exacerbation. Continuing nocturnal BiPAP. 2. Acute on Chronic Hypoxic Respiratory Failure: Improving. Likely secondary to influenza pneumonia. Stop Lasix. 3. COPD Exacerbation: Continuing treatment with Xopenex & Atrovent nebulized 3 times a day. Continue Prednisone 13m daily with a 2 week taper. 4. End-Stage Lung Disease: Morphine PO prn. Xanax bid. Palliative medicine consulted. 5. Hypokalemia: Resolved. 6. Influenza A Pneumonia: Status post treatment with Tamiflu. 7. Constipation: Resolved. 8. H/O Depression: Continuing Xanax tid prn & Zoloft daily at bedtime. 9. Diet: Regular diet. 10. Prophylaxis: Protonix by mouth daily & heparin subcutaneous every 8 hours. 11. Disposition: Likely would benefit from SNF placement. However patient wished to go home upon discharge. Plan for D/C to home today if BiPAP arranged w/ home health nurse & close followup.                   DISCHARGE SUMMARY   Wendy BUTSONis a 55y.o. y/o female with a PMH of of CHF(grade1 diastolic dysfunctionEF 537-62%09/2013), COPD on home oxygen, and depression who presents on 3/7 with PNA.  She was treated with broad spectrum antibiotics,She continued to improve. On 3/11she was short of breath and was placed on VM, and then required BiPAP.  Patient was hard to wean off from BiPAP and required intubation on 3/19.  Patient was extubated on 3/22.  Palliative care team was consulted on 3/23. Patient  expressed that she would not want to go to SNF and would rather go home.  Arrangements for her home BiPAP was made by th case worker.       SIGNIFICANT DIAGNOSTIC STUDIES 2D echo 3/20>>> EF 60-65%, PASP 383TDVV grade 1 diastolic dysfunction   SIGNIFICANT EVENTS 12/05/15 - Worsening hypotension overnight. CVL placed, pressors started.  12/07/15 - went apneic on sbt per RRT but that was on dioprivan. Flu A positive on PCR. Needed restart home xanax last night. Extubated  MICRO DATA  Blood 3/20>> ng Urine 3/20 - no growth RVP 3/20 - nasal - Flu A + Trach aspirate bact 3/20 -    ANTIBIOTICS Vanc 3/19>>> 3/22 Aztreonam 3/19>>>3/22 Tamiflu 3/22 >> (5 days)  CONSULTS  3/23>Palliative care  TUBES / LINES  ETT 3/19>>> 3/22 L IJ CVL 3/19>>>  Discharge Exam: General: Awake. Sister at bedside. Sitting in chair. Integument:  Warm & dry. No rashes on exposed skin. HEENT: Moist mucus membranes. No oral ulcers. Pulmonary: Symmetrically decreased BS bilaterally. Normal work of breathing on [place] oxygen. Abdomen: Soft. Normal bowel sounds. Nondistended. Grossly nontender. Neurological: Alert and oriented 3. Grossly nonfocal. Moving all 4 extremities   Filed Vitals:   12/15/15 0500 12/15/15 0700 12/15/15 0857 12/15/15 1329  BP:      Pulse:      Temp:      TempSrc:      Resp:      Height:      Weight: 138 lb 9.6 oz (62.869 kg)     SpO2:  96% 96% 97%     Discharge Labs  BMET  Recent Labs Lab 12/09/15 0456 12/10/15 0428 12/11/15 0526 12/12/15 0635 12/13/15 0419 12/14/15 0510  NA 134* 137 137 136 137 136  K 4.4 4.1 3.7 3.8 3.3* 3.6  CL 93* 92* 88* 87* 85* 86*  CO2 33* 39* 42* 43* 46* 42*  GLUCOSE 103* 105* 86 88 84 82  BUN 24* 22* 19 16 11 8   CREATININE 0.64 0.57 0.53 0.54 0.59 0.55  CALCIUM 8.7* 8.5* 8.5* 8.5* 8.8* 9.1  MG 2.0 1.8 1.8 2.1  --  1.7  PHOS 4.0 3.7 3.8 3.3  --  3.7    CBC  Recent Labs Lab 12/09/15 0456 12/10/15 0428  HGB 9.9* 10.3*  HCT  32.8* 32.2*  WBC 12.8* 10.7*  PLT 356 317    Anti-Coagulation No results for input(s): INR in the last 168 hours.  Discharge Instructions    Call MD for:  difficulty breathing, headache or visual disturbances    Complete by:  As directed      Call MD for:  persistant nausea and vomiting    Complete by:  As directed      Call MD for:  severe uncontrolled pain    Complete by:  As directed      Call MD for:  temperature >100.4    Complete by:  As directed      Diet - low sodium heart healthy    Complete by:  As directed      Increase activity slowly    Complete by:  As directed                 Follow-up Information    Follow up with Rexene Edison, NP On 12/19/2015.   Specialty:  Pulmonary Disease   Why:  11:30  Pulmonary 2nd floor   Contact information:   [address] [place] [address] [phone]          Medication List    TAKE these medications        Adalimumab 40 MG/0.8ML Pnkt  Commonly known as:  HUMIRA PEN-CROHNS STARTER  Inject 40 mg into the skin every 14 (fourteen) days.     ADVAIR DISKUS 500-50 MCG/DOSE Aepb  Generic drug:  Fluticasone-Salmeterol  INHALE 1 PUFF INTO THE LUNGS 2 TIMES DAILY     albuterol (2.5 MG/3ML) 0.083% nebulizer solution  Commonly known as:  PROVENTIL  Take 2.5 mg by nebulization every 6 (six) hours as needed for wheezing or shortness of breath.     albuterol 108 (90 Base) MCG/ACT inhaler  Commonly known as:  PROAIR HFA  2 puffs every 4 hours as needed- rescue     ALPRAZolam 1 MG tablet  Commonly known as:  XANAX  1 tab, three times daily as needed     chlorpheniramine-HYDROcodone 10-8  MG/5ML Suer  Commonly known as:  TUSSIONEX  Take 5 mLs by mouth every 12 (twelve) hours.     fluticasone 50 MCG/ACT nasal spray  Commonly known as:  FLONASE  PLACE 2 SPRAYS INTO BOTH NOSTRILS DAILY.     morphine CONCENTRATE 10 MG/0.5ML Soln concentrated solution  Take 0.25 mLs (5 mg total) by mouth every 4 (four) hours  as needed for anxiety or shortness of breath.     pantoprazole 20 MG tablet  Commonly known as:  PROTONIX  TAKE 1 TABLET (20 MG TOTAL) BY MOUTH DAILY BEFORE SUPPER.     predniSONE 10 MG tablet  Commonly known as:  DELTASONE  Take 6 tabs PO daily x3 days,  Then Take 5 tabs PO daily x3days then 40 mg PO x3days, then 70m POx 3 days, then 2101mx three days,then return to normal dose 1035maily. #60     SPIRIVA HANDIHALER 18 MCG inhalation capsule  Generic drug:  tiotropium  PLACE 1 CAPSULE (18 MCG TOTAL) INTO INHALER AND INHALE DAILY.     theophylline 400 MG 24 hr tablet  Commonly known as:  UNIPHYL  Take 0.5 tablets (200 mg total) by mouth 2 (two) times daily with a meal.     traZODone 50 MG tablet  Commonly known as:  DESYREL  Take 1 tablet (50 mg total) by mouth at bedtime.         Disposition:  Home  Discharged Condition: PatMARENA WITTSs met maximum benefit of inpatient care and is medically stable and cleared for discharge.  Patient is pending follow up as above.      Time spent on disposition:  Greater than 45 minutes.      [facility]

## 2015-12-15 NOTE — Hospital Discharge Follow-Up (Signed)
[facility] at [place]:  This Case Manager spoke with Carles Collet, RN CM, and it was determined patient may benefit from follow-up with the [facility] [place] after discharge. PCP: Dr. Adrian Blackwater. This Case Manager met with patient at bedside and thoroughly discussed the follow-up and medical management provided with the [facility] and [facility]. Patient declined Transitional Care Clinic services, and indicated she already has enough follow-up in place. She indicated she plans to follow-up with Dr. Baird Lyons after discharge and will also have [facility] RN/PT services with [place].  This Case Manager provided patient with [facility] contact information in case additional follow-up needed after discharge. Patient appreciative of information. Carles Collet, RN CM updated.

## 2015-12-15 NOTE — Progress Notes (Signed)
PULMONARY / CRITICAL CARE MEDICINE   Name: Wendy [name] MRN: [id number] DOB: 1961-03-14    ADMISSION DATE:  12/04/2015 CONSULTATION DATE:  12/04/15  REFERRING MD:  EDP  CHIEF COMPLAINT:  Difficulty breathing  BRIEF Ms. 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 Maple [name], who presented 3/19 with shortness of breath.  Had progressively worsening respiratory status and hypercarbia despite BD, steroids and ultimately required intubation in ER.    During her last admission she was seen by palliative care and started on morphine to relieve her air hunger. She was discharged on 50mg  prednisone (stop date 3/20). Her family states that they have previously discussed goals of care and that she would not want to be on mechanical ventilation for a prolonged period. She would not want tracheostomy.    STUDIES:  2D echo 3/20>>> EF 60-65%,  PASP 4/20, grade 1 diastolic dysfunction   CULTURES: Blood 3/20>> ng Urine 3/20 - no growth RVP 3/20 - nasal - Flu A + Trach aspirate bact 3/20 -   ANTIBIOTICS: Vanc 3/19>>> 3/22 Aztreonam 3/19>>>3/22 Tamiflu 3/22 >> (5 days)  LINES/TUBES: ETT 3/19>>> 3/22 L IJ CVL 3/19>>>  EVENTS 12/05/15 - Worsening hypotension overnight.  CVL placed, pressors started.  12/07/15 - went apneic on sbt per RRT but that was on dioprivan.  Flu A positive on PCR. Needed restart home xanax last night. Extubated 3/23 palliative care consut   SUBJECTIVE: Cough now producing a white-yellowish mucus. Dyspnea improving. No nausea or emesis.  REVIEW OF SYSTEMS: No chest pain or pressure. No fever, chills, or sweats.  VITAL SIGNS: BP 153/77 mmHg  Pulse 84  Temp(Src) 98.2 F (36.8 C) (Oral)  Resp 20  Ht 5\' 1"  (1.549 m)  Wt 138 lb 9.6 oz (62.869 kg)  BMI 26.20 kg/m2  SpO2 96%  INTAKE / OUTPUT: I/O last 3 completed shifts: In: 180 [P.O.:120; I.V.:60] Out: 1900 [Urine:1900]  PHYSICAL  EXAMINATION: Physical Exam: Temp:  [98.2 F (36.8 C)] 98.2 F (36.8 C) (03/29 2318) Pulse Rate:  [83-87] 84 (03/30 0439) Resp:  [18-20] 20 (03/30 0439) BP: (129-153)/(76-80) 153/77 mmHg (03/30 0439) SpO2:  [95 %-100 %] 96 % (03/30 0857) FiO2 (%):  [30 %] 30 % (03/30 0032) Weight:  [138 lb 9.6 oz (62.869 kg)] 138 lb 9.6 oz (62.869 kg) (03/30 0500)   General: General:  Awake. Sister at bedside. Sitting in chair. Integument:  Warm & dry. No rashes on exposed skin. HEENT:  Moist mucus membranes.  No oral ulcers. Pulmonary:  Symmetrically decreased BS bilaterally. Normal work of breathing on [place] oxygen. Abdomen: Soft. Normal bowel sounds. Nondistended. Grossly nontender. Neurological: Alert and oriented 3. Grossly nonfocal. Moving all 4 extremities.  LABS:  PULMONARY No results for input(s): PHART, PCO2ART, PO2ART, HCO3, TCO2, O2SAT in the last 168 hours.  Invalid input(s): PCO2, PO2  CBC  Recent Labs Lab 12/09/15 0456 12/10/15 0428  HGB 9.9* 10.3*  HCT 32.8* 32.2*  WBC 12.8* 10.7*  PLT 356 317    COAGULATION No results for input(s): INR in the last 168 hours.  CARDIAC   No results for input(s): TROPONINI in the last 168 hours. No results for input(s): PROBNP in the last 168 hours.   CHEMISTRY  Recent Labs Lab 12/09/15 0456 12/10/15 0428 12/11/15 0526 12/12/15 0635 12/13/15 0419 12/14/15 0510  NA 134* 137 137 136 137 136  K 4.4 4.1 3.7 3.8 3.3* 3.6  CL 93*  92* 88* 87* 85* 86*  CO2 33* 39* 42* 43* 46* 42*  GLUCOSE 103* 105* 86 88 84 82  BUN 24* 22* 19 16 11 8   CREATININE 0.64 0.57 0.53 0.54 0.59 0.55  CALCIUM 8.7* 8.5* 8.5* 8.5* 8.8* 9.1  MG 2.0 1.8 1.8 2.1  --  1.7  PHOS 4.0 3.7 3.8 3.3  --  3.7   Estimated Creatinine Clearance: 65.9 mL/min (by C-G formula based on Cr of 0.55).   LIVER  Recent Labs Lab 12/14/15 0510  ALBUMIN 3.4*     INFECTIOUS No results for input(s): LATICACIDVEN, PROCALCITON in the last 168 hours.   ENDOCRINE CBG  (last 3)   Recent Labs  12/14/15 1805 12/14/15 2352 12/15/15 0750  GLUCAP 127* 154* 83    IMAGING x48h  - image(s) personally visualized  -   highlighted in bold No results found.  ASSESSMENT / PLAN: Ms. Wendy [name] is a 1F w/ end stage COPD (FEV1 19% predicted in 2014) admitted with acute on chronic hypercapneic respiratory failure requiring intubation. Status post extubation. Clinically improving with current nebulizer & steroid regimen. Patient may be able to d/c home today if BiPAP is available.  1. Acute on Chronic Hypercarbic Respiratory Failure: Continuing treatment of COPD exacerbation. Continuing nocturnal BiPAP. 2. Acute on Chronic Hypoxic Respiratory Failure: Improving. Likely secondary to influenza pneumonia. Stop Lasix. 3. COPD Exacerbation: Continuing treatment with Xopenex & Atrovent nebulized 3 times a day. Continue Prednisone 60mg  daily with a 2 week taper. 4. End-Stage Lung Disease: Morphine PO prn. Xanax bid. Palliative medicine consulted. 5. Hypokalemia: Resolved. 6. Influenza A Pneumonia: Status post treatment with Tamiflu. 7. Constipation: Resolved. 8. H/O Depression: Continuing Xanax tid prn & Zoloft daily at bedtime. 9. Diet: Regular diet. 10. Prophylaxis: Protonix by mouth daily & heparin subcutaneous every 8 hours. 11. Disposition: Likely would benefit from SNF placement. However patient wished to go home upon discharge. Plan for D/C to home today if BiPAP arranged w/ home health nurse & close followup.  2015 , M.D. [facility] Pulmonary & Critical Care Pager:  [phone] After 3pm or if no response, call [phone] 10:18 AM 12/15/2015

## 2015-12-16 DIAGNOSIS — J9601 Acute respiratory failure with hypoxia: Secondary | ICD-10-CM

## 2015-12-16 LAB — GLUCOSE, CAPILLARY
GLUCOSE-CAPILLARY: 85 mg/dL (ref 65–99)
GLUCOSE-CAPILLARY: 127 mg/dL — AB (ref 65–99)
Glucose-Capillary: 143 mg/dL — ABNORMAL HIGH (ref 65–99)
Glucose-Capillary: 132 mg/dL — ABNORMAL HIGH (ref 65–99)

## 2015-12-16 NOTE — Progress Notes (Signed)
Patient placed on BIPAP HS with no complications. 

## 2015-12-16 NOTE — Progress Notes (Signed)
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 Maple Hudson, who presented 3/19 with shortness of breath.  Had progressively worsening respiratory status and hypercarbia despite BD, steroids and ultimately required intubation in ER.    During her last admission she was seen by palliative care and started on morphine to relieve her air hunger. She was discharged on 50mg  prednisone (stop date 3/20). Her family states that they have previously discussed goals of care and that she would not want to be on mechanical ventilation for a prolonged period. She would not want tracheostomy.    STUDIES:  2D echo 3/20>>> EF 60-65%,  PASP 4/20, grade 1 diastolic dysfunction   CULTURES: Blood 3/20>> ng Urine 3/20 - no growth RVP 3/20 - nasal - Flu A + Trach aspirate bact 3/20 -   ANTIBIOTICS: Vanc 3/19>>> 3/22 Aztreonam 3/19>>>3/22 Tamiflu 3/22 >> (5 days)  LINES/TUBES: ETT 3/19>>> 3/22 L IJ CVL 3/19>>>  EVENTS 12/05/15 - Worsening hypotension overnight.  CVL placed, pressors started.  12/07/15 - went apneic on sbt per RRT but that was on dioprivan.  Flu A positive on PCR. Needed restart home xanax last night. Extubated 3/23 palliative care consut   SUBJECTIVE: No events overnight. Cough now producing clear mucus. No fever or chills. Dyspnea stable but did have some mild dyspnea that was transient and likely related to anxiety this morning.  REVIEW OF SYSTEMS: No chest pain or pressure. No nausea, emesis, or abdominal pain.  VITAL SIGNS: BP 147/68 mmHg  Pulse 77  Temp(Src) 97.9 F (36.6 C) (Oral)  Resp 20  Ht 5\' 1"  (1.549 m)  Wt 145 lb 15.1 oz (66.2 kg)  BMI 27.59 kg/m2  SpO2 94%  INTAKE / OUTPUT: I/O  last 3 completed shifts: In: 510 [P.O.:480; I.V.:30] Out: 400 [Urine:400]  PHYSICAL EXAMINATION: Physical Exam: Temp:  [97.6 F (36.4 C)-98.8 F (37.1 C)] 97.9 F (36.6 C) (03/31 0642) Pulse Rate:  [74-91] 77 (03/31 0642) Resp:  [12-20] 20 (03/31 0642) BP: (116-147)/(50-68) 147/68 mmHg (03/31 0642) SpO2:  [94 %-100 %] 94 % (03/31 1108) Weight:  [145 lb 15.1 oz (66.2 kg)] 145 lb 15.1 oz (66.2 kg) (03/31 02-19-1989)   General: General:  Awake. Family at bedside. Alert. Integument:  Warm & dry. No rashes on exposed skin. HEENT:  Moist mucus membranes.  No oral ulcers. Pulmonary:  Decreased BS bilaterally stable. Normal work of breathing on [place] oxygen. Abdomen: Soft. Normal bowel sounds. Nontender. Neurological: Alert and oriented 3. Grossly nonfocal. Moving all 4 extremities.  LABS:  PULMONARY No results for input(s): PHART, PCO2ART, PO2ART, HCO3, TCO2, O2SAT in the last 168 hours.  Invalid input(s): PCO2, PO2  CBC  Recent Labs Lab 12/10/15 0428  HGB 10.3*  HCT 32.2*  WBC 10.7*  PLT 317    COAGULATION No results for input(s): INR in the last 168 hours.  CARDIAC   No results for input(s): TROPONINI in the last 168 hours. No results for input(s): PROBNP in the last 168 hours.   CHEMISTRY  Recent Labs Lab 12/10/15 0428 12/11/15 0526 12/12/15 0635 12/13/15 0419 12/14/15 0510  NA 137 137 136 137 136  K 4.1 3.7 3.8 3.3* 3.6  CL 92* 88*  87* 85* 86*  CO2 39* 42* 43* 46* 42*  GLUCOSE 105* 86 88 84 82  BUN 22* 19 16 11 8   CREATININE 0.57 0.53 0.54 0.59 0.55  CALCIUM 8.5* 8.5* 8.5* 8.8* 9.1  MG 1.8 1.8 2.1  --  1.7  PHOS 3.7 3.8 3.3  --  3.7   Estimated Creatinine Clearance: 67.6 mL/min (by C-G formula based on Cr of 0.55).   LIVER  Recent Labs Lab 12/14/15 0510  ALBUMIN 3.4*     INFECTIOUS No results for input(s): LATICACIDVEN, PROCALCITON in the last 168 hours.   ENDOCRINE CBG (last 3)   Recent Labs  12/15/15 2146 12/16/15 0747  12/16/15 1135  GLUCAP 93 85 127*    IMAGING x48h  - image(s) personally visualized  -   highlighted in bold No results found.  ASSESSMENT / PLAN: Wendy [name] is a 31F w/ end stage COPD (FEV1 19% predicted in 2014) admitted with acute on chronic hypercapneic respiratory failure requiring intubation. Status post extubation. Clinically improving with current nebulizer & steroid regimen. Awaiting home BiPAP. Oxygen requirement relatively stable off Lasix. Patient is ambulating & mobile.   1. Acute on Chronic Hypercarbic Respiratory Failure: Continuing treatment of COPD exacerbation. Continuing nocturnal BiPAP. 2. Acute on Chronic Hypoxic Respiratory Failure: Improving. Likely secondary to influenza pneumonia. Weaning FiO2 as tolerated. 3. COPD Exacerbation: Continuing treatment with Xopenex & Atrovent nebulized 3 times a day. Continue Prednisone 60mg  daily with a 2 week taper. 4. End-Stage Lung Disease: Morphine PO prn. Xanax bid. Palliative medicine consulted. 5. Hypokalemia: Resolved. 6. Influenza A Pneumonia: Status post treatment with Tamiflu. 7. Constipation: Resolved. 8. H/O Depression: Continuing Xanax tid prn & Zoloft daily at bedtime. 9. Diet: Regular diet. 10. Prophylaxis: Protonix by mouth daily & heparin subcutaneous every 8 hours. 11. Disposition: Likely would benefit from SNF placement. However patient wished to go home upon discharge. Plan for D/C to home today if BiPAP arranged w/ home health nurse & close followup.  2015 , M.D. [facility] Pulmonary & Critical Care Pager:  [phone] After 3pm or if no response, call [phone] 12:27 PM 12/16/2015

## 2015-12-16 NOTE — Progress Notes (Signed)
Faxed clinicals, orders, labs and demographics Wendy [name] at [facility] for Wendy [name] of BiPap/ Home [phone].

## 2015-12-16 NOTE — Care Management Important Message (Signed)
Important Message  Patient Details  Name: Wendy [name] MRN: [id number] Date of Birth: Mar 10, 1961   Medicare Important Message Given:  Yes    Lawerance Sabal, RN 12/16/2015, 8:18 AMImportant Message  Patient Details  Name: Wendy [name] MRN: [id number] Date of Birth: 08-06-1961   Medicare Important Message Given:  Yes    Lawerance Sabal, RN 12/16/2015, 8:17 AM

## 2015-12-17 LAB — GLUCOSE, CAPILLARY
GLUCOSE-CAPILLARY: 191 mg/dL — AB (ref 65–99)
Glucose-Capillary: 93 mg/dL (ref 65–99)
Glucose-Capillary: 125 mg/dL — ABNORMAL HIGH (ref 65–99)
Glucose-Capillary: 145 mg/dL — ABNORMAL HIGH (ref 65–99)

## 2015-12-17 NOTE — Progress Notes (Signed)
Informed by central tele that pt had 5 beats of V. Tach.  VSS.  When pt assessed, pt stated "I do feel like my heart is beating fast," and requested her PRN ativan.  Paged MD on-call.  Will continue to monitor.

## 2015-12-17 NOTE — Progress Notes (Signed)
PULMONARY / CRITICAL CARE MEDICINE   Name: Wendy [name] MRN: [id number] DOB: Mar 02, 1961    ADMISSION DATE:  12/04/2015 CONSULTATION DATE:  12/04/15  REFERRING MD:  EDP  CHIEF COMPLAINT:  Difficulty breathing  BRIEF Ms. 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 Maple [name], who presented 3/19 with shortness of breath.  Had progressively worsening respiratory status and hypercarbia despite BD, steroids and ultimately required intubation in ER.    During her last admission she was seen by palliative care and started on morphine to relieve her air hunger. She was discharged on 50mg  prednisone (stop date 3/20). Her family states that they have previously discussed goals of care and that she would not want to be on mechanical ventilation for a prolonged period. She would not want tracheostomy.    STUDIES:  2D echo 3/20>>> EF 60-65%,  PASP 4/20, grade 1 diastolic dysfunction   CULTURES: Blood 3/20>> ng Urine 3/20 - no growth RVP 3/20 - nasal - Flu A + Trach aspirate bact 3/20 -   ANTIBIOTICS: Vanc 3/19>>> 3/22 Aztreonam 3/19>>>3/22 Tamiflu 3/22 >> (5 days)  LINES/TUBES: ETT 3/19>>> 3/22 L IJ CVL 3/19>>>  EVENTS 12/05/15 - Worsening hypotension overnight.  CVL placed, pressors started.  12/07/15 - went apneic on sbt per RRT but that was on dioprivan.  Flu A positive on PCR. Needed restart home xanax last night. Extubated 3/23 palliative care consut   SUBJECTIVE:   Congested rattly cough but no sob at rest/ mucus mosly white      VITAL SIGNS: BP 165/90 mmHg  Pulse 91  Temp(Src) 98.4 F (36.9 C) (Oral)  Resp 18  Ht 5\' 1"  (1.549 m)  Wt 63.73 kg (140 lb 8 oz)  BMI 26.56 kg/m2  SpO2 95%  INTAKE / OUTPUT: I/O last 3 completed shifts: In: 750 [P.O.:720; I.V.:30] Out: 1050 [Urine:1050]  PHYSICAL EXAMINATION: Physical Exam: Temp:  [98 F (36.7 C)-98.4 F (36.9 C)] 98.4 F (36.9 C) (04/01 0621) Pulse  Rate:  [78-91] 91 (04/01 0621) Resp:  [15-19] 18 (04/01 0621) BP: (139-165)/(74-90) 165/90 mmHg (04/01 0621) SpO2:  [92 %-98 %] 95 % (04/01 0905) Weight:  [63.73 kg (140 lb 8 oz)] 63.73 kg (140 lb 8 oz) (04/01 06-17-1994)   General: General:  Awake. Family at bedside. Alert. Integument:  Warm & dry. No rashes on exposed skin. HEENT:  Moist mucus membranes.  No oral ulcers. Pulmonary:  Decreased BS bilaterally stable. Normal work of breathing on [place] oxygen. Abdomen: Soft. Normal bowel sounds. Nontender. Neurological: Alert and oriented 3. Grossly nonfocal. Moving all 4 extremities.  LABS:  PULMONARY No results for input(s): PHART, PCO2ART, PO2ART, HCO3, TCO2, O2SAT in the last 168 hours.  Invalid input(s): PCO2, PO2  CBC No results for input(s): HGB, HCT, WBC, PLT in the last 168 hours.  COAGULATION No results for input(s): INR in the last 168 hours.  CARDIAC   No results for input(s): TROPONINI in the last 168 hours. No results for input(s): PROBNP in the last 168 hours.   CHEMISTRY  Recent Labs Lab 12/11/15 0526 12/12/15 0635 12/13/15 0419 12/14/15 0510  NA 137 136 137 136  K 3.7 3.8 3.3* 3.6  CL 88* 87* 85* 86*  CO2 42* 43* 46* 42*  GLUCOSE 86 88 84 82  BUN 19 16 11 8   CREATININE 0.53 0.54 0.59 0.55  CALCIUM 8.5* 8.5* 8.8* 9.1  MG 1.8 2.1  --  1.7  PHOS 3.8 3.3  --  3.7   Estimated Creatinine Clearance: 66.4 mL/min (by C-G formula based on Cr of 0.55).   LIVER  Recent Labs Lab 12/14/15 0510  ALBUMIN 3.4*     INFECTIOUS No results for input(s): LATICACIDVEN, PROCALCITON in the last 168 hours.   ENDOCRINE CBG (last 3)   Recent Labs  12/16/15 1613 12/16/15 2121 12/17/15 0740  GLUCAP 143* 132* 93    IMAGING x48h  - image(s) personally visualized  -   highlighted in bold No results found.  ASSESSMENT / PLAN: Ms. Wendy [name] is a 66F w/ end stage COPD (FEV1 19% predicted in 2014) admitted with acute on chronic hypercapneic/hypoxemic  respiratory  failure requiring intubation. Status post extubation. Clinically improving with current nebulizer & steroid regimen. Awaiting home BiPAP. Oxygen requirement relatively stable off Lasix. Patient is ambulating & mobile.   1. Acute on Chronic Hypercarbic Respiratory Failure: Continuing treatment of COPD exacerbation. Continuing nocturnal BiPAP. 2. Acute on Chronic Hypoxic Respiratory Failure: Improving. Likely secondary to influenza pneumonia. Weaning FiO2 as tolerated. 3. COPD Exacerbation: Continuing treatment with Xopenex & Atrovent nebulized 3 times a day. Continue Prednisone 60mg  daily with a 2 week taper. 4. End-Stage Lung Disease: Morphine PO prn. Xanax bid. Palliative medicine consulted.  5     Depression: Continuing Xanax tid prn & Zoloft daily at bedtime.  5. Diet: Regular diet. 6. Prophylaxis: Protonix by mouth daily & heparin subcutaneous every 8 hours. 7.  . Plan for D/C to home 4/3  if BiPAP arranged w/ home health nurse & close followup.     6/3, MD Pulmonary and Critical Care Medicine [facility] Cell [phone] After 5:30 PM or weekends, call [phone]

## 2015-12-18 LAB — BLOOD GAS, ARTERIAL
Acid-Base Excess: 9.6 mmol/L — ABNORMAL HIGH (ref 0.0–2.0)
Bicarbonate: 34.9 mEq/L — ABNORMAL HIGH (ref 20.0–24.0)
Drawn by: 24513
O2 Content: 2 L/min
O2 Saturation: 95.6 %
PCO2 ART: 60.7 mmHg — AB (ref 35.0–45.0)
PO2 ART: 85.2 mmHg (ref 80.0–100.0)
Patient temperature: 98.6
TCO2: 36.8 mmol/L (ref 0–100)
pH, Arterial: 7.378 (ref 7.350–7.450)

## 2015-12-18 LAB — GLUCOSE, CAPILLARY
GLUCOSE-CAPILLARY: 85 mg/dL (ref 65–99)
GLUCOSE-CAPILLARY: 111 mg/dL — AB (ref 65–99)
Glucose-Capillary: 173 mg/dL — ABNORMAL HIGH (ref 65–99)

## 2015-12-18 MED ORDER — PREDNISONE 20 MG PO TABS
40.0000 mg | ORAL_TABLET | Freq: Every day | ORAL | Status: DC
  Administered 2015-12-19 – 2015-12-21 (×3): 40 mg via ORAL
  Filled 2015-12-18 (×4): qty 2

## 2015-12-18 MED ORDER — GUAIFENESIN ER 600 MG PO TB12
1200.0000 mg | ORAL_TABLET | Freq: Two times a day (BID) | ORAL | Status: DC
  Administered 2015-12-18 – 2015-12-21 (×6): 1200 mg via ORAL
  Filled 2015-12-18 (×7): qty 2

## 2015-12-18 NOTE — Progress Notes (Signed)
CRITICAL VALUE ALERT  Critical value received:  PCO2 60.7  Date of notification:  12/18/2015  Time of notification:  2008  Critical value read back:Yes.    Nurse who received alert:  Idelle Jo  MD notified (1st page):  Critical Care pager [phone]  Time of first page:  2024  MD notified (2nd page):  Time of second page:  Responding MD:  Dr. Katrinka Blazing  Time MD responded:  2026  No new orders at this time. Will continue to monitor patient.

## 2015-12-18 NOTE — Progress Notes (Signed)
eLink Physician-Brief Progress Note Patient Name: Wendy [name] DOB: [birth year] MRN: [id number]   Date of Service  12/18/2015  HPI/Events of Note  Patient c/o mild dyspnea and patient thinks she might be retaining CO2.  Nurse states lungs diminished on exam but the patient is not in distress.  eICU Interventions  abg Patient has prn treatments.  Nurse to have RT give     Intervention Category Intermediate Interventions: Other:  Henry Russel, Demetrius Charity 12/18/2015, 6:24 PM

## 2015-12-18 NOTE — Progress Notes (Signed)
PULMONARY / CRITICAL CARE MEDICINE   Name: Wendy [name] MRN: [id number] DOB: 25-Jun-1961    ADMISSION DATE:  12/04/2015 CONSULTATION DATE:  12/04/15  REFERRING MD:  EDP  CHIEF COMPLAINT:  Difficulty breathing  BRIEF Ms. Ring is 19F with history of end stage COPD (FEV1 19% predicted; 0.48L in 2014) on 3.5L home O2 followed by Dr. Maple Hudson, who presented 3/19 with shortness of breath.  Had progressively worsening respiratory status and hypercarbia despite BD, steroids and ultimately required intubation in ER.    During her last admission she was seen by palliative care and started on morphine to relieve her air hunger. She was discharged on 50mg  prednisone (stop date 3/20). Her family states that they have previously discussed goals of care and that she would not want to be on mechanical ventilation for a prolonged period. She would not want tracheostomy.    STUDIES:  2D echo 3/20>>> EF 60-65%,  PASP 4/20, grade 1 diastolic dysfunction   CULTURES: Blood 3/20>> ng Urine 3/20 - no growth RVP 3/20 - nasal - Flu A + Trach aspirate bact 3/20 -   ANTIBIOTICS: Vanc 3/19>>> 3/22 Aztreonam 3/19>>>3/22 Tamiflu 3/22 >> (5 days)  LINES/TUBES: ETT 3/19>>> 3/22 L IJ CVL 3/19>>>  EVENTS 12/05/15 - Worsening hypotension overnight.  CVL placed, pressors started.  12/07/15 - went apneic on sbt per RRT but that was on dioprivan.  Flu A positive on PCR. Needed restart home xanax last night. Extubated 3/23 palliative care consut   SUBJECTIVE:   Congested rattly cough but no sob at rest/ mucus mosly white/ new cc nasal 0bst with dry 02  2lpm     VITAL SIGNS: BP 152/74 mmHg  Pulse 67  Temp(Src) 98.3 F (36.8 C) (Oral)  Resp 18  Ht 5\' 1"  (1.549 m)  Wt 145 lb 8.1 oz (66 kg)  BMI 27.51 kg/m2  SpO2 98%  INTAKE / OUTPUT: I/O last 3 completed shifts: In: 240 [P.O.:240] Out: 1425 [Urine:1425]  PHYSICAL EXAMINATION: Physical Exam: Temp:  [97.6 F (36.4 C)-98.3 F (36.8 C)] 98.3 F  (36.8 C) (04/02 0703) Pulse Rate:  [67-84] 67 (04/02 0703) Resp:  [15-18] 18 (04/02 0703) BP: (152-163)/(74-78) 152/74 mmHg (04/02 0703) SpO2:  [93 %-100 %] 98 % (04/02 0732) Weight:  [145 lb 8.1 oz (66 kg)] 145 lb 8.1 oz (66 kg) (04/02 0703)   General: General:  Awake.  Alert. Integument:  Warm & dry. No rashes on exposed skin. HEENT:  Moist mucus membranes.  No oral ulcers. Pulmonary:  Decreased BS bilaterally stable. Normal work of breathing on [place] oxygen. Abdomen: Soft. Normal bowel sounds. Nontender. Neurological: Alert and oriented 3. Grossly nonfocal. Moving all 4 extremities.  LABS:  PULMONARY No results for input(s): PHART, PCO2ART, PO2ART, HCO3, TCO2, O2SAT in the last 168 hours.  Invalid input(s): PCO2, PO2  CBC No results for input(s): HGB, HCT, WBC, PLT in the last 168 hours.  COAGULATION No results for input(s): INR in the last 168 hours.  CARDIAC   No results for input(s): TROPONINI in the last 168 hours. No results for input(s): PROBNP in the last 168 hours.   CHEMISTRY  Recent Labs Lab 12/12/15 0635 12/13/15 0419 12/14/15 0510  NA 136 137 136  K 3.8 3.3* 3.6  CL 87* 85* 86*  CO2 43* 46* 42*  GLUCOSE 88 84 82  BUN 16 11 8   CREATININE 0.54 0.59 0.55  CALCIUM 8.5* 8.8* 9.1  MG 2.1  --  1.7  PHOS 3.3  --  3.7   Estimated Creatinine Clearance: 67.5 mL/min (by C-G formula based on Cr of 0.55).   LIVER  Recent Labs Lab 12/14/15 0510  ALBUMIN 3.4*     INFECTIOUS No results for input(s): LATICACIDVEN, PROCALCITON in the last 168 hours.   ENDOCRINE CBG (last 3)   Recent Labs  12/17/15 1703 12/17/15 2114 12/18/15 0758  GLUCAP 191* 145* 85    IMAGING x48h  - image(s) personally visualized  -   highlighted in bold No results found.  ASSESSMENT / PLAN: [name] is a 31F w/ end stage COPD (FEV1 19% predicted in 2014) admitted with acute on chronic hypercapneic/hypoxemic  respiratory failure requiring intubation. Status post  extubation. Clinically improving with current nebulizer & steroid regimen. Awaiting home BiPAP. Oxygen requirement relatively stable off Lasix. Patient is ambulating & mobile.   1. Acute on Chronic Hypercarbic Respiratory Failure: Continuing treatment of COPD exacerbation. Continuing nocturnal BiPAP> setting up for home BIPAP 2. Acute on Chronic Hypoxic Respiratory Failure: Improving. Likely secondary to influenza pneumonia. Weaning FiO2 as tolerated/ needs humdity added to nasal 02  3. COPD Exacerbation: Continuing treatment with Xopenex & Atrovent nebulized 3 times a day. Continue Prednisone 60mg  daily with a 2 week taper. 4. End-Stage Lung Disease: Morphine PO prn. Xanax bid. Palliative medicine consulted.  5     Depression: Continuing Xanax tid prn & Zoloft daily at bedtime.  5. Diet: Regular diet. 6. Prophylaxis: Protonix by mouth daily & heparin subcutaneous every 8 hours. 7.  . Plan for D/C to home 4/3  if BiPAP arranged w/ home health nurse & close followup.     6/3, MD Pulmonary and Critical Care Medicine [facility] Cell [phone] After 5:30 PM or weekends, call [phone]

## 2015-12-19 LAB — GLUCOSE, CAPILLARY
GLUCOSE-CAPILLARY: 80 mg/dL (ref 65–99)
GLUCOSE-CAPILLARY: 126 mg/dL — AB (ref 65–99)
Glucose-Capillary: 159 mg/dL — ABNORMAL HIGH (ref 65–99)
Glucose-Capillary: 90 mg/dL (ref 65–99)

## 2015-12-19 MED ORDER — HYDROCOD POLST-CPM POLST ER 10-8 MG/5ML PO SUER
5.0000 mL | Freq: Two times a day (BID) | ORAL | Status: DC | PRN
  Administered 2015-12-19 – 2015-12-20 (×2): 5 mL via ORAL
  Filled 2015-12-19 (×2): qty 5

## 2015-12-19 NOTE — Progress Notes (Addendum)
LM with Wendy [name] of [employer] (DME supplier of home resp supplies) to request information for peer to peer  regarding approval of home NIV. 12-19-15 09:20 Peer to Peer number [phone] #[id number] provided to Dr Jamison Neighbor to contest denial.

## 2015-12-19 NOTE — Progress Notes (Signed)
Phone Conversation: I attempted to contact the patient's insurance provider [facility] and spoke with a representative named Gabriel Cirri. (She refused to give me her last name). I was informed the patient has been issued a denial and the case has been closed. I was also informed that a period. Her reevaluation of the case was impossible and we would need to filed appeal. I then requested to be transferred to their denial department and was informed that this does not exist. I explained to the individual that the patient is not safe to be discharged to home given her chronic hypercarbic respiratory failure without BiPAP support and that we have been working towards this since the middle of last week. I also explained that if she wereto be discharged home without BiPAP and she could potentially die  or her clinical condition could worsen.I requested to be transferred to the department that could provide me with the appropriate contact information to submit an appeal and was ultimately transferred back to an automated phone line. I was unsuccessful reaching anyone through the automated phone line. I have made our case manager aware of this and believe this individual may have been being somewhat obstructionary in the patient's care. The authorization code given to me was [id number].  Donna Christen Jamison Neighbor, M.D. [facility] Pulmonary [facility] Critical [facility] Pager:  [phone] After 3pm or if no response, call [phone] 11:07 AM 12/19/2015

## 2015-12-19 NOTE — Progress Notes (Signed)
PULMONARY / CRITICAL CARE MEDICINE   Name: Wendy [name] MRN: [id number] DOB: 1960-11-08    ADMISSION DATE:  12/04/2015 CONSULTATION DATE:  12/04/15  REFERRING MD:  EDP  CHIEF COMPLAINT:  Difficulty breathing  BRIEF Ms. 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 Maple [name], who presented 3/19 with shortness of breath.  Had progressively worsening respiratory status and hypercarbia despite BD, steroids and ultimately required intubation in ER.    During her last admission she was seen by palliative care and started on morphine to relieve her air hunger. She was discharged on 50mg  prednisone (stop date 3/20). Her family states that they have previously discussed goals of care and that she would not want to be on mechanical ventilation for a prolonged period. She would not want tracheostomy.    STUDIES:  2D echo 3/20>>> EF 60-65%,  PASP 4/20, grade 1 diastolic dysfunction   CULTURES: Blood 3/20>> ng Urine 3/20 - no growth RVP 3/20 - nasal - Flu A + Trach aspirate bact 3/20 -   ANTIBIOTICS: Vanc 3/19>>> 3/22 Aztreonam 3/19>>>3/22 Tamiflu 3/22 >> (5 days)  LINES/TUBES: ETT 3/19>>> 3/22 L IJ CVL 3/19>>>  EVENTS 12/05/15 - Worsening hypotension overnight.  CVL placed, pressors started.  12/07/15 - went apneic on sbt per RRT but that was on dioprivan.  Flu A positive on PCR. Needed restart home xanax last night. Extubated 3/23 palliative care consut   SUBJECTIVE:   Waiting on BiPAP approval still.  Otherwise ready for discharge home.  Has cough productive of thick mucus.  Asking for tussionex as this has helped in the past.    VITAL SIGNS: BP 151/73 mmHg  Pulse 72  Temp(Src) 98 F (36.7 C) (Oral)  Resp 20  Ht 5\' 1"  (1.549 m)  Wt 63.458 kg (139 lb 14.4 oz)  BMI 26.45 kg/m2  SpO2 97%  INTAKE / OUTPUT: I/O last 3 completed shifts: In: 440 [P.O.:440] Out: 1350 [Urine:1350]  PHYSICAL  EXAMINATION: Physical Exam: Temp:  [97.9 F (36.6 C)-98.1 F (36.7 C)] 98 F (36.7 C) (04/03 0511) Pulse Rate:  [65-80] 72 (04/03 0953) Resp:  [16-20] 20 (04/03 0953) BP: (149-158)/(59-81) 151/73 mmHg (04/03 0511) SpO2:  [94 %-98 %] 97 % (04/03 0953) Weight:  [63.458 kg (139 lb 14.4 oz)] 63.458 kg (139 lb 14.4 oz) (04/03 0357)   General: General:  Awake, visiting with family. Integument:  Warm & dry. No rashes on exposed skin. HEENT:  Moist mucus membranes.  No oral ulcers. Pulmonary:  Decreased BS bilaterally stable. Normal work of breathing on Post oxygen. Abdomen: Soft. Normal bowel sounds. Nontender. Neurological: Alert and oriented 3. Grossly nonfocal. Moving all 4 extremities.  LABS:  PULMONARY  Recent Labs Lab 12/18/15 1955  PHART 7.378  PCO2ART 60.7*  PO2ART 85.2  HCO3 34.9*  TCO2 36.8  O2SAT 95.6    CBC No results for input(s): HGB, HCT, WBC, PLT in the last 168 hours.  COAGULATION No results for input(s): INR in the last 168 hours.  CARDIAC   No results for input(s): TROPONINI in the last 168 hours. No results for input(s): PROBNP in the last 168 hours.   CHEMISTRY  Recent Labs Lab 12/13/15 0419 12/14/15 0510  NA 137 136  K 3.3* 3.6  CL 85* 86*  CO2 46* 42*  GLUCOSE 84 82  BUN 11 8  CREATININE 0.59 0.55  CALCIUM 8.8* 9.1  MG  --  1.7  PHOS  --  3.7   Estimated Creatinine Clearance: 66.3 mL/min (by C-G formula based on Cr of 0.55).   LIVER  Recent Labs Lab 12/14/15 0510  ALBUMIN 3.4*     INFECTIOUS No results for input(s): LATICACIDVEN, PROCALCITON in the last 168 hours.   ENDOCRINE CBG (last 3)   Recent Labs  12/18/15 1200 12/18/15 1707 12/19/15 0826  GLUCAP 111* 173* 80    IMAGING x48h  - image(s) personally visualized  -   highlighted in bold No results found.  ASSESSMENT / PLAN: Ms. Wendy [name] is a 20F w/ end stage COPD (FEV1 19% predicted in 2014) admitted with acute on chronic hypercapneic/hypoxemic  respiratory  failure requiring intubation. Status post extubation. Clinically improving with current nebulizer & steroid regimen.  Oxygen requirement relatively stable off Lasix. Patient is ambulating & mobile.  Awaiting home BiPAP still as of 12/19/15  1. Acute on Chronic Hypercarbic Respiratory Failure: Continuing treatment of COPD exacerbation. Continuing nocturnal BiPAP> setting up for home BIPAP, currently awaiting approval (has been denied and contest is pending). 2. Acute on Chronic Hypoxic Respiratory Failure: Improving. Likely secondary to influenza pneumonia. Weaning FiO2 as tolerated/ needs humdity added to nasal 02  3. COPD Exacerbation: Continuing treatment with Xopenex & Atrovent nebulized 3 times a day. Continue Prednisone 60mg  daily with a 2 week taper. 4. End-Stage Lung Disease: Morphine PO prn. Xanax bid. Palliative medicine consulted.  5     Depression: Continuing Xanax tid prn & Zoloft daily at bedtime.  6. Cough:  Add tussionex, continue mucinex and flutter valve.  7. Diet: Regular diet. 8. Prophylaxis: Protonix by mouth daily & heparin subcutaneous every 8 hours.  Plan for D/C to home once BiPAP arranged w/ home health nurse & close followup.   , Rutherford Guys [facility] Pager: [phone]  or [phone] 12/19/2015, 10:49 AM   Attending Note:  I have examined patient, reviewed labs, studies and notes. I have discussed the case with 02/18/2016, and I agree with the data and plans as amended above. Working on getting BiPAP for home. Appreciate Case management assistance with this.   Ihor Dow, MD, PhD 12/19/2015, 12:50 PM [place] [facility] [phone] or if no answer [phone]

## 2015-12-19 NOTE — Progress Notes (Signed)
   12/19/15 1551  PT Visit Information  Last PT Received On 12/19/15  Assistance Needed +1  Reason Eval/Treat Not Completed Medical issues which prohibited therapy;Fatigue/lethargy limiting ability to participate (reports respiratory difficulty due to coughing, pt now s/p dose of codine cough syrup.  Pt reports. " I feel a little drunk right now.")  History of Present Illness Patient is a 55 y/o female with hx of severe COPD, alcoholism and depression presents with SOB. Had progressively worsening respiratory status and hypercarbia requiring intubation 3/19-3/22.  Joycelyn Rua, PTA pager [phone]

## 2015-12-20 DIAGNOSIS — Z7189 Other specified counseling: Secondary | ICD-10-CM

## 2015-12-20 LAB — TROPONIN I
Troponin I: <0.03 ng/mL (ref <0.031)
Troponin I: 0.03 ng/mL (ref <0.031)

## 2015-12-20 LAB — GLUCOSE, CAPILLARY
GLUCOSE-CAPILLARY: 89 mg/dL (ref 65–99)
GLUCOSE-CAPILLARY: 156 mg/dL — AB (ref 65–99)
Glucose-Capillary: 114 mg/dL — ABNORMAL HIGH (ref 65–99)
Glucose-Capillary: 111 mg/dL — ABNORMAL HIGH (ref 65–99)

## 2015-12-20 MED ORDER — WHITE PETROLATUM GEL
Status: AC
  Administered 2015-12-20: 0.2
  Filled 2015-12-20: qty 1

## 2015-12-20 MED ORDER — METOPROLOL TARTRATE 25 MG PO TABS
25.0000 mg | ORAL_TABLET | Freq: Two times a day (BID) | ORAL | Status: DC
  Administered 2015-12-21 (×2): 25 mg via ORAL
  Filled 2015-12-20 (×2): qty 1

## 2015-12-20 NOTE — Progress Notes (Signed)
Per Dr. Jamison Neighbor, Non Invasive Vent not approved through peer to peer after denial issued from [facility]. Will need home BiPap order today and Bipap delivered to room prior o discharge. Ethelle Lyon with [facility] health eq[facility] company was notified this am of request for Bipap at 7:12 am. CM notified Jermain with [facility] of BiPap need this morning at 9:30 and plan is if there is no response by Selena Batten by 10:30, [facility] will supply BiPap for home use.

## 2015-12-20 NOTE — Progress Notes (Signed)
Nutrition Follow-up  DOCUMENTATION CODES:   Not applicable  INTERVENTION:   -Encourage adequate PO intake on regular diet  NUTRITION DIAGNOSIS:   Inadequate oral intake related to  (SOB) as evidenced by per patient/family report.  Resolved  GOAL:   Patient will meet greater than or equal to 90% of their needs  Met  MONITOR:   PO intake, Supplement acceptance, I & O's  REASON FOR ASSESSMENT:   Consult Enteral/tube feeding initiation and management  ASSESSMENT:   55 year old female w/ end stage COPD admitted with acute on chronic hypercapneic respiratory failure requiring intubation. Other medical history includes diastolic heart failure, obstructive sleep apnea and rheumatoid arthritis on Humira and prednisone.  Pt sitting in bed at time of visit, consuming red velvet cake brought in by family members. Pt reports appetite continues to improve. Meal completion 75-100%. Pt denies any further nutritional needs or questions, but expressed appreciation for visit.   Per MD notes, pt is stable for discharge and awaiting approval for home Bi-pap.   Labs reviewed.   Diet Order:  Diet regular Room service appropriate?: Yes; Fluid consistency:: Thin Diet - low sodium heart healthy  Skin:  Reviewed, no issues  Last BM:  12/19/15  Height:   Ht Readings from Last 1 Encounters:  12/04/15 _0  (1.549 m)    Weight:   Wt Readings from Last 1 Encounters:  12/20/15 139 lb 15.9 oz (63.5 kg)    Ideal Body Weight:  47.7 kg  BMI:  Body mass index is 26.46 kg/(m^2).  Estimated Nutritional Needs:   Kcal:  1600-1800  Protein:  75-90 grams  Fluid:  > 1.6 L/day  EDUCATION NEEDS:   No education needs identified at this time  Abbigal Radich A. Jimmye Norman, RD, LDN, CDE Pager: [phone] After hours Pager: [phone]

## 2015-12-20 NOTE — Progress Notes (Signed)
PULMONARY / CRITICAL CARE MEDICINE   Name: Wendy [name] MRN: [id number] DOB: [birth year]    ADMISSION DATE:  12/04/2015 CONSULTATION DATE:  12/04/15  REFERRING MD:  EDP  CHIEF COMPLAINT:  Difficulty breathing  BRIEF Ms. 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 Maple Hudson, who presented 3/19 with shortness of breath.  Had progressively worsening respiratory status and hypercarbia despite BD, steroids and ultimately required intubation in ER.    During her last admission she was seen by palliative care and started on morphine to relieve her air hunger. She was discharged on 50mg  prednisone (stop date 3/20). Her family states that they have previously discussed goals of care and that she would not want to be on mechanical ventilation for a prolonged period. She would not want tracheostomy.    STUDIES:  2D echo 3/20>>> EF 60-65%,  PASP 4/20, grade 1 diastolic dysfunction   CULTURES: Blood 3/20>> ng Urine 3/20 - no growth RVP 3/20 - nasal - Flu A + Trach aspirate bact 3/20 -   ANTIBIOTICS: Vanc 3/19>>> 3/22 Aztreonam 3/19>>>3/22 Tamiflu 3/22 >> (5 days)  LINES/TUBES: ETT 3/19>>> 3/22 L IJ CVL 3/19>>>  EVENTS 12/05/15 - Worsening hypotension overnight.  CVL placed, pressors started.  12/07/15 - went apneic on sbt per RRT but that was on dioprivan.  Flu A positive on PCR. Needed restart home xanax last night. Extubated 3/23 palliative care consut   SUBJECTIVE:   Patient had some chest chest pain and tingling of both arm.  Patient also states that she  was hypertensive and got some medication which dropped her BP too quickly.  She states that she received some Xanax this morning which is making her sleepy.  She denies any chest pain, nausea, diaphoresis at this time VITAL SIGNS: BP 112/96 mmHg  Pulse 81  Temp(Src) 97.4 F (36.3 C) (Oral)  Resp 18  Ht 5\' 1"  (1.549 m)  Wt 139 lb 15.9 oz (63.5 kg)  BMI 26.46  kg/m2  SpO2 97%  INTAKE / OUTPUT: I/O last 3 completed shifts: In: 220 [P.O.:220] Out: 1650 [Urine:1650]  PHYSICAL EXAMINATION: Physical Exam: Temp:  [97.4 F (36.3 C)-98.7 F (37.1 C)] 97.4 F (36.3 C) (04/04 0746) Pulse Rate:  [68-92] 81 (04/04 0746) Resp:  [16-20] 18 (04/04 0523) BP: (112-181)/(56-96) 112/96 mmHg (04/04 0941) SpO2:  [95 %-99 %] 97 % (04/04 0818) Weight:  [139 lb 15.9 oz (63.5 kg)] 139 lb 15.9 oz (63.5 kg) (04/04 11-08-1981)   General: General:  Was found resting comfortably Integument:  Warm & dry. No rashes on exposed skin. HEENT:  Moist mucus membranes.  No oral ulcers. Pulmonary:  Decreased BS bilaterally stable. Normal work of breathing on Naplate oxygen. Abdomen: Soft. Normal bowel sounds. Nontender. Neurological: Alert and oriented 3. Grossly nonfocal. Moving all 4 extremities.  LABS:  PULMONARY  Recent Labs Lab 12/18/15 1955  PHART 7.378  PCO2ART 60.7*  PO2ART 85.2  HCO3 34.9*  TCO2 36.8  O2SAT 95.6    CBC No results for input(s): HGB, HCT, WBC, PLT in the last 168 hours.  COAGULATION No results for input(s): INR in the last 168 hours.  CARDIAC   No results for input(s): TROPONINI in the last 168 hours. No results for input(s): PROBNP in the last 168 hours.   CHEMISTRY  Recent Labs Lab 12/14/15 0510  NA 136  K 3.6  CL 86*  CO2 42*  GLUCOSE 82  BUN  8  CREATININE 0.55  CALCIUM 9.1  MG 1.7  PHOS 3.7   Estimated Creatinine Clearance: 66.3 mL/min (by C-G formula based on Cr of 0.55).   LIVER  Recent Labs Lab 12/14/15 0510  ALBUMIN 3.4*     INFECTIOUS No results for input(s): LATICACIDVEN, PROCALCITON in the last 168 hours.   ENDOCRINE CBG (last 3)   Recent Labs  12/19/15 1653 12/19/15 2106 12/20/15 0805  GLUCAP 159* 90 89    IMAGING x48h  - image(s) personally visualized  -   highlighted in bold No results found.  ASSESSMENT / PLAN: Ms. Saylor is a 77F w/ end stage COPD (FEV1 19% predicted in 2014)  admitted with acute on chronic hypercapneic/hypoxemic  respiratory failure requiring intubation. Status post extubation. Clinically improving with current nebulizer & steroid regimen.  Oxygen requirement relatively stable off Lasix. Patient is awaiting home BiPAP.  Patient started complaining of chest pain this morning on 4/4. Awaiting results of cardiac markers.  EKG was normal 1. Acute on Chronic Hypercarbic Respiratory Failure: Continuing treatment of COPD exacerbation. Continuing nocturnal BiPAP> setting up for home BIPAP, currently awaiting approval (has been denied and contest is pending). 2. Acute on Chronic Hypoxic Respiratory Failure: Improving. Likely secondary to influenza pneumonia. Weaning FiO2 as tolerated/ needs humdity added to nasal 02  3. COPD Exacerbation: Continuing treatment with Xopenex & Atrovent nebulized 3 times a day. Continue Prednisone 60mg  daily with a 2 week taper. 4. End-Stage Lung Disease: Morphine PO prn. Xanax bid. Palliative medicine consulted.  5     Depression: Continuing Xanax tid prn & Zoloft daily at bedtime.  6. Cough:  Add tussionex, continue mucinex and flutter valve. 7. Diet: Regular diet. 8. Prophylaxis: Protonix by mouth daily & heparin subcutaneous every 8 hours.  9   Chest pain:  EKG was negative for any ST elevation, awaiting results from cardiac bio-markers.      Bincy Varughese,AG-ACNP Pulmonary & Critical Care  Attending Note:  55 year old female with ES-COPD presenting with SOB.  She is DNR at this point and without BiPAP overnight has a very difficult time during the day.  She is refusing to go to SNF.  On exam, distant BS.  I reviewed CXR myself, hyperinflation.  BiPAP to be delivered in the hospital then will be able to discharge home.  Discussed with PCCM-NP and bedside RN.  Acute on chronic respiratory failure:  - BiPAP to be delivered then patient can be discharged home.  - F/U as outpatient with Dr. 58.  ES-COPD:  -  Combivent.  - Albuterol.  - PO morphine.  - Xanax.  - Palliative care medicine consult.  COPD exacerbation:  - Combivent.  - Albuterol.  - Prednisone with taper.  Cough:  - Tussionex.  - Mucinex.  Will likely d/c tomorrow.  Patient seen and examined, agree with above note.  I dictated the care and orders written for this patient under my direction.  Maple Hudson, MD [phone]

## 2015-12-20 NOTE — Progress Notes (Signed)
   12/20/15 1538  PT Visit Information  Last PT Received On 12/20/15  Assistance Needed +1  Reason Eval/Treat Not Completed (Pt remains to report that she is not feeling well.  Will cont efforts per POC pending patient participation.  )  History of Present Illness Patient is a 55 y/o female with hx of severe COPD, alcoholism and depression presents with SOB. Had progressively worsening respiratory status and hypercarbia requiring intubation 3/19-3/22.  Joycelyn Rua, PTA pager [phone]

## 2015-12-20 NOTE — Progress Notes (Signed)
   12/20/15 0957  PT Visit Information  Last PT Received On 12/20/15  Assistance Needed +1  Reason Eval/Treat Not Completed Medical issues which prohibited therapy;Fatigue/lethargy limiting ability to participate (Pt reports having chest pain with tingling in B arms post toilet transfer.  Pt reports she wished to rest at this time and PTA reports she will f/u pending symptoms and medical status.  Pt agreeable.  )  Joycelyn Rua, PTA pager [phone]

## 2015-12-20 NOTE — Care Management Important Message (Signed)
Important Message  Patient Details  Name: Wendy [name] MRN: [id number] Date of Birth: [birth year]   Medicare Important Message Given:  Yes    [name] 12/20/2015, 11:07 AM

## 2015-12-20 NOTE — Care Management Note (Addendum)
Case Management Note  Patient Details  Name: Wendy [name] MRN: [id number] Date of Birth: September 05, 1961  Subjective/Objective:                 Patient admitted from home. Sever hypercapnia, wil require BiPap on discharge. Could not obtain approval through [facility] for Trilogy non invasive vent. For home bipap to be approved, patient will require sleep study even though she does not suffer from OSA, per [facility]. As such, order has been faxed to [place] sleep study center, patient is aware of sleep study necessity per [facility]/ and insurance. Patient has oxygen through [facility] and wold like to use [facility] for [facility] needs. Patient w/ c/o chest pain this morning.   Action/Plan:  Patient to have BiPap delivered to room by [facility] today.  Addendum- Patient tolerated home bipap overnight, and DC'd to home with equipment. [facility] RN PT to follow after discharge.  Expected Discharge Date:  12/15/15               Expected Discharge Plan:  Home w Home Health Services  In-House Referral:     Discharge planning Services  CM Consult  Post Acute Care Choice:  Resumption of Svcs/PTA Provider, Home Health Choice offered to:  Patient  DME Arranged:  Bipap (Trilogy Non invasive ventilator NOT approved at this time. Will DC with BiPap) DME Agency:  [facility]  HH Arranged:  RN, PT [facility] Agency:  [facility]  Status of Service:  In process, will continue to follow  Medicare Important Message Given:  Yes Date Medicare IM Given:    Medicare IM give by:    Date Additional Medicare IM Given:    Additional Medicare Important Message give by:     If discussed at Long Length of Stay Meetings, dates discussed:    Additional Comments:  Lawerance Sabal, RN 12/20/2015, 11:54 AM

## 2015-12-20 NOTE — Progress Notes (Signed)
eLink Physician-Brief Progress Note Patient Name: Wendy [name] DOB: [birth year] MRN: [id number]   Date of Service  12/20/2015  HPI/Events of Note  Called by RN. Pt in am took hydralazine and then refused hours later to take more cause she felt "woozy". It is unclear if this was related to hydralazine She is sys 170 now  eICU Interventions  Ad d  hydral to side effect /allergy list Dc hydral Add metop tele     Intervention Category Intermediate Interventions: Communication with other healthcare providers and/or family  Nelda Bucks. 12/20/2015, 11:45 PM

## 2015-12-21 ENCOUNTER — Telehealth: Payer: Self-pay | Admitting: Internal Medicine

## 2015-12-21 LAB — GLUCOSE, CAPILLARY: Glucose-Capillary: 82 mg/dL (ref 65–99)

## 2015-12-21 NOTE — Progress Notes (Signed)
Assisted patient with setup and putting on her home BIPAP machine. Setting from [facility] are 12/8 with 4 L O2 bleed in, sats 94%. Pt tol well. Will continue to monitor.

## 2015-12-21 NOTE — Telephone Encounter (Signed)
Called and spoke with [facility]. She states that the pt stated that she was suppose to be discharged from the hospital on a morphine pill. I explained to her that I do show morphine 7.5mg  Take every 6 hours as needed given by Lorinda Creed, NP on 12/13/15. I explained to [facility] that Lorinda Creed, NP is not with our practice. She voiced understanding and had no further questions. Nothing further needed.

## 2015-12-21 NOTE — Progress Notes (Signed)
   12/21/15 0930  PT Visit Information  Last PT Received On 12/21/15  Reason Eval/Treat Not Completed Other (comment) (Nursing deferral, pt set d/c home, CPAP has been delivered and RN working on pt d/c.  Will defer at this time.  )  History of Present Illness Patient is a 55 y/o female with hx of severe COPD, alcoholism and depression presents with SOB. Had progressively worsening respiratory status and hypercarbia requiring intubation 3/19-3/22.  Joycelyn Rua, PTA pager [phone]

## 2015-12-22 ENCOUNTER — Encounter: Payer: Self-pay | Admitting: Acute Care

## 2015-12-22 ENCOUNTER — Ambulatory Visit (INDEPENDENT_AMBULATORY_CARE_PROVIDER_SITE_OTHER)
Admission: RE | Admit: 2015-12-22 | Discharge: 2015-12-22 | Disposition: A | Discharge status: Home / Self Care | Payer: Medicare Other | Source: Ambulatory Visit | Attending: Acute Care | Admitting: Acute Care

## 2015-12-22 ENCOUNTER — Ambulatory Visit (INDEPENDENT_AMBULATORY_CARE_PROVIDER_SITE_OTHER): Payer: Medicare Other | Admitting: Acute Care

## 2015-12-22 ENCOUNTER — Telehealth: Payer: Self-pay | Admitting: Acute Care

## 2015-12-22 VITALS — BP 138/80 | HR 90 | Wt 144.0 lb

## 2015-12-22 DIAGNOSIS — Z9981 Dependence on supplemental oxygen: Secondary | ICD-10-CM | POA: Diagnosis not present

## 2015-12-22 DIAGNOSIS — R0689 Other abnormalities of breathing: Secondary | ICD-10-CM | POA: Diagnosis not present

## 2015-12-22 DIAGNOSIS — J9602 Acute respiratory failure with hypercapnia: Secondary | ICD-10-CM

## 2015-12-22 DIAGNOSIS — J189 Pneumonia, unspecified organism: Secondary | ICD-10-CM | POA: Diagnosis not present

## 2015-12-22 DIAGNOSIS — J449 Chronic obstructive pulmonary disease, unspecified: Secondary | ICD-10-CM | POA: Diagnosis not present

## 2015-12-22 DIAGNOSIS — J9601 Acute respiratory failure with hypoxia: Secondary | ICD-10-CM

## 2015-12-22 DIAGNOSIS — J441 Chronic obstructive pulmonary disease with (acute) exacerbation: Secondary | ICD-10-CM | POA: Diagnosis not present

## 2015-12-22 DIAGNOSIS — R05 Cough: Secondary | ICD-10-CM | POA: Diagnosis not present

## 2015-12-22 DIAGNOSIS — I509 Heart failure, unspecified: Secondary | ICD-10-CM | POA: Diagnosis not present

## 2015-12-22 MED ORDER — ACETAMINOPHEN-CODEINE #3 300-30 MG PO TABS: 1.0000 | ORAL_TABLET | Freq: Two times a day (BID) | ORAL | Status: DC | PRN

## 2015-12-22 NOTE — Patient Instructions (Addendum)
It is nice to meet you today.I am glad you are feeling better We will check a CXR today. We will schedule you for a sleep study as soon as possible. Continue Mucinex 2 capsules twice daily. Take with a full glass of water Continue using flutter valve 3-4 times daily We will send a prescription for your albuterol nebulizers. Follow up with Dr. Maple Hudson at his first available appointment. Tylenol #3 for blunting of dyspnea Continue nocturnal BiPAP Please contact office for sooner follow up if symptoms do not improve or worsen or seek emergency care

## 2015-12-22 NOTE — Addendum Note (Signed)
Addended by: Ronny Bacon on: 12/22/2015 04:31 PM   Modules accepted: Orders

## 2015-12-22 NOTE — Telephone Encounter (Signed)
Pt is aware that Rx is at front and states she will pick up tomorrow.

## 2015-12-22 NOTE — Telephone Encounter (Signed)
I received a phone call from the palliative care nurse at [facility]. This patient is requesting morphine 7.5 mg tablets q 6 hours prn dyspnea.I spoke with Dr. Maple Hudson who stated that we do not prescribe morphine for dyspnea without Hospice support. This patient is not a Hospice patient.I have offered, at his suggestion, Tylenol #3, twice daily as needed for dyspnea blunting. Codeine is listed as causing nausea, but the patient stated she would like to have the medication to try if needed. We will call in the RX for the patient. She knows she has to pick it up at the office. She verbalized understanding of the above and had no further questions.

## 2015-12-22 NOTE — Assessment & Plan Note (Signed)
Resolving Plan: CXR today Complete prednisone taper as prescribed We will schedule you for a sleep study as soon as possible. Continue Mucinex 2 capsules twice daily. Take with a full glass of water Continue using flutter valve 3-4 times daily We will send a prescription for your albuterol nebulizers. Follow up with Dr. Maple Hudson at his first available appointment. Tylenol #3 for blunting of dyspnea Continue nocturnal BiPAP Call for any change in your condition so we can be proactive in treatment. Please contact office for sooner follow up if symptoms do not improve or worsen or seek emergency care

## 2015-12-22 NOTE — Progress Notes (Signed)
Subjective:    Patient ID: Wendy [name], female    DOB: 1961-04-12, 55 y.o.   MRN: [id number]  HPI 55 year old female with history of end stage COPD (FEV1 19% predicted; 0.48L in 2014) on 3.5L home O2 followed by Dr. Maple Hudson CHF Grade 1 diastolic dysfunction. Now nocturnal BiPAP dependent .  Studies: 2D echo 3/20>>> EF 60-65%, PASP , grade 1 diastolic dysfunction   CXR 11/23/2015  COMPARISON: 11/22/2015  FINDINGS: Patchy right upper lobe opacity again noted, similar to prior study. No confluent opacity on the left. Heart is borderline in size. No effusions or acute bony abnormality.  IMPRESSION: Patchy right upper lobe opacity again noted, possibly early infiltrate/pneumonia. Recommend follow-up to resolution.  12/22/2015: CXR  FINDINGS: Stable cardiomediastinal silhouette. No pneumothorax or pleural effusion is noted. Both lungs are clear. The visualized skeletal structures are unremarkable.  IMPRESSION: No active cardiopulmonary disease.  12/22/2015: Hospital Follow Up Visit:  Pt. Presents to the office today for hospital follow up.She has had 2 recent hospitalizations , initially for Acute on Chronic Hypoxic Respiratory Failure:Likely secondary to influenza pneumonia.( 11/22/2015-11/30/2015), and more recently for Acute on Chronic Hypercarbic  Respiratory Failure/ COPD exacerbation ( 12/04/2015- 12/21/18-17). She was treated with broad spectrum antibiotics , steroids, and BD.She states she remains weak and deconditioned, but her breathing is better on her baseline oxygen, and her secretions are clearing up.She was seen by palliative care upon both of these recent admissions, but was intubated 12/04/2015. She was treated with morphine while an inpatient for blunting of dyspnea.She continues her prednisone taper, is coughing up whitish thinner secretions, and denies fever or chest pain, orthopnea,hemoptysis, or purulent secretions. CXR today indicates no acute cardiopulmonary  disease. She is most concerned today with getting scheduled for a sleep study so her insurance will pay for her BiPAP machine. We will work hard to get her scheduled for approval of the machine. We did discuss that without Hospice support we will not continue morphine at home. Per Dr. Maple Hudson we will prescribe limited dosing of Tylenol #3 in its place for use no more than twice daily.   Current outpatient prescriptions:  .  Adalimumab (HUMIRA PEN-CROHNS STARTER) 40 MG/0.8ML PNKT, Inject 40 mg into the skin every 14 (fourteen) days., Disp: 2 each, Rfl:  .  ADVAIR DISKUS 500-50 MCG/DOSE AEPB, INHALE 1 PUFF INTO THE LUNGS 2 TIMES DAILY, Disp: 60 each, Rfl: 3 .  albuterol (PROAIR HFA) 108 (90 BASE) MCG/ACT inhaler, 2 puffs every 4 hours as needed- rescue (Patient taking differently: Inhale 2 puffs into the lungs every 4 (four) hours as needed for wheezing. 2 puffs every 4 hours as needed- rescue), Disp: 2 Inhaler, Rfl: prn .  albuterol (PROVENTIL) (2.5 MG/3ML) 0.083% nebulizer solution, Take 2.5 mg by nebulization every 6 (six) hours as needed for wheezing or shortness of breath., Disp: , Rfl:  .  ALPRAZolam (XANAX) 1 MG tablet, 1 tab, three times daily as needed (Patient taking differently: Take 1 mg by mouth 3 (three) times daily as needed for anxiety. 1 tab, three times daily as needed), Disp: 90 tablet, Rfl: 5 .  chlorpheniramine-HYDROcodone (TUSSIONEX) 10-8 MG/5ML SUER, Take 5 mLs by mouth every 12 (twelve) hours., Disp: 140 mL, Rfl: 0 .  fluticasone (FLONASE) 50 MCG/ACT nasal spray, PLACE 2 SPRAYS INTO BOTH NOSTRILS DAILY., Disp: 16 g, Rfl: 2 .  pantoprazole (PROTONIX) 20 MG tablet, TAKE 1 TABLET (20 MG TOTAL) BY MOUTH DAILY BEFORE SUPPER., Disp: 30 tablet, Rfl: 5 .  predniSONE (DELTASONE) 10 MG tablet, Take 6 tabs PO daily x3 days,  Then Take 5 tabs PO daily x3days then 40 mg PO x3days, then 30mg  POx 3 days, then 20mg  x three days,then return to normal dose 10mg  daily. #60, Disp: 60 tablet, Rfl: 0 .   SPIRIVA HANDIHALER 18 MCG inhalation capsule, PLACE 1 CAPSULE (18 MCG TOTAL) INTO INHALER AND INHALE DAILY., Disp: 30 capsule, Rfl: 4 .  theophylline (UNIPHYL) 400 MG 24 hr tablet, Take 0.5 tablets (200 mg total) by mouth 2 (two) times daily with a meal., Disp: 30 tablet, Rfl: 11 .  traZODone (DESYREL) 50 MG tablet, Take 1 tablet (50 mg total) by mouth at bedtime., Disp: 90 tablet, Rfl: 3 .  acetaminophen-codeine (TYLENOL #3) 300-30 MG tablet, Take 1 tablet by mouth 2 (two) times daily as needed (Dyspnea)., Disp: 60 tablet, Rfl: 0   Past Medical History  Diagnosis Date  . Asthma   . Seasonal allergies   . Poor dentition   . Alcoholism (HCC)   . COPD (chronic obstructive pulmonary disease) (HCC)   . Depression   . Oxygen dependent     home oxygen 3L/min  . Urinary, incontinence, stress female   . Psoriasis     Allergies  Allergen Reactions  . Augmentin [Amoxicillin-Pot Clavulanate] Nausea Only  . Cefdinir Other (See Comments)    Aches, "heart fluttering"  . Other Nausea Only    PAIN MEDICATIONS-nausea  . Codeine Nausea Only  . Fexofenadine Nausea Only  . Hydralazine     Side effect Felt woozy     Review of Systems Constitutional:   No  weight loss, night sweats,  Fevers, chills, fatigue, or  lassitude.  HEENT:   No headaches,  Difficulty swallowing,  Tooth/dental problems, or  Sore throat,                No sneezing, itching, ear ache, nasal congestion, post nasal drip,   CV:  No chest pain,  Orthopnea, PND, swelling in lower extremities, anasarca, dizziness, palpitations, syncope.   GI  + heartburn, no indigestion, abdominal pain, nausea, vomiting, diarrhea, change in bowel habits, loss of appetite, bloody stools.   Resp: + persistent shortness of breath with exertion or at rest.  Lessening excess mucus, + productive cough,  No non-productive cough,  No coughing up of blood.  No change in color of mucus.  No wheezing.  No chest wall deformity  Skin: psoriasis rash  GU:  no dysuria, change in color of urine, no urgency or frequency.  No flank pain, no hematuria   MS:  No joint pain or swelling.  No decreased range of motion.  No back pain.  Psych:  No change in mood or affect. No depression or anxiety.  No memory loss.        Objective:   Physical Exam  BP 138/80 mmHg  Pulse 90  Wt 144 lb (65.318 kg)  SpO2 95% Physical Exam:  General- No distress,  A&Ox3 ENT: No sinus tenderness, TM clear, pale nasal mucosa, no oral exudate,no post nasal drip, no LAN Cardiac: S1, S2, regular rate and rhythm, no murmur Chest: No wheeze/ rales/ dullness; no accessory muscle use, no nasal flaring, no sternal retractions, distant upon auscultation Abd.: Soft Non-tender Ext: No clubbing cyanosis, edema Neuro:  normal strength Skin: No rashes, warm and dry Psych: normal mood and behavior  , AGACNP-BC [facility] Pulmonary/Critical Care Medicine Pager # [phone] 12/22/2015    Assessment & Plan:

## 2015-12-24 DIAGNOSIS — J189 Pneumonia, unspecified organism: Secondary | ICD-10-CM | POA: Diagnosis not present

## 2015-12-24 DIAGNOSIS — J441 Chronic obstructive pulmonary disease with (acute) exacerbation: Secondary | ICD-10-CM | POA: Diagnosis not present

## 2015-12-24 DIAGNOSIS — I509 Heart failure, unspecified: Secondary | ICD-10-CM | POA: Diagnosis not present

## 2015-12-24 DIAGNOSIS — Z9981 Dependence on supplemental oxygen: Secondary | ICD-10-CM | POA: Diagnosis not present

## 2015-12-25 DIAGNOSIS — J441 Chronic obstructive pulmonary disease with (acute) exacerbation: Secondary | ICD-10-CM | POA: Diagnosis not present

## 2015-12-25 DIAGNOSIS — Z9981 Dependence on supplemental oxygen: Secondary | ICD-10-CM | POA: Diagnosis not present

## 2015-12-25 DIAGNOSIS — J189 Pneumonia, unspecified organism: Secondary | ICD-10-CM | POA: Diagnosis not present

## 2015-12-25 DIAGNOSIS — I509 Heart failure, unspecified: Secondary | ICD-10-CM | POA: Diagnosis not present

## 2015-12-26 ENCOUNTER — Other Ambulatory Visit: Payer: Self-pay | Admitting: Internal Medicine

## 2015-12-26 ENCOUNTER — Telehealth: Payer: Self-pay | Admitting: Internal Medicine

## 2015-12-26 DIAGNOSIS — J449 Chronic obstructive pulmonary disease, unspecified: Secondary | ICD-10-CM | POA: Diagnosis not present

## 2015-12-26 DIAGNOSIS — J441 Chronic obstructive pulmonary disease with (acute) exacerbation: Secondary | ICD-10-CM | POA: Diagnosis not present

## 2015-12-26 DIAGNOSIS — J189 Pneumonia, unspecified organism: Secondary | ICD-10-CM | POA: Diagnosis not present

## 2015-12-26 DIAGNOSIS — M6281 Muscle weakness (generalized): Secondary | ICD-10-CM | POA: Diagnosis not present

## 2015-12-26 DIAGNOSIS — Z9981 Dependence on supplemental oxygen: Secondary | ICD-10-CM | POA: Diagnosis not present

## 2015-12-26 DIAGNOSIS — I509 Heart failure, unspecified: Secondary | ICD-10-CM | POA: Diagnosis not present

## 2015-12-26 MED ORDER — ALBUTEROL SULFATE (2.5 MG/3ML) 0.083% IN NEBU: 2.5000 mg | INHALATION_SOLUTION | Freq: Four times a day (QID) | RESPIRATORY_TRACT | Status: DC | PRN

## 2015-12-26 NOTE — Telephone Encounter (Signed)
Spoke with French Ana at [facility]. When pt was here last week, SG started her on albuterol nebs. The med was not sent in. This has been taken care of. Nothing further was needed.

## 2015-12-27 ENCOUNTER — Telehealth: Payer: Self-pay | Admitting: Internal Medicine

## 2015-12-27 DIAGNOSIS — J441 Chronic obstructive pulmonary disease with (acute) exacerbation: Secondary | ICD-10-CM | POA: Diagnosis not present

## 2015-12-27 DIAGNOSIS — J189 Pneumonia, unspecified organism: Secondary | ICD-10-CM | POA: Diagnosis not present

## 2015-12-27 DIAGNOSIS — Z9981 Dependence on supplemental oxygen: Secondary | ICD-10-CM | POA: Diagnosis not present

## 2015-12-27 DIAGNOSIS — I509 Heart failure, unspecified: Secondary | ICD-10-CM | POA: Diagnosis not present

## 2015-12-27 MED ORDER — AZITHROMYCIN 250 MG PO TABS: 250.0000 mg | ORAL_TABLET | ORAL | Status: DC

## 2015-12-27 NOTE — Telephone Encounter (Signed)
Spoke with Wendy [name] >> physical therapist with [facility]. Pt started physical therapy today and complained of increased coughing. Cough is producing yellow mucus, this is thick at times. Running a low grade temp of 99.7. Woke up this AM with slight SOB. Denied chest tightness or wheezing at this time. Pt would like CY's recommendations. CY - please advise. Thanks.  Allergies  Allergen Reactions  . Augmentin [Amoxicillin-Pot Clavulanate] Nausea Only  . Cefdinir Other (See Comments)    Aches, "heart fluttering"  . Other Nausea Only    PAIN MEDICATIONS-nausea  . Codeine Nausea Only  . Fexofenadine Nausea Only  . Hydralazine     Side effect Felt woozy   Current Outpatient Prescriptions on File Prior to Visit  Medication Sig Dispense Refill  . acetaminophen-codeine (TYLENOL #3) 300-30 MG tablet Take 1 tablet by mouth 2 (two) times daily as needed (Dyspnea). 60 tablet 0  . Adalimumab (HUMIRA PEN-CROHNS STARTER) 40 MG/0.8ML PNKT Inject 40 mg into the skin every 14 (fourteen) days. 2 each   . ADVAIR DISKUS 500-50 MCG/DOSE AEPB INHALE 1 PUFF INTO THE LUNGS 2 TIMES DAILY 60 each 3  . albuterol (PROAIR HFA) 108 (90 BASE) MCG/ACT inhaler 2 puffs every 4 hours as needed- rescue (Patient taking differently: Inhale 2 puffs into the lungs every 4 (four) hours as needed for wheezing. 2 puffs every 4 hours as needed- rescue) 2 Inhaler prn  . albuterol (PROVENTIL) (2.5 MG/3ML) 0.083% nebulizer solution Take 3 mLs (2.5 mg total) by nebulization every 6 (six) hours as needed for wheezing or shortness of breath. 150 mL 5  . ALPRAZolam (XANAX) 1 MG tablet 1 tab, three times daily as needed (Patient taking differently: Take 1 mg by mouth 3 (three) times daily as needed for anxiety. 1 tab, three times daily as needed) 90 tablet 5  . chlorpheniramine-HYDROcodone (TUSSIONEX) 10-8 MG/5ML SUER Take 5 mLs by mouth every 12 (twelve) hours. 140 mL 0  . fluticasone (FLONASE) 50 MCG/ACT nasal spray PLACE 2 SPRAYS INTO BOTH  NOSTRILS DAILY. 16 g 2  . pantoprazole (PROTONIX) 20 MG tablet TAKE 1 TABLET (20 MG TOTAL) BY MOUTH DAILY BEFORE SUPPER. 30 tablet 5  . predniSONE (DELTASONE) 10 MG tablet Take 6 tabs PO daily x3 days,  Then Take 5 tabs PO daily x3days then 40 mg PO x3days, then 30mg  POx 3 days, then 20mg  x three days,then return to normal dose 10mg  daily. #60 60 tablet 0  . SPIRIVA HANDIHALER 18 MCG inhalation capsule PLACE 1 CAPSULE (18 MCG TOTAL) INTO INHALER AND INHALE DAILY. 30 capsule 4  . theophylline (UNIPHYL) 400 MG 24 hr tablet Take 0.5 tablets (200 mg total) by mouth 2 (two) times daily with a meal. 30 tablet 11  . traZODone (DESYREL) 50 MG tablet Take 1 tablet (50 mg total) by mouth at bedtime. 90 tablet 3   No current facility-administered medications on file prior to visit.

## 2015-12-27 NOTE — Telephone Encounter (Signed)
Spoke with Wendy [name], aware that Zpak has been sent to [facility] States that the patient is currently on Prednisone Taper and has started this either yesterday or today  Prednisone 10 MG tablet : Take 6 tabs PO daily x3 days, Then Take 5 tabs PO daily x3days then 40 mg PO x3days, then 30mg  POx 3 days, then 20mg  x three days,then return to normal dose 10mg  daily. #60 Will send to Dr just as . Nothing further needed.

## 2015-12-27 NOTE — Telephone Encounter (Signed)
Offer Z pak  If not on prednisone now, suggest prednisone 20 mg, #7, 1 daily

## 2015-12-28 ENCOUNTER — Telehealth: Payer: Self-pay | Admitting: Internal Medicine

## 2015-12-28 DIAGNOSIS — Z9981 Dependence on supplemental oxygen: Secondary | ICD-10-CM | POA: Diagnosis not present

## 2015-12-28 DIAGNOSIS — J189 Pneumonia, unspecified organism: Secondary | ICD-10-CM | POA: Diagnosis not present

## 2015-12-28 DIAGNOSIS — R0602 Shortness of breath: Secondary | ICD-10-CM | POA: Diagnosis not present

## 2015-12-28 DIAGNOSIS — J441 Chronic obstructive pulmonary disease with (acute) exacerbation: Secondary | ICD-10-CM | POA: Diagnosis not present

## 2015-12-28 DIAGNOSIS — I509 Heart failure, unspecified: Secondary | ICD-10-CM | POA: Diagnosis not present

## 2015-12-28 NOTE — Telephone Encounter (Signed)
LMTCB

## 2015-12-29 ENCOUNTER — Telehealth: Payer: Self-pay | Admitting: Family Medicine

## 2015-12-29 DIAGNOSIS — I509 Heart failure, unspecified: Secondary | ICD-10-CM | POA: Diagnosis not present

## 2015-12-29 DIAGNOSIS — J441 Chronic obstructive pulmonary disease with (acute) exacerbation: Secondary | ICD-10-CM | POA: Diagnosis not present

## 2015-12-29 DIAGNOSIS — J189 Pneumonia, unspecified organism: Secondary | ICD-10-CM | POA: Diagnosis not present

## 2015-12-29 DIAGNOSIS — Z9981 Dependence on supplemental oxygen: Secondary | ICD-10-CM | POA: Diagnosis not present

## 2015-12-30 DIAGNOSIS — J189 Pneumonia, unspecified organism: Secondary | ICD-10-CM | POA: Diagnosis not present

## 2015-12-30 DIAGNOSIS — J441 Chronic obstructive pulmonary disease with (acute) exacerbation: Secondary | ICD-10-CM | POA: Diagnosis not present

## 2015-12-30 DIAGNOSIS — I509 Heart failure, unspecified: Secondary | ICD-10-CM | POA: Diagnosis not present

## 2015-12-30 DIAGNOSIS — Z9981 Dependence on supplemental oxygen: Secondary | ICD-10-CM | POA: Diagnosis not present

## 2016-01-02 DIAGNOSIS — I509 Heart failure, unspecified: Secondary | ICD-10-CM | POA: Diagnosis not present

## 2016-01-02 DIAGNOSIS — J189 Pneumonia, unspecified organism: Secondary | ICD-10-CM | POA: Diagnosis not present

## 2016-01-02 DIAGNOSIS — Z9981 Dependence on supplemental oxygen: Secondary | ICD-10-CM | POA: Diagnosis not present

## 2016-01-02 DIAGNOSIS — J441 Chronic obstructive pulmonary disease with (acute) exacerbation: Secondary | ICD-10-CM | POA: Diagnosis not present

## 2016-01-02 NOTE — Telephone Encounter (Signed)
LM for Wendy [name] x 2

## 2016-01-03 ENCOUNTER — Telehealth: Payer: Self-pay | Admitting: Internal Medicine

## 2016-01-03 DIAGNOSIS — J441 Chronic obstructive pulmonary disease with (acute) exacerbation: Secondary | ICD-10-CM | POA: Diagnosis not present

## 2016-01-03 DIAGNOSIS — Z9981 Dependence on supplemental oxygen: Secondary | ICD-10-CM | POA: Diagnosis not present

## 2016-01-03 DIAGNOSIS — I509 Heart failure, unspecified: Secondary | ICD-10-CM | POA: Diagnosis not present

## 2016-01-03 DIAGNOSIS — J189 Pneumonia, unspecified organism: Secondary | ICD-10-CM | POA: Diagnosis not present

## 2016-01-03 NOTE — Telephone Encounter (Signed)
lmtcb X3 for [employer]. Will close message per triage protocol.

## 2016-01-03 NOTE — Telephone Encounter (Signed)
Spoke with patient, see TE dated 12/28/15 Nothing further needed.

## 2016-01-03 NOTE — Telephone Encounter (Signed)
Ok to order unattended Home Sleep Test for dx OSA and chronic hypoxic respiratory failure

## 2016-01-03 NOTE — Telephone Encounter (Signed)
Called and spoke with patient, she said that she has already gotten Sleep Study scheduled for 01/21/16.   Order not needed.  Patient said that Dr. Jamison Neighbor had already ordered it while in hospital.  Nothing further needed.

## 2016-01-03 NOTE — Telephone Encounter (Signed)
Patient can only keep the CPAP until May, needs Sleep Study urgently because she needs CPAP.  Patient is not with Hospice yet, just palliative care.  Synetta Fail, Palliative care nurse, states that it would be detriment to patient's health if she looses her CPAP machine in May.  But, [facility] will not allow her to keep machine if she does not have Sleep Study.  Synetta Fail requesting HST.  But okay with Lab if HST is not covered through insurance.    Dr. Maple Hudson, please advise.

## 2016-01-04 ENCOUNTER — Other Ambulatory Visit: Payer: Self-pay | Admitting: Internal Medicine

## 2016-01-04 DIAGNOSIS — Z9981 Dependence on supplemental oxygen: Secondary | ICD-10-CM | POA: Diagnosis not present

## 2016-01-04 DIAGNOSIS — J189 Pneumonia, unspecified organism: Secondary | ICD-10-CM | POA: Diagnosis not present

## 2016-01-04 DIAGNOSIS — I509 Heart failure, unspecified: Secondary | ICD-10-CM | POA: Diagnosis not present

## 2016-01-04 DIAGNOSIS — J441 Chronic obstructive pulmonary disease with (acute) exacerbation: Secondary | ICD-10-CM | POA: Diagnosis not present

## 2016-01-05 DIAGNOSIS — Z9981 Dependence on supplemental oxygen: Secondary | ICD-10-CM | POA: Diagnosis not present

## 2016-01-05 DIAGNOSIS — I509 Heart failure, unspecified: Secondary | ICD-10-CM | POA: Diagnosis not present

## 2016-01-05 DIAGNOSIS — J189 Pneumonia, unspecified organism: Secondary | ICD-10-CM | POA: Diagnosis not present

## 2016-01-05 DIAGNOSIS — J441 Chronic obstructive pulmonary disease with (acute) exacerbation: Secondary | ICD-10-CM | POA: Diagnosis not present

## 2016-01-06 DIAGNOSIS — I509 Heart failure, unspecified: Secondary | ICD-10-CM | POA: Diagnosis not present

## 2016-01-06 DIAGNOSIS — J441 Chronic obstructive pulmonary disease with (acute) exacerbation: Secondary | ICD-10-CM | POA: Diagnosis not present

## 2016-01-06 DIAGNOSIS — Z9981 Dependence on supplemental oxygen: Secondary | ICD-10-CM | POA: Diagnosis not present

## 2016-01-06 DIAGNOSIS — J189 Pneumonia, unspecified organism: Secondary | ICD-10-CM | POA: Diagnosis not present

## 2016-01-06 DIAGNOSIS — J449 Chronic obstructive pulmonary disease, unspecified: Secondary | ICD-10-CM | POA: Diagnosis not present

## 2016-01-09 DIAGNOSIS — I509 Heart failure, unspecified: Secondary | ICD-10-CM | POA: Diagnosis not present

## 2016-01-09 DIAGNOSIS — J189 Pneumonia, unspecified organism: Secondary | ICD-10-CM | POA: Diagnosis not present

## 2016-01-09 DIAGNOSIS — J441 Chronic obstructive pulmonary disease with (acute) exacerbation: Secondary | ICD-10-CM | POA: Diagnosis not present

## 2016-01-09 DIAGNOSIS — Z9981 Dependence on supplemental oxygen: Secondary | ICD-10-CM | POA: Diagnosis not present

## 2016-01-10 ENCOUNTER — Telehealth: Payer: Self-pay | Admitting: Internal Medicine

## 2016-01-10 ENCOUNTER — Telehealth: Payer: Self-pay

## 2016-01-10 DIAGNOSIS — J441 Chronic obstructive pulmonary disease with (acute) exacerbation: Secondary | ICD-10-CM | POA: Diagnosis not present

## 2016-01-10 DIAGNOSIS — Z9981 Dependence on supplemental oxygen: Secondary | ICD-10-CM | POA: Diagnosis not present

## 2016-01-10 DIAGNOSIS — J189 Pneumonia, unspecified organism: Secondary | ICD-10-CM | POA: Diagnosis not present

## 2016-01-10 DIAGNOSIS — I509 Heart failure, unspecified: Secondary | ICD-10-CM | POA: Diagnosis not present

## 2016-01-10 NOTE — Telephone Encounter (Signed)
Call received from the patient stating that she has an appointment with Dr Armen Pickup on 01/23/16 but would like to schedule an appointment for an earlier date.  She said that she was discharged from the hospital on 12/21/15 and spoke to Dr Armen Pickup and is interested in the Transitional Care [facility] ( TCC) . The patient would not qualify for the TCC as she was discharged > 14 days ago but a hospital follow up appointment was scheduled with Dr Venetia Night for 01/17/16 @ 1030. The patient was very appreciative of the appointment . Instructed her to call the [facility] in the mornings to check for appointment cancellations if she would like to be seen sooner.  Also reviewed with her when to seek immediate medical attention, for increasing shortness of breath, fever, coughing. She said that she his not having any increased difficulty breathing at this time and she is monitoring her temperature as her son is sick.  She stated that she understands when she would need to go the hospital/seek immediate medical attention.  She said that she uses her oxygen continuously and has all of her medications. She also noted that she has a home health nurse and P.T.  from [facility] who are seeing her twice a week.  No other questions reported at this time.

## 2016-01-10 NOTE — Telephone Encounter (Signed)
Spoke with [place] with [facility]. She was calling to let us know that she went to the pt's home today and she was running a low grade fever of 99.1. Nothing is needed at this time, she was just calling to inform us. Pt's son has been sick around the pt. Pt was told to call us if her breathing gets worse or if she develops symptoms. Nothing further was needed at this time.

## 2016-01-11 ENCOUNTER — Telehealth: Payer: Self-pay | Admitting: Internal Medicine

## 2016-01-11 DIAGNOSIS — Z9981 Dependence on supplemental oxygen: Secondary | ICD-10-CM | POA: Diagnosis not present

## 2016-01-11 DIAGNOSIS — I509 Heart failure, unspecified: Secondary | ICD-10-CM | POA: Diagnosis not present

## 2016-01-11 DIAGNOSIS — J189 Pneumonia, unspecified organism: Secondary | ICD-10-CM | POA: Diagnosis not present

## 2016-01-11 DIAGNOSIS — J441 Chronic obstructive pulmonary disease with (acute) exacerbation: Secondary | ICD-10-CM | POA: Diagnosis not present

## 2016-01-11 MED ORDER — DOXYCYCLINE HYCLATE 100 MG PO TABS: 100.0000 mg | ORAL_TABLET | Freq: Two times a day (BID) | ORAL | Status: DC

## 2016-01-11 MED ORDER — PREDNISONE 20 MG PO TABS: 20.0000 mg | ORAL_TABLET | Freq: Every day | ORAL | Status: DC

## 2016-01-11 NOTE — Telephone Encounter (Signed)
Called spoke with [place]. Reviewed CY's recs. Pt voiced understanding and had no further questions. Rx sent. Nothing further needed.

## 2016-01-11 NOTE — Telephone Encounter (Signed)
Spoke with Bethann Berkshire, LPN with [facility].   Reports pt has increased dry cough since Monday.  Has chest congestion but unable to expelle mucus.  Has discomfort in rib cage with cough.  SOB some worse but pt hasn't been doing a lot of activity.  Pt temp yesterday 99.1.  Bethann Berkshire reports it's 98.8 today.  o2 sat 95-98% on 2.5L today with HR 110-120 (reports this is baseline recently), BP 122/90.  Bethann Berkshire reports pt's lungs are normally diminished but has no air movement today -- Left upper and left lower are worse than right.  Concerned bc pt's son has been sickly and in pt's home.  Son was admitted yesterday with pna.  Pt is using albuterol neb and hfa with not much relief. Requesting further recs.  Dr. Maple Hudson, please advise.  Thank you.  Last OV was on 12/22/15 with SG as HFU. Has a pending OV with CY on 03/15/16  Allergies  Allergen Reactions  . Augmentin [Amoxicillin-Pot Clavulanate] Nausea Only  . Cefdinir Other (See Comments)    Aches, "heart fluttering"  . Other Nausea Only    PAIN MEDICATIONS-nausea  . Codeine Nausea Only  . Fexofenadine Nausea Only  . Hydralazine     Side effect Felt woozy     Current Outpatient Prescriptions on File Prior to Visit  Medication Sig Dispense Refill  . acetaminophen-codeine (TYLENOL #3) 300-30 MG tablet Take 1 tablet by mouth 2 (two) times daily as needed (Dyspnea). 60 tablet 0  . Adalimumab (HUMIRA PEN-CROHNS STARTER) 40 MG/0.8ML PNKT Inject 40 mg into the skin every 14 (fourteen) days. 2 each   . ADVAIR DISKUS 500-50 MCG/DOSE AEPB INHALE 1 PUFF INTO THE LUNGS 2 TIMES DAILY 60 each 3  . albuterol (PROAIR HFA) 108 (90 BASE) MCG/ACT inhaler 2 puffs every 4 hours as needed- rescue (Patient taking differently: Inhale 2 puffs into the lungs every 4 (four) hours as needed for wheezing. 2 puffs every 4 hours as needed- rescue) 2 Inhaler prn  . albuterol (PROVENTIL) (2.5 MG/3ML) 0.083% nebulizer solution Take 3 mLs (2.5 mg total) by nebulization every 6 (six) hours as needed  for wheezing or shortness of breath. 150 mL 5  . ALPRAZolam (XANAX) 1 MG tablet 1 tab, three times daily as needed (Patient taking differently: Take 1 mg by mouth 3 (three) times daily as needed for anxiety. 1 tab, three times daily as needed) 90 tablet 5  . azithromycin (ZITHROMAX) 250 MG tablet Take 1 tablet (250 mg total) by mouth as directed. 6 tablet 0  . chlorpheniramine-HYDROcodone (TUSSIONEX) 10-8 MG/5ML SUER Take 5 mLs by mouth every 12 (twelve) hours. 140 mL 0  . fluticasone (FLONASE) 50 MCG/ACT nasal spray PLACE 2 SPRAYS INTO BOTH NOSTRILS DAILY. 16 g 2  . pantoprazole (PROTONIX) 20 MG tablet TAKE 1 TABLET (20 MG TOTAL) BY MOUTH DAILY BEFORE SUPPER. 30 tablet 5  . predniSONE (DELTASONE) 10 MG tablet Take 6 tabs PO daily x3 days,  Then Take 5 tabs PO daily x3days then 40 mg PO x3days, then 30mg  POx 3 days, then 20mg  x three days,then return to normal dose 10mg  daily. #60 60 tablet 0  . SPIRIVA HANDIHALER 18 MCG inhalation capsule PLACE 1 CAPSULE (18 MCG TOTAL) INTO INHALER AND INHALE DAILY. 30 capsule 4  . theophylline (UNIPHYL) 400 MG 24 hr tablet TAKE 1/2 TABLET BY MOUTH TWICE A DAY WITH A MEAL 30 tablet 9  . traZODone (DESYREL) 50 MG tablet Take 1 tablet (50 mg total) by mouth at bedtime.  90 tablet 3   No current facility-administered medications on file prior to visit.

## 2016-01-11 NOTE — Telephone Encounter (Signed)
Not sure how much prednisone she is on now. Would suggest 20 mg, # 5, 1 daily x 5 days, then stop.  Offer doxycycline 100 mg, # 14, 1 twice daily

## 2016-01-12 DIAGNOSIS — J441 Chronic obstructive pulmonary disease with (acute) exacerbation: Secondary | ICD-10-CM | POA: Diagnosis not present

## 2016-01-12 DIAGNOSIS — Z9981 Dependence on supplemental oxygen: Secondary | ICD-10-CM | POA: Diagnosis not present

## 2016-01-12 DIAGNOSIS — I509 Heart failure, unspecified: Secondary | ICD-10-CM | POA: Diagnosis not present

## 2016-01-12 DIAGNOSIS — J189 Pneumonia, unspecified organism: Secondary | ICD-10-CM | POA: Diagnosis not present

## 2016-01-17 ENCOUNTER — Emergency Department (HOSPITAL_COMMUNITY): Payer: Medicare Other

## 2016-01-17 ENCOUNTER — Telehealth: Payer: Self-pay

## 2016-01-17 ENCOUNTER — Inpatient Hospital Stay: Payer: Medicare Other | Admitting: Family Medicine

## 2016-01-17 ENCOUNTER — Telehealth: Payer: Self-pay | Admitting: Internal Medicine

## 2016-01-17 ENCOUNTER — Encounter (HOSPITAL_COMMUNITY): Payer: Self-pay | Admitting: Emergency Medicine

## 2016-01-17 ENCOUNTER — Inpatient Hospital Stay (HOSPITAL_COMMUNITY)
Admission: EM | Admit: 2016-01-17 | Discharge: 2016-01-19 | DRG: 190 | Disposition: A | Discharge status: Home Health | Payer: Medicare Other | Attending: Internal Medicine | Admitting: Internal Medicine

## 2016-01-17 ENCOUNTER — Inpatient Hospital Stay (HOSPITAL_COMMUNITY): Payer: Medicare Other

## 2016-01-17 ENCOUNTER — Ambulatory Visit (INDEPENDENT_AMBULATORY_CARE_PROVIDER_SITE_OTHER): Payer: Medicare Other | Admitting: Adult Health

## 2016-01-17 ENCOUNTER — Encounter: Payer: Self-pay | Admitting: Adult Health

## 2016-01-17 VITALS — BP 104/60 | HR 136 | Temp 99.0°F | Ht 61.0 in | Wt 132.8 lb

## 2016-01-17 DIAGNOSIS — Z87891 Personal history of nicotine dependence: Secondary | ICD-10-CM

## 2016-01-17 DIAGNOSIS — F419 Anxiety disorder, unspecified: Secondary | ICD-10-CM | POA: Diagnosis present

## 2016-01-17 DIAGNOSIS — Z9981 Dependence on supplemental oxygen: Secondary | ICD-10-CM

## 2016-01-17 DIAGNOSIS — R05 Cough: Secondary | ICD-10-CM

## 2016-01-17 DIAGNOSIS — R0789 Other chest pain: Secondary | ICD-10-CM

## 2016-01-17 DIAGNOSIS — R740 Nonspecific elevation of levels of transaminase and lactic acid dehydrogenase [LDH]: Secondary | ICD-10-CM | POA: Diagnosis not present

## 2016-01-17 DIAGNOSIS — N393 Stress incontinence (female) (male): Secondary | ICD-10-CM | POA: Diagnosis present

## 2016-01-17 DIAGNOSIS — R112 Nausea with vomiting, unspecified: Secondary | ICD-10-CM

## 2016-01-17 DIAGNOSIS — F329 Major depressive disorder, single episode, unspecified: Secondary | ICD-10-CM | POA: Diagnosis present

## 2016-01-17 DIAGNOSIS — R000 Tachycardia, unspecified: Secondary | ICD-10-CM | POA: Diagnosis present

## 2016-01-17 DIAGNOSIS — L409 Psoriasis, unspecified: Secondary | ICD-10-CM | POA: Diagnosis not present

## 2016-01-17 DIAGNOSIS — I509 Heart failure, unspecified: Secondary | ICD-10-CM | POA: Diagnosis not present

## 2016-01-17 DIAGNOSIS — J441 Chronic obstructive pulmonary disease with (acute) exacerbation: Principal | ICD-10-CM

## 2016-01-17 DIAGNOSIS — Z888 Allergy status to other drugs, medicaments and biological substances status: Secondary | ICD-10-CM

## 2016-01-17 DIAGNOSIS — Z7951 Long term (current) use of inhaled steroids: Secondary | ICD-10-CM | POA: Diagnosis not present

## 2016-01-17 DIAGNOSIS — Z79899 Other long term (current) drug therapy: Secondary | ICD-10-CM

## 2016-01-17 DIAGNOSIS — J45909 Unspecified asthma, uncomplicated: Secondary | ICD-10-CM | POA: Diagnosis present

## 2016-01-17 DIAGNOSIS — Z885 Allergy status to narcotic agent status: Secondary | ICD-10-CM | POA: Diagnosis not present

## 2016-01-17 DIAGNOSIS — Z880 Allergy status to penicillin: Secondary | ICD-10-CM

## 2016-01-17 DIAGNOSIS — R0602 Shortness of breath: Secondary | ICD-10-CM | POA: Diagnosis not present

## 2016-01-17 DIAGNOSIS — K760 Fatty (change of) liver, not elsewhere classified: Secondary | ICD-10-CM | POA: Diagnosis not present

## 2016-01-17 DIAGNOSIS — Z801 Family history of malignant neoplasm of trachea, bronchus and lung: Secondary | ICD-10-CM | POA: Diagnosis not present

## 2016-01-17 DIAGNOSIS — F1020 Alcohol dependence, uncomplicated: Secondary | ICD-10-CM | POA: Diagnosis present

## 2016-01-17 DIAGNOSIS — E876 Hypokalemia: Secondary | ICD-10-CM

## 2016-01-17 DIAGNOSIS — R059 Cough, unspecified: Secondary | ICD-10-CM

## 2016-01-17 DIAGNOSIS — J9621 Acute and chronic respiratory failure with hypoxia: Secondary | ICD-10-CM | POA: Diagnosis not present

## 2016-01-17 DIAGNOSIS — J449 Chronic obstructive pulmonary disease, unspecified: Secondary | ICD-10-CM | POA: Diagnosis not present

## 2016-01-17 DIAGNOSIS — R7989 Other specified abnormal findings of blood chemistry: Secondary | ICD-10-CM | POA: Diagnosis not present

## 2016-01-17 DIAGNOSIS — Z825 Family history of asthma and other chronic lower respiratory diseases: Secondary | ICD-10-CM

## 2016-01-17 DIAGNOSIS — J189 Pneumonia, unspecified organism: Secondary | ICD-10-CM | POA: Diagnosis not present

## 2016-01-17 DIAGNOSIS — R7401 Elevation of levels of liver transaminase levels: Secondary | ICD-10-CM

## 2016-01-17 DIAGNOSIS — Z7952 Long term (current) use of systemic steroids: Secondary | ICD-10-CM

## 2016-01-17 LAB — CBC WITH DIFFERENTIAL/PLATELET
BASOS ABS: 0 10*3/uL (ref 0.0–0.1)
Basophils Relative: 0 %
EOS ABS: 0 10*3/uL (ref 0.0–0.7)
EOS PCT: 0 %
HEMATOCRIT: 35.8 % — AB (ref 36.0–46.0)
HEMOGLOBIN: 11.5 g/dL — AB (ref 12.0–15.0)
LYMPHS PCT: 23 %
Lymphs Abs: 1.6 10*3/uL (ref 0.7–4.0)
MCH: 28.5 pg (ref 26.0–34.0)
MCHC: 32.1 g/dL (ref 30.0–36.0)
MCV: 88.8 fL (ref 78.0–100.0)
MONO ABS: 0.5 10*3/uL (ref 0.1–1.0)
Monocytes Relative: 7 %
Neutro Abs: 4.8 10*3/uL (ref 1.7–7.7)
Neutrophils Relative %: 70 %
Platelets: 314 10*3/uL (ref 150–400)
RBC: 4.03 MIL/uL (ref 3.87–5.11)
RDW: 13.9 % (ref 11.5–15.5)
WBC: 7 10*3/uL (ref 4.0–10.5)

## 2016-01-17 LAB — LIPASE, BLOOD: LIPASE: 34 U/L (ref 11–51)

## 2016-01-17 LAB — COMPREHENSIVE METABOLIC PANEL
ALBUMIN: 3.9 g/dL (ref 3.5–5.0)
ALK PHOS: 43 U/L (ref 38–126)
ALT: 70 U/L — AB (ref 14–54)
AST: 74 U/L — AB (ref 15–41)
Anion gap: 12 (ref 5–15)
BILIRUBIN TOTAL: 0.6 mg/dL (ref 0.3–1.2)
BUN: 6 mg/dL (ref 6–20)
CALCIUM: 8.7 mg/dL — AB (ref 8.9–10.3)
CO2: 34 mmol/L — ABNORMAL HIGH (ref 22–32)
CREATININE: 0.75 mg/dL (ref 0.44–1.00)
Chloride: 87 mmol/L — ABNORMAL LOW (ref 101–111)
GFR calc Af Amer: >60 mL/min (ref >60)
GLUCOSE: 108 mg/dL — AB (ref 65–99)
POTASSIUM: 2.9 mmol/L — AB (ref 3.5–5.1)
Sodium: 133 mmol/L — ABNORMAL LOW (ref 135–145)
TOTAL PROTEIN: 6.8 g/dL (ref 6.5–8.1)

## 2016-01-17 LAB — URINALYSIS, ROUTINE W REFLEX MICROSCOPIC
BILIRUBIN URINE: NEGATIVE
Glucose, UA: NEGATIVE mg/dL
Hgb urine dipstick: NEGATIVE
KETONES UR: NEGATIVE mg/dL
Leukocytes, UA: NEGATIVE
NITRITE: NEGATIVE
PH: 7 (ref 5.0–8.0)
Protein, ur: NEGATIVE mg/dL
Specific Gravity, Urine: 1.003 — ABNORMAL LOW (ref 1.005–1.030)

## 2016-01-17 LAB — I-STAT CG4 LACTIC ACID, ED: Lactic Acid, Venous: 1.87 mmol/L (ref 0.5–2.0)

## 2016-01-17 LAB — MRSA PCR SCREENING: MRSA by PCR: NEGATIVE

## 2016-01-17 MED ORDER — LEVOFLOXACIN 750 MG PO TABS
750.0000 mg | ORAL_TABLET | ORAL | Status: DC
  Administered 2016-01-18: 750 mg via ORAL
  Filled 2016-01-17: qty 1

## 2016-01-17 MED ORDER — THEOPHYLLINE ER 400 MG PO TB24
200.0000 mg | ORAL_TABLET | Freq: Two times a day (BID) | ORAL | Status: DC
Start: 2016-01-17 — End: 2016-01-18
  Administered 2016-01-17 – 2016-01-18 (×2): 200 mg via ORAL
  Filled 2016-01-17 (×4): qty 0.5

## 2016-01-17 MED ORDER — IPRATROPIUM-ALBUTEROL 0.5-2.5 (3) MG/3ML IN SOLN: 3.0000 mL | Freq: Four times a day (QID) | RESPIRATORY_TRACT | Status: DC

## 2016-01-17 MED ORDER — METHYLPREDNISOLONE SODIUM SUCC 125 MG IJ SOLR
60.0000 mg | Freq: Four times a day (QID) | INTRAMUSCULAR | Status: DC
  Administered 2016-01-17 – 2016-01-19 (×6): 60 mg via INTRAVENOUS
  Filled 2016-01-17 (×6): qty 2

## 2016-01-17 MED ORDER — IPRATROPIUM-ALBUTEROL 0.5-2.5 (3) MG/3ML IN SOLN: 3.0000 mL | RESPIRATORY_TRACT | Status: DC | PRN

## 2016-01-17 MED ORDER — ALPRAZOLAM 1 MG PO TABS
1.0000 mg | ORAL_TABLET | Freq: Three times a day (TID) | ORAL | Status: DC | PRN
  Administered 2016-01-18 – 2016-01-19 (×4): 1 mg via ORAL
  Filled 2016-01-17 (×5): qty 1

## 2016-01-17 MED ORDER — POTASSIUM CHLORIDE CRYS ER 20 MEQ PO TBCR
40.0000 meq | EXTENDED_RELEASE_TABLET | Freq: Once | ORAL | Status: AC
  Administered 2016-01-17: 40 meq via ORAL
  Filled 2016-01-17: qty 2

## 2016-01-17 MED ORDER — ALPRAZOLAM 0.5 MG PO TABS
1.0000 mg | ORAL_TABLET | Freq: Once | ORAL | Status: AC
  Administered 2016-01-17: 1 mg via ORAL
  Filled 2016-01-17: qty 2

## 2016-01-17 MED ORDER — ENOXAPARIN SODIUM 40 MG/0.4ML ~~LOC~~ SOLN
40.0000 mg | SUBCUTANEOUS | Status: DC
  Administered 2016-01-17 – 2016-01-18 (×2): 40 mg via SUBCUTANEOUS
  Filled 2016-01-17 (×2): qty 0.4

## 2016-01-17 MED ORDER — IPRATROPIUM-ALBUTEROL 0.5-2.5 (3) MG/3ML IN SOLN
3.0000 mL | Freq: Four times a day (QID) | RESPIRATORY_TRACT | Status: DC
  Administered 2016-01-17 – 2016-01-18 (×3): 3 mL via RESPIRATORY_TRACT
  Filled 2016-01-17 (×3): qty 3

## 2016-01-17 MED ORDER — METHYLPREDNISOLONE SODIUM SUCC 125 MG IJ SOLR: 60.0000 mg | Freq: Four times a day (QID) | INTRAMUSCULAR | Status: DC

## 2016-01-17 MED ORDER — ONDANSETRON HCL 4 MG/2ML IJ SOLN
4.0000 mg | Freq: Once | INTRAMUSCULAR | Status: AC
  Administered 2016-01-17: 4 mg via INTRAVENOUS
  Filled 2016-01-17: qty 2

## 2016-01-17 MED ORDER — TRAZODONE HCL 50 MG PO TABS
50.0000 mg | ORAL_TABLET | Freq: Every day | ORAL | Status: DC
  Administered 2016-01-17: 50 mg via ORAL
  Filled 2016-01-17: qty 1

## 2016-01-17 MED ORDER — LEVOFLOXACIN 750 MG PO TABS: 750.0000 mg | ORAL_TABLET | Freq: Every day | ORAL | Status: DC

## 2016-01-17 MED ORDER — METHYLPREDNISOLONE SODIUM SUCC 125 MG IJ SOLR
125.0000 mg | Freq: Once | INTRAMUSCULAR | Status: AC
  Administered 2016-01-17: 125 mg via INTRAVENOUS
  Filled 2016-01-17: qty 2

## 2016-01-17 MED ORDER — MORPHINE SULFATE (PF) 2 MG/ML IV SOLN
2.0000 mg | Freq: Once | INTRAVENOUS | Status: AC
  Administered 2016-01-17: 2 mg via INTRAVENOUS
  Filled 2016-01-17: qty 1

## 2016-01-17 MED ORDER — PANTOPRAZOLE SODIUM 40 MG PO TBEC
40.0000 mg | DELAYED_RELEASE_TABLET | Freq: Every day | ORAL | Status: DC
  Administered 2016-01-17 – 2016-01-19 (×3): 40 mg via ORAL
  Filled 2016-01-17 (×3): qty 1

## 2016-01-17 MED ORDER — LEVOFLOXACIN IN D5W 750 MG/150ML IV SOLN
750.0000 mg | Freq: Once | INTRAVENOUS | Status: AC
  Administered 2016-01-17: 750 mg via INTRAVENOUS
  Filled 2016-01-17: qty 150

## 2016-01-17 MED ORDER — TRAZODONE HCL 50 MG PO TABS: 50.0000 mg | ORAL_TABLET | Freq: Every day | ORAL | Status: DC

## 2016-01-17 MED ORDER — IPRATROPIUM BROMIDE 0.02 % IN SOLN
0.5000 mg | Freq: Once | RESPIRATORY_TRACT | Status: AC
  Administered 2016-01-17: 0.5 mg via RESPIRATORY_TRACT
  Filled 2016-01-17: qty 2.5

## 2016-01-17 MED ORDER — SODIUM CHLORIDE 0.9 % IV BOLUS (SEPSIS)
1000.0000 mL | Freq: Once | INTRAVENOUS | Status: AC
  Administered 2016-01-17: 1000 mL via INTRAVENOUS

## 2016-01-17 MED ORDER — ONDANSETRON HCL 4 MG/2ML IJ SOLN
4.0000 mg | Freq: Four times a day (QID) | INTRAMUSCULAR | Status: DC | PRN
  Administered 2016-01-18: 4 mg via INTRAVENOUS
  Filled 2016-01-17: qty 2

## 2016-01-17 MED ORDER — ACETAMINOPHEN 325 MG PO TABS: 650.0000 mg | ORAL_TABLET | Freq: Four times a day (QID) | ORAL | Status: DC | PRN

## 2016-01-17 MED ORDER — IOPAMIDOL (ISOVUE-370) INJECTION 76%: 100.0000 mL | Freq: Once | INTRAVENOUS | Status: DC | PRN

## 2016-01-17 MED ORDER — ACETAMINOPHEN 650 MG RE SUPP: 650.0000 mg | Freq: Four times a day (QID) | RECTAL | Status: DC | PRN

## 2016-01-17 MED ORDER — ALBUTEROL (5 MG/ML) CONTINUOUS INHALATION SOLN
10.0000 mg/h | INHALATION_SOLUTION | Freq: Once | RESPIRATORY_TRACT | Status: AC
  Administered 2016-01-17: 10 mg/h via RESPIRATORY_TRACT
  Filled 2016-01-17: qty 20

## 2016-01-17 MED ORDER — BUDESONIDE 0.25 MG/2ML IN SUSP
0.2500 mg | Freq: Two times a day (BID) | RESPIRATORY_TRACT | Status: DC
  Administered 2016-01-17 – 2016-01-19 (×4): 0.25 mg via RESPIRATORY_TRACT
  Filled 2016-01-17 (×4): qty 2

## 2016-01-17 MED ORDER — FLUTICASONE PROPIONATE 50 MCG/ACT NA SUSP
2.0000 | Freq: Every day | NASAL | Status: DC
  Administered 2016-01-18 – 2016-01-19 (×2): 2 via NASAL
  Filled 2016-01-17: qty 16

## 2016-01-17 MED ORDER — ONDANSETRON HCL 4 MG PO TABS
4.0000 mg | ORAL_TABLET | Freq: Four times a day (QID) | ORAL | Status: DC | PRN
  Administered 2016-01-17: 4 mg via ORAL
  Filled 2016-01-17: qty 1

## 2016-01-17 NOTE — ED Notes (Addendum)
Went to her PCP for SOB, vomiting over the last couple days after starting doxycyline for chest congestion. PCP sent her here for possible IV fluids/antibiotics. Pt tachycardic in triage and at 95% on her home O2  NP called saying they attempted a chest xray and EKG but were unsuccessful because patient felt too weak to get on table. They also said the patient had recently been exposed to pneumonia because her son has had it.

## 2016-01-17 NOTE — ED Notes (Signed)
Pt. Was put on bed pan was unable to urinate at this time.

## 2016-01-17 NOTE — Telephone Encounter (Signed)
Seen today by NP

## 2016-01-17 NOTE — Telephone Encounter (Signed)
Call received from [facility], nurse with [facility], requesting to cancel the patient's appointment at [facility] today. She said that the patient is " not feeling well" and she has scheduled her for an appointment with pulmonary today. [facility] said that the patient does not want to reschedule her [facility] appointment at this time and noted that the patient will call back at a later time to reschedule.

## 2016-01-17 NOTE — Telephone Encounter (Signed)
French Ana, Nurse from [facility] calling stating patient having nausea, vomiting, 99.3 fever, heart rate elevated, SATS 97%, productive cough with white sputum, not eating lack of appetite, body aches, patient states she just does not feel good.  Chest "achiness".  Patient is suppose to be seen at [facility] care team this morning at 1030, but French Ana calling to see if patient can be seen here, the community health and wellness does not have Xray abilities.  Patient was given Doxycycline, it caused loose stools, nausea and vomiting x 2 days, last dose was yesterday, she has not taken her dose today.  Allergies  Allergen Reactions  . Augmentin [Amoxicillin-Pot Clavulanate] Nausea Only  . Cefdinir Other (See Comments)    Aches, "heart fluttering"  . Other Nausea Only    PAIN MEDICATIONS-nausea  . Codeine Nausea Only  . Fexofenadine Nausea Only  . Hydralazine     Side effect Felt woozy   Current Outpatient Prescriptions on File Prior to Visit  Medication Sig Dispense Refill  . acetaminophen-codeine (TYLENOL #3) 300-30 MG tablet Take 1 tablet by mouth 2 (two) times daily as needed (Dyspnea). 60 tablet 0  . Adalimumab (HUMIRA PEN-CROHNS STARTER) 40 MG/0.8ML PNKT Inject 40 mg into the skin every 14 (fourteen) days. 2 each   . ADVAIR DISKUS 500-50 MCG/DOSE AEPB INHALE 1 PUFF INTO THE LUNGS 2 TIMES DAILY 60 each 3  . albuterol (PROAIR HFA) 108 (90 BASE) MCG/ACT inhaler 2 puffs every 4 hours as needed- rescue (Patient taking differently: Inhale 2 puffs into the lungs every 4 (four) hours as needed for wheezing. 2 puffs every 4 hours as needed- rescue) 2 Inhaler prn  . albuterol (PROVENTIL) (2.5 MG/3ML) 0.083% nebulizer solution Take 3 mLs (2.5 mg total) by nebulization every 6 (six) hours as needed for wheezing or shortness of breath. 150 mL 5  . ALPRAZolam (XANAX) 1 MG tablet 1 tab, three times daily as needed (Patient taking differently: Take 1 mg by mouth 3 (three) times daily as needed for  anxiety. 1 tab, three times daily as needed) 90 tablet 5  . azithromycin (ZITHROMAX) 250 MG tablet Take 1 tablet (250 mg total) by mouth as directed. 6 tablet 0  . chlorpheniramine-HYDROcodone (TUSSIONEX) 10-8 MG/5ML SUER Take 5 mLs by mouth every 12 (twelve) hours. 140 mL 0  . doxycycline (VIBRA-TABS) 100 MG tablet Take 1 tablet (100 mg total) by mouth 2 (two) times daily. 14 tablet 0  . fluticasone (FLONASE) 50 MCG/ACT nasal spray PLACE 2 SPRAYS INTO BOTH NOSTRILS DAILY. 16 g 2  . pantoprazole (PROTONIX) 20 MG tablet TAKE 1 TABLET (20 MG TOTAL) BY MOUTH DAILY BEFORE SUPPER. 30 tablet 5  . predniSONE (DELTASONE) 10 MG tablet Take 6 tabs PO daily x3 days,  Then Take 5 tabs PO daily x3days then 40 mg PO x3days, then 30mg  POx 3 days, then 20mg  x three days,then return to normal dose 10mg  daily. #60 60 tablet 0  . predniSONE (DELTASONE) 20 MG tablet Take 1 tablet (20 mg total) by mouth daily with breakfast. 5 tablet 0  . SPIRIVA HANDIHALER 18 MCG inhalation capsule PLACE 1 CAPSULE (18 MCG TOTAL) INTO INHALER AND INHALE DAILY. 30 capsule 4  . theophylline (UNIPHYL) 400 MG 24 hr tablet TAKE 1/2 TABLET BY MOUTH TWICE A DAY WITH A MEAL 30 tablet 9  . traZODone (DESYREL) 50 MG tablet Take 1 tablet (50 mg total) by mouth at bedtime. 90 tablet 3   No current facility-administered medications on file prior  to visit.

## 2016-01-17 NOTE — Progress Notes (Signed)
Patient assessed per protocol. Patient scored a 10 on assessment and was placed on QID duoneb treatments and PRN as well. Patient has a history of COPD, chronic O2 use, and recent intubation at [facility] (4 days intubated according to patient). Patient appeared anxious and asked RT to ask her RN about her xanax multiple times. BBS were diminished w/ mild exp wheezes present.  Patient is receiving a CAT at this time and will start scheduled treatments at 2000.  RT will continue to monitor patient.

## 2016-01-17 NOTE — Progress Notes (Signed)
Subjective:    Patient ID: Wendy [name], female    DOB: 1961-07-10, 55 y.o.   MRN: [id number]  HPI 55 year old female with history of end stage COPD (FEV1 19% predicted; 0.48L in 2014) on 3.5L home O2 followed by Dr. Maple Hudson CHF Grade 1 diastolic dysfunction. Now nocturnal BiPAP dependent .  Studies: 2D echo 3/20>>> EF 60-65%, PASP , grade 1 diastolic dysfunction  Has Psoriasis -previously on Humira   01/17/2016 Acute OV  Pt presents for an acute office visit. Complains of 1 week of cough, congestion that has really does not stop. She called in last week because son was dx with PNA and admitted to hospital. She wanted abx to make sure she did not sick again. She was called in Doxycycline on 01/11/16. Since starting she has had severe nausea. She started vomiting last night early am . Cant keep anything down. Did not take any meds. Due to n/v. No abd pain, or bloody stool. No urinary symptoms.  - she was given steroid taper and now back to baseline of prednisone 10mg  daily Feels cough and congesiton are back to baseline but she is too sick on her stomach.  On arrival to office she is too sick on stomach to get on exam table or go for cxr. Has dry heaves with paper bag.  2 hospitalization for COPD exacerbatioin in last 3 months . Was on vent in March  for hypercarbic RF with AECOPD.  CXR 4/6 with nad.  She is now on BIPAP At bedtime  For chronic hypercarbia RF , feels this is helping her breathing. She is on Advair and Spiriva .  She denies hemoptysis , chest pain, orthopnea or edema.    Past Medical History  Diagnosis Date  . Asthma   . Seasonal allergies   . Poor dentition   . Alcoholism (HCC)   . COPD (chronic obstructive pulmonary disease) (HCC)   . Depression   . Oxygen dependent     home oxygen 3L/min  . Urinary, incontinence, stress female   . Psoriasis    Current Outpatient Prescriptions on File Prior to Visit  Medication Sig Dispense Refill  . ADVAIR DISKUS 500-50  MCG/DOSE AEPB INHALE 1 PUFF INTO THE LUNGS 2 TIMES DAILY 60 each 3  . albuterol (PROAIR HFA) 108 (90 BASE) MCG/ACT inhaler 2 puffs every 4 hours as needed- rescue (Patient taking differently: Inhale 2 puffs into the lungs every 4 (four) hours as needed for wheezing. 2 puffs every 4 hours as needed- rescue) 2 Inhaler prn  . albuterol (PROVENTIL) (2.5 MG/3ML) 0.083% nebulizer solution Take 3 mLs (2.5 mg total) by nebulization every 6 (six) hours as needed for wheezing or shortness of breath. 150 mL 5  . ALPRAZolam (XANAX) 1 MG tablet 1 tab, three times daily as needed (Patient taking differently: Take 1 mg by mouth 3 (three) times daily as needed for anxiety. 1 tab, three times daily as needed) 90 tablet 5  . doxycycline (VIBRA-TABS) 100 MG tablet Take 1 tablet (100 mg total) by mouth 2 (two) times daily. 14 tablet 0  . fluticasone (FLONASE) 50 MCG/ACT nasal spray PLACE 2 SPRAYS INTO BOTH NOSTRILS DAILY. 16 g 2  . pantoprazole (PROTONIX) 20 MG tablet TAKE 1 TABLET (20 MG TOTAL) BY MOUTH DAILY BEFORE SUPPER. 30 tablet 5  . SPIRIVA HANDIHALER 18 MCG inhalation capsule PLACE 1 CAPSULE (18 MCG TOTAL) INTO INHALER AND INHALE DAILY. 30 capsule 4  . theophylline (UNIPHYL) 400 MG 24  hr tablet TAKE 1/2 TABLET BY MOUTH TWICE A DAY WITH A MEAL 30 tablet 9  . traZODone (DESYREL) 50 MG tablet Take 1 tablet (50 mg total) by mouth at bedtime. 90 tablet 3  . acetaminophen-codeine (TYLENOL #3) 300-30 MG tablet Take 1 tablet by mouth 2 (two) times daily as needed (Dyspnea). (Patient not taking: Reported on 01/17/2016) 60 tablet 0  . Adalimumab (HUMIRA PEN-CROHNS STARTER) 40 MG/0.8ML PNKT Inject 40 mg into the skin every 14 (fourteen) days. (Patient not taking: Reported on 01/17/2016) 2 each   . chlorpheniramine-HYDROcodone (TUSSIONEX) 10-8 MG/5ML SUER Take 5 mLs by mouth every 12 (twelve) hours. (Patient not taking: Reported on 01/17/2016) 140 mL 0   No current facility-administered medications on file prior to visit.       Review of Systems  Constitutional:   No  weight loss, night sweats,  Fevers, chills, + fatigue, or  lassitude.  HEENT:   No headaches,  Difficulty swallowing,  Tooth/dental problems, or  Sore throat,                No sneezing, itching, ear ache,  +nasal congestion, post nasal drip,   CV:  No chest pain,  Orthopnea, PND, swelling in lower extremities, anasarca, dizziness, palpitations, syncope.   GI  No, bloody stools.   Resp:    No chest wall deformity  Skin: no rash or lesions.  GU: no dysuria, change in color of urine, no urgency or frequency.  No flank pain, no hematuria   MS:  No joint pain or swelling.  No decreased range of motion.  No back pain.  Psych:  No change in mood or affect. No depression or anxiety.  No memory loss.         Objective:   Physical Exam   Filed Vitals:   01/17/16 1112  BP: 104/60  Pulse: 136  Temp: 99 F (37.2 C)  TempSrc: Oral  Height: 5\' 1"  (1.549 m)  Weight: 132 lb 12.8 oz (60.238 kg)  SpO2: 96%   Body mass index is 25.11 kg/(m^2).   GEN: A/Ox3; ill appearing  on o2 and in wc  HEENT:  [place]/AT,  EACs-clear, TMs-wnl, NOSE-clear, THROAT-clear, no lesions, no postnasal drip or exudate noted.   NECK:  Supple w/ fair ROM; no JVD; normal carotid impulses w/o bruits; no thyromegaly or nodules palpated; no lymphadenopathy.  RESP  Decreased BS in bases, .no accessory muscle use, no dullness to percussion  CARD:  ST  no m/r/g  , no peripheral edema, pulses intact, no cyanosis or clubbing.  GI:   Soft & nt; nml bowel sounds;no guarding  no organomegaly or masses detected.  Musco: Warm bil, no deformities or joint swelling noted.   Neuro: alert, no focal deficits noted.    Skin: Warm, no lesions or rashes   Jireh Elmore NP-C  [facility]  01/17/2016      Assessment & Plan:

## 2016-01-17 NOTE — Patient Instructions (Signed)
Go to [facility] .  Follow up with Dr. Maple Hudson  For follow up in 4 weeks and As needed

## 2016-01-17 NOTE — ED Provider Notes (Signed)
CSN: [id number]     Arrival date & time 01/17/16  1152 History   First MD Initiated Contact with Patient 01/17/16 1234     Chief Complaint  Patient presents with  . Fever  . Tachycardia     (Consider location/radiation/quality/duration/timing/severity/associated sxs/prior Treatment) HPI Comments: Patient here with increasing shortness of breath times several days. Has a history of COPD and was started on doxycycline several days ago but she is not getting better. Has had some nausea and vomiting which she feels could be from the antibiotic. Low-grade temperature at home with nonbilious emesis but no diarrhea. Endorses myalgias. No urinary symptoms. Went to her doctor today and was so weak that she could not have a chest x-ray. Patient sent here for further evaluation  Patient is a 55 y.o. female presenting with fever. The history is provided by the patient.  Fever   Past Medical History  Diagnosis Date  . Asthma   . Seasonal allergies   . Poor dentition   . Alcoholism (HCC)   . COPD (chronic obstructive pulmonary disease) (HCC)   . Depression   . Oxygen dependent     home oxygen 3L/min  . Urinary, incontinence, stress female   . Psoriasis    Past Surgical History  Procedure Laterality Date  . Cesarean section      x 2  . Tubal ligation     Family History  Problem Relation Age of Onset  . Lung cancer Father   . COPD Father    Social History  Substance Use Topics  . Smoking status: Former Smoker -- 1.00 packs/day for 40 years    Types: Cigarettes    Start date: 11/10/1970    Quit date: 09/17/2013  . Smokeless tobacco: Never Used  . Alcohol Use: No     Comment: 5 cans of beer daily  " 06/2014 drinks occasional ".  3s/2/16 No longer drink   OB History    No data available     Review of Systems  Constitutional: Positive for fever.  All other systems reviewed and are negative.     Allergies  Augmentin; Cefdinir; Other; Codeine; Fexofenadine; and  Hydralazine  Home Medications   Prior to Admission medications   Medication Sig Start Date End Date Taking? Authorizing Provider  acetaminophen-codeine (TYLENOL #3) 300-30 MG tablet Take 1 tablet by mouth 2 (two) times daily as needed (Dyspnea). Patient not taking: Reported on 01/17/2016 12/22/15   Waymon Budge, MD  Adalimumab (HUMIRA PEN-CROHNS STARTER) 40 MG/0.8ML PNKT Inject 40 mg into the skin every 14 (fourteen) days. Patient not taking: Reported on 01/17/2016 05/17/15   Josalyn Funches, MD  ADVAIR DISKUS 500-50 MCG/DOSE AEPB INHALE 1 PUFF INTO THE LUNGS 2 TIMES DAILY 12/27/15   Waymon Budge, MD  albuterol (PROAIR HFA) 108 (90 BASE) MCG/ACT inhaler 2 puffs every 4 hours as needed- rescue Patient taking differently: Inhale 2 puffs into the lungs every 4 (four) hours as needed for wheezing. 2 puffs every 4 hours as needed- rescue 03/02/15   Waymon Budge, MD  albuterol (PROVENTIL) (2.5 MG/3ML) 0.083% nebulizer solution Take 3 mLs (2.5 mg total) by nebulization every 6 (six) hours as needed for wheezing or shortness of breath. 12/26/15   Waymon Budge, MD  ALPRAZolam Prudy Feeler) 1 MG tablet 1 tab, three times daily as needed Patient taking differently: Take 1 mg by mouth 3 (three) times daily as needed for anxiety. 1 tab, three times daily as needed 11/14/15   [facility]  Magda Kiel, MD  chlorpheniramine-HYDROcodone (TUSSIONEX) 10-8 MG/5ML SUER Take 5 mLs by mouth every 12 (twelve) hours. Patient not taking: Reported on 01/17/2016 11/30/15   Denton Brick, MD  doxycycline (VIBRA-TABS) 100 MG tablet Take 1 tablet (100 mg total) by mouth 2 (two) times daily. 01/11/16   Waymon Budge, MD  fluticasone (FLONASE) 50 MCG/ACT nasal spray PLACE 2 SPRAYS INTO BOTH NOSTRILS DAILY. 10/28/15   Waymon Budge, MD  pantoprazole (PROTONIX) 20 MG tablet TAKE 1 TABLET (20 MG TOTAL) BY MOUTH DAILY BEFORE SUPPER. 08/24/15   Waymon Budge, MD  predniSONE (DELTASONE) 10 MG tablet Take 10 mg by mouth daily with breakfast.     Historical Provider, MD  SPIRIVA HANDIHALER 18 MCG inhalation capsule PLACE 1 CAPSULE (18 MCG TOTAL) INTO INHALER AND INHALE DAILY. 10/18/15   Waymon Budge, MD  theophylline (UNIPHYL) 400 MG 24 hr tablet TAKE 1/2 TABLET BY MOUTH TWICE A DAY WITH A MEAL 01/06/16   Waymon Budge, MD  traZODone (DESYREL) 50 MG tablet Take 1 tablet (50 mg total) by mouth at bedtime. 11/07/15   Waymon Budge, MD   BP 127/80 mmHg  Pulse 125  Temp(Src) 98.3 F (36.8 C) (Oral)  Resp 23  Ht 5\' 1"  (1.549 m)  Wt 59.875 kg  BMI 24.95 kg/m2  SpO2 96% Physical Exam  Constitutional: She is oriented to person, place, and time. She appears well-developed and well-nourished.  Non-toxic appearance. No distress.  HENT:  Head: Normocephalic and atraumatic.  Eyes: Conjunctivae, EOM and lids are normal. Pupils are equal, round, and reactive to light.  Neck: Normal range of motion. Neck supple. No tracheal deviation present. No thyroid mass present.  Cardiovascular: Regular rhythm and normal heart sounds.  Tachycardia present.  Exam reveals no gallop.   No murmur heard. Pulmonary/Chest: Effort normal. No stridor. No respiratory distress. She has decreased breath sounds. She has wheezes. She has no rhonchi. She has no rales.  Abdominal: Soft. Normal appearance and bowel sounds are normal. She exhibits no distension. There is no tenderness. There is no rebound and no CVA tenderness.  Musculoskeletal: Normal range of motion. She exhibits no edema or tenderness.  Neurological: She is alert and oriented to person, place, and time. She has normal strength. No cranial nerve deficit or sensory deficit. GCS eye subscore is 4. GCS verbal subscore is 5. GCS motor subscore is 6.  Skin: Skin is warm and dry. No abrasion and no rash noted.  Psychiatric: She has a normal mood and affect. Her speech is normal and behavior is normal.  Nursing note and vitals reviewed.   ED Course  Procedures (including critical care time) Labs  Review Labs Reviewed  CULTURE, BLOOD (ROUTINE X 2)  CULTURE, BLOOD (ROUTINE X 2)  URINE CULTURE  COMPREHENSIVE METABOLIC PANEL  CBC WITH DIFFERENTIAL/PLATELET  URINALYSIS, ROUTINE W REFLEX MICROSCOPIC (NOT AT [facility])  I-STAT CG4 LACTIC ACID, ED    Imaging Review Dg Chest Port 1 View  01/17/2016  CLINICAL DATA:  Shortness of Breath, tachycardia EXAM: PORTABLE CHEST 1 VIEW COMPARISON:  12/22/2015 FINDINGS: Cardiomediastinal silhouette is unremarkable. No acute infiltrate or pleural effusion. No pulmonary edema. Stable calcified granuloma in right lower lobe. IMPRESSION: No active disease. Electronically Signed   By: 02/21/2016 M.D.   On: 01/17/2016 12:58   I have personally reviewed and evaluated these images and lab results as part of my medical decision-making.   EKG Interpretation   Date/Time:  Tuesday Jan 17 2016  12:37:54 EDT Ventricular Rate:  128 PR Interval:  79 QRS Duration: 116 QT Interval:  364 QTC Calculation: 531 R Axis:   -37 Text Interpretation:  Sinus or ectopic atrial tachycardia Atrial premature  complex LAD, consider left anterior fascicular block Low voltage,  precordial leads Baseline wander in lead(s) V2 Confirmed by Lisbeth Puller  MD,  Kimeka Badour ([id number]) on 01/17/2016 2:56:23 PM      MDM   Final diagnoses:  None    Patient initially moderately tachycardic here with heart rate in the 130s. This improved after I V fluids. Patient's potassium replenished.. IV fluids given. Treated with IV Solu-Medrol, albuterol, Xanax for anxiety. Patient remains tachycardic. She also had wheezing as well 2. Chest x-ray without infiltrate. Will admit for COPD exacerbation    Lorre Nick, MD 01/17/16 1600

## 2016-01-17 NOTE — Assessment & Plan Note (Signed)
End stage COPD with recent recurrent exacerbation -appears COPD is somewhat stable as cough and congesiton are back to baseline with adequate O2 sats on O2 with no increased O2 demands.  Case discussed with Dr. Maple Hudson  , pt will go to ER for further evaluation as appears intolerant to Doxycyclie with severe n/v .  Will need CXR to make sure no PNA or acute process present.  ER at [facility] contacted that pt is en route.

## 2016-01-17 NOTE — ED Notes (Signed)
Bed: WA11 Expected date:  Expected time:  Means of arrival:  Comments: TR1 

## 2016-01-17 NOTE — Assessment & Plan Note (Signed)
?  side effect of abx- appears to be dehydrated with tachycardia  Pt unable to get on exam table for EKG or go for CXR to r/o PNA  Will need to go to ER for further evaluation with labs, cxr and EKG  Case discussed with Dr. Maple Hudson  .  Pt declnied EMS transport. To go by private vehicle with mother.  Please contact office for sooner follow up if symptoms do not improve or worsen or seek emergency care

## 2016-01-17 NOTE — H&P (Signed)
History and Physical  Wendy [name] QJJ:[id number] DOB: 1961-07-27 DOA: 01/17/2016   PCP: Lora Paula, MD  Referring Physician: ED/ Dr. Lorre Nick Outpatient Specialists:  Pulmonary--Dr. Maple Hudson  Patient coming from: Home with son and boyfriend Chief Complaint: dyspnea  HPI:  Wendy [name] is a 55 y.o. female with medical history of COPD, chronic respiratory failure on 3 L nasal cannula presented with one-week history of worsening shortness breath and productive cough with yellow sputum. The patient contacted her pulmonary office whom prescribed doxycycline on 01/11/2016, and increased her prednisone to 20 mg daily. After 3-4 days of doxycycline, the patient began having nausea and vomiting, she stopped taking the doxycycline. However, she continued to take the prednisone that she finished yesterday. Her shortness of breath did not improve. She went to see her pulmonary physician on 01/17/2016 and was sent to the emergency department for further evaluation due to her respiratory distress. The patient has been complaining of low-grade fevers up to 100.59F. She denies any headache, neck pain, rashes, diarrhea, abdominal pain, dysuria, hematuria. The patient lives with her son and boyfriend. She states that her son had pneumonia approximately one week prior to this admission and required admission to the hospital. She states that he was just recently discharged from the hospital 3 days prior.  She feels like this may have been the etiology of her decompensation.  In the emergency department, the patient was given an hour-long nebulizer treatment with intravenous levofloxacin and Solu-Medrol with some improvement of her respiratory status. However the patient continued to have shortness of breath and wheezing. The patient was given levofloxacin and 2 L normal saline. Potassium was 2.9 and sodium 133. CBC was unremarkable. AST was 74, ALT 70, alkaline phosphatase 43, total bilirubin 0.6.  Chest x-ray was negative for any infiltrates. Lactic acid 1.7. EKG showed sinus tachycardia versus atrial tachycardia with nonspecific ST changes.    Assessment/Plan: Acute on chronic respiratory failure with hypoxia -Secondary to COPD exacerbation -Continue intravenous Solu-Medrol -Start Pulmicort nebulizer -Continue aerosolized and begun on Atrovent -Continue levofloxacin -Normally maintained on 3 L nasal cannula at home -respiratory viral panel  Tachycardia -Likely due to acute medical condition -The patient denies any chest pain or dizziness -A.m. TSH -monitor clinically  Transaminasemia with nausea and vomiting -Right upper quadrant ultrasound -Check lipase is doxycycline may cause pancreatitis -Nausea and vomiting likely due to doxycycline as the patient has had this reaction in the past -No vomiting since arrival to the ED -UA neg for pyuria  Hypokalemia -Repleted -Check magnesium  Anxiety -continue home dose xanax  Prolonged QTc -repeat EKG in am while on levofloxacin -d/c trazadone         Past Medical History  Diagnosis Date  . Asthma   . Seasonal allergies   . Poor dentition   . Alcoholism (HCC)   . COPD (chronic obstructive pulmonary disease) (HCC)   . Depression   . Oxygen dependent     home oxygen 3L/min  . Urinary, incontinence, stress female   . Psoriasis    Past Surgical History  Procedure Laterality Date  . Cesarean section      x 2  . Tubal ligation     Social History:  reports that she quit smoking about 2 years ago. Her smoking use included Cigarettes. She started smoking about 45 years ago. She has a 40 pack-year smoking history. She has never used smokeless tobacco. She reports that she does not drink alcohol or  use illicit drugs.   Family History  Problem Relation Age of Onset  . Lung cancer Father   . COPD Father      Allergies  Allergen Reactions  . Augmentin [Amoxicillin-Pot Clavulanate] Nausea Only    Has patient  had a PCN reaction causing immediate rash, facial/tongue/throat swelling, SOB or lightheadedness with hypotension: No Has patient had a PCN reaction causing severe rash involving mucus membranes or skin necrosis: No Has patient had a PCN reaction that required hospitalization No Has patient had a PCN reaction occurring within the last 10 years: Yes If all of the above answers are "NO", then may proceed with Cephalosporin use.   . Cefdinir Other (See Comments)    Aches, "heart fluttering"  . Doxycycline Nausea And Vomiting  . Other Nausea Only    PAIN MEDICATIONS-nausea  . Codeine Nausea Only  . Fexofenadine Nausea Only  . Hydralazine     Side effect Felt woozy     Prior to Admission medications   Medication Sig Start Date End Date Taking? Authorizing Provider  ADVAIR DISKUS 500-50 MCG/DOSE AEPB INHALE 1 PUFF INTO THE LUNGS 2 TIMES DAILY 12/27/15  Yes Waymon Budge, MD  albuterol (PROAIR HFA) 108 (90 BASE) MCG/ACT inhaler 2 puffs every 4 hours as needed- rescue Patient taking differently: Inhale 2 puffs into the lungs every 4 (four) hours as needed for wheezing.  03/02/15  Yes Waymon Budge, MD  albuterol (PROVENTIL) (2.5 MG/3ML) 0.083% nebulizer solution Take 3 mLs (2.5 mg total) by nebulization every 6 (six) hours as needed for wheezing or shortness of breath. 12/26/15  Yes Waymon Budge, MD  ALPRAZolam Prudy Feeler) 1 MG tablet 1 tab, three times daily as needed Patient taking differently: Take 1 mg by mouth 3 (three) times daily as needed for anxiety. 1 tab, three times daily as needed 11/14/15  Yes Waymon Budge, MD  doxycycline (VIBRA-TABS) 100 MG tablet Take 1 tablet (100 mg total) by mouth 2 (two) times daily. Patient taking differently: Take 100 mg by mouth 2 (two) times daily. Started 04/26 for 7 days 01/11/16  Yes Waymon Budge, MD  fluticasone (FLONASE) 50 MCG/ACT nasal spray PLACE 2 SPRAYS INTO BOTH NOSTRILS DAILY. 10/28/15  Yes Waymon Budge, MD  ibuprofen (ADVIL,MOTRIN)  200 MG tablet Take 200 mg by mouth every 6 (six) hours as needed for fever, headache, mild pain, moderate pain or cramping.   Yes Historical Provider, MD  pantoprazole (PROTONIX) 20 MG tablet TAKE 1 TABLET (20 MG TOTAL) BY MOUTH DAILY BEFORE SUPPER. 08/24/15  Yes Waymon Budge, MD  predniSONE (DELTASONE) 10 MG tablet Take 10 mg by mouth daily with breakfast. Reported on 01/17/2016   Yes Historical Provider, MD  predniSONE (DELTASONE) 20 MG tablet Take 20 mg by mouth daily with breakfast. Started 04/26 for 5 days   Yes Historical Provider, MD  SPIRIVA HANDIHALER 18 MCG inhalation capsule PLACE 1 CAPSULE (18 MCG TOTAL) INTO INHALER AND INHALE DAILY. 10/18/15  Yes Waymon Budge, MD  theophylline (UNIPHYL) 400 MG 24 hr tablet TAKE 1/2 TABLET BY MOUTH TWICE A DAY WITH A MEAL 01/06/16  Yes Waymon Budge, MD  traZODone (DESYREL) 50 MG tablet Take 1 tablet (50 mg total) by mouth at bedtime. 11/07/15  Yes Waymon Budge, MD  acetaminophen-codeine (TYLENOL #3) 300-30 MG tablet Take 1 tablet by mouth 2 (two) times daily as needed (Dyspnea). Patient not taking: Reported on 01/17/2016 12/22/15   Waymon Budge, MD  Adalimumab (  HUMIRA PEN-CROHNS STARTER) 40 MG/0.8ML PNKT Inject 40 mg into the skin every 14 (fourteen) days. Patient not taking: Reported on 01/17/2016 05/17/15   Dessa Phi, MD  chlorpheniramine-HYDROcodone (TUSSIONEX) 10-8 MG/5ML SUER Take 5 mLs by mouth every 12 (twelve) hours. Patient not taking: Reported on 01/17/2016 11/30/15   Denton Brick, MD    Review of Systems:  Constitutional:  No weight loss, night sweats,  Head&Eyes: No headache.  No vision loss.  No eye pain or scotoma ENT:  No Difficulty swallowing,Tooth/dental problems,Sore throat,  No ear ache, post nasal drip,  Cardio-vascular:  No chest pain, Orthopnea, PND, swelling in lower extremities,  dizziness, palpitations  GI:  No  abdominal pain, diarrhea, loss of appetite, hematochezia, melena, heartburn, indigestion, Resp:  .No  chest wall deformity  Skin:  no rash or lesions.  GU:  no dysuria, change in color of urine, no urgency or frequency. No flank pain.  Musculoskeletal:  No joint pain or swelling. No decreased range of motion. No back pain.  Psych:  No change in mood or affect.  Neurologic: No headache, no dysesthesia, no focal weakness, no vision loss. No syncope  Physical Exam: Filed Vitals:   01/17/16 1722 01/17/16 1730 01/17/16 1845 01/17/16 1923  BP: 113/81 130/103 114/69   Pulse: 135 140 125   Temp:    97.9 F (36.6 C)  TempSrc:    Oral  Resp: 15 17 17    Height:      Weight:      SpO2: 99% 100% 98%    General:  A&O x 3, NAD, nontoxic, pleasant/cooperative Head/Eye: No conjunctival hemorrhage, no icterus, [place]/AT, No nystagmus ENT:  No icterus,  No thrush, good dentition, no pharyngeal exudate Neck:  No masses, no lymphadenpathy, no bruits CV:  RRR, no rub, no gallop, no S3 Lung:  bilateral expiratory wheeze with diminished breath sounds at the bases.  Abdomen: soft/NT, +BS, nondistended, no peritoneal signs Ext: No cyanosis, No rashes, No petechiae, No lymphangitis, No edema   Labs on Admission:  Basic Metabolic Panel:  Recent Labs Lab 01/17/16 1318  NA 133*  K 2.9*  CL 87*  CO2 34*  GLUCOSE 108*  BUN 6  CREATININE 0.75  CALCIUM 8.7*   Liver Function Tests:  Recent Labs Lab 01/17/16 1318  AST 74*  ALT 70*  ALKPHOS 43  BILITOT 0.6  PROT 6.8  ALBUMIN 3.9    Recent Labs Lab 01/17/16 1318  LIPASE 34   No results for input(s): AMMONIA in the last 168 hours. CBC:  Recent Labs Lab 01/17/16 1318  WBC 7.0  NEUTROABS 4.8  HGB 11.5*  HCT 35.8*  MCV 88.8  PLT 314   Coagulation Profile: No results for input(s): INR, PROTIME in the last 168 hours. Cardiac Enzymes: No results for input(s): CKTOTAL, CKMB, CKMBINDEX, TROPONINI in the last 168 hours. BNP: Invalid input(s): POCBNP CBG: No results for input(s): GLUCAP in the last 168 hours. Urine analysis:      Component Value Date/Time   COLORURINE YELLOW 01/17/2016 1508   APPEARANCEUR CLEAR 01/17/2016 1508   LABSPEC 1.003* 01/17/2016 1508   PHURINE 7.0 01/17/2016 1508   GLUCOSEU NEGATIVE 01/17/2016 1508   HGBUR NEGATIVE 01/17/2016 1508   BILIRUBINUR NEGATIVE 01/17/2016 1508   KETONESUR NEGATIVE 01/17/2016 1508   PROTEINUR NEGATIVE 01/17/2016 1508   UROBILINOGEN 0.2 09/19/2010 1112   NITRITE NEGATIVE 01/17/2016 1508   LEUKOCYTESUR NEGATIVE 01/17/2016 1508   Sepsis Labs: @LABRCNTIP (procalcitonin:4,lacticidven:4) ) Recent Results (from the past 240 hour(s))  Blood Culture (routine x 2)     Status: None (Preliminary result)   Collection Time: 01/17/16  1:06 PM  Result Value Ref Range Status   Specimen Description BLOOD RIGHT FOREARM  Final   Special Requests IN PEDIATRIC BOTTLE 2CC  Final   Culture PENDING  Incomplete   Report Status PENDING  Incomplete  MRSA PCR Screening     Status: None   Collection Time: 01/17/16  6:25 PM  Result Value Ref Range Status   MRSA by PCR NEGATIVE NEGATIVE Final    Comment:        The GeneXpert MRSA Assay (FDA approved for NASAL specimens only), is one component of a comprehensive MRSA colonization surveillance program. It is not intended to diagnose MRSA infection nor to guide or monitor treatment for MRSA infections.      Radiological Exams on Admission: Dg Chest Port 1 View  01/17/2016  CLINICAL DATA:  Shortness of Breath, tachycardia EXAM: PORTABLE CHEST 1 VIEW COMPARISON:  12/22/2015 FINDINGS: Cardiomediastinal silhouette is unremarkable. No acute infiltrate or pleural effusion. No pulmonary edema. Stable calcified granuloma in right lower lobe. IMPRESSION: No active disease. Electronically Signed   By: Natasha Mead M.D.   On: 01/17/2016 12:58   US Abdomen Limited Ruq  01/17/2016  CLINICAL DATA:  Abnormal liver function tests. EXAM: US ABDOMEN LIMITED - RIGHT UPPER QUADRANT COMPARISON:  None. FINDINGS: There are mild study limitations due to  inability of the patient to lie flat or control respirations. Gallbladder: Nonfasting exam. The gallbladder is not distended. No abnormalities are evident. Common bile duct: Diameter: Normal, 2.5 mm. Liver: There is diffusely increased echogenicity of the liver consistent with fatty infiltration. No focal liver lesion is evident. There is no intrahepatic bile duct dilatation. IMPRESSION: Increased hepatic parenchymal echogenicity consistent with fatty liver or other diffuse hepatocellular disease. No focal liver lesion. Unremarkable biliary system. Electronically Signed   By: Ellery Plunk M.D.   On: 01/17/2016 19:40    EKG: Independently reviewed. Sinus vs atrial tachycardia with nonspecific ST changes    Time spent:60 minutes Code Status:   FULL--confirmed with patient even though previously listed DNR Family Communication:  No Family at bedside Disposition Plan: expect 2-3day hospitalization Consults called: none DVT Prophylaxis:  Lovenox  Bianna Haran, DO  [facility] Pager [phone]  If 7PM-7AM, please contact night-coverage [email or web address] Password [email or web address] 01/17/2016, 7:54 PM

## 2016-01-17 NOTE — Telephone Encounter (Signed)
Patient scheduled to see TP at 10:45am.  Per Dr. Maple Hudson, CXR ordered and patient advised to check in and then go down for CXR prior to visit. Chan notified at front desk. Order entered for CXR. Nothing further needed.

## 2016-01-18 DIAGNOSIS — I4581 Long QT syndrome: Secondary | ICD-10-CM

## 2016-01-18 DIAGNOSIS — R7989 Other specified abnormal findings of blood chemistry: Secondary | ICD-10-CM

## 2016-01-18 DIAGNOSIS — I471 Supraventricular tachycardia: Secondary | ICD-10-CM

## 2016-01-18 LAB — CBC
HCT: 31.9 % — ABNORMAL LOW (ref 36.0–46.0)
Hemoglobin: 10.2 g/dL — ABNORMAL LOW (ref 12.0–15.0)
MCH: 28.5 pg (ref 26.0–34.0)
MCHC: 32 g/dL (ref 30.0–36.0)
MCV: 89.1 fL (ref 78.0–100.0)
PLATELETS: 289 10*3/uL (ref 150–400)
RBC: 3.58 MIL/uL — ABNORMAL LOW (ref 3.87–5.11)
RDW: 13.9 % (ref 11.5–15.5)
WBC: 3.9 10*3/uL — AB (ref 4.0–10.5)

## 2016-01-18 LAB — COMPREHENSIVE METABOLIC PANEL
ALBUMIN: 3.4 g/dL — AB (ref 3.5–5.0)
ALT: 61 U/L — AB (ref 14–54)
AST: 51 U/L — AB (ref 15–41)
Alkaline Phosphatase: 38 U/L (ref 38–126)
Anion gap: 8 (ref 5–15)
BILIRUBIN TOTAL: 0.5 mg/dL (ref 0.3–1.2)
CHLORIDE: 100 mmol/L — AB (ref 101–111)
CO2: 30 mmol/L (ref 22–32)
CREATININE: 0.66 mg/dL (ref 0.44–1.00)
Calcium: 8 mg/dL — ABNORMAL LOW (ref 8.9–10.3)
GFR calc Af Amer: >60 mL/min (ref >60)
GLUCOSE: 142 mg/dL — AB (ref 65–99)
POTASSIUM: 4.2 mmol/L (ref 3.5–5.1)
Sodium: 138 mmol/L (ref 135–145)
TOTAL PROTEIN: 6 g/dL — AB (ref 6.5–8.1)

## 2016-01-18 LAB — RESPIRATORY PANEL BY PCR
Adenovirus: DETECTED — AB
BORDETELLA PERTUSSIS-RVPCR: NOT DETECTED
CHLAMYDOPHILA PNEUMONIAE-RVPPCR: NOT DETECTED
Coronavirus 229E: NOT DETECTED
Coronavirus HKU1: NOT DETECTED
Coronavirus NL63: NOT DETECTED
Coronavirus OC43: NOT DETECTED
INFLUENZA A H1-RVPPCR: NOT DETECTED
INFLUENZA A-RVPPCR: NOT DETECTED
Influenza A H1 2009: NOT DETECTED
Influenza A H3: NOT DETECTED
Influenza B: NOT DETECTED
METAPNEUMOVIRUS-RVPPCR: NOT DETECTED
Mycoplasma pneumoniae: NOT DETECTED
PARAINFLUENZA VIRUS 2-RVPPCR: NOT DETECTED
PARAINFLUENZA VIRUS 3-RVPPCR: NOT DETECTED
Parainfluenza Virus 1: NOT DETECTED
Parainfluenza Virus 4: NOT DETECTED
RESPIRATORY SYNCYTIAL VIRUS-RVPPCR: NOT DETECTED
RHINOVIRUS / ENTEROVIRUS - RVPPCR: NOT DETECTED

## 2016-01-18 LAB — RAPID URINE DRUG SCREEN, HOSP PERFORMED
Amphetamines: NOT DETECTED
Barbiturates: NOT DETECTED
Benzodiazepines: POSITIVE — AB
Cocaine: NOT DETECTED
Opiates: POSITIVE — AB
Tetrahydrocannabinol: NOT DETECTED

## 2016-01-18 LAB — GLUCOSE, CAPILLARY: Glucose-Capillary: 173 mg/dL — ABNORMAL HIGH (ref 65–99)

## 2016-01-18 LAB — MAGNESIUM
MAGNESIUM: 1.1 mg/dL — AB (ref 1.7–2.4)
MAGNESIUM: 2 mg/dL (ref 1.7–2.4)

## 2016-01-18 MED ORDER — IPRATROPIUM BROMIDE 0.02 % IN SOLN
0.5000 mg | Freq: Three times a day (TID) | RESPIRATORY_TRACT | Status: DC
  Administered 2016-01-19: 0.5 mg via RESPIRATORY_TRACT
  Filled 2016-01-18: qty 2.5

## 2016-01-18 MED ORDER — LEVALBUTEROL HCL 1.25 MG/0.5ML IN NEBU
1.2500 mg | INHALATION_SOLUTION | Freq: Three times a day (TID) | RESPIRATORY_TRACT | Status: DC
  Administered 2016-01-19: 1.25 mg via RESPIRATORY_TRACT
  Filled 2016-01-18: qty 0.5

## 2016-01-18 MED ORDER — IPRATROPIUM BROMIDE 0.02 % IN SOLN
0.5000 mg | Freq: Three times a day (TID) | RESPIRATORY_TRACT | Status: DC
  Administered 2016-01-18 (×2): 0.5 mg via RESPIRATORY_TRACT
  Filled 2016-01-18 (×2): qty 2.5

## 2016-01-18 MED ORDER — MORPHINE SULFATE (PF) 2 MG/ML IV SOLN
1.0000 mg | Freq: Every evening | INTRAVENOUS | Status: DC | PRN
  Administered 2016-01-19: 1 mg via INTRAVENOUS
  Filled 2016-01-18: qty 1

## 2016-01-18 MED ORDER — LEVALBUTEROL HCL 1.25 MG/0.5ML IN NEBU
1.2500 mg | INHALATION_SOLUTION | Freq: Three times a day (TID) | RESPIRATORY_TRACT | Status: DC
  Administered 2016-01-18 (×2): 1.25 mg via RESPIRATORY_TRACT
  Filled 2016-01-18 (×2): qty 0.5

## 2016-01-18 MED ORDER — TRAZODONE HCL 50 MG PO TABS
50.0000 mg | ORAL_TABLET | Freq: Every evening | ORAL | Status: DC | PRN
  Administered 2016-01-18: 50 mg via ORAL
  Filled 2016-01-18: qty 1

## 2016-01-18 MED ORDER — THEOPHYLLINE ER 400 MG PO TB24: 200.0000 mg | ORAL_TABLET | Freq: Every day | ORAL | Status: DC

## 2016-01-18 MED ORDER — GUAIFENESIN ER 600 MG PO TB12
600.0000 mg | ORAL_TABLET | Freq: Two times a day (BID) | ORAL | Status: DC
  Administered 2016-01-18 – 2016-01-19 (×3): 600 mg via ORAL
  Filled 2016-01-18 (×3): qty 1

## 2016-01-18 MED ORDER — MAGNESIUM SULFATE 2 GM/50ML IV SOLN
2.0000 g | Freq: Once | INTRAVENOUS | Status: AC
  Administered 2016-01-18: 2 g via INTRAVENOUS
  Filled 2016-01-18: qty 50

## 2016-01-18 NOTE — Progress Notes (Addendum)
PROGRESS NOTE  Wendy [name] FXT:[id number] DOB: 1961-04-20 DOA: 01/17/2016 PCP: Lora Paula, MD  HPI/Recap of past 24 hours:  Feeling better, cough is more productive. Some tacycardia, no fever, no chest pain, no active n/v. No ab pain. No constipation, no diarrhea Reported his son has pneumonia recently  Assessment/Plan: Active Problems:   Acute on chronic respiratory failure with hypoxemia (HCC)   COPD exacerbation (HCC)   Transaminasemia   Hypokalemia   Nausea and vomiting  Acute on chronic respiratory failure with hypoxia on home o2 3liters and on bipap at night -Secondary to COPD exacerbation -Continue intravenous Solu-Medrol/nebs( changed to xopenex/atrovent due to tachycardia)/muxinex/02 supplement -multiple abx allergy and intolerance, Continue levofloxacin for now, repeat ekg, if Qt remain prolonged will need to change abx , consider aztreonam -cxr no acute findings, respiratory viral panel sent , result pending  Tachycardia, atrial tachycardia?  -Likely due to acute medical condition -The patient denies any chest pain or dizziness -A.m. TSH, pending, taper theophyline, change nebs to xopenex, keep mag>2, k >4   Prolonged QTc, QTc 531 on admission,  -repeat EKG  QTc 477 on 5/3 while on levofloxacin, keep k >4, mag >2. -d/c trazadone  Hypokalemia/hypomagnesemia -Replace k and mag, repeat lab in am  Transaminasemia with nausea and vomiting -Right upper quadrant ultrasound with fatty liver, lipase wnl, hepatitis panel pending, patient also reported alcohol use in the past -Nausea and vomiting likely due to doxycycline as the patient has had this reaction in the past -No vomiting since arrival to the ED -UA neg for pyuria   Anxiety -continue home dose xanax   Immunosuppressed states, patient has been on humira for 9 months for psoriasis, this was stopped two months ago due to pna, she also has been on daily presdnisone 10-20mg  for almost a year.  Code  Status: full, confirmed with the patient  Family Communication: patient   Disposition Plan: home, likely before Saturday if condition stable, patient need to have outpatient sleep study to continue home bipap.   Consultants:  none  Procedures:  none  Antibiotics:  levaquin   Objective: BP 142/86 mmHg  Pulse 96  Temp(Src) 97.8 F (36.6 C) (Oral)  Resp 26  Ht 5\' 1"  (1.549 m)  Wt 59.875 kg (132 lb)  BMI 24.95 kg/m2  SpO2 98%  Intake/Output Summary (Last 24 hours) at 01/18/16 0901 Last data filed at 01/17/16 2255  Gross per 24 hour  Intake      0 ml  Output   1650 ml  Net  -1650 ml   Filed Weights   01/17/16 1210  Weight: 59.875 kg (132 lb)    Exam:   General:  NAD  Cardiovascular: sinus tachycardia,  Respiratory: very diminished overall, but currently no wheezing  Abdomen: Soft/ND/NT, positive BS  Musculoskeletal: No Edema  Neuro: aaox3  Data Reviewed: Basic Metabolic Panel:  Recent Labs Lab 01/17/16 1318 01/18/16 0304  NA 133* 138  K 2.9* 4.2  CL 87* 100*  CO2 34* 30  GLUCOSE 108* 142*  BUN 6 <5*  CREATININE 0.75 0.66  CALCIUM 8.7* 8.0*  MG  --  1.1*   Liver Function Tests:  Recent Labs Lab 01/17/16 1318 01/18/16 0304  AST 74* 51*  ALT 70* 61*  ALKPHOS 43 38  BILITOT 0.6 0.5  PROT 6.8 6.0*  ALBUMIN 3.9 3.4*    Recent Labs Lab 01/17/16 1318  LIPASE 34   No results for input(s): AMMONIA in the last 168 hours. CBC:  Recent Labs Lab 01/17/16 1318 01/18/16 0304  WBC 7.0 3.9*  NEUTROABS 4.8  --   HGB 11.5* 10.2*  HCT 35.8* 31.9*  MCV 88.8 89.1  PLT 314 289   Cardiac Enzymes:   No results for input(s): CKTOTAL, CKMB, CKMBINDEX, TROPONINI in the last 168 hours. BNP (last 3 results)  Recent Labs  12/04/15 2020  BNP 48.8    ProBNP (last 3 results) No results for input(s): PROBNP in the last 8760 hours.  CBG: No results for input(s): GLUCAP in the last 168 hours.  Recent Results (from the past 240 hour(s))    Blood Culture (routine x 2)     Status: None (Preliminary result)   Collection Time: 01/17/16  1:06 PM  Result Value Ref Range Status   Specimen Description BLOOD RIGHT FOREARM  Final   Special Requests IN PEDIATRIC BOTTLE 2CC  Final   Culture PENDING  Incomplete   Report Status PENDING  Incomplete  MRSA PCR Screening     Status: None   Collection Time: 01/17/16  6:25 PM  Result Value Ref Range Status   MRSA by PCR NEGATIVE NEGATIVE Final    Comment:        The GeneXpert MRSA Assay (FDA approved for NASAL specimens only), is one component of a comprehensive MRSA colonization surveillance program. It is not intended to diagnose MRSA infection nor to guide or monitor treatment for MRSA infections.      Studies: Dg Chest Port 1 View  01/17/2016  CLINICAL DATA:  Shortness of Breath, tachycardia EXAM: PORTABLE CHEST 1 VIEW COMPARISON:  12/22/2015 FINDINGS: Cardiomediastinal silhouette is unremarkable. No acute infiltrate or pleural effusion. No pulmonary edema. Stable calcified granuloma in right lower lobe. IMPRESSION: No active disease. Electronically Signed   By: Natasha Mead M.D.   On: 01/17/2016 12:58   US Abdomen Limited Ruq  01/17/2016  CLINICAL DATA:  Abnormal liver function tests. EXAM: US ABDOMEN LIMITED - RIGHT UPPER QUADRANT COMPARISON:  None. FINDINGS: There are mild study limitations due to inability of the patient to lie flat or control respirations. Gallbladder: Nonfasting exam. The gallbladder is not distended. No abnormalities are evident. Common bile duct: Diameter: Normal, 2.5 mm. Liver: There is diffusely increased echogenicity of the liver consistent with fatty infiltration. No focal liver lesion is evident. There is no intrahepatic bile duct dilatation. IMPRESSION: Increased hepatic parenchymal echogenicity consistent with fatty liver or other diffuse hepatocellular disease. No focal liver lesion. Unremarkable biliary system. Electronically Signed   By: Ellery Plunk  M.D.   On: 01/17/2016 19:40    Scheduled Meds: . budesonide (PULMICORT) nebulizer solution  0.25 mg Nebulization BID  . enoxaparin (LOVENOX) injection  40 mg Subcutaneous Q24H  . fluticasone  2 spray Each Nare Daily  . ipratropium-albuterol  3 mL Nebulization Q6H  . levofloxacin  750 mg Oral Q24H  . magnesium sulfate 1 - 4 g bolus IVPB  2 g Intravenous Once  . methylPREDNISolone (SOLU-MEDROL) injection  60 mg Intravenous Q6H  . pantoprazole  40 mg Oral Daily  . theophylline  200 mg Oral BID AC  . traZODone  50 mg Oral QHS    Continuous Infusions:    Time spent:  Mixtli Reno MD, PhD  [facility] Pager [phone]. If 7PM-7AM, please contact night-coverage at [email or web address], password [facility] 01/18/2016, 9:01 AM  LOS: 1 day

## 2016-01-18 NOTE — Care Management Note (Signed)
Case Management Note  Patient Details  Name: Wendy [name] MRN: [id number] Date of Birth: [birth year]  Subjective/Objective:        hypovolemia            Action/Plan:Date:  Jan 18, 2016 Chart reviewed for concurrent status and case management needs. Will continue to follow patient for changes and needs: Marcelle Smiling, BSN, [place], [place]   [phone]   Expected Discharge Date:   (unknown)               Expected Discharge Plan:  Home/Self Care  In-House Referral:     Discharge planning Services  CM Consult  Post Acute Care Choice:    Choice offered to:     DME Arranged:    DME Agency:     HH Arranged:    HH Agency:     Status of Service:  In process, will continue to follow  Medicare Important Message Given:    Date Medicare IM Given:    Medicare IM give by:    Date Additional Medicare IM Given:    Additional Medicare Important Message give by:     If discussed at Long Length of Stay Meetings, dates discussed:    Additional Comments:  Golda Acre, RN 01/18/2016, 11:25 AM

## 2016-01-18 NOTE — Progress Notes (Signed)
Patient up to bedside commode.

## 2016-01-18 NOTE — Progress Notes (Signed)
[facility]  Patient Status: Active (receiving services up to time of hospitalization)  [facility] is providing the following services: RN and PT  If patient discharges after hours, please call [phone].   [name] 01/18/2016, 2:07 PM

## 2016-01-18 NOTE — Progress Notes (Signed)
After pt given scheduled nebulizer treatment, she requested to go back on her nasal cannula (3Lpm).  Pt stated that she wears her home bipap between 3-4 hours each night.  RN notified.

## 2016-01-19 ENCOUNTER — Telehealth: Payer: Self-pay

## 2016-01-19 DIAGNOSIS — J120 Adenoviral pneumonia: Secondary | ICD-10-CM

## 2016-01-19 LAB — CBC
HCT: 30.6 % — ABNORMAL LOW (ref 36.0–46.0)
Hemoglobin: 9.8 g/dL — ABNORMAL LOW (ref 12.0–15.0)
MCH: 28.4 pg (ref 26.0–34.0)
MCHC: 32 g/dL (ref 30.0–36.0)
MCV: 88.7 fL (ref 78.0–100.0)
PLATELETS: 304 10*3/uL (ref 150–400)
RBC: 3.45 MIL/uL — ABNORMAL LOW (ref 3.87–5.11)
RDW: 13.7 % (ref 11.5–15.5)
WBC: 7.4 10*3/uL (ref 4.0–10.5)

## 2016-01-19 LAB — EXPECTORATED SPUTUM ASSESSMENT W REFEX TO RESP CULTURE

## 2016-01-19 LAB — URINE CULTURE

## 2016-01-19 LAB — HEPATITIS PANEL, ACUTE
HEP A IGM: NEGATIVE
Hep B C IgM: NEGATIVE
Hepatitis B Surface Ag: NEGATIVE

## 2016-01-19 LAB — COMPREHENSIVE METABOLIC PANEL
ALT: 50 U/L (ref 14–54)
ANION GAP: 9 (ref 5–15)
AST: 34 U/L (ref 15–41)
Albumin: 3.5 g/dL (ref 3.5–5.0)
Alkaline Phosphatase: 39 U/L (ref 38–126)
BUN: 7 mg/dL (ref 6–20)
CALCIUM: 8.3 mg/dL — AB (ref 8.9–10.3)
CHLORIDE: 92 mmol/L — AB (ref 101–111)
CO2: 32 mmol/L (ref 22–32)
Creatinine, Ser: 0.7 mg/dL (ref 0.44–1.00)
GFR calc non Af Amer: >60 mL/min (ref >60)
Glucose, Bld: 106 mg/dL — ABNORMAL HIGH (ref 65–99)
POTASSIUM: 4 mmol/L (ref 3.5–5.1)
SODIUM: 133 mmol/L — AB (ref 135–145)
Total Bilirubin: 0.5 mg/dL (ref 0.3–1.2)
Total Protein: 6.3 g/dL — ABNORMAL LOW (ref 6.5–8.1)

## 2016-01-19 LAB — T4, FREE: Free T4: 0.99 ng/dL (ref 0.61–1.12)

## 2016-01-19 LAB — TSH: TSH: 0.146 u[IU]/mL — AB (ref 0.350–4.500)

## 2016-01-19 LAB — MAGNESIUM: Magnesium: 1.8 mg/dL (ref 1.7–2.4)

## 2016-01-19 LAB — THEOPHYLLINE LEVEL: THEOPHYLLINE LVL: 13 ug/mL (ref 10.0–20.0)

## 2016-01-19 MED ORDER — MORPHINE SULFATE (CONCENTRATE) 10 MG /0.5 ML PO SOLN: ORAL | Status: DC

## 2016-01-19 MED ORDER — LEVALBUTEROL HCL 1.25 MG/0.5ML IN NEBU: 1.2500 mg | INHALATION_SOLUTION | Freq: Three times a day (TID) | RESPIRATORY_TRACT | Status: DC

## 2016-01-19 MED ORDER — GUAIFENESIN ER 600 MG PO TB12: 600.0000 mg | ORAL_TABLET | Freq: Two times a day (BID) | ORAL | Status: DC

## 2016-01-19 MED ORDER — PREDNISONE 10 MG PO TABS: ORAL_TABLET | ORAL | Status: DC

## 2016-01-19 MED ORDER — SULFAMETHOXAZOLE-TRIMETHOPRIM 800-160 MG PO TABS: 1.0000 | ORAL_TABLET | Freq: Two times a day (BID) | ORAL | Status: DC

## 2016-01-19 MED ORDER — IPRATROPIUM BROMIDE 0.02 % IN SOLN: 0.5000 mg | Freq: Three times a day (TID) | RESPIRATORY_TRACT | Status: DC

## 2016-01-19 NOTE — Progress Notes (Signed)
Pt currently off BIPAP and on 3 LPM [place]. No distress noted at this tie.

## 2016-01-19 NOTE — Telephone Encounter (Signed)
Patient known to [employer] and [employer]. This Case Manager attempted to see patient while hospitalized to discuss the follow-up, medical management provided at the [facility] at [facility]; however, patient discharged prior to this Case Manager being able to see her. .This Case Manager spoke with inpatient Case Manager to determine if home health resumption of care [facility] RN/PT) set-up prior to discharge, and she was uncertain.   Placed call to patient to provide information about the [facility]itional Care Clinic. Patient indicated she planned to continue seeing Dr. Armen Pickup and has an appointment scheduled for 01/23/16 at 1000 with Dr. Armen Pickup. Patient indicated she did not receive home nebulizer prior to discharge. This Case Manager spoke with Myrtie Neither at [facility] and was informed that [facility] did receive order for resumption of home health services, and start of care scheduled for 01/20/16. However, [facility] did not receive order for home nebulizer. She requested order to be faxed to [phone], Attn: Marylene Land.  Order for nebulizer faxed as requested. No additional needs/concerns identified.

## 2016-01-19 NOTE — Progress Notes (Signed)
DC instructions reviewed with the patient, all questions and concerns were addressed. F/u appt are in place, pt is alert and oriented times, VSS, no apparent distress or discomfort at this time, no c/o pain, skin intact, her mother is here is transport home and has her home 02 tank with her. All other personal belongings packed up by the pt. Pt DC home.

## 2016-01-19 NOTE — Discharge Summary (Signed)
Discharge Summary  Wendy [name] ZDG:[id number] DOB: 1961/08/21  PCP: Lora Paula, MD  Admit date: 01/17/2016 Discharge date: 01/19/2016  Time spent: >67mins  Recommendations for Outpatient Follow-up:  1. F/u with PMD within a week  for hospital discharge follow up, repeat cbc/bmp at follow up, pmd to continue monitor tsh, f/u free t3 and free t4 level, result pending at discharge. 2. F/u with pulmonologist Dr Maple Hudson within a week 3. Resume home health  Discharge Diagnoses:  Active Hospital Problems   Diagnosis Date Noted  . Acute on chronic respiratory failure with hypoxemia (HCC) 01/17/2016  . COPD exacerbation (HCC) 01/17/2016  . Transaminasemia 01/17/2016  . Hypokalemia 01/17/2016  . Nausea and vomiting 01/17/2016    Resolved Hospital Problems   Diagnosis Date Noted Date Resolved  No resolved problems to display.    Discharge Condition: stable  Diet recommendation: regular diet  Filed Weights   01/17/16 1210  Weight: 59.875 kg (132 lb)    History of present illness:  Wendy [name] is a 55 y.o. female with medical history of COPD, chronic respiratory failure on 3 L nasal cannula presented with one-week history of worsening shortness breath and productive cough with yellow sputum. The patient contacted her pulmonary office whom prescribed doxycycline on 01/11/2016, and increased her prednisone to 20 mg daily. After 3-4 days of doxycycline, the patient began having nausea and vomiting, she stopped taking the doxycycline. However, she continued to take the prednisone that she finished yesterday. Her shortness of breath did not improve. She went to see her pulmonary physician on 01/17/2016 and was sent to the emergency department for further evaluation due to her respiratory distress. The patient has been complaining of low-grade fevers up to 100.41F. She denies any headache, neck pain, rashes, diarrhea, abdominal pain, dysuria, hematuria. The patient lives with her son  and boyfriend. She states that her son had pneumonia approximately one week prior to this admission and required admission to the hospital. She states that he was just recently discharged from the hospital 3 days prior. She feels like this may have been the etiology of her decompensation.  In the emergency department, the patient was given an hour-long nebulizer treatment with intravenous levofloxacin and Solu-Medrol with some improvement of her respiratory status. However the patient continued to have shortness of breath and wheezing. The patient was given levofloxacin and 2 L normal saline. Potassium was 2.9 and sodium 133. CBC was unremarkable. AST was 74, ALT 70, alkaline phosphatase 43, total bilirubin 0.6. Chest x-ray was negative for any infiltrates. Lactic acid 1.7. EKG showed sinus tachycardia versus atrial tachycardia with nonspecific ST changes.  Hospital Course:  Active Problems:   Acute on chronic respiratory failure with hypoxemia (HCC)   COPD exacerbation (HCC)   Transaminasemia   Hypokalemia   Nausea and vomiting  Acute on chronic respiratory failure with hypoxia on home o2 3liters and on bipap at night -Secondary to COPD exacerbation -treated with intravenous Solu-Medrol/nebs( changed to xopenex/atrovent due to tachycardia)/muxinex/02 supplement -multiple abx allergy and intolerance, Continue levofloxacin for now, repeat ekg QTc improved -cxr no acute findings, respiratory viral panel + adenovirus -improved,  -patient requested nebulizer prescription and oral morphin solution for sob, reported she was given this in the past with great result for sob.  (total of 5cc oral morphin solution prescribed) -patient is to have sleep study for bipap use on staturday  Tachycardia, atrial tachycardia?  -Likely due to acute medical condition -The patient denies any chest pain or dizziness -  suppressed TSH, free t3 adn free t4 pending,  -stop theophyline, change nebs to xopenex, keep  mag>2, k >4   Prolonged QTc, QTc 531 on admission,  -repeat EKG QTc 477 on 5/3 while on levofloxacin, keep k >4, mag >2. -d/c trazadone  Hypokalemia/hypomagnesemia -Replaced k and mag  Transaminasemia with nausea and vomiting -Right upper quadrant ultrasound with fatty liver, lipase wnl, hepatitis panel negative, patient also reported alcohol use in the past -Nausea and vomiting likely due to doxycycline as the patient has had this reaction in the past -No vomiting since arrival to the ED -UA neg for pyuria -resolved   Anxiety -continue home dose xanax   Immunosuppressed states, patient has been on humira for 9 months for psoriasis, this was stopped two months ago due to pna, she also has been on daily presdnisone 10-20mg  for almost a year.  Code Status: full, confirmed with the patient  Family Communication: patient   Disposition Plan: home.   Consultants:  none  Procedures:  none  Antibiotics:  levaquin in the hospital   Discharge Exam: BP 103/58 mmHg  Pulse 91  Temp(Src) 98.9 F (37.2 C) (Oral)  Resp 16  Ht 5\' 1"  (1.549 m)  Wt 59.875 kg (132 lb)  BMI 24.95 kg/m2  SpO2 95%  General: * Cardiovascular: * Respiratory: *  Discharge Instructions You were cared for by a hospitalist during your hospital stay. If you have any questions about your discharge medications or the care you received while you were in the hospital after you are discharged, you can call the unit and asked to speak with the hospitalist on call if the hospitalist that took care of you is not available. Once you are discharged, your primary care physician will handle any further medical issues. Please note that NO REFILLS for any discharge medications will be authorized once you are discharged, as it is imperative that you return to your primary care physician (or establish a relationship with a primary care physician if you do not have one) for your aftercare needs so that they can  reassess your need for medications and monitor your lab values.      Discharge Instructions    DME Nebulizer machine    Complete by:  As directed      Diet - low sodium heart healthy    Complete by:  As directed      Face-to-face encounter (required for Medicare/Medicaid patients)    Complete by:  As directed   I Sharee Sturdy certify that this patient is under my care and that I, or a nurse practitioner or physician's assistant working with me, had a face-to-face encounter that meets the physician face-to-face encounter requirements with this patient on 01/19/2016. The encounter with the patient was in whole, or in part for the following medical condition(s) which is the primary reason for home health care (List medical condition): FTT  The encounter with the patient was in whole, or in part, for the following medical condition, which is the primary reason for home health care:  FTT  I certify that, based on my findings, the following services are medically necessary home health services:   Nursing Physical therapy    Reason for Medically Necessary Home Health Services:  Skilled Nursing- Change/Decline in Patient Status  My clinical findings support the need for the above services:  Shortness of breath with activity  Further, I certify that my clinical findings support that this patient is homebound due to:  Shortness of Breath with  activity     Home Health    Complete by:  As directed   To provide the following care/treatments:   PT RN Respiratory Care       Increase activity slowly    Complete by:  As directed             Medication List    STOP taking these medications        acetaminophen-codeine 300-30 MG tablet  Commonly known as:  TYLENOL #3     Adalimumab 40 MG/0.8ML Pnkt  Commonly known as:  HUMIRA PEN-CROHNS STARTER     doxycycline 100 MG tablet  Commonly known as:  VIBRA-TABS     theophylline 400 MG 24 hr tablet  Commonly known as:  UNIPHYL     traZODone 50 MG  tablet  Commonly known as:  DESYREL      TAKE these medications        ADVAIR DISKUS 500-50 MCG/DOSE Aepb  Generic drug:  Fluticasone-Salmeterol  INHALE 1 PUFF INTO THE LUNGS 2 TIMES DAILY     albuterol 108 (90 Base) MCG/ACT inhaler  Commonly known as:  PROAIR HFA  2 puffs every 4 hours as needed- rescue     albuterol (2.5 MG/3ML) 0.083% nebulizer solution  Commonly known as:  PROVENTIL  Take 3 mLs (2.5 mg total) by nebulization every 6 (six) hours as needed for wheezing or shortness of breath.     ALPRAZolam 1 MG tablet  Commonly known as:  XANAX  1 tab, three times daily as needed     chlorpheniramine-HYDROcodone 10-8 MG/5ML Suer  Commonly known as:  TUSSIONEX  Take 5 mLs by mouth every 12 (twelve) hours.     fluticasone 50 MCG/ACT nasal spray  Commonly known as:  FLONASE  PLACE 2 SPRAYS INTO BOTH NOSTRILS DAILY.     guaiFENesin 600 MG 12 hr tablet  Commonly known as:  MUCINEX  Take 1 tablet (600 mg total) by mouth 2 (two) times daily.     ibuprofen 200 MG tablet  Commonly known as:  ADVIL,MOTRIN  Take 200 mg by mouth every 6 (six) hours as needed for fever, headache, mild pain, moderate pain or cramping.     ipratropium 0.02 % nebulizer solution  Commonly known as:  ATROVENT  Take 2.5 mLs (0.5 mg total) by nebulization 3 (three) times daily.     levalbuterol 1.25 MG/0.5ML nebulizer solution  Commonly known as:  XOPENEX  Take 1.25 mg by nebulization 3 (three) times daily.     morphine CONCENTRATE 10 mg / 0.5 ml concentrated solution  2.5mg  po q12hr prn for sob     pantoprazole 20 MG tablet  Commonly known as:  PROTONIX  TAKE 1 TABLET (20 MG TOTAL) BY MOUTH DAILY BEFORE SUPPER.     predniSONE 10 MG tablet  Commonly known as:  DELTASONE  Label  & dispense according to the schedule below. 6 Pills PO for 3 days then, 5 Pills PO for 3 days, 4 Pills PO for 3 days, 3 Pills PO for 3 days, 2 Pills PO for 3 days then back to baseline prednisone dose.     SPIRIVA  HANDIHALER 18 MCG inhalation capsule  Generic drug:  tiotropium  PLACE 1 CAPSULE (18 MCG TOTAL) INTO INHALER AND INHALE DAILY.     sulfamethoxazole-trimethoprim 800-160 MG tablet  Commonly known as:  BACTRIM DS,SEPTRA DS  Take 1 tablet by mouth 2 (two) times daily.       Allergies  Allergen Reactions  .  Augmentin [Amoxicillin-Pot Clavulanate] Nausea Only    Has patient had a PCN reaction causing immediate rash, facial/tongue/throat swelling, SOB or lightheadedness with hypotension: No Has patient had a PCN reaction causing severe rash involving mucus membranes or skin necrosis: No Has patient had a PCN reaction that required hospitalization No Has patient had a PCN reaction occurring within the last 10 years: Yes If all of the above answers are "NO", then may proceed with Cephalosporin use.   . Cefdinir Other (See Comments)    Aches, "heart fluttering"  . Doxycycline Nausea And Vomiting  . Other Nausea Only    PAIN MEDICATIONS-nausea  . Codeine Nausea Only  . Fexofenadine Nausea Only  . Hydralazine     Side effect Felt woozy   Follow-up Information    Follow up with Lora Paula, MD On 01/23/2016.   Specialty:  Family Medicine   Why:  hospital discharge follow up, repeat cbc/bmp at follow up. pmd to continue monitor thyroid function.   [address] [place] [address] [phone]       Follow up with Waymon Budge, MD In 1 week.   Specialty:  Pulmonary Disease   Why:  copd management   Contact information:   [address] [place] [address] [phone]        The results of significant diagnostics from this hospitalization (including imaging, microbiology, ancillary and laboratory) are listed below for reference.    Significant Diagnostic Studies: Dg Chest 2 View  12/22/2015  CLINICAL DATA:  Cough. EXAM: CHEST  2 VIEW COMPARISON:  December 11, 2015. FINDINGS: Stable cardiomediastinal silhouette. No pneumothorax or pleural  effusion is noted. Both lungs are clear. The visualized skeletal structures are unremarkable. IMPRESSION: No active cardiopulmonary disease. Electronically Signed   By: Lupita Raider, M.D.   On: 12/22/2015 16:13   Dg Chest Port 1 View  01/17/2016  CLINICAL DATA:  Shortness of Breath, tachycardia EXAM: PORTABLE CHEST 1 VIEW COMPARISON:  12/22/2015 FINDINGS: Cardiomediastinal silhouette is unremarkable. No acute infiltrate or pleural effusion. No pulmonary edema. Stable calcified granuloma in right lower lobe. IMPRESSION: No active disease. Electronically Signed   By: Natasha Mead M.D.   On: 01/17/2016 12:58   US Abdomen Limited Ruq  01/17/2016  CLINICAL DATA:  Abnormal liver function tests. EXAM: US ABDOMEN LIMITED - RIGHT UPPER QUADRANT COMPARISON:  None. FINDINGS: There are mild study limitations due to inability of the patient to lie flat or control respirations. Gallbladder: Nonfasting exam. The gallbladder is not distended. No abnormalities are evident. Common bile duct: Diameter: Normal, 2.5 mm. Liver: There is diffusely increased echogenicity of the liver consistent with fatty infiltration. No focal liver lesion is evident. There is no intrahepatic bile duct dilatation. IMPRESSION: Increased hepatic parenchymal echogenicity consistent with fatty liver or other diffuse hepatocellular disease. No focal liver lesion. Unremarkable biliary system. Electronically Signed   By: Ellery Plunk M.D.   On: 01/17/2016 19:40    Microbiology: Recent Results (from the past 240 hour(s))  Blood Culture (routine x 2)     Status: None (Preliminary result)   Collection Time: 01/17/16  1:06 PM  Result Value Ref Range Status   Specimen Description BLOOD RIGHT FOREARM  Final   Special Requests IN PEDIATRIC BOTTLE 2CC  Final   Culture   Final    NO GROWTH < 24 HOURS Performed at [facility]    Report Status PENDING  Incomplete  Blood Culture (routine x 2)  Status: None (Preliminary result)    Collection Time: 01/17/16  1:12 PM  Result Value Ref Range Status   Specimen Description BLOOD LEFT ANTECUBITAL  Final   Special Requests BOTTLES DRAWN AEROBIC AND ANAEROBIC 5CC EACH  Final   Culture   Final    NO GROWTH < 24 HOURS Performed at [facility]    Report Status PENDING  Incomplete  Urine culture     Status: None (Preliminary result)   Collection Time: 01/17/16  3:08 PM  Result Value Ref Range Status   Specimen Description URINE, RANDOM  Final   Special Requests NONE  Final   Culture   Final    TOO YOUNG TO READ Performed at [facility]    Report Status PENDING  Incomplete  MRSA PCR Screening     Status: None   Collection Time: 01/17/16  6:25 PM  Result Value Ref Range Status   MRSA by PCR NEGATIVE NEGATIVE Final    Comment:        The GeneXpert MRSA Assay (FDA approved for NASAL specimens only), is one component of a comprehensive MRSA colonization surveillance program. It is not intended to diagnose MRSA infection nor to guide or monitor treatment for MRSA infections.   Respiratory Panel by PCR     Status: Abnormal   Collection Time: 01/17/16 10:22 PM  Result Value Ref Range Status   Adenovirus DETECTED (A) NOT DETECTED Final   Coronavirus 229E NOT DETECTED NOT DETECTED Final   Coronavirus HKU1 NOT DETECTED NOT DETECTED Final   Coronavirus NL63 NOT DETECTED NOT DETECTED Final   Coronavirus OC43 NOT DETECTED NOT DETECTED Final   Metapneumovirus NOT DETECTED NOT DETECTED Final   Rhinovirus / Enterovirus NOT DETECTED NOT DETECTED Final   Influenza A NOT DETECTED NOT DETECTED Final   Influenza A H1 NOT DETECTED NOT DETECTED Final   Influenza A H1 2009 NOT DETECTED NOT DETECTED Final   Influenza A H3 NOT DETECTED NOT DETECTED Final   Influenza B NOT DETECTED NOT DETECTED Final   Parainfluenza Virus 1 NOT DETECTED NOT DETECTED Final   Parainfluenza Virus 2 NOT DETECTED NOT DETECTED Final   Parainfluenza Virus 3 NOT DETECTED NOT DETECTED Final     Parainfluenza Virus 4 NOT DETECTED NOT DETECTED Final   Respiratory Syncytial Virus NOT DETECTED NOT DETECTED Final   Bordetella pertussis NOT DETECTED NOT DETECTED Final   Chlamydophila pneumoniae NOT DETECTED NOT DETECTED Final   Mycoplasma pneumoniae NOT DETECTED NOT DETECTED Final  Culture, expectorated sputum-assessment     Status: None   Collection Time: 01/18/16 11:00 AM  Result Value Ref Range Status   Specimen Description SPUTUM  Final   Special Requests NONE  Final   Sputum evaluation   Final    MICROSCOPIC FINDINGS SUGGEST THAT THIS SPECIMEN IS NOT REPRESENTATIVE OF LOWER RESPIRATORY SECRETIONS. PLEASE RECOLLECT.   Report Status 01/18/2016 FINAL  Final     Labs: Basic Metabolic Panel:  Recent Labs Lab 01/17/16 1318 01/18/16 0304 01/18/16 1507 01/19/16 0310  NA 133* 138  --  133*  K 2.9* 4.2  --  4.0  CL 87* 100*  --  92*  CO2 34* 30  --  32  GLUCOSE 108* 142*  --  106*  BUN 6 <5*  --  7  CREATININE 0.75 0.66  --  0.70  CALCIUM 8.7* 8.0*  --  8.3*  MG  --  1.1* 2.0 1.8   Liver Function Tests:  Recent Labs Lab  01/17/16 1318 01/18/16 0304 01/19/16 0310  AST 74* 51* 34  ALT 70* 61* 50  ALKPHOS 43 38 39  BILITOT 0.6 0.5 0.5  PROT 6.8 6.0* 6.3*  ALBUMIN 3.9 3.4* 3.5    Recent Labs Lab 01/17/16 1318  LIPASE 34   No results for input(s): AMMONIA in the last 168 hours. CBC:  Recent Labs Lab 01/17/16 1318 01/18/16 0304 01/19/16 0310  WBC 7.0 3.9* 7.4  NEUTROABS 4.8  --   --   HGB 11.5* 10.2* 9.8*  HCT 35.8* 31.9* 30.6*  MCV 88.8 89.1 88.7  PLT 314 289 304   Cardiac Enzymes: No results for input(s): CKTOTAL, CKMB, CKMBINDEX, TROPONINI in the last 168 hours. BNP: BNP (last 3 results)  Recent Labs  12/04/15 2020  BNP 48.8    ProBNP (last 3 results) No results for input(s): PROBNP in the last 8760 hours.  CBG:  Recent Labs Lab 01/18/16 1234  GLUCAP 173*       Signed:  Elliyah Liszewski MD, PhD  [facility] 01/19/2016, 10:18  AM

## 2016-01-20 DIAGNOSIS — J189 Pneumonia, unspecified organism: Secondary | ICD-10-CM | POA: Diagnosis not present

## 2016-01-20 DIAGNOSIS — I509 Heart failure, unspecified: Secondary | ICD-10-CM | POA: Diagnosis not present

## 2016-01-20 DIAGNOSIS — Z9981 Dependence on supplemental oxygen: Secondary | ICD-10-CM | POA: Diagnosis not present

## 2016-01-20 DIAGNOSIS — J441 Chronic obstructive pulmonary disease with (acute) exacerbation: Secondary | ICD-10-CM | POA: Diagnosis not present

## 2016-01-20 LAB — T3, FREE: T3, Free: 2.4 pg/mL (ref 2.0–4.4)

## 2016-01-21 ENCOUNTER — Ambulatory Visit: Payer: Medicare Other | Attending: Internal Medicine

## 2016-01-21 DIAGNOSIS — Z87891 Personal history of nicotine dependence: Secondary | ICD-10-CM | POA: Insufficient documentation

## 2016-01-21 DIAGNOSIS — Z9981 Dependence on supplemental oxygen: Secondary | ICD-10-CM | POA: Diagnosis not present

## 2016-01-21 DIAGNOSIS — Z79899 Other long term (current) drug therapy: Secondary | ICD-10-CM | POA: Insufficient documentation

## 2016-01-21 DIAGNOSIS — J189 Pneumonia, unspecified organism: Secondary | ICD-10-CM | POA: Diagnosis not present

## 2016-01-21 DIAGNOSIS — D638 Anemia in other chronic diseases classified elsewhere: Secondary | ICD-10-CM | POA: Insufficient documentation

## 2016-01-21 DIAGNOSIS — J441 Chronic obstructive pulmonary disease with (acute) exacerbation: Secondary | ICD-10-CM | POA: Insufficient documentation

## 2016-01-21 DIAGNOSIS — J449 Chronic obstructive pulmonary disease, unspecified: Secondary | ICD-10-CM | POA: Insufficient documentation

## 2016-01-21 DIAGNOSIS — R112 Nausea with vomiting, unspecified: Secondary | ICD-10-CM | POA: Insufficient documentation

## 2016-01-21 DIAGNOSIS — R000 Tachycardia, unspecified: Secondary | ICD-10-CM | POA: Insufficient documentation

## 2016-01-21 DIAGNOSIS — I509 Heart failure, unspecified: Secondary | ICD-10-CM | POA: Diagnosis not present

## 2016-01-22 DIAGNOSIS — I509 Heart failure, unspecified: Secondary | ICD-10-CM | POA: Diagnosis not present

## 2016-01-22 DIAGNOSIS — J189 Pneumonia, unspecified organism: Secondary | ICD-10-CM | POA: Diagnosis not present

## 2016-01-22 DIAGNOSIS — Z9981 Dependence on supplemental oxygen: Secondary | ICD-10-CM | POA: Diagnosis not present

## 2016-01-22 DIAGNOSIS — J441 Chronic obstructive pulmonary disease with (acute) exacerbation: Secondary | ICD-10-CM | POA: Diagnosis not present

## 2016-01-22 LAB — CULTURE, BLOOD (ROUTINE X 2)
CULTURE: NO GROWTH
CULTURE: NO GROWTH

## 2016-01-23 ENCOUNTER — Encounter: Payer: Self-pay | Admitting: Family Medicine

## 2016-01-23 ENCOUNTER — Ambulatory Visit (HOSPITAL_BASED_OUTPATIENT_CLINIC_OR_DEPARTMENT_OTHER): Payer: Medicare Other | Admitting: Family Medicine

## 2016-01-23 ENCOUNTER — Telehealth: Payer: Self-pay | Admitting: Family Medicine

## 2016-01-23 VITALS — BP 121/78 | HR 121 | Temp 98.4°F | Resp 16 | Ht 61.0 in | Wt 132.0 lb

## 2016-01-23 DIAGNOSIS — J449 Chronic obstructive pulmonary disease, unspecified: Secondary | ICD-10-CM

## 2016-01-23 DIAGNOSIS — J441 Chronic obstructive pulmonary disease with (acute) exacerbation: Secondary | ICD-10-CM | POA: Diagnosis not present

## 2016-01-23 DIAGNOSIS — R112 Nausea with vomiting, unspecified: Secondary | ICD-10-CM | POA: Diagnosis not present

## 2016-01-23 DIAGNOSIS — D649 Anemia, unspecified: Secondary | ICD-10-CM | POA: Insufficient documentation

## 2016-01-23 DIAGNOSIS — D638 Anemia in other chronic diseases classified elsewhere: Secondary | ICD-10-CM | POA: Diagnosis not present

## 2016-01-23 DIAGNOSIS — Z87891 Personal history of nicotine dependence: Secondary | ICD-10-CM | POA: Diagnosis not present

## 2016-01-23 DIAGNOSIS — R000 Tachycardia, unspecified: Secondary | ICD-10-CM

## 2016-01-23 DIAGNOSIS — M6281 Muscle weakness (generalized): Secondary | ICD-10-CM | POA: Diagnosis not present

## 2016-01-23 DIAGNOSIS — I509 Heart failure, unspecified: Secondary | ICD-10-CM | POA: Diagnosis not present

## 2016-01-23 DIAGNOSIS — J189 Pneumonia, unspecified organism: Secondary | ICD-10-CM | POA: Diagnosis not present

## 2016-01-23 DIAGNOSIS — R740 Nonspecific elevation of levels of transaminase and lactic acid dehydrogenase [LDH]: Secondary | ICD-10-CM | POA: Diagnosis not present

## 2016-01-23 DIAGNOSIS — Z9981 Dependence on supplemental oxygen: Secondary | ICD-10-CM | POA: Diagnosis not present

## 2016-01-23 DIAGNOSIS — Z79899 Other long term (current) drug therapy: Secondary | ICD-10-CM | POA: Diagnosis not present

## 2016-01-23 MED ORDER — MORPHINE SULFATE (CONCENTRATE) 10 MG /0.5 ML PO SOLN: ORAL | Status: DC

## 2016-01-23 MED ORDER — LEVALBUTEROL TARTRATE 45 MCG/ACT IN AERO: 2.0000 | INHALATION_SPRAY | RESPIRATORY_TRACT | Status: DC | PRN

## 2016-01-23 NOTE — Progress Notes (Signed)
HFU COPD Medication management Rx Morphine  Elevated HR Pain scale #3 Former tobacco user  No suicidal thoughts in the past two weeks

## 2016-01-23 NOTE — Telephone Encounter (Signed)
Call received from [facility] with [facility] with questions regarding the order for morphine sulfate requesting to speak to the patient's MD.  Dr Armen Pickup notified.  As requested by Dr Georga Bora copy of the morphine order was faxed to Havery Moros at # [phone] and the hard copy was mailed to Ms Kathlen Brunswick at the address noted.

## 2016-01-23 NOTE — Patient Instructions (Addendum)
Wendy [name] was seen today for copd and hospitalization follow-up.  Diagnoses and all orders for this visit:  Anemia in other chronic diseases classified elsewhere -     Cancel: CBC -     CBC; Future  Nausea and vomiting, vomiting of unspecified type -     Cancel: COMPLETE METABOLIC PANEL WITH GFR -     COMPLETE METABOLIC PANEL WITH GFR; Future  COPD, severe (HCC) -     Morphine Sulfate (MORPHINE CONCENTRATE) 10 mg / 0.5 ml concentrated solution; 2.5mg  po q12hr prn for sob -     levalbuterol (XOPENEX HFA) 45 MCG/ACT inhaler; Inhale 2 puffs into the lungs every 4 (four) hours as needed for wheezing or shortness of breath.   STOP all albuterol due to albuterol Use xopenex neb or inhaler only.  I will complete prior authorization for xopenex inhaler   F/u in 3-4 weeks for tachycardia   Dr. Armen Pickup

## 2016-01-23 NOTE — Assessment & Plan Note (Signed)
A: tachycardia . Suspect drug induced, possible anemia component  P: Dc all albuterol  Change to xopenex CBC today  Plan for low dose oral cardioselective beta blocker at f/u prn HR > 110

## 2016-01-23 NOTE — Assessment & Plan Note (Signed)
COPD and chronic respiratory failure with recent exacerbation   Keep pulm follow up Continue prednisone taper Finish bactrim, last dose today Refilled morphine solution to use prn air hunger

## 2016-01-23 NOTE — Telephone Encounter (Signed)
[facility] Patient's morphine sulfate smallest dispense option is 30 ml, I ordered 15 ml   Need hard copy of change since it is a controlled. Patient has difficulty with transportation   Plan: Fax copy to  Attn Havery Moros Fax [phone]  Mail Rx to  Address Attn Havery Moros [address] [place] [address]

## 2016-01-23 NOTE — Progress Notes (Signed)
Subjective:  Patient ID: Wendy [name], female    DOB: 1961/05/02  Age: 55 y.o. MRN: [id number]  CC: COPD and Hospitalization Follow-up   HPI TAFFANY HEISER presents for   1. HFU COPD: patient hospitalized from 11/22/15-11/30/15 and again from 12/04/15-12/15/15, and again from 01/17/16-01/19/16 with COPD exacerbations. She also had nausea and vomiting during her last admission that resolved by time of admission and was attributed to doxycyline. She is followed closely by her pulmonologist as well as [facility]. She has home health. She is on chronic O2 at 3 L via [place] during the day. She uses BiPAP at night. She has SOB, CP, palpitations, dizziness, lightheadedness. No nausea or emesis. She request refill for morphine.    2. Tachycardia: she has HR up to 190 at home. She reports higher HR in the AM after using BiPAP. She admits to some dizziness and lightheadedness. She is using albuterol inhaler at home and xopenex neb solution. Has has anemia. Her EKGs in the hospital were all sinus rhythm. She reports being treated with a dose of IV beta blocker in the hospital and immediately experiencing worsening SOB and fatigue.   Social History  Substance Use Topics  . Smoking status: Former Smoker -- 1.00 packs/day for 40 years    Types: Cigarettes    Start date: 11/10/1970    Quit date: 09/17/2013  . Smokeless tobacco: Never Used  . Alcohol Use: No     Comment: 5 cans of beer daily  " 06/2014 drinks occasional ".  3s/2/16 No longer drink    Outpatient Prescriptions Prior to Visit  Medication Sig Dispense Refill  . ADVAIR DISKUS 500-50 MCG/DOSE AEPB INHALE 1 PUFF INTO THE LUNGS 2 TIMES DAILY 60 each 3  . albuterol (PROAIR HFA) 108 (90 BASE) MCG/ACT inhaler 2 puffs every 4 hours as needed- rescue (Patient taking differently: Inhale 2 puffs into the lungs every 4 (four) hours as needed for wheezing. ) 2 Inhaler prn  . albuterol (PROVENTIL) (2.5 MG/3ML) 0.083% nebulizer solution Take 3 mLs (2.5  mg total) by nebulization every 6 (six) hours as needed for wheezing or shortness of breath. 150 mL 5  . ALPRAZolam (XANAX) 1 MG tablet 1 tab, three times daily as needed (Patient taking differently: Take 1 mg by mouth 3 (three) times daily as needed for anxiety. 1 tab, three times daily as needed) 90 tablet 5  . chlorpheniramine-HYDROcodone (TUSSIONEX) 10-8 MG/5ML SUER Take 5 mLs by mouth every 12 (twelve) hours. (Patient not taking: Reported on 01/17/2016) 140 mL 0  . fluticasone (FLONASE) 50 MCG/ACT nasal spray PLACE 2 SPRAYS INTO BOTH NOSTRILS DAILY. 16 g 2  . guaiFENesin (MUCINEX) 600 MG 12 hr tablet Take 1 tablet (600 mg total) by mouth 2 (two) times daily. 30 tablet 0  . ibuprofen (ADVIL,MOTRIN) 200 MG tablet Take 200 mg by mouth every 6 (six) hours as needed for fever, headache, mild pain, moderate pain or cramping.    Marland Kitchen ipratropium (ATROVENT) 0.02 % nebulizer solution Take 2.5 mLs (0.5 mg total) by nebulization 3 (three) times daily. 75 mL 12  . levalbuterol (XOPENEX) 1.25 MG/0.5ML nebulizer solution Take 1.25 mg by nebulization 3 (three) times daily. 1 each 12  . Morphine Sulfate (MORPHINE CONCENTRATE) 10 mg / 0.5 ml concentrated solution 2.5mg  po q12hr prn for sob 5 mL 0  . pantoprazole (PROTONIX) 20 MG tablet TAKE 1 TABLET (20 MG TOTAL) BY MOUTH DAILY BEFORE SUPPER. 30 tablet 5  . predniSONE (DELTASONE) 10  MG tablet Label  & dispense according to the schedule below. 6 Pills PO for 3 days then, 5 Pills PO for 3 days, 4 Pills PO for 3 days, 3 Pills PO for 3 days, 2 Pills PO for 3 days then back to baseline prednisone dose. 60 tablet 0  . SPIRIVA HANDIHALER 18 MCG inhalation capsule PLACE 1 CAPSULE (18 MCG TOTAL) INTO INHALER AND INHALE DAILY. 30 capsule 4  . sulfamethoxazole-trimethoprim (BACTRIM DS,SEPTRA DS) 800-160 MG tablet Take 1 tablet by mouth 2 (two) times daily. 6 tablet 0   No facility-administered medications prior to visit.    ROS Review of Systems  Constitutional: Negative for  fever and chills.  Eyes: Negative for visual disturbance.  Respiratory: Negative for shortness of breath.   Cardiovascular: Positive for palpitations. Negative for chest pain.  Gastrointestinal: Negative for abdominal pain and blood in stool.  Musculoskeletal: Negative for back pain and arthralgias.  Skin: Negative for rash.  Allergic/Immunologic: Negative for immunocompromised state.  Neurological: Positive for dizziness and light-headedness.  Hematological: Negative for adenopathy. Does not bruise/bleed easily.  Psychiatric/Behavioral: Negative for suicidal ideas and dysphoric mood.   Objective:  BP 121/78 mmHg  Pulse 121  Temp(Src) 98.4 F (36.9 C) (Oral)  Resp 16  Ht 5\' 1"  (1.549 m)  Wt 132 lb (59.875 kg)  BMI 24.95 kg/m2  SpO2 94%  BP/Weight 01/23/2016 01/19/2016 01/17/2016  Systolic BP 121 103 -  Diastolic BP 78 58 -  Wt. (Lbs) 132 - 132  BMI 24.95 - 24.95   Pulse Readings from Last 3 Encounters:  01/23/16 121  01/19/16 91  01/17/16 136   Physical Exam  Constitutional: She is oriented to person, place, and time. She appears well-developed and well-nourished. No distress.  HENT:  Head: Normocephalic and atraumatic.  Cardiovascular: Regular rhythm, normal heart sounds and intact distal pulses.  Tachycardia present.   Pulmonary/Chest: Accessory muscle usage present. No respiratory distress.  Musculoskeletal: She exhibits no edema.  Neurological: She is alert and oriented to person, place, and time.  Skin: Skin is warm and dry. No rash noted.  Psychiatric: She has a normal mood and affect.   Assessment & Plan:   There are no diagnoses linked to this encounter. Mikhaila was seen today for copd and hospitalization follow-up.  Diagnoses and all orders for this visit:  Anemia in other chronic diseases classified elsewhere -     Cancel: CBC -     CBC; Future  Nausea and vomiting, vomiting of unspecified type -     Cancel: COMPLETE METABOLIC PANEL WITH GFR -      COMPLETE METABOLIC PANEL WITH GFR; Future  COPD, severe (HCC) -     Morphine Sulfate (MORPHINE CONCENTRATE) 10 mg / 0.5 ml concentrated solution; 2.5mg  po q12hr prn for sob -     levalbuterol (XOPENEX HFA) 45 MCG/ACT inhaler; Inhale 2 puffs into the lungs every 4 (four) hours as needed for wheezing or shortness of breath.  COPD exacerbation (HCC)   Meds ordered this encounter  Medications  . Morphine Sulfate (MORPHINE CONCENTRATE) 10 mg / 0.5 ml concentrated solution    Sig: 2.5mg  po q12hr prn for sob    Dispense:  15 mL    Refill:  0  . levalbuterol (XOPENEX HFA) 45 MCG/ACT inhaler    Sig: Inhale 2 puffs into the lungs every 4 (four) hours as needed for wheezing or shortness of breath.    Dispense:  1 Inhaler    Refill:  12  Follow-up: No Follow-up on file.   Boykin Nearing MD

## 2016-01-24 ENCOUNTER — Encounter: Payer: Self-pay | Admitting: Adult Health

## 2016-01-24 ENCOUNTER — Ambulatory Visit (INDEPENDENT_AMBULATORY_CARE_PROVIDER_SITE_OTHER): Payer: Medicare Other | Admitting: Adult Health

## 2016-01-24 VITALS — BP 120/74 | Temp 97.7°F | Ht 61.0 in | Wt 132.0 lb

## 2016-01-24 DIAGNOSIS — J449 Chronic obstructive pulmonary disease, unspecified: Secondary | ICD-10-CM | POA: Diagnosis not present

## 2016-01-24 DIAGNOSIS — J9612 Chronic respiratory failure with hypercapnia: Secondary | ICD-10-CM

## 2016-01-24 DIAGNOSIS — Z9981 Dependence on supplemental oxygen: Secondary | ICD-10-CM | POA: Diagnosis not present

## 2016-01-24 DIAGNOSIS — J189 Pneumonia, unspecified organism: Secondary | ICD-10-CM | POA: Diagnosis not present

## 2016-01-24 DIAGNOSIS — J9611 Chronic respiratory failure with hypoxia: Secondary | ICD-10-CM

## 2016-01-24 DIAGNOSIS — I509 Heart failure, unspecified: Secondary | ICD-10-CM | POA: Diagnosis not present

## 2016-01-24 DIAGNOSIS — J441 Chronic obstructive pulmonary disease with (acute) exacerbation: Secondary | ICD-10-CM | POA: Diagnosis not present

## 2016-01-24 NOTE — Patient Instructions (Signed)
Taper prednisone to 10mg  daily and hold at this dose.  Continue on Advair and Spiriva , rinse after use.  Continue on Oxygen 3l/m .  Use Morphine As needed  For air hunger.  Continue on BIPAP At bedtime   We will check on sleep study  follow up Dr.  In 1 month as planned and As needed   Please contact office for sooner follow up if symptoms do not improve or worsen or seek emergency care

## 2016-01-24 NOTE — Assessment & Plan Note (Addendum)
Compensated on O2.  Cont on BIPAP At bedtime

## 2016-01-24 NOTE — Progress Notes (Signed)
Subjective:    Patient ID: Wendy [name], female    DOB: 11-Apr-1961, 55 y.o.   MRN: [id number]  HPI  55 year old female with history of end stage COPD (FEV1 19% predicted; 0.48L in 2014) on 3.5L home O2 followed by Dr. Maple Hudson CHF Grade 1 diastolic dysfunction. Now nocturnal BiPAP dependent .  Studies: 2D echo 3/20>>> EF 60-65%, PASP , grade 1 diastolic dysfunction  Has Psoriasis -previously on Humira   01/24/2016 Post hospital follow up  Pt returns for a post hospital follow up .  She was admitted for COPD exacerbation and acute on chronic hypoxic respiratory failure. She had been seen in the office with nausea, vomiting and tachycardia after starting doxycycline for a COPD flare. She was treated with IV antibiotics, steroids and nebulized bronchodilators. Thyroid panel did show a slightly low TSH with a normal T3 and 4. She remains on pred taper , on chronic steroids at 10mg  daily .  She has had 3 hospitalization for COPD exacerbatioin in last 3 months . Was on vent in March  for hypercarbic RF with AECOPD.  She remains very weak, gets winded very easily .  We discussed EOL .she does want short term intubation only in emergency . Absolutely no Trach. Short term vent use only and if no improvement -terminal wean. . She has a living will , and has prepared family . Using Morphine at home for air hunger . Has used it a few times since discharge. Does feel it has helped.  She is now on BIPAP At bedtime  For chronic hypercarbia RF , feels this is helping her breathing.  Gets in 4 hrs each night.  Suppose to have home sleep study.  She is on Advair and Spiriva .  On O2 3l/m .  Albuterol has been stopped, changed to xopenex.to help with tachycardia.  N/V resolved after Doxycycline was stopped.    Past Medical History  Diagnosis Date  . Asthma   . Seasonal allergies   . Poor dentition   . Alcoholism (HCC)   . COPD (chronic obstructive pulmonary disease) (HCC)   . Depression   .  Oxygen dependent     home oxygen 3L/min  . Urinary, incontinence, stress female   . Psoriasis    Current Outpatient Prescriptions on File Prior to Visit  Medication Sig Dispense Refill  . ADVAIR DISKUS 500-50 MCG/DOSE AEPB INHALE 1 PUFF INTO THE LUNGS 2 TIMES DAILY 60 each 3  . ALPRAZolam (XANAX) 1 MG tablet 1 tab, three times daily as needed (Patient taking differently: Take 1 mg by mouth 3 (three) times daily as needed for anxiety. 1 tab, three times daily as needed) 90 tablet 5  . fluticasone (FLONASE) 50 MCG/ACT nasal spray PLACE 2 SPRAYS INTO BOTH NOSTRILS DAILY. 16 g 2  . guaiFENesin (MUCINEX) 600 MG 12 hr tablet Take 1 tablet (600 mg total) by mouth 2 (two) times daily. 30 tablet 0  . ibuprofen (ADVIL,MOTRIN) 200 MG tablet Take 200 mg by mouth every 6 (six) hours as needed for fever, headache, mild pain, moderate pain or cramping.    April ipratropium (ATROVENT) 0.02 % nebulizer solution Take 2.5 mLs (0.5 mg total) by nebulization 3 (three) times daily. 75 mL 12  . levalbuterol (XOPENEX) 1.25 MG/0.5ML nebulizer solution Take 1.25 mg by nebulization 3 (three) times daily. 1 each 12  . Morphine Sulfate (MORPHINE CONCENTRATE) 10 mg / 0.5 ml concentrated solution 2.5mg  po q12hr prn for sob 30 mL 0  .  pantoprazole (PROTONIX) 20 MG tablet TAKE 1 TABLET (20 MG TOTAL) BY MOUTH DAILY BEFORE SUPPER. 30 tablet 5  . predniSONE (DELTASONE) 10 MG tablet Label  & dispense according to the schedule below. 6 Pills PO for 3 days then, 5 Pills PO for 3 days, 4 Pills PO for 3 days, 3 Pills PO for 3 days, 2 Pills PO for 3 days then back to baseline prednisone dose. 60 tablet 0  . SPIRIVA HANDIHALER 18 MCG inhalation capsule PLACE 1 CAPSULE (18 MCG TOTAL) INTO INHALER AND INHALE DAILY. 30 capsule 4  . chlorpheniramine-HYDROcodone (TUSSIONEX) 10-8 MG/5ML SUER Take 5 mLs by mouth every 12 (twelve) hours. (Patient not taking: Reported on 01/24/2016) 140 mL 0  . levalbuterol (XOPENEX HFA) 45 MCG/ACT inhaler Inhale 2 puffs  into the lungs every 4 (four) hours as needed for wheezing or shortness of breath. (Patient not taking: Reported on 01/24/2016) 1 Inhaler 12   No current facility-administered medications on file prior to visit.     Review of Systems Constitutional:   No  weight loss, night sweats,  Fevers, chills, + fatigue, or  lassitude.  HEENT:   No headaches,  Difficulty swallowing,  Tooth/dental problems, or  Sore throat,                No sneezing, itching, ear ache, nasal congestion, post nasal drip,   CV:  No chest pain,  Orthopnea, PND, swelling in lower extremities, anasarca, dizziness, palpitations, syncope.   GI  No, bloody stools.   Resp:    No chest wall deformity  Skin: no rash or lesions.  GU: no dysuria, change in color of urine, no urgency or frequency.  No flank pain, no hematuria   MS:  No joint pain or swelling.  No decreased range of motion.  No back pain.  Psych:  No change in mood or affect. No depression or anxiety.  No memory loss.         Objective:   Physical Exam  Filed Vitals:   01/24/16 1140  BP: 120/74  Temp: 97.7 F (36.5 C)  TempSrc: Oral  Height: 5\' 1"  (1.549 m)  Weight: 132 lb (59.875 kg)  SpO2: 96%   Body mass index is 24.95 kg/(m^2).   GEN: A/Ox3; on o2 and in wc  HEENT:  [place]/AT,  EACs-clear, TMs-wnl, NOSE-clear, THROAT-clear, no lesions, no postnasal drip or exudate noted.   NECK:  Supple w/ fair ROM; no JVD; normal carotid impulses w/o bruits; no thyromegaly or nodules palpated; no lymphadenopathy.  RESP  Decreased BS in bases, .no accessory muscle use, no dullness to percussion  CARD:  ST  no m/r/g  , no peripheral edema, pulses intact, no cyanosis or clubbing.  GI:   Soft & nt; nml bowel sounds;no guarding  no organomegaly or masses detected.  Musco: Warm bil, no deformities or joint swelling noted.   Neuro: alert, no focal deficits noted.    Skin: Warm, no lesions or rashes   Tammy Parrett NP-C  [facility]  01/24/2016      Assessment & Plan:

## 2016-01-24 NOTE — Assessment & Plan Note (Signed)
Recurrent exacerbations   Plan  Taper prednisone to 10mg  daily and hold at this dose.  Continue on Advair and Spiriva , rinse after use.  Continue on Oxygen 3l/m .  Use Morphine As needed  For air hunger.  Continue on BIPAP At bedtime   We will check on sleep study  follow up Dr.  In 1 month as planned and As needed   Please contact office for sooner follow up if symptoms do not improve or worsen or seek emergency care

## 2016-01-24 NOTE — Assessment & Plan Note (Signed)
Cont on BIPAP At bedtime   Has Sleep study pending -needs this in order to cover BIPAP with insurance.

## 2016-01-24 NOTE — Addendum Note (Signed)
Addended by: Karalee Height on: 01/24/2016 04:45 PM   Modules accepted: Orders

## 2016-01-25 ENCOUNTER — Telehealth: Payer: Self-pay | Admitting: Internal Medicine

## 2016-01-25 DIAGNOSIS — I509 Heart failure, unspecified: Secondary | ICD-10-CM | POA: Diagnosis not present

## 2016-01-25 DIAGNOSIS — Z9981 Dependence on supplemental oxygen: Secondary | ICD-10-CM | POA: Diagnosis not present

## 2016-01-25 DIAGNOSIS — J189 Pneumonia, unspecified organism: Secondary | ICD-10-CM | POA: Diagnosis not present

## 2016-01-25 DIAGNOSIS — J441 Chronic obstructive pulmonary disease with (acute) exacerbation: Secondary | ICD-10-CM | POA: Diagnosis not present

## 2016-01-25 NOTE — Telephone Encounter (Signed)
French Ana from [facility] called to get VO to continue monitoring the pt. B/c she just let the hospital. Please f/u

## 2016-01-25 NOTE — Telephone Encounter (Signed)
lmtcb x1 for pt's daughter, Wendy [name].

## 2016-01-26 DIAGNOSIS — J441 Chronic obstructive pulmonary disease with (acute) exacerbation: Secondary | ICD-10-CM | POA: Diagnosis not present

## 2016-01-26 DIAGNOSIS — Z9981 Dependence on supplemental oxygen: Secondary | ICD-10-CM | POA: Diagnosis not present

## 2016-01-26 DIAGNOSIS — J189 Pneumonia, unspecified organism: Secondary | ICD-10-CM | POA: Diagnosis not present

## 2016-01-26 DIAGNOSIS — I509 Heart failure, unspecified: Secondary | ICD-10-CM | POA: Diagnosis not present

## 2016-01-26 NOTE — Telephone Encounter (Signed)
Called, spoke with Marion, pt's daughter.  Reports this has already been taken care of.  Nothing further needed at this time.

## 2016-01-27 DIAGNOSIS — J441 Chronic obstructive pulmonary disease with (acute) exacerbation: Secondary | ICD-10-CM | POA: Diagnosis not present

## 2016-01-27 DIAGNOSIS — I509 Heart failure, unspecified: Secondary | ICD-10-CM | POA: Diagnosis not present

## 2016-01-27 DIAGNOSIS — Z9981 Dependence on supplemental oxygen: Secondary | ICD-10-CM | POA: Diagnosis not present

## 2016-01-27 DIAGNOSIS — F419 Anxiety disorder, unspecified: Secondary | ICD-10-CM | POA: Diagnosis not present

## 2016-01-27 DIAGNOSIS — J189 Pneumonia, unspecified organism: Secondary | ICD-10-CM | POA: Diagnosis not present

## 2016-01-27 DIAGNOSIS — F329 Major depressive disorder, single episode, unspecified: Secondary | ICD-10-CM | POA: Diagnosis not present

## 2016-01-27 NOTE — Telephone Encounter (Signed)
Called Jolee Ewing RN with [facility]. Left VM with VO to continue home health.

## 2016-01-28 DIAGNOSIS — J441 Chronic obstructive pulmonary disease with (acute) exacerbation: Secondary | ICD-10-CM | POA: Diagnosis not present

## 2016-01-28 DIAGNOSIS — J189 Pneumonia, unspecified organism: Secondary | ICD-10-CM | POA: Diagnosis not present

## 2016-01-28 DIAGNOSIS — F329 Major depressive disorder, single episode, unspecified: Secondary | ICD-10-CM | POA: Diagnosis not present

## 2016-01-28 DIAGNOSIS — Z9981 Dependence on supplemental oxygen: Secondary | ICD-10-CM | POA: Diagnosis not present

## 2016-01-28 DIAGNOSIS — F419 Anxiety disorder, unspecified: Secondary | ICD-10-CM | POA: Diagnosis not present

## 2016-01-28 DIAGNOSIS — I509 Heart failure, unspecified: Secondary | ICD-10-CM | POA: Diagnosis not present

## 2016-01-29 DIAGNOSIS — Z9981 Dependence on supplemental oxygen: Secondary | ICD-10-CM | POA: Diagnosis not present

## 2016-01-29 DIAGNOSIS — I509 Heart failure, unspecified: Secondary | ICD-10-CM | POA: Diagnosis not present

## 2016-01-29 DIAGNOSIS — J441 Chronic obstructive pulmonary disease with (acute) exacerbation: Secondary | ICD-10-CM | POA: Diagnosis not present

## 2016-01-29 DIAGNOSIS — F419 Anxiety disorder, unspecified: Secondary | ICD-10-CM | POA: Diagnosis not present

## 2016-01-29 DIAGNOSIS — J189 Pneumonia, unspecified organism: Secondary | ICD-10-CM | POA: Diagnosis not present

## 2016-01-29 DIAGNOSIS — F329 Major depressive disorder, single episode, unspecified: Secondary | ICD-10-CM | POA: Diagnosis not present

## 2016-01-30 ENCOUNTER — Ambulatory Visit: Payer: Medicare Other | Attending: Family Medicine

## 2016-01-30 DIAGNOSIS — R112 Nausea with vomiting, unspecified: Secondary | ICD-10-CM | POA: Insufficient documentation

## 2016-01-30 DIAGNOSIS — D638 Anemia in other chronic diseases classified elsewhere: Secondary | ICD-10-CM | POA: Diagnosis not present

## 2016-01-30 LAB — CBC
HCT: 33 % — ABNORMAL LOW (ref 35.0–45.0)
Hemoglobin: 10.3 g/dL — ABNORMAL LOW (ref 11.7–15.5)
MCH: 27.5 pg (ref 27.0–33.0)
MCHC: 31.2 g/dL — ABNORMAL LOW (ref 32.0–36.0)
MCV: 88.2 fL (ref 80.0–100.0)
MPV: 8.5 fL (ref 7.5–12.5)
PLATELETS: 372 10*3/uL (ref 140–400)
RBC: 3.74 MIL/uL — ABNORMAL LOW (ref 3.80–5.10)
RDW: 14.2 % (ref 11.0–15.0)
WBC: 9.1 10*3/uL (ref 3.8–10.8)

## 2016-01-30 LAB — COMPLETE METABOLIC PANEL WITH GFR
ALT: 17 U/L (ref 6–29)
AST: 13 U/L (ref 10–35)
Albumin: 4 g/dL (ref 3.6–5.1)
Alkaline Phosphatase: 28 U/L — ABNORMAL LOW (ref 33–130)
BUN: 7 mg/dL (ref 7–25)
CHLORIDE: 90 mmol/L — AB (ref 98–110)
CO2: 38 mmol/L — AB (ref 20–31)
CREATININE: 0.64 mg/dL (ref 0.50–1.05)
Calcium: 9.2 mg/dL (ref 8.6–10.4)
GFR, Est Non African American: >89 mL/min (ref >=60)
Glucose, Bld: 111 mg/dL — ABNORMAL HIGH (ref 65–99)
POTASSIUM: 4 mmol/L (ref 3.5–5.3)
Sodium: 140 mmol/L (ref 135–146)
Total Bilirubin: 0.5 mg/dL (ref 0.2–1.2)
Total Protein: 6.1 g/dL (ref 6.1–8.1)

## 2016-01-31 DIAGNOSIS — J449 Chronic obstructive pulmonary disease, unspecified: Secondary | ICD-10-CM | POA: Diagnosis not present

## 2016-02-01 ENCOUNTER — Telehealth: Payer: Self-pay | Admitting: Internal Medicine

## 2016-02-01 ENCOUNTER — Telehealth: Payer: Self-pay | Admitting: Family Medicine

## 2016-02-01 DIAGNOSIS — F329 Major depressive disorder, single episode, unspecified: Secondary | ICD-10-CM | POA: Diagnosis not present

## 2016-02-01 DIAGNOSIS — J441 Chronic obstructive pulmonary disease with (acute) exacerbation: Secondary | ICD-10-CM | POA: Diagnosis not present

## 2016-02-01 DIAGNOSIS — F419 Anxiety disorder, unspecified: Secondary | ICD-10-CM | POA: Diagnosis not present

## 2016-02-01 DIAGNOSIS — I509 Heart failure, unspecified: Secondary | ICD-10-CM | POA: Diagnosis not present

## 2016-02-01 DIAGNOSIS — Z9981 Dependence on supplemental oxygen: Secondary | ICD-10-CM | POA: Diagnosis not present

## 2016-02-01 NOTE — Telephone Encounter (Signed)
Aimee from [facility]osp[facility] called wanting to know if Dr. Armen Pickup will be her attending provider while in Hospice. Please follow up.

## 2016-02-01 NOTE — Telephone Encounter (Signed)
Pt states that [facility] and they were able to schedule her a sooner sleep study appt - scheduled for Saturday Feb 04, 2016 Pt states that she received an e-mail from [facility] stating that due to not having sleep study done in adequate time insurance is not covering her machine/O2. Pt states that they advised her that they are withdrawing $400+ from her account on 02/04/16. Pt does not understand why she is being charged. Aware that I would contact [facility] and call her back.  Called [facility], spoke with [place]. Aware that when the patient was initially supposed to have her sleep study she was seen in the ED and had to cancel this appt. Since this, patient has been on the cancellation list at [facility] and was finally schedule for her appt for 02/04/16. Asked about the possible charges on the patient's account and what this is for and she transferred my call to [facility].   Spoke with Cyndra Numbers- RT Manager.  Pt was d/c 12/20/2015 with BiPAP and O2. Pt was not qualified for O2 and CPAP before it was prescribed, so she is being charged for the use of these since d/c. Pt needs to contact the financial reimbursement dept. and work out a Insurance claims handler or to see if she qualifies for some assistance. They will not back pay or reimburse for any services prior to being qualified for CPAP/O2.  States that [facility] reviewed this information with the patient when she was discharged from hospital. Pt needs to call [phone] Sleep study is qualify the patient for the services provided.  Hypercapnic respiratory failure, chronic (HCC) - Julio Sicks, NP at 01/24/2016 4:08 PM     Status: Written Related Problem: Hypercapnic respiratory failure, chronic (HCC)   Expand All Collapse All   Cont on BIPAP At bedtime  Has Sleep study pending -needs this in order to cover BIPAP with insurance.        Called patient back and explained the above to her. Patient states that she is going to contact [facility]. Advised that  if she needs anything further from our office to call. Nothing further needed.

## 2016-02-03 ENCOUNTER — Telehealth: Payer: Self-pay | Admitting: Internal Medicine

## 2016-02-03 DIAGNOSIS — I509 Heart failure, unspecified: Secondary | ICD-10-CM | POA: Diagnosis not present

## 2016-02-03 DIAGNOSIS — F329 Major depressive disorder, single episode, unspecified: Secondary | ICD-10-CM | POA: Diagnosis not present

## 2016-02-03 DIAGNOSIS — J441 Chronic obstructive pulmonary disease with (acute) exacerbation: Secondary | ICD-10-CM | POA: Diagnosis not present

## 2016-02-03 DIAGNOSIS — F419 Anxiety disorder, unspecified: Secondary | ICD-10-CM | POA: Diagnosis not present

## 2016-02-03 DIAGNOSIS — Z9981 Dependence on supplemental oxygen: Secondary | ICD-10-CM | POA: Diagnosis not present

## 2016-02-03 NOTE — Telephone Encounter (Signed)
Wendy [name] stated Pt will like for PCP to be her attending provider

## 2016-02-03 NOTE — Telephone Encounter (Signed)
Spoke with Amy with [facility]. They are needing to know if CY will be pt's attending MD. If he agrees, they would like to reactive the standing orders they have on file for her.  CY - please advise. Thanks.

## 2016-02-03 NOTE — Telephone Encounter (Signed)
Lonie Peak from Hosp[facility] called needing the answer on this asap to hold the appt that they have for her [phone]

## 2016-02-03 NOTE — Telephone Encounter (Signed)
Spoke with Dois Davenport at [facility]. She is aware of the below information. Nothing further was needed.

## 2016-02-03 NOTE — Telephone Encounter (Signed)
Called back to Aimee Left VM Informed her that I would not be the attending physician while patient is on Hospice Asked to be called back with questions

## 2016-02-03 NOTE — Telephone Encounter (Signed)
Per CY-he will be the attending but have hospice MD manage orders,etc. Thanks.

## 2016-02-04 ENCOUNTER — Ambulatory Visit (HOSPITAL_BASED_OUTPATIENT_CLINIC_OR_DEPARTMENT_OTHER): Payer: Medicare Other | Attending: Internal Medicine

## 2016-02-05 DIAGNOSIS — J449 Chronic obstructive pulmonary disease, unspecified: Secondary | ICD-10-CM | POA: Diagnosis not present

## 2016-02-17 ENCOUNTER — Telehealth: Payer: Self-pay | Admitting: Internal Medicine

## 2016-02-17 NOTE — Telephone Encounter (Signed)
Spoke with Jan @ [facility] and advised that per pt chart Dr. Armen Pickup changed pt's rx from albuterol to xopenex. Pt should have nebs and HFA. Jan states that pt can only find the nebs. Advised that she may want to call pharmacy and make sure that HFA was filled. She will contact pharmacy or Dr. Armen Pickup office. Nothing further needed.

## 2016-02-24 ENCOUNTER — Other Ambulatory Visit: Payer: Self-pay | Admitting: Internal Medicine

## 2016-02-28 ENCOUNTER — Telehealth: Payer: Self-pay | Admitting: Internal Medicine

## 2016-02-28 MED ORDER — PANTOPRAZOLE SODIUM 20 MG PO TBEC: DELAYED_RELEASE_TABLET | ORAL | Status: DC

## 2016-02-28 NOTE — Telephone Encounter (Signed)
Rx for protonix was refilled

## 2016-02-28 NOTE — Telephone Encounter (Signed)
If possible, please let Hospice doctor manage her narcotics. Thanks.

## 2016-02-28 NOTE — Telephone Encounter (Signed)
Spoke with the pt's hospice nurse, Clifton Custard  She states that pt has been using her roxanol every 3-4 hours, despite rx written by Dr Armen Pickup stating med to be taken q 12 hours prn  She states pt is now needing a new rx [facility]ed to [facility] [address]  Last rx written on 01/23/16 roxonal 10 mg/0.5 ml sol # 30 ml 2.5 mg every 12 hours as needed  Please advise thanks!

## 2016-02-28 NOTE — Telephone Encounter (Signed)
Spoke with the Clifton Custard and notified of recs per CDY  She verbalized understanding and nothing further needed

## 2016-02-29 ENCOUNTER — Telehealth: Payer: Self-pay | Admitting: Internal Medicine

## 2016-02-29 NOTE — Telephone Encounter (Signed)
Spoke with [name] at [facility], requesting a refill on Roxanol.  Pt is currently taking Roxanol 5mg  (0.62mL) qid, whereas rx states for pt to use BID-Wendy is requesting that the new order state frequency of QID.   Last refill: 01/23/16 #55mL- written by Dr. 31m  [facility] [address]    Last ov: 01/24/16 with TP Next ov: 03/15/16 with CY  CY please advise.  Thanks   Allergies  Allergen Reactions  . Augmentin [Amoxicillin-Pot Clavulanate] Nausea Only    Has patient had a PCN reaction causing immediate rash, facial/tongue/throat swelling, SOB or lightheadedness with hypotension: No Has patient had a PCN reaction causing severe rash involving mucus membranes or skin necrosis: No Has patient had a PCN reaction that required hospitalization No Has patient had a PCN reaction occurring within the last 10 years: Yes If all of the above answers are "NO", then may proceed with Cephalosporin use.   . Cefdinir Other (See Comments)    Aches, "heart fluttering"  . Doxycycline Nausea And Vomiting  . Other Nausea Only    PAIN MEDICATIONS-nausea  . Codeine Nausea Only  . Fexofenadine Nausea Only  . Hydralazine     Side effect Felt woozy   Current Outpatient Prescriptions on File Prior to Visit  Medication Sig Dispense Refill  . ADVAIR DISKUS 500-50 MCG/DOSE AEPB INHALE 1 PUFF INTO THE LUNGS 2 TIMES DAILY 60 each 3  . ALPRAZolam (XANAX) 1 MG tablet 1 tab, three times daily as needed (Patient taking differently: Take 1 mg by mouth 3 (three) times daily as needed for anxiety. 1 tab, three times daily as needed) 90 tablet 5  . chlorpheniramine-HYDROcodone (TUSSIONEX) 10-8 MG/5ML SUER Take 5 mLs by mouth every 12 (twelve) hours. (Patient not taking: Reported on 01/24/2016) 140 mL 0  . fluticasone (FLONASE) 50 MCG/ACT nasal spray PLACE 2 SPRAYS INTO BOTH NOSTRILS DAILY. 16 g 2  . guaiFENesin (MUCINEX) 600 MG 12 hr tablet Take 1 tablet (600 mg total) by mouth 2 (two) times daily. 30 tablet 0  .  ibuprofen (ADVIL,MOTRIN) 200 MG tablet Take 200 mg by mouth every 6 (six) hours as needed for fever, headache, mild pain, moderate pain or cramping.    . levalbuterol (XOPENEX HFA) 45 MCG/ACT inhaler Inhale 2 puffs into the lungs every 4 (four) hours as needed for wheezing or shortness of breath. (Patient not taking: Reported on 01/24/2016) 1 Inhaler 12  . levalbuterol (XOPENEX) 1.25 MG/0.5ML nebulizer solution Take 1.25 mg by nebulization 3 (three) times daily. 1 each 12  . Morphine Sulfate (MORPHINE CONCENTRATE) 10 mg / 0.5 ml concentrated solution 2.5mg  po q12hr prn for sob 30 mL 0  . pantoprazole (PROTONIX) 20 MG tablet TAKE 1 TABLET (20 MG TOTAL) BY MOUTH DAILY BEFORE SUPPER. 30 tablet 5  . predniSONE (DELTASONE) 10 MG tablet Label  & dispense according to the schedule below. 6 Pills PO for 3 days then, 5 Pills PO for 3 days, 4 Pills PO for 3 days, 3 Pills PO for 3 days, 2 Pills PO for 3 days then back to baseline prednisone dose. 60 tablet 0  . SPIRIVA HANDIHALER 18 MCG inhalation capsule PLACE 1 CAPSULE (18 MCG TOTAL) INTO INHALER AND INHALE DAILY. 30 capsule 4   No current facility-administered medications on file prior to visit.

## 2016-03-01 NOTE — Telephone Encounter (Signed)
I spoke with [name] and notified, again, to let PCP prescribe narcs She verbalized understanding  Nothing further needed

## 2016-03-01 NOTE — Telephone Encounter (Signed)
Wendy [name] is calling about this request.  CY - please advise. Thanks.

## 2016-03-01 NOTE — Telephone Encounter (Signed)
[name] calling again. Has not heard back yet. Doesn't want the patient to run out of morphine.

## 2016-03-01 NOTE — Telephone Encounter (Signed)
I spoke with one of our triage nurses before about this but see no note. I asked that we ask Hospice doctor to write the narcotic scripts. Ok for [place] to refill, ONLY if they won't. Please call Hospice and clarify this.

## 2016-03-01 NOTE — Telephone Encounter (Signed)
I spoke with Clifton Custard about her narcs on 02/28/16 (see below):   Christen Butter, CMA at 02/28/2016 4:56 PM     Status: Signed       Expand All Collapse All   Spoke with the Clifton Custard and notified of recs per CDY  She verbalized understanding and nothing further needed            Waymon Budge, MD at 02/28/2016 4:45 PM     Status: Signed       Expand All Collapse All   If possible, please let Hospice doctor manage her narcotics. Thanks.            Christen Butter, CMA at 02/28/2016 4:03 PM      LMTCB for [name]

## 2016-03-02 ENCOUNTER — Other Ambulatory Visit: Payer: Self-pay | Admitting: Family Medicine

## 2016-03-02 DIAGNOSIS — J449 Chronic obstructive pulmonary disease, unspecified: Secondary | ICD-10-CM

## 2016-03-02 MED ORDER — LEVALBUTEROL TARTRATE 45 MCG/ACT IN AERO: 2.0000 | INHALATION_SPRAY | RESPIRATORY_TRACT | Status: AC | PRN

## 2016-03-02 MED ORDER — LEVALBUTEROL HCL 1.25 MG/0.5ML IN NEBU: 1.2500 mg | INHALATION_SOLUTION | Freq: Three times a day (TID) | RESPIRATORY_TRACT | Status: DC

## 2016-03-05 ENCOUNTER — Ambulatory Visit: Attending: Family Medicine | Admitting: Family Medicine

## 2016-03-05 ENCOUNTER — Encounter: Payer: Self-pay | Admitting: Family Medicine

## 2016-03-05 VITALS — BP 126/82 | HR 86 | Temp 98.5°F | Resp 16 | Ht 61.0 in | Wt 132.0 lb

## 2016-03-05 DIAGNOSIS — R0602 Shortness of breath: Secondary | ICD-10-CM | POA: Diagnosis not present

## 2016-03-05 DIAGNOSIS — J449 Chronic obstructive pulmonary disease, unspecified: Secondary | ICD-10-CM | POA: Diagnosis not present

## 2016-03-05 DIAGNOSIS — Z79899 Other long term (current) drug therapy: Secondary | ICD-10-CM | POA: Insufficient documentation

## 2016-03-05 DIAGNOSIS — M7989 Other specified soft tissue disorders: Secondary | ICD-10-CM | POA: Diagnosis not present

## 2016-03-05 DIAGNOSIS — R000 Tachycardia, unspecified: Secondary | ICD-10-CM | POA: Diagnosis not present

## 2016-03-05 DIAGNOSIS — Z87891 Personal history of nicotine dependence: Secondary | ICD-10-CM | POA: Diagnosis not present

## 2016-03-05 DIAGNOSIS — D649 Anemia, unspecified: Secondary | ICD-10-CM | POA: Diagnosis not present

## 2016-03-05 LAB — BASIC METABOLIC PANEL WITH GFR
BUN: 6 mg/dL — AB (ref 7–25)
CHLORIDE: 94 mmol/L — AB (ref 98–110)
CO2: 36 mmol/L — AB (ref 20–31)
CREATININE: 0.6 mg/dL (ref 0.50–1.05)
Calcium: 9.3 mg/dL (ref 8.6–10.4)
GFR, Est African American: >89 mL/min (ref >=60)
GFR, Est Non African American: >89 mL/min (ref >=60)
GLUCOSE: 90 mg/dL (ref 65–99)
Potassium: 3.8 mmol/L (ref 3.5–5.3)
Sodium: 141 mmol/L (ref 135–146)

## 2016-03-05 LAB — IRON AND TIBC
%SAT: 13 % (ref 11–50)
Iron: 42 ug/dL — ABNORMAL LOW (ref 45–160)
TIBC: 329 ug/dL (ref 250–450)
UIBC: 287 ug/dL (ref 125–400)

## 2016-03-05 LAB — CBC
HEMATOCRIT: 33.2 % — AB (ref 35.0–45.0)
HEMOGLOBIN: 10.5 g/dL — AB (ref 11.7–15.5)
MCH: 27.7 pg (ref 27.0–33.0)
MCHC: 31.6 g/dL — ABNORMAL LOW (ref 32.0–36.0)
MCV: 87.6 fL (ref 80.0–100.0)
MPV: 9.1 fL (ref 7.5–12.5)
Platelets: 281 10*3/uL (ref 140–400)
RBC: 3.79 MIL/uL — AB (ref 3.80–5.10)
RDW: 14.4 % (ref 11.0–15.0)
WBC: 9.2 10*3/uL (ref 3.8–10.8)

## 2016-03-05 MED ORDER — PREDNISONE 10 MG PO TABS: 20.0000 mg | ORAL_TABLET | Freq: Every day | ORAL | Status: DC

## 2016-03-05 NOTE — Progress Notes (Signed)
F/U tachycardia  Rt foot swelling  Pt stated taking low dose of Morphine  Helping with HR No pain today  Former smoker  No suicidal thoughts in the past two weeks

## 2016-03-05 NOTE — Progress Notes (Signed)
Subjective:  Patient ID: Wendy [name], female    DOB: Aug 19, 1961  Age: 55 y.o. MRN: [id number]  CC: Tachycardia and COPD   HPI KYOMI HECTOR has end stage COPD, on Hospice, DNR, chronic diastolic heart failure   1. COPD: in hospice. Has chronic SOB. On O2. Following her 01/2016 hospitalization she has tapered prednisone down to 20 mg daily.   2. Tachycardia: resolved. She believes that morphine sulfate solution has helped the most.    3. Foot swelling: over past week. Improved now. Dorsal foot swelling. No worsening of SOB. No increase in sodium. No rash, fever or color changes.   Social History  Substance Use Topics  . Smoking status: Former Smoker -- 1.00 packs/day for 40 years    Types: Cigarettes    Start date: 11/10/1970    Quit date: 09/17/2013  . Smokeless tobacco: Never Used  . Alcohol Use: No     Comment: 5 cans of beer daily  " 06/2014 drinks occasional ".  3s/2/16 No longer drink    Outpatient Prescriptions Prior to Visit  Medication Sig Dispense Refill  . ADVAIR DISKUS 500-50 MCG/DOSE AEPB INHALE 1 PUFF INTO THE LUNGS 2 TIMES DAILY 60 each 3  . ALPRAZolam (XANAX) 1 MG tablet 1 tab, three times daily as needed (Patient taking differently: Take 1 mg by mouth 3 (three) times daily as needed for anxiety. 1 tab, three times daily as needed) 90 tablet 5  . chlorpheniramine-HYDROcodone (TUSSIONEX) 10-8 MG/5ML SUER Take 5 mLs by mouth every 12 (twelve) hours. 140 mL 0  . fluticasone (FLONASE) 50 MCG/ACT nasal spray PLACE 2 SPRAYS INTO BOTH NOSTRILS DAILY. 16 g 2  . guaiFENesin (MUCINEX) 600 MG 12 hr tablet Take 1 tablet (600 mg total) by mouth 2 (two) times daily. 30 tablet 0  . ibuprofen (ADVIL,MOTRIN) 200 MG tablet Take 200 mg by mouth every 6 (six) hours as needed for fever, headache, mild pain, moderate pain or cramping.    . levalbuterol (XOPENEX HFA) 45 MCG/ACT inhaler Inhale 2 puffs into the lungs every 4 (four) hours as needed for wheezing or shortness of  breath. 1 Inhaler 12  . levalbuterol (XOPENEX) 1.25 MG/0.5ML nebulizer solution Take 1.25 mg by nebulization 3 (three) times daily. 1 each 12  . Morphine Sulfate (MORPHINE CONCENTRATE) 10 mg / 0.5 ml concentrated solution 2.5mg  po q12hr prn for sob 30 mL 0  . pantoprazole (PROTONIX) 20 MG tablet TAKE 1 TABLET (20 MG TOTAL) BY MOUTH DAILY BEFORE SUPPER. 30 tablet 5  . predniSONE (DELTASONE) 10 MG tablet Label  & dispense according to the schedule below. 6 Pills PO for 3 days then, 5 Pills PO for 3 days, 4 Pills PO for 3 days, 3 Pills PO for 3 days, 2 Pills PO for 3 days then back to baseline prednisone dose. 60 tablet 0  . SPIRIVA HANDIHALER 18 MCG inhalation capsule PLACE 1 CAPSULE (18 MCG TOTAL) INTO INHALER AND INHALE DAILY. 30 capsule 4   No facility-administered medications prior to visit.    ROS Review of Systems  Constitutional: Negative for fever and chills.  Eyes: Negative for visual disturbance.  Respiratory: Positive for shortness of breath.   Cardiovascular: Positive for chest pain (swelling in feet ) and leg swelling. Negative for palpitations.  Gastrointestinal: Negative for abdominal pain and blood in stool.  Musculoskeletal: Negative for back pain and arthralgias.  Skin: Negative for rash.  Allergic/Immunologic: Negative for immunocompromised state.  Hematological: Negative for adenopathy. Does not bruise/bleed  easily.  Psychiatric/Behavioral: Negative for suicidal ideas and dysphoric mood.    Objective:  BP 126/82 mmHg  Pulse 86  Temp(Src) 98.5 F (36.9 C) (Oral)  Resp 16  Ht 5\' 1"  (1.549 m)  Wt 132 lb (59.875 kg)  BMI 24.95 kg/m2  SpO2 91% on 3 L [place]   BP/Weight 03/05/2016 01/24/2016 01/23/2016  Systolic BP 126 120 121  Diastolic BP 82 74 78  Wt. (Lbs) 132 132 132  BMI 24.95 24.95 24.95   Physical Exam  Constitutional: She is oriented to person, place, and time. She appears well-developed and well-nourished. No distress.  HENT:  Head: Normocephalic and  atraumatic.  Cardiovascular: Normal rate, regular rhythm, normal heart sounds and intact distal pulses.   Pulmonary/Chest: No accessory muscle usage. No respiratory distress.  Musculoskeletal: She exhibits edema (b/l dorsal foot swelling ).  Neurological: She is alert and oriented to person, place, and time.  Skin: Skin is warm and dry. No rash noted.  Psychiatric: She has a normal mood and affect.     Assessment & Plan:   There are no diagnoses linked to this encounter. Hartleigh was seen today for tachycardia and copd.  Diagnoses and all orders for this visit:  COPD, severe (HCC) -     predniSONE (DELTASONE) 10 MG tablet; Take 2 tablets (20 mg total) by mouth daily with breakfast.  Foot swelling -     Brain natriuretic peptide -     CBC -     BASIC METABOLIC PANEL WITH GFR -     Iron and TIBC -     Iron  Tachycardia   Meds ordered this encounter  Medications  . predniSONE (DELTASONE) 10 MG tablet    Sig: Take 2 tablets (20 mg total) by mouth daily with breakfast.    Dispense:  60 tablet    Refill:  3    Follow-up: Return in about 6 weeks (around 04/16/2016).   04/18/2016 MD

## 2016-03-05 NOTE — Patient Instructions (Addendum)
Diagnoses and all orders for this visit:  COPD, severe (HCC) -     predniSONE (DELTASONE) 10 MG tablet; Take 2 tablets (20 mg total) by mouth daily with breakfast.  Foot swelling -     Brain natriuretic peptide -     CBC -     BASIC METABOLIC PANEL WITH GFR -     Iron and TIBC -     Iron   F/u in 6 weeks for R foot swelling   Dr. Armen Pickup

## 2016-03-06 DIAGNOSIS — M7989 Other specified soft tissue disorders: Secondary | ICD-10-CM | POA: Insufficient documentation

## 2016-03-06 LAB — BRAIN NATRIURETIC PEPTIDE: Brain Natriuretic Peptide: 67 pg/mL (ref <100)

## 2016-03-06 NOTE — Assessment & Plan Note (Signed)
B/l foot swelling without evidence of DVT or infection. Patient's weight is stable. No evidence of fluid overload.   Plan: Elevate legs CBC, BNP, iron studies, BMP with GFR

## 2016-03-06 NOTE — Assessment & Plan Note (Signed)
Resolved

## 2016-03-15 ENCOUNTER — Ambulatory Visit (INDEPENDENT_AMBULATORY_CARE_PROVIDER_SITE_OTHER): Payer: Medicare Other | Admitting: Internal Medicine

## 2016-03-15 ENCOUNTER — Encounter: Payer: Self-pay | Admitting: Internal Medicine

## 2016-03-15 ENCOUNTER — Encounter (INDEPENDENT_AMBULATORY_CARE_PROVIDER_SITE_OTHER): Payer: Self-pay

## 2016-03-15 VITALS — BP 120/70 | HR 87 | Ht 61.0 in | Wt 137.2 lb

## 2016-03-15 DIAGNOSIS — J449 Chronic obstructive pulmonary disease, unspecified: Secondary | ICD-10-CM | POA: Diagnosis not present

## 2016-03-15 DIAGNOSIS — J9612 Chronic respiratory failure with hypercapnia: Secondary | ICD-10-CM

## 2016-03-15 DIAGNOSIS — J9611 Chronic respiratory failure with hypoxia: Secondary | ICD-10-CM | POA: Diagnosis not present

## 2016-03-15 MED ORDER — CLARITHROMYCIN 500 MG PO TABS: ORAL_TABLET | ORAL | Status: DC

## 2016-03-15 MED ORDER — HYDROCOD POLST-CPM POLST ER 10-8 MG/5ML PO SUER: 5.0000 mL | Freq: Two times a day (BID) | ORAL | Status: DC

## 2016-03-15 NOTE — Patient Instructions (Signed)
Script sent for biaxin antibiotic  Script refilling Tussionex printed

## 2016-03-15 NOTE — Progress Notes (Signed)
Patient ID: Wendy [name], female    DOB: 1961/08/04, 55 y.o.   MRN: [id number] HPI  03/02/15- 78 yoF former smoker followed for COPD, tobacco cessation support, rhinitis, complicated by hx anxiety, psoriasis  FOLLOWS FOR: Pt states her breathing is unchanged since the last visit. She has been taking 5-10 mg pred since last visit. She is using proair twice per day on average and only uses neb about once per month. Oxygen 2-3 L/Advanced. She feels her breathing is more comfortable but it has been in a long time.  Psoriasis treatment changed from methotrexate to Humira. There is concern of interaction with theophylline. Not clear that theophylline has had much benefit for her lungs.  11/14/2015-55 year old female former smoker followed for COPD, tobacco cessation support, rhinitis, complicated by history anxiety, psoriasis/ Humira O2 2-3 liters/Advanced Saw NP 09/29/15- acute visit sinusitis and bronchitis. Given Z-Pak and prednisone burst, tapering back to 20 mg daily maintenance. FOLLOWS FOR: per TP visit on 09/29/15; pt states she can feel like a "cold" is coming on-started yesterday-chest congestion, cough-wet,SOB and wheezing, chills as well-no fever documented  1 month of intermittent sharp left upper anterior chest wall pain radiating to back and scapula. Maintenance prednisone now at 10 mg daily, took extra last night at onset of her cold.  03/15/2016-55 year old female former smoker followed for COPD, chronic hypoxic respiratory failure, rhinitis, complicated by anxiety, psoriasis/Humira, CHF grade 1 diastolic dysfunction O2 2-3 liters/Advanced/ BIPAP for sleep    Hospice LOV 01/24/2016-NP- FOLLOW FOR: coughing up dark gray mucus x 1 week, chest tightness, SOB (more than usual). Currently on prednisone 20 mg daily, no fever. Off Humira and psoriasis is getting worse. Using BiPAP each night with oxygen CXR 01-2016 1V- NAD  Review of Systems-per HPI Constitutional:   No-   weight loss,  night sweats, fevers, chills, fatigue, lassitude. HEENT:   No-  headaches, difficulty swallowing, tooth/dental problems, sore throat,       No-  sneezing, itching, ear ache, +nasal congestion, post nasal drip,  CV:  +  chest pain, orthopnea, PND, swelling in lower extremities, anasarca,  dizziness, palpitations Resp: +Persistent shortness of breath with exertion or at rest.              No-productive cough,  + non-productive cough,  No- coughing up of blood.           No-change in color of mucus.  No- wheezing.   Skin: +psoriasis rash GI:  + heartburn, No- indigestion, abdominal pain, nausea, vomiting,  GU:  MS:  No-   joint pain or swelling.   Neuro-     nothing unusual Psych:  No- change in mood or affect. No depression or anxiety.  No memory loss.   Objective:   Physical Exam General- Alert, Oriented, Affect-appropriate, Distress- none acute. O2 2 L, Wheelchair Skin- +Dry with no visible plaques Lymphadenopathy- none Head- atraumatic            Eyes- Gross vision intact, PERRLA, conjunctivae clear secretions            Ears- Hearing, canals-normal            Nose- + sniffing, turbinate edema, + External and Septal dev, mucus, No polyps, erosion,                             Throat- Mallampati II , mucosa clear , drainage- none, tonsils- atrophic Neck- flexible , trachea midline, no  stridor , thyroid nl, carotid no bruit Chest - symmetrical excursion , unlabored           Heart/CV- RRR , no murmur , no gallop  , no rub, nl s1 s2                           - JVD- none , edema- none, stasis changes- none, varices- none           Lung- +very distant, wheeze-none, cough + rattling, dullness-none,  rub-none.                         Chest wall-  Abd-  Br/ Gen/ Rectal- Not done, not indicated Extrem- cyanosis- none, clubbing, none, atrophy- none, strength- nl Neuro- grossly intact to observation

## 2016-03-17 NOTE — Assessment & Plan Note (Signed)
Continues dependent on BiPAP for OSA and hypoventilation during sleep. Compliance is good.

## 2016-03-17 NOTE — Assessment & Plan Note (Signed)
Chronic bronchitis with exacerbation. We discussed options. Plan-continue bronchodilators. Add Biaxin

## 2016-03-17 NOTE — Assessment & Plan Note (Signed)
She remains on continuous oxygen and I don't anticipate that she will be able to get off of it.

## 2016-03-22 ENCOUNTER — Other Ambulatory Visit: Payer: Self-pay | Admitting: Internal Medicine

## 2016-04-12 ENCOUNTER — Telehealth: Payer: Self-pay | Admitting: Internal Medicine

## 2016-04-12 NOTE — Telephone Encounter (Signed)
lmtcb x1 for Hospice nurse, [place].

## 2016-04-13 NOTE — Telephone Encounter (Signed)
Spoke with Fayrene Fearing at [facility], aware of recs for pt.  Nothing further needed.

## 2016-04-13 NOTE — Telephone Encounter (Signed)
Wendy [name] with hospice calling back. Please call back at [phone]

## 2016-04-13 NOTE — Telephone Encounter (Signed)
Return call.Wendy [name] °

## 2016-04-13 NOTE — Telephone Encounter (Signed)
Spoke with Nita Sells is not working today. She instructed me to call [phone]. I called Hopsice and left a message with a nurse manager to return my call.

## 2016-04-13 NOTE — Telephone Encounter (Signed)
Attempted to contact Hospice back as there was no number documented from the return caller. Line rang several times with no answer. Will try back.

## 2016-04-13 NOTE — Telephone Encounter (Signed)
Spoke with [name] at [facility], states that at visit yesterday hospice nurse reported prod cough with thick yellow/gray mucus.  Pt finished a round of clarithromycin on 04/01/16.  Pt's vitals were normal, no fever, chest pain.   Please call Hospice after hours number to relay recs at [phone]   [facility] [address]    Sending to DOD as CY is unavailable.  MW please advise.  Thanks.   Instructions     Return in about 4 months (around 07/15/2016).  Script sent for biaxin antibiotic   Script refilling Tussionex printed

## 2016-04-13 NOTE — Telephone Encounter (Signed)
No more abx at this point, be sure using mucinex max dose = 1200 mg every 12 hours as needed and the back up xopenex up to every 4 h prn

## 2016-04-24 ENCOUNTER — Telehealth: Payer: Self-pay | Admitting: Internal Medicine

## 2016-04-24 MED ORDER — ALPRAZOLAM 1 MG PO TABS: ORAL_TABLET | ORAL | 5 refills | Status: DC

## 2016-04-24 NOTE — Telephone Encounter (Signed)
Spoke with Clifton Custard, aware of rec's per CY.  Medication list has been updated with changes.  Verbal given for all med changes.  Nothing further needed.

## 2016-04-24 NOTE — Telephone Encounter (Signed)
Ok to change xanax script as requested Suggest she increase prednisone to 30 mg daily through the weekend, then return to 20 mg daily

## 2016-04-24 NOTE — Telephone Encounter (Signed)
Spoke with Wendy [name], states that she is in the home with the patient today and pt is c/o dizziness with SOB. Pt having no air movement and is complaining of CP which radiates to her back. Pain is constant and worse with cough. Wendy [name] states that she does not hear any congestion in the lungs. Wendy [name] states that last week pt was well controlled on the Xop QID with Mucinex 1200mg  BID - neither of these seem to be helping this week. Pt using BiPAP at night along with 3 Liters O2 and is 93% on 3 liters today. Pt taking Xanax TID plus an additional tablet at night d/t increased anxiety - is requesting that the patient's Rx be changed to 1 po TID and PRN bedtime. Pt states that she feels that her CO2 is building up - feels that her legs and feet are swelling but there is no visibile swelling at this time. Pt states that she feels the same as she did right before she went into the hospital with CO2 build up on 01/17/16.  Pt is wanting to stay ahead of her symptoms and possibly prevent any worsening. Pt is also having a flare of her Psoriasis.   Please advise Dr 03/18/16. Thanks.    Medication List       Accurate as of 04/24/16 12:48 PM. Always use your most recent med list.          ADVAIR DISKUS 500-50 MCG/DOSE Aepb Generic drug:  Fluticasone-Salmeterol INHALE 1 PUFF INTO THE LUNGS 2 TIMES DAILY   ALPRAZolam 1 MG tablet Commonly known as:  XANAX 1 tab, three times daily as needed   chlorpheniramine-HYDROcodone 10-8 MG/5ML Suer Commonly known as:  TUSSIONEX Take 5 mLs by mouth every 12 (twelve) hours.   clarithromycin 500 MG tablet Commonly known as:  BIAXIN 1 twice daily after meals   fluticasone 50 MCG/ACT nasal spray Commonly known as:  FLONASE PLACE 2 SPRAYS INTO BOTH NOSTRILS DAILY.   guaiFENesin 600 MG 12 hr tablet Commonly known as:  MUCINEX Take 1 tablet (600 mg total) by mouth 2 (two) times daily.   ibuprofen 200 MG tablet Commonly known as:  ADVIL,MOTRIN Take 200 mg by mouth every 6  (six) hours as needed for fever, headache, mild pain, moderate pain or cramping.   levalbuterol 1.25 MG/0.5ML nebulizer solution Commonly known as:  XOPENEX Take 1.25 mg by nebulization 3 (three) times daily.   levalbuterol 45 MCG/ACT inhaler Commonly known as:  XOPENEX HFA Inhale 2 puffs into the lungs every 4 (four) hours as needed for wheezing or shortness of breath.   morphine CONCENTRATE 10 mg / 0.5 ml concentrated solution 2.5mg  po q12hr prn for sob   pantoprazole 20 MG tablet Commonly known as:  PROTONIX TAKE 1 TABLET (20 MG TOTAL) BY MOUTH DAILY BEFORE SUPPER.   predniSONE 10 MG tablet Commonly known as:  DELTASONE Take 2 tablets (20 mg total) by mouth daily with breakfast.   SPIRIVA HANDIHALER 18 MCG inhalation capsule Generic drug:  tiotropium PLACE 1 CAPSULE (18 MCG TOTAL) INTO INHALER AND INHALE DAILY.   traZODone 50 MG tablet Commonly known as:  DESYREL TAKE 1 TABLET (50 MG TOTAL) BY MOUTH AT BEDTIME.      Allergies  Allergen Reactions  . Augmentin [Amoxicillin-Pot Clavulanate] Nausea Only    Has patient had a PCN reaction causing immediate rash, facial/tongue/throat swelling, SOB or lightheadedness with hypotension: No Has patient had a PCN reaction causing severe rash involving mucus membranes or skin necrosis:  No Has patient had a PCN reaction that required hospitalization No Has patient had a PCN reaction occurring within the last 10 years: Yes If all of the above answers are "NO", then may proceed with Cephalosporin use.   . Cefdinir Other (See Comments)    Aches, "heart fluttering"  . Doxycycline Nausea And Vomiting  . Other Nausea Only    PAIN MEDICATIONS-nausea  . Codeine Nausea Only  . Fexofenadine Nausea Only  . Hydralazine     Side effect Felt woozy

## 2016-04-25 ENCOUNTER — Telehealth: Payer: Self-pay | Admitting: Internal Medicine

## 2016-04-25 DIAGNOSIS — J449 Chronic obstructive pulmonary disease, unspecified: Secondary | ICD-10-CM

## 2016-04-25 MED ORDER — PREDNISONE 10 MG PO TABS
20.0000 mg | ORAL_TABLET | Freq: Every day | ORAL | 0 refills | Status: DC
Start: 2016-04-25 — End: 2016-07-23

## 2016-04-25 MED ORDER — ALPRAZOLAM 1 MG PO TABS: ORAL_TABLET | ORAL | 5 refills | Status: DC

## 2016-04-25 NOTE — Telephone Encounter (Signed)
lmomtcb x1 for pt 

## 2016-04-25 NOTE — Telephone Encounter (Signed)
Called and spoke with pt and she stated that hospice and CY increased her pred until Monday and her xanax rx.  These have been sent to her pharmacy and pt is aware. Nothing further is needed.

## 2016-05-01 ENCOUNTER — Telehealth: Payer: Self-pay | Admitting: Internal Medicine

## 2016-05-01 MED ORDER — HYDROCOD POLST-CPM POLST ER 10-8 MG/5ML PO SUER
5.0000 mL | Freq: Two times a day (BID) | ORAL | 0 refills | Status: DC
Start: 2016-05-01 — End: 2016-07-20

## 2016-05-01 NOTE — Telephone Encounter (Signed)
Ok to refill cough syrup.  I'm no expert on psoriasis. Better that they call her PCP or Dermatologist for help with that.

## 2016-05-01 NOTE — Telephone Encounter (Signed)
Erin from Hospice calling, wants refill on Tussionex for patient.  Also, states patient is feeling a little better right now, however, Psoriasis is out of control, nothing is helping.  Any suggestions?   Dr. Maple Hudson, please advise.  Allergies  Allergen Reactions  . Augmentin [Amoxicillin-Pot Clavulanate] Nausea Only    Has patient had a PCN reaction causing immediate rash, facial/tongue/throat swelling, SOB or lightheadedness with hypotension: No Has patient had a PCN reaction causing severe rash involving mucus membranes or skin necrosis: No Has patient had a PCN reaction that required hospitalization No Has patient had a PCN reaction occurring within the last 10 years: Yes If all of the above answers are "NO", then may proceed with Cephalosporin use.   . Cefdinir Other (See Comments)    Aches, "heart fluttering"  . Doxycycline Nausea And Vomiting  . Other Nausea Only    PAIN MEDICATIONS-nausea  . Codeine Nausea Only  . Fexofenadine Nausea Only  . Hydralazine     Side effect Felt woozy   Current Outpatient Prescriptions on File Prior to Visit  Medication Sig Dispense Refill  . ADVAIR DISKUS 500-50 MCG/DOSE AEPB INHALE 1 PUFF INTO THE LUNGS 2 TIMES DAILY 60 each 3  . ALPRAZolam (XANAX) 1 MG tablet 1 tab, three times daily and PRN at bedtime 100 tablet 5  . chlorpheniramine-HYDROcodone (TUSSIONEX) 10-8 MG/5ML SUER Take 5 mLs by mouth every 12 (twelve) hours. 140 mL 0  . clarithromycin (BIAXIN) 500 MG tablet 1 twice daily after meals 20 tablet 0  . fluticasone (FLONASE) 50 MCG/ACT nasal spray PLACE 2 SPRAYS INTO BOTH NOSTRILS DAILY. 16 g 2  . guaiFENesin (MUCINEX) 600 MG 12 hr tablet Take 1 tablet (600 mg total) by mouth 2 (two) times daily. 30 tablet 0  . ibuprofen (ADVIL,MOTRIN) 200 MG tablet Take 200 mg by mouth every 6 (six) hours as needed for fever, headache, mild pain, moderate pain or cramping.    . levalbuterol (XOPENEX HFA) 45 MCG/ACT inhaler Inhale 2 puffs into the lungs every  4 (four) hours as needed for wheezing or shortness of breath. 1 Inhaler 12  . levalbuterol (XOPENEX) 1.25 MG/0.5ML nebulizer solution Take 1.25 mg by nebulization 3 (three) times daily. 1 each 12  . Morphine Sulfate (MORPHINE CONCENTRATE) 10 mg / 0.5 ml concentrated solution 2.5mg  po q12hr prn for sob 30 mL 0  . pantoprazole (PROTONIX) 20 MG tablet TAKE 1 TABLET (20 MG TOTAL) BY MOUTH DAILY BEFORE SUPPER. 30 tablet 5  . predniSONE (DELTASONE) 10 MG tablet Take 2 tablets (20 mg total) by mouth daily with breakfast. 10 tablet 0  . SPIRIVA HANDIHALER 18 MCG inhalation capsule PLACE 1 CAPSULE (18 MCG TOTAL) INTO INHALER AND INHALE DAILY. 30 capsule 4  . traZODone (DESYREL) 50 MG tablet TAKE 1 TABLET (50 MG TOTAL) BY MOUTH AT BEDTIME.  3   No current facility-administered medications on file prior to visit.

## 2016-05-01 NOTE — Telephone Encounter (Signed)
[facility] from [facility].  She is aware that pt will have to follow up with PCP or dermatology.  Nothing further is needed.

## 2016-05-02 ENCOUNTER — Telehealth: Payer: Self-pay | Admitting: Internal Medicine

## 2016-05-02 NOTE — Telephone Encounter (Signed)
Patient is requesting a letter to give to disability so she can get a mailbox by her door so she doesn't have to walk across the street to get her mail.  She would like to have the letter mailed to her once it has been created.  Dr. Maple Hudson, please advise.

## 2016-05-03 ENCOUNTER — Encounter: Payer: Self-pay | Admitting: Internal Medicine

## 2016-05-03 NOTE — Telephone Encounter (Signed)
Letter printed to mail. °

## 2016-05-03 NOTE — Telephone Encounter (Signed)
Letter done by CY.  Printed out and signed and given to [employer].  This has been placed in the mail to the pt.  I called the pt and she is aware and she stated that she will pick this up tomorrow.  Placed in the box for the pt to pick up.

## 2016-05-08 ENCOUNTER — Telehealth: Payer: Self-pay | Admitting: Internal Medicine

## 2016-05-08 DIAGNOSIS — L409 Psoriasis, unspecified: Secondary | ICD-10-CM

## 2016-05-08 DIAGNOSIS — J9611 Chronic respiratory failure with hypoxia: Secondary | ICD-10-CM

## 2016-05-08 MED ORDER — LEVALBUTEROL HCL 0.63 MG/3ML IN NEBU: INHALATION_SOLUTION | RESPIRATORY_TRACT | 12 refills | Status: AC

## 2016-05-08 NOTE — Telephone Encounter (Signed)
lmtcb X1 for Erin at [facility].

## 2016-05-08 NOTE — Telephone Encounter (Signed)
Erin from Hospice states that Xopenex needs to be resubmitted to pharmacy with instead of 0.5ML.  Rx sent to pharmacy. ------------------- Dr. Barnie Mort states that patient would like a referral to a Dermatologist that is in [facility] for Psoriasis.

## 2016-05-09 NOTE — Telephone Encounter (Signed)
Order has been placed for dermatology eval.  Nothing further is needed.

## 2016-05-09 NOTE — Telephone Encounter (Signed)
I don't know that any Dermatologists are part of [facility] or [facility]. You could ask PCCs if they have any listing.

## 2016-05-09 NOTE — Telephone Encounter (Signed)
According to the [facility] website, it looks like [facility] is within [facility].  Ok to place referral?  Thanks!

## 2016-05-09 NOTE — Telephone Encounter (Signed)
Yes - ok - thanks 

## 2016-05-10 ENCOUNTER — Telehealth: Payer: Self-pay | Admitting: Internal Medicine

## 2016-05-10 NOTE — Telephone Encounter (Signed)
Spoke with pt and she states that she has run out of Xanax early because she has been taking 4 times a day. Advised pt that rx is for PRN for sleep and NOT for 4 times a day. She asked that we call pharmacy and ask for early fill since rx was changed. Spoke with [facility] and he states that it is fine for early fill since rx was changed. He will have ready for pt to pick up. Spoke with pt and advised that rx will be ready for pick up. Also advised pt that rx is for #100 and that she needs to try to wean down the Xanax so that she does not run out early again as the pharmacy will not continue to fill it early since it is controlled substance. Pt states that she will try other things to help with sleep. Pt will call if any any further issues or concerns.

## 2016-05-14 ENCOUNTER — Other Ambulatory Visit: Payer: Self-pay | Admitting: Internal Medicine

## 2016-05-24 ENCOUNTER — Telehealth: Payer: Self-pay | Admitting: Internal Medicine

## 2016-05-24 ENCOUNTER — Ambulatory Visit (INDEPENDENT_AMBULATORY_CARE_PROVIDER_SITE_OTHER): Admitting: Internal Medicine

## 2016-05-24 ENCOUNTER — Encounter: Payer: Self-pay | Admitting: Internal Medicine

## 2016-05-24 DIAGNOSIS — J9612 Chronic respiratory failure with hypercapnia: Secondary | ICD-10-CM | POA: Diagnosis not present

## 2016-05-24 DIAGNOSIS — L409 Psoriasis, unspecified: Secondary | ICD-10-CM | POA: Diagnosis not present

## 2016-05-24 DIAGNOSIS — J449 Chronic obstructive pulmonary disease, unspecified: Secondary | ICD-10-CM

## 2016-05-24 DIAGNOSIS — J9611 Chronic respiratory failure with hypoxia: Secondary | ICD-10-CM | POA: Diagnosis not present

## 2016-05-24 DIAGNOSIS — Z23 Encounter for immunization: Secondary | ICD-10-CM

## 2016-05-24 MED ORDER — ALPRAZOLAM 1 MG PO TABS: ORAL_TABLET | ORAL | 5 refills | Status: DC

## 2016-05-24 NOTE — Telephone Encounter (Signed)
Noted  

## 2016-05-24 NOTE — Progress Notes (Signed)
Patient ID: QUINTESSA SIMMERMAN, female    DOB: [birth year], 55 y.o.   MRN: [id number] HPI  03/02/15- 44 yoF former smoker followed for COPD, tobacco cessation support, rhinitis, complicated by hx anxiety, psoriasis  FOLLOWS FOR: Pt states her breathing is unchanged since the last visit. She has been taking 5-10 mg pred since last visit. She is using proair twice per day on average and only uses neb about once per month. Oxygen 2-3 L/[facility]. She feels her breathing is more comfortable but it has been in a long time.  Psoriasis treatment changed from methotrexate to Humira. There is concern of interaction with theophylline. Not clear that theophylline has had much benefit for her lungs.  11/14/2015-55 year old female former smoker followed for COPD, tobacco cessation support, rhinitis, complicated by history anxiety, psoriasis/ Humira O2 2-3 liters/[facility] Saw NP 09/29/15- acute visit sinusitis and bronchitis. Given Z-Pak and prednisone burst, tapering back to 20 mg daily maintenance. FOLLOWS FOR: per TP visit on 09/29/15; pt states she can feel like a "cold" is coming on-started yesterday-chest congestion, cough-wet,SOB and wheezing, chills as well-no fever documented  1 month of intermittent sharp left upper anterior chest wall pain radiating to back and scapula. Maintenance prednisone now at 10 mg daily, took extra last night at onset of her cold.  03/15/2016-55 year old female former smoker followed for COPD, chronic hypoxic respiratory failure, rhinitis, complicated by anxiety, psoriasis/Humira, CHF grade 1 diastolic dysfunction O2 2-3 liters/[facility]/ BIPAP for sleep    Hospice LOV 01/24/2016-NP- FOLLOW FOR: coughing up dark gray mucus x 1 week, chest tightness, SOB (more than usual). Currently on prednisone 20 mg daily, no fever. Off Humira and psoriasis is getting worse. Using BiPAP each night with oxygen CXR 01-2016 1V- NAD  05/24/2016-55 year old female former smoker followed for  COPD/Hospice, hypoxic respiratory failure, rhinitis, complicated by anxiety, psoriasis/Humira, CHF grade 1 diastolic dysfunction BiPAP 12/8 with O2 3 L/[facility] FOLLOWS FOR: Pt states she continues to have hard time with breathing and gives out quickly. Uses her O2 through [facility].  Had questions about dose of Xopenex nebulizer solution. Doesn't tolerate albuterol-overstimulation/trigger. Ongoing anxiety-situational. Her Hospice nurse asked about increasing Xanax to 4 times daily when needed. Depends on BiPAP with oxygen for hypoxic respiratory failure while sleeping. Portable oxygen at 3 L. Pending dermatologist about her psoriasis.  Review of Systems-per HPI Constitutional:   No-   weight loss, night sweats, fevers, chills, fatigue, lassitude. HEENT:   No-  headaches, difficulty swallowing, tooth/dental problems, sore throat,       No-  sneezing, itching, ear ache, +nasal congestion, post nasal drip,  CV:  +  chest pain, orthopnea, PND, swelling in lower extremities, anasarca,  dizziness, palpitations Resp: +Persistent shortness of breath with exertion or at rest.              No-productive cough,  + non-productive cough,  No- coughing up of blood.           No-change in color of mucus.  No- wheezing.   Skin: +psoriasis rash GI:  + heartburn, No- indigestion, abdominal pain, nausea, vomiting,  GU:  MS:  No-   joint pain or swelling.   Neuro-     nothing unusual Psych:  No- change in mood or affect. No depression or anxiety.  No memory loss.   Objective:   Physical Exam General- Alert, Oriented, Affect-appropriate, Distress- none acute. O2 2 L, Wheelchair Skin- +Dry with no visible plaques Lymphadenopathy- none Head- atraumatic  Eyes- Gross vision intact, PERRLA, conjunctivae clear secretions            Ears- Hearing, canals-normal            Nose- + sniffing, turbinate edema, + External and Septal dev, mucus, No polyps, erosion,                             Throat- Mallampati  II , mucosa clear , drainage- none, tonsils- atrophic Neck- flexible , trachea midline, no stridor , thyroid nl, carotid no bruit Chest - symmetrical excursion , unlabored           Heart/CV- RRR , no murmur , no gallop  , no rub, nl s1 s2                           - JVD- none , edema- none, stasis changes- none, varices- none           Lung- +very distant, wheeze-none, cough + light, dullness-none,  rub-none.                         Chest wall-  Abd-  Br/ Gen/ Rectal- Not done, not indicated Extrem- cyanosis- none, clubbing, none, atrophy- none, strength- nl Neuro- grossly intact to observation

## 2016-05-24 NOTE — Patient Instructions (Addendum)
Script printed to allow increase xanax to 4 times daily if needed.  Flu vax  Ok to return any time after 2 weeks from now for Prevnar 13 pneumonia vaccine  Please call as needed   Continue O2 3L, adding BIPAP at night

## 2016-05-28 NOTE — Assessment & Plan Note (Signed)
Dependent on BiPAP 12/8 with oxygen for sleep. Describes good compliance with this and is supervised by hospice nurse.

## 2016-05-28 NOTE — Assessment & Plan Note (Signed)
Needs dermatology follow-up.

## 2016-05-28 NOTE — Assessment & Plan Note (Signed)
We reviewed and discussed her medications without changes for now. Plan-flu vaccine. May return after 2 weeks for Prevnar 13

## 2016-05-28 NOTE — Assessment & Plan Note (Signed)
Currently at her baseline, which is poor with chronic oxygen dependence. Only mild cough now with no acute infection.

## 2016-06-04 ENCOUNTER — Emergency Department (HOSPITAL_COMMUNITY): Payer: Medicare Other

## 2016-06-04 ENCOUNTER — Inpatient Hospital Stay (HOSPITAL_COMMUNITY)
Admission: EM | Admit: 2016-06-04 | Discharge: 2016-06-06 | DRG: 551 | Disposition: A | Discharge status: Hospice | Payer: Medicare Other | Attending: Internal Medicine | Admitting: Internal Medicine

## 2016-06-04 ENCOUNTER — Encounter (HOSPITAL_COMMUNITY): Payer: Self-pay | Admitting: Emergency Medicine

## 2016-06-04 DIAGNOSIS — Z885 Allergy status to narcotic agent status: Secondary | ICD-10-CM | POA: Diagnosis not present

## 2016-06-04 DIAGNOSIS — S064XAA Epidural hemorrhage with loss of consciousness status unknown, initial encounter: Secondary | ICD-10-CM

## 2016-06-04 DIAGNOSIS — Z7951 Long term (current) use of inhaled steroids: Secondary | ICD-10-CM

## 2016-06-04 DIAGNOSIS — Z9109 Other allergy status, other than to drugs and biological substances: Secondary | ICD-10-CM

## 2016-06-04 DIAGNOSIS — R402252 Coma scale, best verbal response, oriented, at arrival to emergency department: Secondary | ICD-10-CM | POA: Diagnosis present

## 2016-06-04 DIAGNOSIS — J449 Chronic obstructive pulmonary disease, unspecified: Secondary | ICD-10-CM | POA: Diagnosis not present

## 2016-06-04 DIAGNOSIS — R52 Pain, unspecified: Secondary | ICD-10-CM

## 2016-06-04 DIAGNOSIS — S22089A Unspecified fracture of T11-T12 vertebra, initial encounter for closed fracture: Secondary | ICD-10-CM

## 2016-06-04 DIAGNOSIS — L400 Psoriasis vulgaris: Secondary | ICD-10-CM | POA: Diagnosis not present

## 2016-06-04 DIAGNOSIS — Z515 Encounter for palliative care: Secondary | ICD-10-CM | POA: Diagnosis present

## 2016-06-04 DIAGNOSIS — Z7952 Long term (current) use of systemic steroids: Secondary | ICD-10-CM

## 2016-06-04 DIAGNOSIS — S064X9A Epidural hemorrhage with loss of consciousness of unspecified duration, initial encounter: Secondary | ICD-10-CM | POA: Diagnosis not present

## 2016-06-04 DIAGNOSIS — Z79899 Other long term (current) drug therapy: Secondary | ICD-10-CM

## 2016-06-04 DIAGNOSIS — M546 Pain in thoracic spine: Secondary | ICD-10-CM | POA: Diagnosis not present

## 2016-06-04 DIAGNOSIS — Z87891 Personal history of nicotine dependence: Secondary | ICD-10-CM | POA: Diagnosis not present

## 2016-06-04 DIAGNOSIS — R402362 Coma scale, best motor response, obeys commands, at arrival to emergency department: Secondary | ICD-10-CM | POA: Diagnosis not present

## 2016-06-04 DIAGNOSIS — R339 Retention of urine, unspecified: Secondary | ICD-10-CM | POA: Diagnosis not present

## 2016-06-04 DIAGNOSIS — F1020 Alcohol dependence, uncomplicated: Secondary | ICD-10-CM | POA: Diagnosis present

## 2016-06-04 DIAGNOSIS — Z9981 Dependence on supplemental oxygen: Secondary | ICD-10-CM

## 2016-06-04 DIAGNOSIS — T148 Other injury of unspecified body region: Secondary | ICD-10-CM | POA: Diagnosis not present

## 2016-06-04 DIAGNOSIS — S22081A Stable burst fracture of T11-T12 vertebra, initial encounter for closed fracture: Principal | ICD-10-CM | POA: Diagnosis present

## 2016-06-04 DIAGNOSIS — R402142 Coma scale, eyes open, spontaneous, at arrival to emergency department: Secondary | ICD-10-CM | POA: Diagnosis present

## 2016-06-04 DIAGNOSIS — F329 Major depressive disorder, single episode, unspecified: Secondary | ICD-10-CM | POA: Diagnosis present

## 2016-06-04 DIAGNOSIS — S22081D Stable burst fracture of T11-T12 vertebra, subsequent encounter for fracture with routine healing: Secondary | ICD-10-CM | POA: Diagnosis not present

## 2016-06-04 DIAGNOSIS — S064X0A Epidural hemorrhage without loss of consciousness, initial encounter: Secondary | ICD-10-CM | POA: Diagnosis present

## 2016-06-04 DIAGNOSIS — Z880 Allergy status to penicillin: Secondary | ICD-10-CM | POA: Diagnosis not present

## 2016-06-04 LAB — I-STAT CHEM 8, ED
BUN: 11 mg/dL (ref 6–20)
CALCIUM ION: 1.1 mmol/L — AB (ref 1.15–1.40)
Chloride: 89 mmol/L — ABNORMAL LOW (ref 101–111)
Creatinine, Ser: 0.8 mg/dL (ref 0.44–1.00)
GLUCOSE: 92 mg/dL (ref 65–99)
HEMATOCRIT: 37 % (ref 36.0–46.0)
HEMOGLOBIN: 12.6 g/dL (ref 12.0–15.0)
Potassium: 4.1 mmol/L (ref 3.5–5.1)
Sodium: 137 mmol/L (ref 135–145)
TCO2: 37 mmol/L (ref 0–100)

## 2016-06-04 LAB — BASIC METABOLIC PANEL
ANION GAP: 3 — AB (ref 5–15)
BUN: 6 mg/dL (ref 6–20)
CALCIUM: 5.1 mg/dL — AB (ref 8.9–10.3)
CO2: 23 mmol/L (ref 22–32)
Chloride: 116 mmol/L — ABNORMAL HIGH (ref 101–111)
Creatinine, Ser: 0.36 mg/dL — ABNORMAL LOW (ref 0.44–1.00)
GFR calc Af Amer: >60 mL/min (ref >60)
Glucose, Bld: 66 mg/dL (ref 65–99)
POTASSIUM: 2.3 mmol/L — AB (ref 3.5–5.1)
SODIUM: 142 mmol/L (ref 135–145)

## 2016-06-04 LAB — CBC WITH DIFFERENTIAL/PLATELET
BASOS ABS: 0 10*3/uL (ref 0.0–0.1)
BASOS PCT: 0 %
EOS PCT: 0 %
Eosinophils Absolute: 0 10*3/uL (ref 0.0–0.7)
HCT: 20.8 % — ABNORMAL LOW (ref 36.0–46.0)
Hemoglobin: 6.2 g/dL — CL (ref 12.0–15.0)
LYMPHS PCT: 6 %
Lymphs Abs: 0.5 10*3/uL — ABNORMAL LOW (ref 0.7–4.0)
MCH: 27.4 pg (ref 26.0–34.0)
MCHC: 29.8 g/dL — ABNORMAL LOW (ref 30.0–36.0)
MCV: 92 fL (ref 78.0–100.0)
Monocytes Absolute: 0.4 10*3/uL (ref 0.1–1.0)
Monocytes Relative: 5 %
Neutro Abs: 7.6 10*3/uL (ref 1.7–7.7)
Neutrophils Relative %: 89 %
PLATELETS: 167 10*3/uL (ref 150–400)
RBC: 2.26 MIL/uL — AB (ref 3.87–5.11)
RDW: 13.7 % (ref 11.5–15.5)
WBC: 8.5 10*3/uL (ref 4.0–10.5)

## 2016-06-04 LAB — HEMOGLOBIN AND HEMATOCRIT, BLOOD
HCT: 36.9 % (ref 36.0–46.0)
HEMOGLOBIN: 11.3 g/dL — AB (ref 12.0–15.0)

## 2016-06-04 LAB — TYPE AND SCREEN
ABO/RH(D): A POS
ANTIBODY SCREEN: NEGATIVE

## 2016-06-04 LAB — ABO/RH: ABO/RH(D): A POS

## 2016-06-04 MED ORDER — IOPAMIDOL (ISOVUE-300) INJECTION 61%
INTRAVENOUS | Status: AC
  Administered 2016-06-04: 100 mL
  Filled 2016-06-04: qty 100

## 2016-06-04 MED ORDER — ALBUTEROL SULFATE (2.5 MG/3ML) 0.083% IN NEBU: 2.5000 mg | INHALATION_SOLUTION | RESPIRATORY_TRACT | Status: DC | PRN

## 2016-06-04 MED ORDER — PREDNISONE 20 MG PO TABS
20.0000 mg | ORAL_TABLET | Freq: Every day | ORAL | Status: DC
  Administered 2016-06-05 – 2016-06-06 (×2): 20 mg via ORAL
  Filled 2016-06-04 (×2): qty 1

## 2016-06-04 MED ORDER — POTASSIUM CHLORIDE 10 MEQ/100ML IV SOLN: 10.0000 meq | Freq: Once | INTRAVENOUS | Status: DC

## 2016-06-04 MED ORDER — MORPHINE SULFATE (CONCENTRATE) 10 MG/0.5ML PO SOLN: 2.5000 mg | Freq: Four times a day (QID) | ORAL | Status: DC | PRN

## 2016-06-04 MED ORDER — MORPHINE SULFATE (PF) 2 MG/ML IV SOLN
2.0000 mg | Freq: Once | INTRAVENOUS | Status: AC
  Administered 2016-06-04: 2 mg via INTRAVENOUS
  Filled 2016-06-04: qty 1

## 2016-06-04 MED ORDER — MORPHINE SULFATE (PF) 4 MG/ML IV SOLN
4.0000 mg | Freq: Once | INTRAVENOUS | Status: AC
Start: 2016-06-04 — End: 2016-06-04
  Administered 2016-06-04: 4 mg via INTRAVENOUS
  Filled 2016-06-04: qty 1

## 2016-06-04 MED ORDER — FENTANYL CITRATE (PF) 100 MCG/2ML IJ SOLN
50.0000 ug | Freq: Once | INTRAMUSCULAR | Status: AC
  Administered 2016-06-04: 50 ug via INTRAVENOUS
  Filled 2016-06-04: qty 2

## 2016-06-04 MED ORDER — ONDANSETRON HCL 4 MG/2ML IJ SOLN
4.0000 mg | Freq: Once | INTRAMUSCULAR | Status: AC
  Administered 2016-06-04: 4 mg via INTRAVENOUS
  Filled 2016-06-04: qty 2

## 2016-06-04 MED ORDER — TRAZODONE HCL 50 MG PO TABS
50.0000 mg | ORAL_TABLET | Freq: Every day | ORAL | Status: DC
  Administered 2016-06-05: 50 mg via ORAL
  Filled 2016-06-04 (×2): qty 1

## 2016-06-04 MED ORDER — NALOXONE HCL 0.4 MG/ML IJ SOLN: 0.4000 mg | INTRAMUSCULAR | Status: DC | PRN

## 2016-06-04 MED ORDER — MOMETASONE FURO-FORMOTEROL FUM 200-5 MCG/ACT IN AERO
2.0000 | INHALATION_SPRAY | Freq: Two times a day (BID) | RESPIRATORY_TRACT | Status: DC
  Administered 2016-06-05 – 2016-06-06 (×3): 2 via RESPIRATORY_TRACT
  Filled 2016-06-04: qty 8.8

## 2016-06-04 MED ORDER — POTASSIUM CHLORIDE CRYS ER 20 MEQ PO TBCR: 40.0000 meq | EXTENDED_RELEASE_TABLET | Freq: Once | ORAL | Status: DC

## 2016-06-04 MED ORDER — MORPHINE SULFATE (PF) 4 MG/ML IV SOLN
4.0000 mg | Freq: Once | INTRAVENOUS | Status: AC
  Administered 2016-06-04: 4 mg via INTRAVENOUS
  Filled 2016-06-04: qty 1

## 2016-06-04 MED ORDER — SODIUM CHLORIDE 0.9% FLUSH: 9.0000 mL | INTRAVENOUS | Status: DC | PRN

## 2016-06-04 MED ORDER — DIPHENHYDRAMINE HCL 12.5 MG/5ML PO ELIX: 12.5000 mg | ORAL_SOLUTION | Freq: Four times a day (QID) | ORAL | Status: DC | PRN

## 2016-06-04 MED ORDER — MORPHINE SULFATE (CONCENTRATE) 10 MG /0.5 ML PO SOLN: 2.5000 mg | Freq: Four times a day (QID) | ORAL | Status: DC | PRN

## 2016-06-04 MED ORDER — MAGNESIUM SULFATE 2 GM/50ML IV SOLN: 2.0000 g | Freq: Once | INTRAVENOUS | Status: DC

## 2016-06-04 MED ORDER — LORAZEPAM 2 MG/ML IJ SOLN
1.0000 mg | Freq: Once | INTRAMUSCULAR | Status: AC
  Administered 2016-06-04: 1 mg via INTRAVENOUS
  Filled 2016-06-04: qty 1

## 2016-06-04 MED ORDER — MORPHINE SULFATE (PF) 4 MG/ML IV SOLN: 4.0000 mg | Freq: Once | INTRAVENOUS | Status: DC

## 2016-06-04 MED ORDER — CYCLOBENZAPRINE HCL 10 MG PO TABS: 5.0000 mg | ORAL_TABLET | Freq: Once | ORAL | Status: DC

## 2016-06-04 MED ORDER — FENTANYL CITRATE (PF) 100 MCG/2ML IJ SOLN
50.0000 ug | Freq: Once | INTRAMUSCULAR | Status: DC
  Filled 2016-06-04: qty 2

## 2016-06-04 MED ORDER — LEVALBUTEROL HCL 0.63 MG/3ML IN NEBU: 0.6300 mg | INHALATION_SOLUTION | RESPIRATORY_TRACT | Status: DC | PRN

## 2016-06-04 MED ORDER — DIPHENHYDRAMINE HCL 50 MG/ML IJ SOLN
12.5000 mg | Freq: Four times a day (QID) | INTRAMUSCULAR | Status: DC | PRN
  Filled 2016-06-04: qty 1

## 2016-06-04 MED ORDER — GADOBENATE DIMEGLUMINE 529 MG/ML IV SOLN
13.0000 mL | Freq: Once | INTRAVENOUS | Status: AC | PRN
  Administered 2016-06-04: 13 mL via INTRAVENOUS

## 2016-06-04 MED ORDER — FENTANYL CITRATE (PF) 100 MCG/2ML IJ SOLN: 50.0000 ug | Freq: Once | INTRAMUSCULAR | Status: DC

## 2016-06-04 MED ORDER — MORPHINE SULFATE 2 MG/ML IV SOLN
INTRAVENOUS | Status: DC
  Administered 2016-06-05: 3 mg via INTRAVENOUS
  Filled 2016-06-04 (×2): qty 25

## 2016-06-04 MED ORDER — MORPHINE SULFATE 10 MG/5ML PO SOLN: 2.5000 mg | Freq: Four times a day (QID) | ORAL | Status: DC | PRN

## 2016-06-04 MED ORDER — PANTOPRAZOLE SODIUM 20 MG PO TBEC
20.0000 mg | DELAYED_RELEASE_TABLET | Freq: Every day | ORAL | Status: DC
  Administered 2016-06-05: 20 mg via ORAL
  Filled 2016-06-04: qty 1

## 2016-06-04 MED ORDER — ALPRAZOLAM 0.5 MG PO TABS
1.0000 mg | ORAL_TABLET | Freq: Three times a day (TID) | ORAL | Status: DC | PRN
  Administered 2016-06-05 – 2016-06-06 (×4): 1 mg via ORAL
  Filled 2016-06-04 (×4): qty 2

## 2016-06-04 MED ORDER — ONDANSETRON HCL 4 MG/2ML IJ SOLN: 4.0000 mg | Freq: Four times a day (QID) | INTRAMUSCULAR | Status: DC | PRN

## 2016-06-04 MED ORDER — FLUTICASONE PROPIONATE 50 MCG/ACT NA SUSP
2.0000 | Freq: Every day | NASAL | Status: DC
  Filled 2016-06-04: qty 16

## 2016-06-04 MED ORDER — TIOTROPIUM BROMIDE MONOHYDRATE 18 MCG IN CAPS
18.0000 ug | ORAL_CAPSULE | Freq: Every day | RESPIRATORY_TRACT | Status: DC
  Administered 2016-06-05 – 2016-06-06 (×2): 18 ug via RESPIRATORY_TRACT
  Filled 2016-06-04: qty 5

## 2016-06-04 NOTE — ED Notes (Signed)
Patient transported to CT 

## 2016-06-04 NOTE — ED Notes (Addendum)
PA at bedside. PA made aware of hemoglobin 6.2.

## 2016-06-04 NOTE — ED Notes (Signed)
Joselyn Glassman, [place], made aware pt requesting more pain meds.

## 2016-06-04 NOTE — ED Notes (Signed)
Patient transported to MRI 
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
## 2016-06-04 NOTE — ED Notes (Signed)
Pt. returned from XR. 

## 2016-06-04 NOTE — ED Provider Notes (Addendum)
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 06/04/2016 14:49      Charlynne Pander, MD 06/04/16 2214    Charlynne Pander, MD 06/05/16 [phone]
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
## 2016-06-05 DIAGNOSIS — S064XAA Epidural hemorrhage with loss of consciousness status unknown, initial encounter: Secondary | ICD-10-CM

## 2016-06-05 DIAGNOSIS — R52 Pain, unspecified: Secondary | ICD-10-CM

## 2016-06-05 DIAGNOSIS — S064X9A Epidural hemorrhage with loss of consciousness of unspecified duration, initial encounter: Secondary | ICD-10-CM

## 2016-06-05 MED ORDER — HYDROCORTISONE 1 % EX CREA
TOPICAL_CREAM | CUTANEOUS | Status: DC | PRN
  Administered 2016-06-05 – 2016-06-06 (×2): 1 via TOPICAL
  Filled 2016-06-05 (×2): qty 28

## 2016-06-05 MED ORDER — MORPHINE SULFATE (CONCENTRATE) 10 MG/0.5ML PO SOLN: 2.5000 mg | ORAL | Status: DC | PRN

## 2016-06-05 MED ORDER — KETOROLAC TROMETHAMINE 30 MG/ML IJ SOLN
30.0000 mg | Freq: Four times a day (QID) | INTRAMUSCULAR | Status: DC
Start: 2016-06-05 — End: 2016-06-06
  Administered 2016-06-05 – 2016-06-06 (×5): 30 mg via INTRAVENOUS
  Filled 2016-06-05 (×7): qty 1

## 2016-06-05 MED ORDER — INFLUENZA VAC SPLIT QUAD 0.5 ML IM SUSY: 0.5000 mL | PREFILLED_SYRINGE | INTRAMUSCULAR | Status: DC

## 2016-06-05 MED ORDER — MORPHINE SULFATE (CONCENTRATE) 10 MG/0.5ML PO SOLN
10.0000 mg | Freq: Four times a day (QID) | ORAL | Status: DC | PRN
  Administered 2016-06-05 – 2016-06-06 (×4): 10 mg via SUBLINGUAL
  Filled 2016-06-05 (×4): qty 0.5

## 2016-06-05 MED ORDER — ORAL CARE MOUTH RINSE
15.0000 mL | Freq: Two times a day (BID) | OROMUCOSAL | Status: DC
  Administered 2016-06-05 – 2016-06-06 (×3): 15 mL via OROMUCOSAL
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
## 2016-06-05 NOTE — Progress Notes (Addendum)
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 [facility] 06/05/2016, 9:55 AM  LOS: 1 day

## 2016-06-05 NOTE — Progress Notes (Signed)
Patient wears BiPAP QHS at home, however, patient currently has a PCA pump in place for back pain. RT explained to patient she would not get a good seal with her BiPAP and the machine would alarm all night. Patient was fine with only wearing the cannula and will have the nurse call if she starts experiencing any problems. RT will continue to monitor.
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
## 2016-06-05 NOTE — Care Management Note (Addendum)
Case Management Note  Patient Details  Name: PANSEY PINHEIRO MRN: [id number] Date of Birth: May 16, 1961  Subjective/Objective:     CM following for progression and d/c planning.               Action/Plan: 06/05/2016 Notified [facility] of pt admission await return call from Hospice rep.  Spoke with [facility] rep Lyndle Herrlich re this pt and plan to d/c to home via ambulance. Hospice to continue [facility] services. Strongly discouraging pt from attempts to drive in the future.  No DME needs or further [facility] services.   Expected Discharge Date:   06/06/2016               Expected Discharge Plan:  Home w Hospice Care  In-House Referral:  NA  Discharge planning Services  NA  Post Acute Care Choice:   NA Choice offered to:     DME Arranged:   NA DME Agency:   NA  HH Arranged:   NA HH Agency:   NA  Status of Service:  In process, will continue to follow  If discussed at Long Length of Stay Meetings, dates discussed:    Additional Comments:  Starlyn Skeans, RN 06/05/2016, 11:36 AM

## 2016-06-06 ENCOUNTER — Telehealth: Payer: Self-pay

## 2016-06-06 DIAGNOSIS — J449 Chronic obstructive pulmonary disease, unspecified: Secondary | ICD-10-CM

## 2016-06-06 DIAGNOSIS — R52 Pain, unspecified: Secondary | ICD-10-CM

## 2016-06-06 DIAGNOSIS — S22081D Stable burst fracture of T11-T12 vertebra, subsequent encounter for fracture with routine healing: Secondary | ICD-10-CM

## 2016-06-06 LAB — CBC
HEMATOCRIT: 34.7 % — AB (ref 36.0–46.0)
HEMOGLOBIN: 10.2 g/dL — AB (ref 12.0–15.0)
MCH: 27.2 pg (ref 26.0–34.0)
MCHC: 29.4 g/dL — ABNORMAL LOW (ref 30.0–36.0)
MCV: 92.5 fL (ref 78.0–100.0)
Platelets: 241 10*3/uL (ref 150–400)
RBC: 3.75 MIL/uL — ABNORMAL LOW (ref 3.87–5.11)
RDW: 13.8 % (ref 11.5–15.5)
WBC: 11.4 10*3/uL — AB (ref 4.0–10.5)

## 2016-06-06 LAB — COMPREHENSIVE METABOLIC PANEL
ALBUMIN: 3.4 g/dL — AB (ref 3.5–5.0)
ALK PHOS: 41 U/L (ref 38–126)
ALT: 18 U/L (ref 14–54)
AST: 17 U/L (ref 15–41)
Anion gap: 9 (ref 5–15)
BILIRUBIN TOTAL: 0.7 mg/dL (ref 0.3–1.2)
BUN: 12 mg/dL (ref 6–20)
CALCIUM: 8.4 mg/dL — AB (ref 8.9–10.3)
CO2: 33 mmol/L — ABNORMAL HIGH (ref 22–32)
CREATININE: 0.92 mg/dL (ref 0.44–1.00)
Chloride: 89 mmol/L — ABNORMAL LOW (ref 101–111)
GFR calc Af Amer: >60 mL/min (ref >60)
GFR calc non Af Amer: >60 mL/min (ref >60)
GLUCOSE: 131 mg/dL — AB (ref 65–99)
Potassium: 4.1 mmol/L (ref 3.5–5.1)
Sodium: 131 mmol/L — ABNORMAL LOW (ref 135–145)
TOTAL PROTEIN: 5.9 g/dL — AB (ref 6.5–8.1)

## 2016-06-06 MED ORDER — MORPHINE SULFATE (CONCENTRATE) 10 MG /0.5 ML PO SOLN: 2.5000 mg | Freq: Four times a day (QID) | ORAL | 0 refills | Status: AC | PRN
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
## 2016-06-06 NOTE — Telephone Encounter (Signed)
[name] returning our call - she can be reached at [phone]-pr
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
## 2016-06-06 NOTE — Telephone Encounter (Signed)
LVM for Wendy to return call.  

## 2016-06-06 NOTE — Telephone Encounter (Signed)
LVM for Wendy [name] to return call.

## 2016-06-06 NOTE — Telephone Encounter (Signed)
Ok to order "referral to Physical Therapy to evaluate and treat" for dx COPD mixed type, chronic respiratory failure with hypoxia

## 2016-06-06 NOTE — Care Management Note (Addendum)
Case Management Note  Patient Details  Name: KINSIE BELFORD MRN: [id number] Date of Birth: 04-04-1961  Subjective/Objective:      CM following for progression and d/c planning.               Action/Plan: 06/06/2016 Met with pt and hospice RN, Etter Sjogren. Pt will d/c to home today by ambulance. Form complete and placed on chart. Pt RN Lambert Keto to notify this CM when she is ready for pt to d/c. Pt has oxygen at home and ambulance will provide during transport. No DME or other needs as pt is active with [facility] for all needs.  Hospice will determine if HHPT is appropriate for this pt and make arrangement for this services.  Expected Discharge Date:    06/06/2016              Expected Discharge Plan:  Home w Hospice Care  In-House Referral:  NA  Discharge planning Services  CM Consult  Post Acute Care Choice:  Hospice Choice offered to:  Patient  DME Arranged:  N/A DME Agency:  NA  HH Arranged:  RN, NA, Social Work [employer] Agency:  Hospice and [place] of Service:  Completed, signed off  If discussed at [employer], dates discussed:    Additional Comments:  Adron Bene, RN 06/06/2016, 11:07 AM

## 2016-06-06 NOTE — Telephone Encounter (Signed)
Called spoke with [name]. She states that she needs a verbal order from [facility] for PT. I explained to her that CY is out of the office this afternoon and that I will send a message for him to address this at his return. She voiced understanding and had no further questions.  CY please advise

## 2016-06-06 NOTE — Progress Notes (Signed)
Wendy [name] to be D/C'd Home per MD order.  Discussed prescriptions and follow up appointments with the patient. Prescriptions given to patient, medication list explained in detail. Pt verbalized understanding.    Medication List    TAKE these medications   ADVAIR DISKUS 500-50 MCG/DOSE Aepb Generic drug:  Fluticasone-Salmeterol INHALE 1 PUFF INTO THE LUNGS 2 TIMES DAILY   ALPRAZolam 1 MG tablet Commonly known as:  XANAX 1 tab, three times daily and PRN at bedtime   chlorpheniramine-HYDROcodone 10-8 MG/5ML Suer Commonly known as:  TUSSIONEX Take 5 mLs by mouth every 12 (twelve) hours.   fluticasone 50 MCG/ACT nasal spray Commonly known as:  FLONASE PLACE 2 SPRAYS INTO BOTH NOSTRILS DAILY.   ibuprofen 200 MG tablet Commonly known as:  ADVIL,MOTRIN Take 200 mg by mouth every 6 (six) hours as needed for fever, headache, mild pain, moderate pain or cramping.   levalbuterol 0.63 MG/3ML nebulizer solution Commonly known as:  XOPENEX Take 69ml every 4 hours and as needed   levalbuterol 45 MCG/ACT inhaler Commonly known as:  XOPENEX HFA Inhale 2 puffs into the lungs every 4 (four) hours as needed for wheezing or shortness of breath.   morphine CONCENTRATE 10 mg / 0.5 ml concentrated solution Take 0.13 mLs (2.6 mg total) by mouth every 6 (six) hours as needed for moderate pain. 2.5mg  po q6h prn for sob What changed:  how much to take  how to take this  when to take this  reasons to take this  additional instructions   pantoprazole 20 MG tablet Commonly known as:  PROTONIX TAKE 1 TABLET (20 MG TOTAL) BY MOUTH DAILY BEFORE SUPPER.   predniSONE 10 MG tablet Commonly known as:  DELTASONE Take 2 tablets (20 mg total) by mouth daily with breakfast.   SPIRIVA HANDIHALER 18 MCG inhalation capsule Generic drug:  tiotropium PLACE 1 CAPSULE (18 MCG TOTAL) INTO INHALER AND INHALE DAILY.   traZODone 50 MG tablet Commonly known as:  DESYREL TAKE 1 TABLET (50 MG TOTAL) BY  MOUTH AT BEDTIME.       Vitals:   06/06/16 0651 06/06/16 0822  BP: 118/66 133/60  Pulse: 99 85  Resp: 16 14  Temp: 98.4 F (36.9 C) 98.6 F (37 C)    Skin clean, dry and intact without evidence of skin break down, no evidence of skin tears noted. IV catheter discontinued intact. Site without signs and symptoms of complications. Dressing and pressure applied. Pt denies pain at this time. No complaints noted.  An After Visit Summary was printed and given to the patient. Patient escorted via WC, and D/C home via Abulance  Mariann Barter BSN, RN [facility] Phone [phone]

## 2016-06-06 NOTE — Telephone Encounter (Signed)
lmtcb for Wendy [name] with hospice
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
## 2016-06-07 NOTE — Telephone Encounter (Signed)
[name] from hospice returned call. Reviewed CY's recs. She voiced understanding and had no further questions. Nothing further needed.

## 2016-06-08 ENCOUNTER — Other Ambulatory Visit: Payer: Self-pay | Admitting: Family Medicine

## 2016-06-08 ENCOUNTER — Other Ambulatory Visit: Payer: Self-pay | Admitting: Internal Medicine

## 2016-06-08 DIAGNOSIS — J449 Chronic obstructive pulmonary disease, unspecified: Secondary | ICD-10-CM

## 2016-06-11 DIAGNOSIS — M4856XA Collapsed vertebra, not elsewhere classified, lumbar region, initial encounter for fracture: Secondary | ICD-10-CM | POA: Diagnosis not present

## 2016-06-13 ENCOUNTER — Telehealth: Payer: Self-pay | Admitting: Family Medicine

## 2016-06-13 DIAGNOSIS — S22080S Wedge compression fracture of T11-T12 vertebra, sequela: Secondary | ICD-10-CM
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
## 2016-06-19 MED ORDER — VITAMIN D (ERGOCALCIFEROL) 1.25 MG (50000 UNIT) PO CAPS: 50000.0000 [IU] | ORAL_CAPSULE | ORAL | Status: DC

## 2016-06-19 MED ORDER — CALCIUM CITRATE 200 MG PO TABS: 400.0000 mg | ORAL_TABLET | Freq: Every day | ORAL | 5 refills | Status: DC
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
## 2016-06-20 NOTE — Telephone Encounter (Signed)
Pt was called and informed of ortho referral and medications being sent to his pharmacy.

## 2016-06-27 DIAGNOSIS — S32010A Wedge compression fracture of first lumbar vertebra, initial encounter for closed fracture: Secondary | ICD-10-CM | POA: Diagnosis not present

## 2016-07-02 ENCOUNTER — Inpatient Hospital Stay: Admitting: Family Medicine

## 2016-07-03 ENCOUNTER — Other Ambulatory Visit: Payer: Self-pay | Admitting: Internal Medicine

## 2016-07-03 ENCOUNTER — Telehealth: Payer: Self-pay | Admitting: Internal Medicine

## 2016-07-03 MED ORDER — FLUTICASONE-SALMETEROL 500-50 MCG/DOSE IN AEPB: INHALATION_SPRAY | RESPIRATORY_TRACT | 0 refills | Status: AC

## 2016-07-03 NOTE — Telephone Encounter (Signed)
Patient calling to get refill on Advair.  Rx sent to pharmacy. Patient aware. Nothing further needed.

## 2016-07-17 ENCOUNTER — Inpatient Hospital Stay (HOSPITAL_COMMUNITY)
Admission: EM | Admit: 2016-07-17 | Discharge: 2016-07-23 | DRG: 871 | Disposition: A | Discharge status: Hospice | Attending: Internal Medicine | Admitting: Internal Medicine

## 2016-07-17 ENCOUNTER — Emergency Department (HOSPITAL_COMMUNITY)

## 2016-07-17 ENCOUNTER — Encounter (HOSPITAL_COMMUNITY): Payer: Self-pay | Admitting: Emergency Medicine

## 2016-07-17 DIAGNOSIS — Z7952 Long term (current) use of systemic steroids: Secondary | ICD-10-CM

## 2016-07-17 DIAGNOSIS — F419 Anxiety disorder, unspecified: Secondary | ICD-10-CM | POA: Diagnosis present

## 2016-07-17 DIAGNOSIS — I160 Hypertensive urgency: Secondary | ICD-10-CM | POA: Diagnosis present

## 2016-07-17 DIAGNOSIS — Z801 Family history of malignant neoplasm of trachea, bronchus and lung: Secondary | ICD-10-CM

## 2016-07-17 DIAGNOSIS — R791 Abnormal coagulation profile: Secondary | ICD-10-CM | POA: Diagnosis present

## 2016-07-17 DIAGNOSIS — R6520 Severe sepsis without septic shock: Secondary | ICD-10-CM | POA: Diagnosis present

## 2016-07-17 DIAGNOSIS — G9340 Encephalopathy, unspecified: Secondary | ICD-10-CM | POA: Diagnosis present

## 2016-07-17 DIAGNOSIS — Z825 Family history of asthma and other chronic lower respiratory diseases: Secondary | ICD-10-CM

## 2016-07-17 DIAGNOSIS — R531 Weakness: Secondary | ICD-10-CM | POA: Diagnosis not present

## 2016-07-17 DIAGNOSIS — K219 Gastro-esophageal reflux disease without esophagitis: Secondary | ICD-10-CM | POA: Diagnosis present

## 2016-07-17 DIAGNOSIS — Z9981 Dependence on supplemental oxygen: Secondary | ICD-10-CM

## 2016-07-17 DIAGNOSIS — Z7189 Other specified counseling: Secondary | ICD-10-CM

## 2016-07-17 DIAGNOSIS — R410 Disorientation, unspecified: Secondary | ICD-10-CM

## 2016-07-17 DIAGNOSIS — J9611 Chronic respiratory failure with hypoxia: Secondary | ICD-10-CM | POA: Diagnosis present

## 2016-07-17 DIAGNOSIS — N179 Acute kidney failure, unspecified: Secondary | ICD-10-CM

## 2016-07-17 DIAGNOSIS — J9621 Acute and chronic respiratory failure with hypoxia: Secondary | ICD-10-CM | POA: Diagnosis present

## 2016-07-17 DIAGNOSIS — I5032 Chronic diastolic (congestive) heart failure: Secondary | ICD-10-CM | POA: Diagnosis present

## 2016-07-17 DIAGNOSIS — B379 Candidiasis, unspecified: Secondary | ICD-10-CM | POA: Diagnosis present

## 2016-07-17 DIAGNOSIS — R0602 Shortness of breath: Secondary | ICD-10-CM | POA: Diagnosis not present

## 2016-07-17 DIAGNOSIS — J9622 Acute and chronic respiratory failure with hypercapnia: Secondary | ICD-10-CM | POA: Diagnosis present

## 2016-07-17 DIAGNOSIS — I471 Supraventricular tachycardia: Secondary | ICD-10-CM | POA: Diagnosis present

## 2016-07-17 DIAGNOSIS — Z880 Allergy status to penicillin: Secondary | ICD-10-CM

## 2016-07-17 DIAGNOSIS — T360X5A Adverse effect of penicillins, initial encounter: Secondary | ICD-10-CM | POA: Diagnosis present

## 2016-07-17 DIAGNOSIS — G4733 Obstructive sleep apnea (adult) (pediatric): Secondary | ICD-10-CM | POA: Diagnosis present

## 2016-07-17 DIAGNOSIS — Z66 Do not resuscitate: Secondary | ICD-10-CM | POA: Diagnosis present

## 2016-07-17 DIAGNOSIS — A419 Sepsis, unspecified organism: Secondary | ICD-10-CM | POA: Diagnosis not present

## 2016-07-17 DIAGNOSIS — R443 Hallucinations, unspecified: Secondary | ICD-10-CM | POA: Diagnosis not present

## 2016-07-17 DIAGNOSIS — Z515 Encounter for palliative care: Secondary | ICD-10-CM | POA: Diagnosis present

## 2016-07-17 DIAGNOSIS — R404 Transient alteration of awareness: Secondary | ICD-10-CM | POA: Diagnosis not present

## 2016-07-17 DIAGNOSIS — E860 Dehydration: Secondary | ICD-10-CM | POA: Diagnosis present

## 2016-07-17 DIAGNOSIS — Z87891 Personal history of nicotine dependence: Secondary | ICD-10-CM

## 2016-07-17 DIAGNOSIS — J441 Chronic obstructive pulmonary disease with (acute) exacerbation: Secondary | ICD-10-CM | POA: Diagnosis present

## 2016-07-17 DIAGNOSIS — F329 Major depressive disorder, single episode, unspecified: Secondary | ICD-10-CM | POA: Diagnosis present

## 2016-07-17 DIAGNOSIS — G8929 Other chronic pain: Secondary | ICD-10-CM | POA: Diagnosis present

## 2016-07-17 DIAGNOSIS — J440 Chronic obstructive pulmonary disease with acute lower respiratory infection: Secondary | ICD-10-CM | POA: Diagnosis present

## 2016-07-17 DIAGNOSIS — Y95 Nosocomial condition: Secondary | ICD-10-CM | POA: Diagnosis present

## 2016-07-17 DIAGNOSIS — R0902 Hypoxemia: Secondary | ICD-10-CM

## 2016-07-17 DIAGNOSIS — J189 Pneumonia, unspecified organism: Secondary | ICD-10-CM | POA: Diagnosis present

## 2016-07-17 DIAGNOSIS — E871 Hypo-osmolality and hyponatremia: Secondary | ICD-10-CM | POA: Diagnosis present

## 2016-07-17 DIAGNOSIS — Z22322 Carrier or suspected carrier of Methicillin resistant Staphylococcus aureus: Secondary | ICD-10-CM

## 2016-07-17 LAB — CBC WITH DIFFERENTIAL/PLATELET
BASOS PCT: 0 %
Basophils Absolute: 0 10*3/uL (ref 0.0–0.1)
EOS ABS: 0 10*3/uL (ref 0.0–0.7)
EOS PCT: 0 %
HCT: 37.8 % (ref 36.0–46.0)
HEMOGLOBIN: 12.1 g/dL (ref 12.0–15.0)
LYMPHS PCT: 5 %
Lymphs Abs: 1.6 10*3/uL (ref 0.7–4.0)
MCH: 28.1 pg (ref 26.0–34.0)
MCHC: 32 g/dL (ref 30.0–36.0)
MCV: 87.9 fL (ref 78.0–100.0)
MONO ABS: 1.9 10*3/uL — AB (ref 0.1–1.0)
Monocytes Relative: 6 %
NEUTROS PCT: 89 %
Neutro Abs: 28.6 10*3/uL — ABNORMAL HIGH (ref 1.7–7.7)
PLATELETS: 288 10*3/uL (ref 150–400)
RBC: 4.3 MIL/uL (ref 3.87–5.11)
RDW: 13.6 % (ref 11.5–15.5)
WBC: 32.1 10*3/uL — AB (ref 4.0–10.5)

## 2016-07-17 LAB — COMPREHENSIVE METABOLIC PANEL
ALT: 14 U/L (ref 14–54)
ANION GAP: 11 (ref 5–15)
AST: 20 U/L (ref 15–41)
Albumin: 3.8 g/dL (ref 3.5–5.0)
Alkaline Phosphatase: 54 U/L (ref 38–126)
BUN: 17 mg/dL (ref 6–20)
CALCIUM: 8.6 mg/dL — AB (ref 8.9–10.3)
CHLORIDE: 84 mmol/L — AB (ref 101–111)
CO2: 35 mmol/L — AB (ref 22–32)
CREATININE: 1.18 mg/dL — AB (ref 0.44–1.00)
GFR, EST AFRICAN AMERICAN: 58 mL/min — AB (ref >60)
GFR, EST NON AFRICAN AMERICAN: 50 mL/min — AB (ref >60)
Glucose, Bld: 91 mg/dL (ref 65–99)
Potassium: 4 mmol/L (ref 3.5–5.1)
SODIUM: 130 mmol/L — AB (ref 135–145)
Total Bilirubin: 1.3 mg/dL — ABNORMAL HIGH (ref 0.3–1.2)
Total Protein: 7.4 g/dL (ref 6.5–8.1)

## 2016-07-17 LAB — I-STAT CG4 LACTIC ACID, ED: Lactic Acid, Venous: 2.16 mmol/L (ref 0.5–1.9)

## 2016-07-17 MED ORDER — SODIUM CHLORIDE 0.9 % IV BOLUS (SEPSIS)
1000.0000 mL | Freq: Once | INTRAVENOUS | Status: AC
  Administered 2016-07-17: 1000 mL via INTRAVENOUS

## 2016-07-17 MED ORDER — IPRATROPIUM-ALBUTEROL 0.5-2.5 (3) MG/3ML IN SOLN
3.0000 mL | Freq: Once | RESPIRATORY_TRACT | Status: AC
  Administered 2016-07-17: 3 mL via RESPIRATORY_TRACT
  Filled 2016-07-17: qty 3

## 2016-07-17 MED ORDER — LEVOFLOXACIN IN D5W 750 MG/150ML IV SOLN
750.0000 mg | Freq: Once | INTRAVENOUS | Status: AC
  Administered 2016-07-17: 750 mg via INTRAVENOUS
  Filled 2016-07-17: qty 150

## 2016-07-17 NOTE — ED Notes (Signed)
Bed: WA03 Expected date:  Expected time:  Means of arrival:  Comments: EMS hospice patient

## 2016-07-17 NOTE — ED Notes (Signed)
MD Clydene Pugh notified of pt's critical condition

## 2016-07-17 NOTE — ED Triage Notes (Addendum)
Hospice pt BIB EMS from home; pt began feeling badly and sleepier than usual on Saturday and contacted hospice nurse today who recommended she be evaluated with Chest XRay and bloodwork at ER; hx of COPD, emphysema, and asthma; pt states she "just feels like crap"; pt has received morphine within past 2 hours

## 2016-07-17 NOTE — ED Notes (Signed)
Informed Dr. Clydene Pugh of lactic acid of 2.16 @ 2238 by QA

## 2016-07-17 NOTE — ED Notes (Signed)
Pt is aware urine sample is needed. 

## 2016-07-17 NOTE — ED Provider Notes (Signed)
[facility]-EMERGENCY DEPT Provider Note   CSN: [id number] Arrival date & time: 07/17/16  2137     History   Chief Complaint Chief Complaint  Patient presents with  . Fatigue    HPI Wendy [name] is a 55 y.o. female.  The history is provided by the patient.  Weakness  Primary symptoms comment: loss of appetite, generalized weakness, cough. This is a new problem. The current episode started 2 days ago. The problem has been gradually worsening. There was no focality noted. The maximum temperature recorded prior to her arrival was 102 to 102.9 F. Fever duration: unknown. Associated symptoms include shortness of breath and confusion (with slowing). Pertinent negatives include no vomiting. Associated medical issues comments: COPD, Pt on hospice receiving morphine for shortness of breath and chronic pain.    Past Medical History:  Diagnosis Date  . Alcoholism (HCC)   . Asthma   . COPD (chronic obstructive pulmonary disease) (HCC)   . Depression   . Oxygen dependent    home oxygen 3L/min  . Poor dentition   . Psoriasis   . Seasonal allergies   . Urinary, incontinence, stress female     Patient Active Problem List   Diagnosis Date Noted  . Epidural hematoma (HCC)   . Pain   . Burst fracture of T12 vertebra (HCC) 06/04/2016  . Foot swelling 03/06/2016  . Anemia 01/23/2016  . Transaminasemia 01/17/2016  . DNR (do not resuscitate) discussion   . Dyspnea   . Palliative care encounter   . Hypercapnic respiratory failure, chronic (HCC)   . OSA (obstructive sleep apnea)   . Immunosuppressed status (HCC)   . Chronic respiratory failure with hypoxia (HCC) 11/20/2015  . Insomnia 06/05/2015  . Hoarseness 05/17/2015  . Vitamin D deficiency 11/17/2014  . Decreased visual acuity 11/17/2014  . Chronic diastolic heart failure (HCC) 07/02/2014  . Lung nodules 11/08/2013  . Alcohol abuse, hx of 12/01/2012    Class: Chronic  . Overdose of benzodiazepine, hx of 11/27/2012  . Alcohol  withdrawal, hx of 11/27/2012  . Psoriasis 08/18/2011  . Chronic rhinitis 11/06/2010  . Adjustment disorder with mixed anxiety and depressed mood 08/24/2010  . TOBACCO ABUSE, hx of 03/09/2008  . COPD mixed type (HCC) 03/09/2008    Past Surgical History:  Procedure Laterality Date  . CESAREAN SECTION     x 2  . TUBAL LIGATION      OB History    No data available       Home Medications    Prior to Admission medications   Medication Sig Start Date End Date Taking? Authorizing Provider  predniSONE (DELTASONE) 10 MG tablet TAKE 2 TABLETS (20 MG TOTAL) BY MOUTH DAILY WITH BREAKFAST. 06/11/16  Yes Josalyn Funches, MD  SPIRIVA HANDIHALER 18 MCG inhalation capsule PLACE 1 CAPSULE (18 MCG TOTAL) INTO INHALER AND INHALE DAILY. 03/22/16  Yes Waymon Budge, MD  traZODone (DESYREL) 50 MG tablet TAKE 1 TABLET (50 MG TOTAL) BY MOUTH AT BEDTIME. 03/06/16  Yes Historical Provider, MD  ALPRAZolam Prudy Feeler) 1 MG tablet 1 tab, three times daily and PRN at bedtime 05/24/16   Waymon Budge, MD  Calcium Citrate 200 MG TABS Take 2 tablets (400 mg total) by mouth daily. 06/19/16   Josalyn Funches, MD  chlorpheniramine-HYDROcodone (TUSSIONEX) 10-8 MG/5ML SUER Take 5 mLs by mouth every 12 (twelve) hours. 05/01/16   Waymon Budge, MD  fluticasone (FLONASE) 50 MCG/ACT nasal spray PLACE 2 SPRAYS INTO BOTH NOSTRILS DAILY. 10/28/15  Waymon Budge, MD  Fluticasone-Salmeterol (ADVAIR DISKUS) 500-50 MCG/DOSE AEPB INHALE 1 PUFF INTO THE LUNGS 2 TIMES DAILY 07/03/16   Waymon Budge, MD  ibuprofen (ADVIL,MOTRIN) 200 MG tablet Take 200 mg by mouth every 6 (six) hours as needed for fever, headache, mild pain, moderate pain or cramping.    Historical Provider, MD  levalbuterol [facility] HFA) 45 MCG/ACT inhaler Inhale 2 puffs into the lungs every 4 (four) hours as needed for wheezing or shortness of breath. 03/02/16   Dessa Phi, MD  levalbuterol Pauline Aus) 0.63 MG/3ML nebulizer solution Take 75ml every 4 hours and as  needed 05/08/16   Waymon Budge, MD  Morphine Sulfate (MORPHINE CONCENTRATE) 10 mg / 0.5 ml concentrated solution Take 0.13 mLs (2.6 mg total) by mouth every 6 (six) hours as needed for moderate pain. 2.5mg  po q6h prn for sob 06/06/16   Costin Otelia Sergeant, MD  pantoprazole (PROTONIX) 20 MG tablet TAKE 1 TABLET (20 MG TOTAL) BY MOUTH DAILY BEFORE SUPPER. 02/28/16   Waymon Budge, MD  predniSONE (DELTASONE) 10 MG tablet Take 2 tablets (20 mg total) by mouth daily with breakfast. Patient not taking: Reported on 07/17/2016 04/25/16   Waymon Budge, MD  Vitamin D, Ergocalciferol, (DRISDOL) 50000 units CAPS capsule Take 1 capsule (50,000 Units total) by mouth every 30 (thirty) days. Patient not taking: Reported on 07/17/2016 06/19/16   Dessa Phi, MD    Family History Family History  Problem Relation Age of Onset  . Lung cancer Father   . COPD Father     Social History Social History  Substance Use Topics  . Smoking status: Former Smoker    Packs/day: 1.00    Years: 40.00    Types: Cigarettes    Start date: 11/10/1970    Quit date: 09/17/2013  . Smokeless tobacco: Never Used  . Alcohol use No     Comment: 5 cans of beer daily  " 06/2014 drinks occasional ".  3s/2/16 No longer drink     Allergies   Augmentin [amoxicillin-pot clavulanate]; Cefdinir; Doxycycline; Other; Codeine; Fexofenadine; and Hydralazine   Review of Systems Review of Systems  Respiratory: Positive for shortness of breath.   Gastrointestinal: Negative for vomiting.  Neurological: Positive for weakness.  Psychiatric/Behavioral: Positive for confusion (with slowing).  All other systems reviewed and are negative.    Physical Exam Updated Vital Signs BP 116/79   Pulse (!) 132   Temp 102 F (38.9 C) (Oral)   Resp 17   SpO2 100%   Physical Exam  Constitutional: She is oriented to person, place, and time. She appears lethargic. She appears toxic.  Chronically ill appearing  HENT:  Head: Normocephalic.    Nose: Nose normal.  Eyes: Conjunctivae are normal.  Neck: Neck supple. No tracheal deviation present.  Cardiovascular: Regular rhythm.  Tachycardia present.   Pulmonary/Chest: Tachypnea noted. She has rhonchi (diffuse bilateral).  Shallow respirations  Abdominal: Soft. She exhibits no distension. There is no tenderness. There is no rigidity, no rebound and no guarding.  Neurological: She is oriented to person, place, and time. She appears lethargic. No cranial nerve deficit. GCS eye subscore is 3. GCS verbal subscore is 5. GCS motor subscore is 6.  Skin: Skin is warm and dry.  Psychiatric: She has a normal mood and affect.     ED Treatments / Results  Labs (all labs ordered are listed, but only abnormal results are displayed) Labs Reviewed  COMPREHENSIVE METABOLIC PANEL - Abnormal; Notable for the following:  Result Value   Sodium 130 (*)    Chloride 84 (*)    CO2 35 (*)    Creatinine, Ser 1.18 (*)    Calcium 8.6 (*)    Total Bilirubin 1.3 (*)    GFR calc non Af Amer 50 (*)    GFR calc Af Amer 58 (*)    All other components within normal limits  CBC WITH DIFFERENTIAL/PLATELET - Abnormal; Notable for the following:    WBC 32.1 (*)    Neutro Abs 28.6 (*)    Monocytes Absolute 1.9 (*)    All other components within normal limits  I-STAT CG4 LACTIC ACID, ED - Abnormal; Notable for the following:    Lactic Acid, Venous 2.16 (*)    All other components within normal limits  CULTURE, BLOOD (ROUTINE X 2)  CULTURE, BLOOD (ROUTINE X 2)  URINE CULTURE  URINALYSIS, ROUTINE W REFLEX MICROSCOPIC (NOT AT [facility])    EKG  EKG Interpretation  Date/Time:  Tuesday July 17 2016 22:34:07 EDT Ventricular Rate:  131 PR Interval:    QRS Duration: 89 QT Interval:  296 QTC Calculation: 437 R Axis:   -98 Text Interpretation:  Sinus tachycardia Ventricular premature complex Markedly posterior QRS axis Confirmed by Hamlin Devine MD, Reuel Boom ([id number]) on 07/17/2016 10:38:00 PM        Radiology Dg Chest 2 View  Result Date: 07/17/2016 CLINICAL DATA:  Acute onset of shortness of breath and fatigue. Initial encounter. EXAM: CHEST  2 VIEW COMPARISON:  Chest radiograph and CT of the chest performed 06/04/2016 FINDINGS: New right upper lobe airspace opacity is suspicious for pneumonia. Underlying interstitial prominence is noted. No pleural effusion or pneumothorax is seen. The known left-sided pulmonary nodules are not well characterized on radiograph. The heart remains normal in size. No acute osseous abnormalities are identified. IMPRESSION: New right upper lobe airspace opacity is suspicious for pneumonia. Electronically Signed   By: Roanna Raider M.D.   On: 07/17/2016 22:52    Procedures Procedures (including critical care time)  CRITICAL CARE Performed by: Lyndal Pulley Total critical care time: 30 minutes Critical care time was exclusive of separately billable procedures and treating other patients. Critical care was necessary to treat or prevent imminent or life-threatening deterioration. Critical care was time spent personally by me on the following activities: development of treatment plan with patient and/or surrogate as well as nursing, discussions with consultants, evaluation of patient's response to treatment, examination of patient, obtaining history from patient or surrogate, ordering and performing treatments and interventions, ordering and review of laboratory studies, ordering and review of radiographic studies, pulse oximetry and re-evaluation of patient's condition.   Medications Ordered in ED Medications  levofloxacin (LEVAQUIN) IVPB 750 mg (750 mg Intravenous New Bag/Given 07/17/16 2309)  sodium chloride 0.9 % bolus 1,000 mL (1,000 mLs Intravenous New Bag/Given 07/17/16 2215)    And  sodium chloride 0.9 % bolus 1,000 mL (1,000 mLs Intravenous New Bag/Given 07/17/16 2226)  ipratropium-albuterol (DUONEB) 0.5-2.5 (3) MG/3ML nebulizer solution 3 mL (3  mLs Nebulization Given 07/17/16 2309)     Initial Impression / Assessment and Plan / ED Course  I have reviewed the triage vital signs and the nursing notes.  Pertinent labs & imaging results that were available during my care of the patient were reviewed by me and considered in my medical decision making (see chart for details).  Clinical Course    55 y.o. female presents with lethargy, slight confusion, loss of appetite and poor oral intake for 2  days. She is with hospoce for end stage COPD and receiving ongoing morphine treatment for chronic pain and breathing difficulty. Developed fever and arrives appearing septic. Presumed source of CAP and covered with levaquin pending culture data. Fluid resuscitation with multiple boluses initiated, Pt wishes to be full scope of medical care and daughter requests admission over comfort care at home. Wishes to remain DNR. Hospitalist was consulted for admission and will see the patient in the emergency department.   Final Clinical Impressions(s) / ED Diagnoses   Final diagnoses:  Sepsis, due to unspecified organism [facility])  Community acquired pneumonia, unspecified laterality    New Prescriptions New Prescriptions   No medications on file     Lyndal Pulley, MD 07/18/16 [phone]

## 2016-07-17 NOTE — ED Notes (Signed)
MD at bedside. 

## 2016-07-18 ENCOUNTER — Telehealth: Payer: Self-pay | Admitting: Internal Medicine

## 2016-07-18 DIAGNOSIS — J189 Pneumonia, unspecified organism: Secondary | ICD-10-CM | POA: Diagnosis not present

## 2016-07-18 DIAGNOSIS — R0602 Shortness of breath: Secondary | ICD-10-CM | POA: Diagnosis present

## 2016-07-18 DIAGNOSIS — K219 Gastro-esophageal reflux disease without esophagitis: Secondary | ICD-10-CM | POA: Diagnosis present

## 2016-07-18 DIAGNOSIS — J9621 Acute and chronic respiratory failure with hypoxia: Secondary | ICD-10-CM | POA: Diagnosis not present

## 2016-07-18 DIAGNOSIS — I5032 Chronic diastolic (congestive) heart failure: Secondary | ICD-10-CM | POA: Diagnosis not present

## 2016-07-18 DIAGNOSIS — J9622 Acute and chronic respiratory failure with hypercapnia: Secondary | ICD-10-CM | POA: Diagnosis present

## 2016-07-18 DIAGNOSIS — N179 Acute kidney failure, unspecified: Secondary | ICD-10-CM | POA: Diagnosis not present

## 2016-07-18 DIAGNOSIS — Z515 Encounter for palliative care: Secondary | ICD-10-CM | POA: Diagnosis present

## 2016-07-18 DIAGNOSIS — I471 Supraventricular tachycardia: Secondary | ICD-10-CM | POA: Diagnosis present

## 2016-07-18 DIAGNOSIS — J440 Chronic obstructive pulmonary disease with acute lower respiratory infection: Secondary | ICD-10-CM | POA: Diagnosis present

## 2016-07-18 DIAGNOSIS — R443 Hallucinations, unspecified: Secondary | ICD-10-CM | POA: Diagnosis not present

## 2016-07-18 DIAGNOSIS — Z66 Do not resuscitate: Secondary | ICD-10-CM | POA: Diagnosis present

## 2016-07-18 DIAGNOSIS — A419 Sepsis, unspecified organism: Secondary | ICD-10-CM | POA: Diagnosis present

## 2016-07-18 DIAGNOSIS — R410 Disorientation, unspecified: Secondary | ICD-10-CM | POA: Diagnosis not present

## 2016-07-18 DIAGNOSIS — J9611 Chronic respiratory failure with hypoxia: Secondary | ICD-10-CM | POA: Diagnosis not present

## 2016-07-18 DIAGNOSIS — Y95 Nosocomial condition: Secondary | ICD-10-CM | POA: Diagnosis present

## 2016-07-18 DIAGNOSIS — G9340 Encephalopathy, unspecified: Secondary | ICD-10-CM | POA: Diagnosis present

## 2016-07-18 DIAGNOSIS — E871 Hypo-osmolality and hyponatremia: Secondary | ICD-10-CM | POA: Diagnosis present

## 2016-07-18 DIAGNOSIS — J441 Chronic obstructive pulmonary disease with (acute) exacerbation: Secondary | ICD-10-CM | POA: Diagnosis not present

## 2016-07-18 DIAGNOSIS — Z9981 Dependence on supplemental oxygen: Secondary | ICD-10-CM | POA: Diagnosis not present

## 2016-07-18 DIAGNOSIS — B379 Candidiasis, unspecified: Secondary | ICD-10-CM | POA: Diagnosis present

## 2016-07-18 DIAGNOSIS — Z87891 Personal history of nicotine dependence: Secondary | ICD-10-CM | POA: Diagnosis not present

## 2016-07-18 LAB — URINALYSIS, ROUTINE W REFLEX MICROSCOPIC
Glucose, UA: NEGATIVE mg/dL
Hgb urine dipstick: NEGATIVE
Ketones, ur: NEGATIVE mg/dL
Nitrite: NEGATIVE
PROTEIN: 30 mg/dL — AB
SPECIFIC GRAVITY, URINE: 1.017 (ref 1.005–1.030)
pH: 6 (ref 5.0–8.0)

## 2016-07-18 LAB — RESPIRATORY PANEL BY PCR

## 2016-07-18 LAB — LACTIC ACID, PLASMA
Lactic Acid, Venous: 1.7 mmol/L (ref 0.5–1.9)
Lactic Acid, Venous: 0.9 mmol/L (ref 0.5–1.9)

## 2016-07-18 LAB — PROTIME-INR
INR: 1.51
Prothrombin Time: 18.3 seconds — ABNORMAL HIGH (ref 11.4–15.2)

## 2016-07-18 LAB — BASIC METABOLIC PANEL
Anion gap: 9 (ref 5–15)
Anion gap: 8 (ref 5–15)
BUN: 16 mg/dL (ref 6–20)
BUN: 15 mg/dL (ref 6–20)
CHLORIDE: 92 mmol/L — AB (ref 101–111)
CHLORIDE: 96 mmol/L — AB (ref 101–111)
CO2: 30 mmol/L (ref 22–32)
CO2: 28 mmol/L (ref 22–32)
CREATININE: 0.91 mg/dL (ref 0.44–1.00)
CREATININE: 1.04 mg/dL — AB (ref 0.44–1.00)
Calcium: 7.4 mg/dL — ABNORMAL LOW (ref 8.9–10.3)
Calcium: 7.2 mg/dL — ABNORMAL LOW (ref 8.9–10.3)
GFR calc non Af Amer: >60 mL/min (ref >60)
GFR calc non Af Amer: 58 mL/min — ABNORMAL LOW (ref >60)
Glucose, Bld: 120 mg/dL — ABNORMAL HIGH (ref 65–99)
Glucose, Bld: 111 mg/dL — ABNORMAL HIGH (ref 65–99)
POTASSIUM: 3.9 mmol/L (ref 3.5–5.1)
POTASSIUM: 4.5 mmol/L (ref 3.5–5.1)
SODIUM: 131 mmol/L — AB (ref 135–145)
SODIUM: 132 mmol/L — AB (ref 135–145)

## 2016-07-18 LAB — OSMOLALITY: Osmolality: 277 mOsm/kg (ref 275–295)

## 2016-07-18 LAB — INFLUENZA PANEL BY PCR (TYPE A & B)
Influenza A By PCR: NEGATIVE
Influenza B By PCR: NEGATIVE

## 2016-07-18 LAB — OSMOLALITY, URINE: Osmolality, Ur: 456 mOsm/kg (ref 300–900)

## 2016-07-18 LAB — TSH: TSH: 0.528 u[IU]/mL (ref 0.350–4.500)

## 2016-07-18 LAB — STREP PNEUMONIAE URINARY ANTIGEN: Strep Pneumo Urinary Antigen: NEGATIVE

## 2016-07-18 LAB — URINE MICROSCOPIC-ADD ON

## 2016-07-18 LAB — CREATININE, URINE, RANDOM: Creatinine, Urine: 199.63 mg/dL

## 2016-07-18 LAB — PROCALCITONIN: Procalcitonin: 1.96 ng/mL

## 2016-07-18 LAB — BRAIN NATRIURETIC PEPTIDE: B NATRIURETIC PEPTIDE 5: 80.6 pg/mL (ref 0.0–100.0)

## 2016-07-18 LAB — SODIUM, URINE, RANDOM: Sodium, Ur: 48 mmol/L

## 2016-07-18 LAB — APTT: APTT: 30 s (ref 24–36)

## 2016-07-18 LAB — MRSA PCR SCREENING: MRSA by PCR: POSITIVE — AB

## 2016-07-18 MED ORDER — SODIUM CHLORIDE 0.9 % IV SOLN
INTRAVENOUS | Status: DC
  Administered 2016-07-18 (×2): via INTRAVENOUS
  Administered 2016-07-19: 1000 mL via INTRAVENOUS
  Administered 2016-07-19 – 2016-07-20 (×3): via INTRAVENOUS

## 2016-07-18 MED ORDER — MORPHINE SULFATE (CONCENTRATE) 10 MG/0.5ML PO SOLN
2.5000 mg | Freq: Four times a day (QID) | ORAL | Status: DC | PRN
  Administered 2016-07-18 – 2016-07-23 (×9): 2.6 mg via ORAL
  Filled 2016-07-18 (×9): qty 0.5

## 2016-07-18 MED ORDER — HYDROCOD POLST-CPM POLST ER 10-8 MG/5ML PO SUER
5.0000 mL | Freq: Two times a day (BID) | ORAL | Status: DC
  Administered 2016-07-18 – 2016-07-19 (×4): 5 mL via ORAL
  Filled 2016-07-18 (×4): qty 5

## 2016-07-18 MED ORDER — ACETAMINOPHEN 325 MG PO TABS
650.0000 mg | ORAL_TABLET | Freq: Four times a day (QID) | ORAL | Status: DC | PRN
  Administered 2016-07-18: 650 mg via ORAL
  Filled 2016-07-18 (×2): qty 2

## 2016-07-18 MED ORDER — TRAZODONE HCL 50 MG PO TABS
50.0000 mg | ORAL_TABLET | Freq: Every day | ORAL | Status: DC
  Administered 2016-07-18 – 2016-07-22 (×4): 50 mg via ORAL
  Filled 2016-07-18 (×5): qty 1

## 2016-07-18 MED ORDER — LEVALBUTEROL HCL 0.63 MG/3ML IN NEBU: 0.6300 mg | INHALATION_SOLUTION | Freq: Four times a day (QID) | RESPIRATORY_TRACT | Status: DC | PRN

## 2016-07-18 MED ORDER — ALPRAZOLAM 1 MG PO TABS
1.0000 mg | ORAL_TABLET | Freq: Every evening | ORAL | Status: DC | PRN
  Administered 2016-07-18 – 2016-07-19 (×2): 1 mg via ORAL
  Filled 2016-07-18 (×2): qty 1

## 2016-07-18 MED ORDER — ENOXAPARIN SODIUM 40 MG/0.4ML ~~LOC~~ SOLN
40.0000 mg | SUBCUTANEOUS | Status: DC
Start: 2016-07-18 — End: 2016-07-23
  Administered 2016-07-18 – 2016-07-23 (×6): 40 mg via SUBCUTANEOUS
  Filled 2016-07-18 (×6): qty 0.4

## 2016-07-18 MED ORDER — METHYLPREDNISOLONE SODIUM SUCC 125 MG IJ SOLR
60.0000 mg | Freq: Every day | INTRAMUSCULAR | Status: DC
  Administered 2016-07-19: 60 mg via INTRAVENOUS
  Filled 2016-07-18: qty 2

## 2016-07-18 MED ORDER — PANTOPRAZOLE SODIUM 40 MG PO TBEC
40.0000 mg | DELAYED_RELEASE_TABLET | Freq: Every day | ORAL | Status: DC
  Administered 2016-07-18 – 2016-07-22 (×5): 40 mg via ORAL
  Filled 2016-07-18 (×6): qty 1

## 2016-07-18 MED ORDER — ADENOSINE 6 MG/2ML IV SOLN
6.0000 mg | Freq: Once | INTRAVENOUS | Status: AC
  Administered 2016-07-18: 6 mg via INTRAVENOUS

## 2016-07-18 MED ORDER — ADENOSINE 6 MG/2ML IV SOLN
INTRAVENOUS | Status: AC
  Filled 2016-07-18: qty 2

## 2016-07-18 MED ORDER — SODIUM CHLORIDE 0.9 % IV BOLUS (SEPSIS)
1000.0000 mL | Freq: Once | INTRAVENOUS | Status: AC
  Administered 2016-07-18: 1000 mL via INTRAVENOUS

## 2016-07-18 MED ORDER — CALCIUM CITRATE 950 (200 CA) MG PO TABS
950.0000 mg | ORAL_TABLET | Freq: Every day | ORAL | Status: DC
Start: 2016-07-18 — End: 2016-07-23
  Administered 2016-07-18 – 2016-07-21 (×4): 950 mg via ORAL
  Filled 2016-07-18 (×6): qty 1

## 2016-07-18 MED ORDER — IPRATROPIUM BROMIDE 0.02 % IN SOLN
0.5000 mg | Freq: Three times a day (TID) | RESPIRATORY_TRACT | Status: DC
  Administered 2016-07-18 – 2016-07-23 (×15): 0.5 mg via RESPIRATORY_TRACT
  Filled 2016-07-18 (×17): qty 2.5

## 2016-07-18 MED ORDER — MORPHINE SULFATE (PF) 2 MG/ML IV SOLN
4.0000 mg | Freq: Once | INTRAVENOUS | Status: AC
  Administered 2016-07-18: 4 mg via INTRAVENOUS
  Filled 2016-07-18: qty 2

## 2016-07-18 MED ORDER — LEVOFLOXACIN IN D5W 750 MG/150ML IV SOLN
750.0000 mg | INTRAVENOUS | Status: DC
  Administered 2016-07-18: 750 mg via INTRAVENOUS
  Filled 2016-07-18: qty 150

## 2016-07-18 MED ORDER — FLUTICASONE PROPIONATE 50 MCG/ACT NA SUSP
1.0000 | Freq: Every day | NASAL | Status: DC
Start: 2016-07-18 — End: 2016-07-23
  Administered 2016-07-18 – 2016-07-23 (×6): 1 via NASAL
  Filled 2016-07-18: qty 16

## 2016-07-18 MED ORDER — VANCOMYCIN HCL IN DEXTROSE 1-5 GM/200ML-% IV SOLN
1000.0000 mg | INTRAVENOUS | Status: DC
  Administered 2016-07-18: 1000 mg via INTRAVENOUS
  Filled 2016-07-18: qty 200

## 2016-07-18 MED ORDER — LEVALBUTEROL HCL 0.63 MG/3ML IN NEBU
0.6300 mg | INHALATION_SOLUTION | Freq: Three times a day (TID) | RESPIRATORY_TRACT | Status: DC
  Administered 2016-07-18 – 2016-07-23 (×15): 0.63 mg via RESPIRATORY_TRACT
  Filled 2016-07-18 (×17): qty 3

## 2016-07-18 MED ORDER — VANCOMYCIN HCL 500 MG IV SOLR
500.0000 mg | Freq: Two times a day (BID) | INTRAVENOUS | Status: DC
  Administered 2016-07-18 – 2016-07-23 (×11): 500 mg via INTRAVENOUS
  Filled 2016-07-18 (×12): qty 500

## 2016-07-18 MED ORDER — METHYLPREDNISOLONE SODIUM SUCC 125 MG IJ SOLR
60.0000 mg | Freq: Two times a day (BID) | INTRAMUSCULAR | Status: DC
  Administered 2016-07-18 (×2): 60 mg via INTRAVENOUS
  Filled 2016-07-18 (×2): qty 2

## 2016-07-18 MED ORDER — ACETAMINOPHEN 650 MG RE SUPP
650.0000 mg | Freq: Four times a day (QID) | RECTAL | Status: DC | PRN
  Filled 2016-07-18: qty 1

## 2016-07-18 MED ORDER — DEXTROSE 5 % IV SOLN
2.0000 g | Freq: Three times a day (TID) | INTRAVENOUS | Status: DC
Start: 2016-07-18 — End: 2016-07-21
  Administered 2016-07-18 – 2016-07-21 (×11): 2 g via INTRAVENOUS
  Filled 2016-07-18 (×12): qty 2

## 2016-07-18 MED ORDER — SODIUM CHLORIDE 0.9 % IV BOLUS (SEPSIS)
500.0000 mL | Freq: Once | INTRAVENOUS | Status: AC
  Administered 2016-07-18: 500 mL via INTRAVENOUS

## 2016-07-18 MED ORDER — IPRATROPIUM BROMIDE 0.02 % IN SOLN: 0.5000 mg | RESPIRATORY_TRACT | Status: DC

## 2016-07-18 NOTE — Telephone Encounter (Signed)
Wendy [name] is calling from Hospice to make CY aware that the pt has been admitted to [facility] in room 1236. FYI sent to [facility]

## 2016-07-18 NOTE — Evaluation (Signed)
Physical Therapy Evaluation Patient Details Name: JACILYN SANPEDRO MRN: [id number] DOB: 1961-05-10 Today's Date: 07/18/2016   History of Present Illness  AMANDO ISHIKAWA is a 55 y.o. female with medical history significant of COPD, hospice care, GERD, depression, anxiety, former alcoholism, psoriasis, dCHF, former smoker, who presents with fever, chills, shortness stress, generalized weakness.  Clinical Impression  Pt admitted as above and presenting with functional mobility limitations 2* generalized weakness and poor endurance.  Pt should progress to dc home with family assist and would benefit from follow up HHPT dependent on acute stay progress.    Follow Up Recommendations Home health PT    Equipment Recommendations  None recommended by PT    Recommendations for Other Services OT consult     Precautions / Restrictions Precautions Precautions: Fall Restrictions Weight Bearing Restrictions: No      Mobility  Bed Mobility Overal bed mobility: Needs Assistance Bed Mobility: Supine to Sit     Supine to sit: Mod assist;+2 for safety/equipment        Transfers Overall transfer level: Needs assistance Equipment used: Rolling walker (2 wheeled) Transfers: Sit to/from Stand Sit to Stand: Mod assist;+2 safety/equipment Stand pivot transfers: Mod assist;+2 safety/equipment          Ambulation/Gait Ambulation/Gait assistance: Min assist;+2 safety/equipment Ambulation Distance (Feet): 4 Feet Assistive device: Rolling walker (2 wheeled) Gait Pattern/deviations: Step-to pattern;Decreased step length - right;Decreased step length - left;Shuffle;Trunk flexed Gait velocity: decr Gait velocity interpretation: Below normal speed for age/gender General Gait Details: cues for posture and position from RW  Stairs            Wheelchair Mobility    Modified Rankin (Stroke Patients Only)       Balance                                              Pertinent Vitals/Pain Pain Assessment: No/denies pain    Home Living Family/patient expects to be discharged to:: Private residence Living Arrangements: Children Available Help at Discharge: Family;Available 24 hours/day Type of Home: House Home Access: Stairs to enter   [employer] of Steps: 1 Home Layout: One level Home Equipment: Hospital bed;Walker - 4 wheels;Bedside commode;Shower seat Additional Comments: pt often sponge bathes due to 02 demand    Prior Function Level of Independence: Needs assistance   Gait / Transfers Assistance Needed: Walks with rollator RW  ADL's / Homemaking Assistance Needed: Reports independent with ADLs and IADLs   Comments: Her companion and son able to assist with IADLs.     Hand Dominance   Dominant Hand: Right    Extremity/Trunk Assessment   Upper Extremity Assessment: Generalized weakness           Lower Extremity Assessment: Generalized weakness         Communication   Communication: No difficulties  Cognition Arousal/Alertness: Awake/alert Behavior During Therapy: WFL for tasks assessed/performed Overall Cognitive Status: Within Functional Limits for tasks assessed                      General Comments      Exercises     Assessment/Plan    PT Assessment Patient needs continued PT services  PT Problem List Decreased strength;Decreased range of motion;Decreased activity tolerance;Decreased mobility;Decreased knowledge of use of DME          PT Treatment Interventions  DME instruction;Gait training;Stair training;Functional mobility training;Therapeutic activities;Therapeutic exercise;Patient/family education    PT Goals (Current goals can be found in the Care Plan section)  Acute Rehab PT Goals Patient Stated Goal: home when feel better PT Goal Formulation: With patient Time For Goal Achievement: 07/28/16 Potential to Achieve Goals: Good    Frequency Min 3X/week   Barriers to  discharge        Co-evaluation PT/OT/SLP Co-Evaluation/Treatment: Yes Reason for Co-Treatment: For patient/therapist safety PT goals addressed during session: Mobility/safety with mobility OT goals addressed during session: ADL's and self-care       End of Session Equipment Utilized During Treatment: Oxygen Activity Tolerance: Patient limited by fatigue Patient left: in chair;with call bell/phone within reach;with chair alarm set Nurse Communication: Mobility status         Time: 8850-2774 PT Time Calculation (min) (ACUTE ONLY): 34 min   Charges:   PT Evaluation $PT Eval Moderate Complexity: 1 Procedure     PT G Codes:        Bernardo Brayman 2016-08-14, 3:12 PM

## 2016-07-18 NOTE — Evaluation (Signed)
SLP Cancellation Note  Patient Details Name: Wendy [name] MRN: [id number] DOB: 09-Jun-1961   Cancelled treatment:       Reason Eval/Treat Not Completed: Medical issues which prohibited therapy (pt having breathing treatment currently, will continue efforts)   Mills Koller, MS [facility] SLP [phone]

## 2016-07-18 NOTE — Progress Notes (Signed)
Pharmacy Antibiotic Note  Wendy [name] is a 55 y.o. female admitted on 07/17/2016 with pneumonia.  Pharmacy has been consulted for vancomycin dosing.  Plan: Vancomycin 1Gm IV q24h (VT=15-20 mg/L) Azactam 2Gm IV q8h Levaquin 750mg  IV q24h     Temp (24hrs), Avg:101.7 F (38.7 C), Min:101.4 F (38.6 C), Max:102 F (38.9 C)   Recent Labs Lab 07/17/16 2219 07/17/16 2231  WBC 32.1*  --   CREATININE 1.18*  --   LATICACIDVEN  --  2.16*    CrCl cannot be calculated (Unknown ideal weight.).    Allergies  Allergen Reactions  . Augmentin [Amoxicillin-Pot Clavulanate] Nausea Only    Has patient had a PCN reaction causing immediate rash, facial/tongue/throat swelling, SOB or lightheadedness with hypotension: No Has patient had a PCN reaction causing severe rash involving mucus membranes or skin necrosis: No Has patient had a PCN reaction that required hospitalization No Has patient had a PCN reaction occurring within the last 10 years: Yes If all of the above answers are "NO", then may proceed with Cephalosporin use.   . Cefdinir Other (See Comments)    Aches, "heart fluttering"  . Doxycycline Nausea And Vomiting  . Other Nausea Only    PAIN MEDICATIONS-nausea  . Codeine Nausea Only  . Fexofenadine Nausea Only  . Hydralazine     Side effect Felt woozy    Antimicrobials this admission: 10/31 levaquin >>  11/1 vancomycin >>  11/1 azactam >>  Dose adjustments this admission:   Microbiology results:  BCx:   UCx:    Sputum:    MRSA PCR:   Thank you for allowing pharmacy to be a part of this patient's care.  13/1 R 07/18/2016 1:32 AM

## 2016-07-18 NOTE — Progress Notes (Addendum)
PROGRESS NOTE    Wendy [name]  RCV:[id number] DOB: [birth year] DOA: 07/17/2016  PCP: Lora Paula, MD   Brief Narrative:  Wendy [name] is a 55 y.o. female with medical history significant of COPD under hospice care, GERD, depression, anxiety, former alcoholism, psoriasis, dCHF, former smoker, who presents with fever, chills, shortness of breath, generalized weakness and right upper chest pain for past few days.   She had a recent admission from 9/18- 9/20 for traumatic T 12 burst fracture.   Subjective: States her chest hurts- point to right upper chest wall. Mild nausea.   Assessment & Plan:   Principal Problem:   Acute on chronic respiratory failure with hypoxia/ severe sepsis (A)  HCAP (healthcare-associated pneumonia) - RUL with symptoms of dyspnea and fever (102)- tachycardic- Lactic acid 2.16- has improved- procalcitonin 1.96 - currently on Vanc, Azactam and Levaquin due to PCN allergy but in the past she has been able to tolerate Zosyn - MRSA PCR + so continue Vanc - current regimen will provide coverage for atypicals and therefore will continue this - SLP eval to assess whether she has issues with silent aspiration - influenza and resp viral panel negative  - f/u blood cultures  (B) COPD exacerbation - on 3 L of O 2 at baseline which has not changed - currently no wheezing - taper steroids- cont Nebs - on hospice for severe COPD and on 20 mg of Prednisone daily at baseline - oral Morphine PRN  Active Problems:   Hyponatremia/ mild AKI - due to dehydration- cont  NS at 100 cc/her  Traumatic T12 fracture on 9/18 - no back pain  Thrush - Nystatin- follow  Elevated PT/ INR - recheck tomorrow    Chronic diastolic heart failure - grade 1 diastolic dysfunction on 3/17    GERD (gastroesophageal reflux disease) - Protonix  Lung nodules  - 2 nodules noted on CT on 9/17   DVT prophylaxis: Lovenox Code Status: DNR Family Communication:    Disposition Plan: follow in SDU Consultants:    Procedures:    Antimicrobials:  Anti-infectives    Start     Dose/Rate Route Frequency Ordered Stop   07/18/16 2200  levofloxacin (LEVAQUIN) IVPB 750 mg     750 mg 100 mL/hr over 90 Minutes Intravenous Every 24 hours 07/18/16 0049     07/18/16 0200  aztreonam (AZACTAM) 2 g in dextrose 5 % 50 mL IVPB     2 g 100 mL/hr over 30 Minutes Intravenous Every 8 hours 07/18/16 0053 07/26/16 0159   07/18/16 0200  vancomycin (VANCOCIN) IVPB 1000 mg/200 mL premix     1,000 mg 200 mL/hr over 60 Minutes Intravenous Every 24 hours 07/18/16 0058     07/17/16 2300  levofloxacin (LEVAQUIN) IVPB 750 mg     750 mg 100 mL/hr over 90 Minutes Intravenous  Once 07/17/16 2255 07/18/16 0100       Objective: Vitals:   07/18/16 0500 07/18/16 0600 07/18/16 0800 07/18/16 0815  BP:  103/64  (!) 83/48  Pulse: (!) 120 (!) 112  100  Resp: 16 19  14   Temp: 99.7 F (37.6 C)  98.6 F (37 C)   TempSrc: Oral  Oral   SpO2: 95% 97%  97%  Weight:      Height:        Intake/Output Summary (Last 24 hours) at 07/18/16 0839 Last data filed at 07/18/16 0600  Gross per 24 hour  Intake  1255 ml  Output                0 ml  Net             1255 ml   Filed Weights   07/18/16 0200 07/18/16 0322  Weight: 62.5 kg (137 lb 12.6 oz) 65.8 kg (145 lb 1 oz)    Examination: General exam: Appears comfortable  HEENT: PERRLA, oral mucosa moist, no sclera icterus + thrush Respiratory system: Clear to auscultation. Respiratory effort normal. Pulse ox 100% on 3 L Cardiovascular system: S1 & S2 heard, RRR.  No murmurs  Gastrointestinal system: Abdomen soft, non-tender, nondistended. Normal bowel sound. No organomegaly Central nervous system: Alert and oriented. No focal neurological deficits. Extremities: No cyanosis, clubbing or edema Skin: No rashes or ulcers Psychiatry:  Mood & affect appropriate.     Data Reviewed: I have personally reviewed following  labs and imaging studies  CBC:  Recent Labs Lab 07/17/16 2219  WBC 32.1*  NEUTROABS 28.6*  HGB 12.1  HCT 37.8  MCV 87.9  PLT 288   Basic Metabolic Panel:  Recent Labs Lab 07/17/16 2219 07/18/16 0249  NA 130* 131*  K 4.0 3.9  CL 84* 92*  CO2 35* 30  GLUCOSE 91 120*  BUN 17 16  CREATININE 1.18* 0.91  CALCIUM 8.6* 7.4*   GFR: Estimated Creatinine Clearance: 59.2 mL/min (by C-G formula based on SCr of 0.91 mg/dL). Liver Function Tests:  Recent Labs Lab 07/17/16 2219  AST 20  ALT 14  ALKPHOS 54  BILITOT 1.3*  PROT 7.4  ALBUMIN 3.8   No results for input(s): LIPASE, AMYLASE in the last 168 hours. No results for input(s): AMMONIA in the last 168 hours. Coagulation Profile:  Recent Labs Lab 07/18/16 0249  INR 1.51   Cardiac Enzymes: No results for input(s): CKTOTAL, CKMB, CKMBINDEX, TROPONINI in the last 168 hours. BNP (last 3 results) No results for input(s): PROBNP in the last 8760 hours. HbA1C: No results for input(s): HGBA1C in the last 72 hours. CBG: No results for input(s): GLUCAP in the last 168 hours. Lipid Profile: No results for input(s): CHOL, HDL, LDLCALC, TRIG, CHOLHDL, LDLDIRECT in the last 72 hours. Thyroid Function Tests:  Recent Labs  07/18/16 0604  TSH 0.528   Anemia Panel: No results for input(s): VITAMINB12, FOLATE, FERRITIN, TIBC, IRON, RETICCTPCT in the last 72 hours. Urine analysis:    Component Value Date/Time   COLORURINE YELLOW 01/17/2016 1508   APPEARANCEUR CLEAR 01/17/2016 1508   LABSPEC 1.003 (L) 01/17/2016 1508   PHURINE 7.0 01/17/2016 1508   GLUCOSEU NEGATIVE 01/17/2016 1508   HGBUR NEGATIVE 01/17/2016 1508   BILIRUBINUR NEGATIVE 01/17/2016 1508   KETONESUR NEGATIVE 01/17/2016 1508   PROTEINUR NEGATIVE 01/17/2016 1508   UROBILINOGEN 0.2 09/19/2010 1112   NITRITE NEGATIVE 01/17/2016 1508   LEUKOCYTESUR NEGATIVE 01/17/2016 1508   Sepsis Labs: @LABRCNTIP (procalcitonin:4,lacticidven:4) ) Recent Results (from  the past 240 hour(s))  MRSA PCR Screening     Status: Abnormal   Collection Time: 07/18/16  2:11 AM  Result Value Ref Range Status   MRSA by PCR POSITIVE (A) NEGATIVE Final    Comment:        The GeneXpert MRSA Assay (FDA approved for NASAL specimens only), is one component of a comprehensive MRSA colonization surveillance program. It is not intended to diagnose MRSA infection nor to guide or monitor treatment for MRSA infections. RESULT CALLED TO, READ BACK BY AND VERIFIED WITH: J MCNULTY RN [id number] 07/18/16 A  [facility]          Radiology Studies: Dg Chest 2 View  Result Date: 07/17/2016 CLINICAL DATA:  Acute onset of shortness of breath and fatigue. Initial encounter. EXAM: CHEST  2 VIEW COMPARISON:  Chest radiograph and CT of the chest performed 06/04/2016 FINDINGS: New right upper lobe airspace opacity is suspicious for pneumonia. Underlying interstitial prominence is noted. No pleural effusion or pneumothorax is seen. The known left-sided pulmonary nodules are not well characterized on radiograph. The heart remains normal in size. No acute osseous abnormalities are identified. IMPRESSION: New right upper lobe airspace opacity is suspicious for pneumonia. Electronically Signed   By: Roanna Raider M.D.   On: 07/17/2016 22:52      Scheduled Meds: . aztreonam  2 g Intravenous Q8H  . calcium citrate  950 mg Oral Daily  . chlorpheniramine-HYDROcodone  5 mL Oral Q12H  . enoxaparin (LOVENOX) injection  40 mg Subcutaneous Q24H  . fluticasone  1 spray Each Nare Daily  . ipratropium  0.5 mg Nebulization TID  . levalbuterol  0.63 mg Nebulization TID  . levofloxacin (LEVAQUIN) IV  750 mg Intravenous Q24H  . methylPREDNISolone (SOLU-MEDROL) injection  60 mg Intravenous Q12H  . pantoprazole  40 mg Oral Daily  . traZODone  50 mg Oral QHS  . vancomycin  1,000 mg Intravenous Q24H   Continuous Infusions: . sodium chloride 100 mL/hr at 07/18/16 0239     LOS: 0 days    Time spent in  minutes: 35    Laityn Bensen, MD [facility] Pager: [email or web address] Password [facility] 07/18/2016, 8:39 AM

## 2016-07-18 NOTE — Progress Notes (Signed)
Initial Nutrition Assessment  DOCUMENTATION CODES:   Not applicable  INTERVENTION:  - Continue to encourage PO intakes. - RD will monitor for nutrition-related needs at follow-up.  NUTRITION DIAGNOSIS:   Inadequate oral intake related to acute illness, poor appetite as evidenced by per patient/family report.  GOAL:   Patient will meet greater than or equal to 90% of their needs  MONITOR:   PO intake, Weight trends, Labs, I & O's  REASON FOR ASSESSMENT:   Consult Assessment of nutrition requirement/status  ASSESSMENT:   55 y.o. female with medical history significant of COPD, hospice care, GERD, depression, anxiety, former alcoholism, psoriasis, dCHF, former smoker, who presents with fever, chills, shortness stress, generalized weakness. Pt is under hospice care due to severe COPD. Patient states that she has been feeling bad in the past several days. She has generalized weakness, fever and chills. She has worsening shortness of breath, but denies worsening cough. She has mild CP which is not aggravated or alleviated by any known factors. Patient denies nausea, vomiting, diarrhea, abdominal pain, symptoms of a UTI or unilateral weakness.   Pt seen for consult. BMI indicates overweight status. Pt states that she recently ordered fruit and sweet tea (both of which she greatly enjoys). No intakes documented since admission. She states that she stopped drinking and smoking 5 years ago and since that time has had a very good appetite. Her weight prior to becoming sober was 108 lbs and her max weight after becoming sober was 158 lbs. Pt reports that recent UBW is ~140 lbs. She states that she has a very good appetite and eats "everything" at home but that appetite began to decrease over the weekend as she began to feel sick. Pt states that she ordered something "light" this AM to help her get her usual appetite back.   Pt denies any chewing or swallowing difficulties. She states that she  usually does not become increasingly SOB with PO intakes but that with being sick she becomes SOB quickly/easily and that SOB is exacerbated by PO intakes. She states that it is a "smothering" feeling. Physical assessment to upper body only at this time; shows no muscle or fat wasting, no edema.   Medications reviewed; 950 mg oral Ca citrate/day, 60 mg IV Solu-medrol BID, 40 mg oral Protonix BID. Labs reviewed; Na: 132 mmol/L, Cl: 96 mmol/L, creatinine: 1.04 mg/dL, Ca: 7.2 mg/dL, GFR: 58 mL/min.   IVF: NS @ 100 mL/hr.    Diet Order:  Diet regular Room service appropriate? Yes; Fluid consistency: Thin  Skin:  Reviewed, no issues  Last BM:  10/30  Height:   Ht Readings from Last 1 Encounters:  07/18/16 5\' 1"  (1.549 m)    Weight:   Wt Readings from Last 1 Encounters:  07/18/16 145 lb 1 oz (65.8 kg)    Ideal Body Weight:  47.73 kg  BMI:  Body mass index is 27.41 kg/m.  Estimated Nutritional Needs:   Kcal:  1315-1580 (20-24 kcal/kg)  Protein:  79-92 grams (1.2-1.4 grams/kg)  Fluid:  >/= 1.5 L/day  EDUCATION NEEDS:   No education needs identified at this time    13/01/17, MS, RD, LDN Inpatient Clinical Dietitian Pager # [phone] After hours/weekend pager # [phone]

## 2016-07-18 NOTE — H&P (Signed)
History and Physical    WINOLA DRUM YIF:[id number] DOB: 26-Nov-1960 DOA: 07/17/2016  Referring MD/NP/PA:   PCP: Lora Paula, MD   Patient coming from:  The patient is coming from home.  At baseline, pt dependent for most of ADL.   Chief Complaint: Fever, chills, shortness of breath, generalized weakness  HPI: RANELLE AUKER is a 55 y.o. female with medical history significant of COPD, hospice care, GERD, depression, anxiety, former alcoholism, psoriasis, dCHF, former smoker, who presents with fever, chills, shortness stress, generalized weakness.  Pt is under hospice care due to severe COPD. Patient states that she has been feeling bad in the past several days. She has generalized weakness, fever and chills. She has worsening shortness of breath, but denies worsening cough. She speaks in full sentence. She has mild CP which is not aggravated or alleviated by any known factors. Patient denies nausea, vomiting, diarrhea, abdominal pain, symptoms of a UTI or unilateral weakness. Per patient's daughter, patient stopped drinking alcohol 3 years ago.  ED Course: pt was found to have WBC 32.1, lactate 2.16, sodium 110, AKi with Cre 1.18, temperature 102.0, tachycardia, tachypnea. Chest x-ray showed right upper lobe infiltration. Pt is admitted to stepdown bed as inpatient.  Review of Systems:   General: has fevers, chills, no changes in body weight, has poor appetite, has fatigue HEENT: no blurry vision, hearing changes or sore throat Respiratory: has dyspnea, coughing, no wheezing CV: has mild chest pain, no palpitations GI: no nausea, vomiting, abdominal pain, diarrhea, constipation GU: no dysuria, burning on urination, increased urinary frequency, hematuria  Ext: no leg edema Neuro: no unilateral weakness, numbness, or tingling, no vision change or hearing loss Skin: has psoriatic rashes, no skin tear. MSK: No muscle spasm, no deformity, no limitation of range of movement in  spin Heme: No easy bruising.  Travel history: No recent long distant travel.  Allergy:  Allergies  Allergen Reactions  . Augmentin [Amoxicillin-Pot Clavulanate] Nausea Only    Has patient had a PCN reaction causing immediate rash, facial/tongue/throat swelling, SOB or lightheadedness with hypotension: No Has patient had a PCN reaction causing severe rash involving mucus membranes or skin necrosis: No Has patient had a PCN reaction that required hospitalization No Has patient had a PCN reaction occurring within the last 10 years: Yes If all of the above answers are "NO", then may proceed with Cephalosporin use.   . Cefdinir Other (See Comments)    Aches, "heart fluttering"  . Doxycycline Nausea And Vomiting  . Other Nausea Only    PAIN MEDICATIONS-nausea  . Codeine Nausea Only  . Fexofenadine Nausea Only  . Hydralazine     Side effect Felt woozy    Past Medical History:  Diagnosis Date  . Alcoholism (HCC)   . Asthma   . COPD (chronic obstructive pulmonary disease) (HCC)   . Depression   . Oxygen dependent    home oxygen 3L/min  . Poor dentition   . Psoriasis   . Seasonal allergies   . Urinary, incontinence, stress female     Past Surgical History:  Procedure Laterality Date  . CESAREAN SECTION     x 2  . TUBAL LIGATION      Social History:  reports that she quit smoking about 2 years ago. Her smoking use included Cigarettes. She started smoking about 45 years ago. She has a 40.00 pack-year smoking history. She has never used smokeless tobacco. She reports that she does not drink alcohol or use drugs.  Family History:  Family History  Problem Relation Age of Onset  . Lung cancer Father   . COPD Father      Prior to Admission medications   Medication Sig Start Date End Date Taking? Authorizing Provider  predniSONE (DELTASONE) 10 MG tablet TAKE 2 TABLETS (20 MG TOTAL) BY MOUTH DAILY WITH BREAKFAST. 06/11/16  Yes Josalyn Funches, MD  SPIRIVA HANDIHALER 18 MCG  inhalation capsule PLACE 1 CAPSULE (18 MCG TOTAL) INTO INHALER AND INHALE DAILY. 03/22/16  Yes Waymon Budge, MD  traZODone (DESYREL) 50 MG tablet TAKE 1 TABLET (50 MG TOTAL) BY MOUTH AT BEDTIME. 03/06/16  Yes Historical Provider, MD  ALPRAZolam Prudy Feeler) 1 MG tablet 1 tab, three times daily and PRN at bedtime 05/24/16   Waymon Budge, MD  Calcium Citrate 200 MG TABS Take 2 tablets (400 mg total) by mouth daily. 06/19/16   Josalyn Funches, MD  chlorpheniramine-HYDROcodone (TUSSIONEX) 10-8 MG/5ML SUER Take 5 mLs by mouth every 12 (twelve) hours. 05/01/16   Waymon Budge, MD  fluticasone (FLONASE) 50 MCG/ACT nasal spray PLACE 2 SPRAYS INTO BOTH NOSTRILS DAILY. 10/28/15   Waymon Budge, MD  Fluticasone-Salmeterol (ADVAIR DISKUS) 500-50 MCG/DOSE AEPB INHALE 1 PUFF INTO THE LUNGS 2 TIMES DAILY 07/03/16   Waymon Budge, MD  ibuprofen (ADVIL,MOTRIN) 200 MG tablet Take 200 mg by mouth every 6 (six) hours as needed for fever, headache, mild pain, moderate pain or cramping.    Historical Provider, MD  levalbuterol [facility] HFA) 45 MCG/ACT inhaler Inhale 2 puffs into the lungs every 4 (four) hours as needed for wheezing or shortness of breath. 03/02/16   Dessa Phi, MD  levalbuterol Pauline Aus) 0.63 MG/3ML nebulizer solution Take 77ml every 4 hours and as needed 05/08/16   Waymon Budge, MD  Morphine Sulfate (MORPHINE CONCENTRATE) 10 mg / 0.5 ml concentrated solution Take 0.13 mLs (2.6 mg total) by mouth every 6 (six) hours as needed for moderate pain. 2.5mg  po q6h prn for sob 06/06/16   Costin Otelia Sergeant, MD  pantoprazole (PROTONIX) 20 MG tablet TAKE 1 TABLET (20 MG TOTAL) BY MOUTH DAILY BEFORE SUPPER. 02/28/16   Waymon Budge, MD  predniSONE (DELTASONE) 10 MG tablet Take 2 tablets (20 mg total) by mouth daily with breakfast. Patient not taking: Reported on 07/17/2016 04/25/16   Waymon Budge, MD  Vitamin D, Ergocalciferol, (DRISDOL) 50000 units CAPS capsule Take 1 capsule (50,000 Units total) by mouth every 30  (thirty) days. Patient not taking: Reported on 07/17/2016 06/19/16   Dessa Phi, MD    Physical Exam: Vitals:   07/17/16 2300 07/18/16 0030 07/18/16 0100 07/18/16 0101  BP: 116/79 101/86 (!) 120/101   Pulse: (!) 132 (!) 136    Resp: 17 21 17    Temp:    101.4 F (38.6 C)  TempSrc:    Oral  SpO2: 100% 97%     General: Not in acute distress HEENT:       Eyes: PERRL, EOMI, no scleral icterus.       ENT: No discharge from the ears and nose, no pharynx injection, no tonsillar enlargement.        Neck: No JVD, no bruit, no mass felt. Heme: No neck lymph node enlargement. Cardiac: S1/S2, RRR, Tachycardia, No murmurs, No gallops or rubs. Respiratory: Has mild wheezing bilaterally. No rales or rubs. GI: Soft, nondistended, nontender, no rebound pain, no organomegaly, BS present. GU: No hematuria Ext: No pitting leg edema bilaterally. 2+DP/PT pulse bilaterally. Musculoskeletal: No joint  deformities, No joint redness or warmth, no limitation of ROM in spin. Skin: No rashes. Has psoriatic rashes. Neuro: Alert, oriented X3, cranial nerves II-XII grossly intact, moves all extremities normally. Psych: Patient is not psychotic, no suicidal or hemocidal ideation.  Labs on Admission: I have personally reviewed following labs and imaging studies  CBC:  Recent Labs Lab 07/17/16 2219  WBC 32.1*  NEUTROABS 28.6*  HGB 12.1  HCT 37.8  MCV 87.9  PLT 288   Basic Metabolic Panel:  Recent Labs Lab 07/17/16 2219  NA 130*  K 4.0  CL 84*  CO2 35*  GLUCOSE 91  BUN 17  CREATININE 1.18*  CALCIUM 8.6*   GFR: CrCl cannot be calculated (Unknown ideal weight.). Liver Function Tests:  Recent Labs Lab 07/17/16 2219  AST 20  ALT 14  ALKPHOS 54  BILITOT 1.3*  PROT 7.4  ALBUMIN 3.8   No results for input(s): LIPASE, AMYLASE in the last 168 hours. No results for input(s): AMMONIA in the last 168 hours. Coagulation Profile: No results for input(s): INR, PROTIME in the last 168  hours. Cardiac Enzymes: No results for input(s): CKTOTAL, CKMB, CKMBINDEX, TROPONINI in the last 168 hours. BNP (last 3 results) No results for input(s): PROBNP in the last 8760 hours. HbA1C: No results for input(s): HGBA1C in the last 72 hours. CBG: No results for input(s): GLUCAP in the last 168 hours. Lipid Profile: No results for input(s): CHOL, HDL, LDLCALC, TRIG, CHOLHDL, LDLDIRECT in the last 72 hours. Thyroid Function Tests: No results for input(s): TSH, T4TOTAL, FREET4, T3FREE, THYROIDAB in the last 72 hours. Anemia Panel: No results for input(s): VITAMINB12, FOLATE, FERRITIN, TIBC, IRON, RETICCTPCT in the last 72 hours. Urine analysis:    Component Value Date/Time   COLORURINE YELLOW 01/17/2016 1508   APPEARANCEUR CLEAR 01/17/2016 1508   LABSPEC 1.003 (L) 01/17/2016 1508   PHURINE 7.0 01/17/2016 1508   GLUCOSEU NEGATIVE 01/17/2016 1508   HGBUR NEGATIVE 01/17/2016 1508   BILIRUBINUR NEGATIVE 01/17/2016 1508   KETONESUR NEGATIVE 01/17/2016 1508   PROTEINUR NEGATIVE 01/17/2016 1508   UROBILINOGEN 0.2 09/19/2010 1112   NITRITE NEGATIVE 01/17/2016 1508   LEUKOCYTESUR NEGATIVE 01/17/2016 1508   Sepsis Labs: @LABRCNTIP (procalcitonin:4,lacticidven:4) )No results found for this or any previous visit (from the past 240 hour(s)).   Radiological Exams on Admission: Dg Chest 2 View  Result Date: 07/17/2016 CLINICAL DATA:  Acute onset of shortness of breath and fatigue. Initial encounter. EXAM: CHEST  2 VIEW COMPARISON:  Chest radiograph and CT of the chest performed 06/04/2016 FINDINGS: New right upper lobe airspace opacity is suspicious for pneumonia. Underlying interstitial prominence is noted. No pleural effusion or pneumothorax is seen. The known left-sided pulmonary nodules are not well characterized on radiograph. The heart remains normal in size. No acute osseous abnormalities are identified. IMPRESSION: New right upper lobe airspace opacity is suspicious for pneumonia.  Electronically Signed   By: 06/06/2016 M.D.   On: 07/17/2016 22:52     EKG: Independently reviewed. Sinus tachycardia, QTC 437, borderline LAD.  Assessment/Plan Principal Problem:   Acute on chronic respiratory failure with hypoxia (HCC) Active Problems:   Hyponatremia   Chronic diastolic heart failure (HCC)   Chronic respiratory failure with hypoxia (HCC)   Sepsis (HCC)   DNR (do not resuscitate) discussion   COPD exacerbation (HCC)   GERD (gastroesophageal reflux disease)   HCAP (healthcare-associated pneumonia)   AKI (acute kidney injury) (HCC)   Acute on chronic respiratory failure with hypoxia: Likely due to combination  of COPD exacerbation and  HCAP. Aspiration pneumonia is also possible given RUL infiltration. Pt is septic with elevated lactate, leukocytosis, fever, tachycardia and tachypnea. Currently hemodynamically stable.  - Will admit to SUD as inpt. - IV Vancomycin, Aztreonam and levaquin - tussionux for cough  - Atrovent Nebs and Xopenex Neb prn for SOB - Urine legionella and S. pneumococcal antigen - Follow up blood culture x2, sputum culture and respiratory virus panel, plus Flu pcr - will get Procalcitonin and trend lactic acid level per sepsis protocol - IVF: 2.5L of NS bolus in ED, followed by 100 mL per hour of NS  -SLP in AM  Sepsis:  -see above  COPD exacerbation and hospice care: -see above -continue morphine concentrate -Solu-Medrol 60 mg twice a day  Hyponatremia: Na 130. Likely due to decreased oral intake. -will check urine sodium, urine osmolality, serum osmolality, TSH -IVF as above -BMP q6h  AKI: Likely due to prerenal secondary to dehydration and continuation of NSAIDs - IVF as above - Check FeNa - Follow up renal function by BMP - Hold ibuprofen  Chronic diastolic heart failure: 2-D echo 12/05/15 showed EF 60-65 percent with grade 1 diastolic dysfunction. Patient does not have leg edema or JVD. CHF is compensated. Patient is not  taking diuretics at home. -Check BNP -Watch volume status closely while patient is receiving IV fluid resuscitation for sepsis  GERD: -Protonix  Sepsis - Reassessment   Performed at:    1:04 AM   Last Vitals:    Blood pressure 116/79, pulse (!) 132, temperature 101.4 F (38.6 C), temperature source Oral, resp. rate 17, SpO2 100 %.  Heart:                  Regular rhythm Tachchycarida  Lungs:     Has wheezing bilaterally   Capillary Refill:   <2 seconds    Peripheral Pulse (include location): Brachial pulse palpable   Skin (include color):             Normal     DVT ppx: SQ Lovenox Code Status: DNR Family Communication: Yes, patient's daughter at bed side Disposition Plan:  Anticipate discharge back to previous home environment Consults called: none  Admission status: SDU/inpation       Date of Service 07/18/2016    Lorretta Harp [facility] Pager [phone]  If 7PM-7AM, please contact night-coverage [email or web address] Password [email or web address] 07/18/2016, 1:04 AM

## 2016-07-18 NOTE — Progress Notes (Signed)
[facility] Social Work note Patient was resting in bed, yet reports that she sat up for some time and needed to lay back down. She appears drowsy and unable to fully finish thoughts before closing her eyes and falling asleep. No family present. Patient shares that she has pneumonia and feels bad. She voiced plan to return home upon discharge. 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 Support offered. [place]son [place], [place]  [phone]

## 2016-07-18 NOTE — Progress Notes (Signed)
Pharmacy Antibiotic Note  Wendy [name] is a 55 y.o. female admitted on 07/17/2016 with pneumonia.  Pharmacy has been consulted for vancomycin and levofloxacin dosing.  Patient also on aztreonam  D1 antibotics - renal: SCr improved - Tm = 102 - WBC elevated last evening  Plan:  For improved renal function, change Vancomycin 500mg  IV q12h (VT=15-20 mg/L)  Azactam 2Gm IV q8h  Levaquin 750mg  IV q24h  Patient with documented intolerance to Amox/clav,  She was given several doses of zosyn in 2015.  Consider therapy modification  Height: 5\' 1"  (154.9 cm) Weight: 145 lb 1 oz (65.8 kg) IBW/kg (Calculated) : 47.8  Temp (24hrs), Avg:100.9 F (38.3 C), Min:98.6 F (37 C), Max:102 F (38.9 C)   Recent Labs Lab 07/17/16 2219 07/17/16 2231 07/18/16 0249 07/18/16 0604  WBC 32.1*  --   --   --   CREATININE 1.18*  --  0.91  --   LATICACIDVEN  --  2.16* 1.7 0.9    Estimated Creatinine Clearance: 59.2 mL/min (by C-G formula based on SCr of 0.91 mg/dL).    Allergies  Allergen Reactions  . Augmentin [Amoxicillin-Pot Clavulanate] Nausea Only    Has patient had a PCN reaction causing immediate rash, facial/tongue/throat swelling, SOB or lightheadedness with hypotension: No Has patient had a PCN reaction causing severe rash involving mucus membranes or skin necrosis: No Has patient had a PCN reaction that required hospitalization No Has patient had a PCN reaction occurring within the last 10 years: Yes If all of the above answers are "NO", then may proceed with Cephalosporin use.   . Cefdinir Other (See Comments)    Aches, "heart fluttering"  . Doxycycline Nausea And Vomiting  . Other Nausea Only    PAIN MEDICATIONS-nausea  . Codeine Nausea Only  . Fexofenadine Nausea Only  . Hydralazine     Side effect Felt woozy    Antimicrobials this admission: 10/31 levaquin >>  11/1 vancomycin >>  11/1 azactam >>  Dose adjustments this admission:  11/1 vanco to 500mg  q12h for  improved SCr  Microbiology results:  11/1 BCx: pending 11/1 UCx: pending  11/1 MRSA PCR: positive 11/1: flu PCR: neg 11/1 resp virus panel: pending  Thank you for allowing pharmacy to be a part of this patient's care.  13/1, PharmD, BCPS.   Pager: 13/1 07/18/2016 9:23 AM

## 2016-07-18 NOTE — Evaluation (Signed)
Occupational Therapy Evaluation Patient Details Name: Wendy [name] MRN: [id number] DOB: 03-02-1961 Today's Date: 07/18/2016    History of Present Illness Wendy [name] is a 55 y.o. female with medical history significant of COPD, hospice care, GERD, depression, anxiety, former alcoholism, psoriasis, dCHF, former smoker, who presents with fever, chills, shortness stress, generalized weakness.   Clinical Impression   Pt admitted with generalized weakness. Pt currently with functional limitations due to the deficits listed below (see OT Problem List).  Pt will benefit from skilled OT to increase their safety and independence with ADL and functional mobility for ADL to facilitate discharge to venue listed below.      Follow Up Recommendations  No OT follow up    Equipment Recommendations  None recommended by OT       Precautions / Restrictions Precautions Precautions: Fall      Mobility Bed Mobility Overal bed mobility: Needs Assistance Bed Mobility: Supine to Sit     Supine to sit: Mod assist;+2 for safety/equipment        Transfers Overall transfer level: Needs assistance Equipment used: Rolling walker (2 wheeled) Transfers: Sit to/from [employer] Sit to Stand: Mod assist;+2 safety/equipment Stand pivot transfers: Mod assist;+2 safety/equipment                 ADL Overall ADL's : Needs assistance/impaired Eating/Feeding: Set up;Sitting   Grooming: Minimal assistance;Sitting   Upper Body Bathing: Moderate assistance;Sitting   Lower Body Bathing: Sit to/from stand;Cueing for safety;Cueing for sequencing;Cueing for compensatory techniques   Upper Body Dressing : Minimal assistance;Sitting   Lower Body Dressing: Maximal assistance;Sit to/from stand;Cueing for safety;Cueing for sequencing;Cueing for compensatory techniques   Toilet Transfer: +2 for safety/equipment;Moderate assistance;RW Toilet Transfer Details (indicate cue type  and reason): bed to chair                           Pertinent Vitals/Pain Pain Assessment: No/denies pain     Hand Dominance Right   Extremity/Trunk Assessment Upper Extremity Assessment Upper Extremity Assessment: Generalized weakness           Communication Communication Communication: No difficulties   Cognition Arousal/Alertness: Awake/alert Behavior During Therapy: WFL for tasks assessed/performed Overall Cognitive Status: Within Functional Limits for tasks assessed                                Home Living Family/patient expects to be discharged to:: Private residence Living Arrangements: Spouse/significant other Available Help at Discharge: Family;Available 24 hours/day Type of Home: House Home Access: Stairs to enter     Home Layout: One level     Bathroom Shower/Tub: Tub/shower unit;Walk-in Human resources officer: Standard     Home Equipment: Hospital bed;Walker - 4 wheels;Bedside commode;Shower seat   Additional Comments: pt often sponge bathes due to 02 demand      Prior Functioning/Environment Level of Independence: Needs assistance  Gait / Transfers Assistance Needed: Walks with rollator RW     Comments: Her companion and son able to assist with IADLs.        OT Problem List: Decreased strength;Decreased activity tolerance;Cardiopulmonary status limiting activity   OT Treatment/Interventions: Self-care/ADL training;DME and/or AE instruction;Energy conservation;Patient/family education    OT Goals(Current goals can be found in the care plan section) Acute Rehab OT Goals Patient Stated Goal: home when feel better OT Goal Formulation: With patient Time For Goal  Achievement:   OT Frequency: Min 2X/week              End of Session Equipment Utilized During Treatment: Rolling walker;Oxygen Nurse Communication: Mobility status  Activity Tolerance: Patient tolerated treatment well Patient left: in  chair;with call bell/phone within reach;with family/visitor present   Time: 6967-8938 OT Time Calculation (min): 34 min Charges:  OT General Charges $OT Visit: 1 Procedure OT Evaluation $OT Eval Moderate Complexity: 1 Procedure G-Codes:    Alba Cory 2016-08-07, 12:48 PM

## 2016-07-18 NOTE — Care Management Note (Signed)
Case Management Note  Patient Details  Name: Wendy [name] MRN: [id number] Date of Birth: Nov 14, 1960  Subjective/Objective:      Copd, pneumonia              Action/Plan: home with hospice services already present in the home   Expected Discharge Date:                  Expected Discharge Plan:  Home w Hospice Care  In-House Referral:  Hospice / Palliative Care  Discharge planning Services  CM Consult  Post Acute Care Choice:    Choice offered to:     DME Arranged:    DME Agency:     HH Arranged:    HH Agency:  [facility]  Status of Service:  In process, will continue to follow  If discussed at Long Length of Stay Meetings, dates discussed:    Additional Comments: Date:  July 18, 2016 Chart reviewed for concurrent status and case management needs. Will continue to follow the patient for status change: Discharge Planning: following for needs Expected discharge date: [id number] Marcelle Smiling, BSN, [place], [place]   [phone] Golda Acre, RN 07/18/2016, 8:50 AM

## 2016-07-18 NOTE — Progress Notes (Signed)
Wendy [name] room 1236 [facility] RN note This is a GIP related and covered admission of 07/18/16, per Dr. Anne Fu.  Patient has an [facility] diagnosis of COPD.  Code status while at home was Full code.  Current code status in hospital is DNR.  Patient came to the hospital with fever, chills and shortness of breath.    Visited patient at bedside.  There are no family members or visitors present.  She is awake and alert.  She is drowsy.  She states she has family at home to help her when she returns.    Per chart review, patient has received Morphine 2.6 mg po x 1 for sternal pain. Patient reports this also helped with her shortness of breath.  Patient has received tylenol x 1 for fever.    Spoke with patient's daughter, [name], by telephone.  She states plan is for patient to return home, when ready.  She states she wants patient to receive IV antibiotics to fight the infection.    [facility] RN will continue to follow daily and assist as needed.  Kristine Garbe, RN, BSN [phone]

## 2016-07-19 DIAGNOSIS — J9611 Chronic respiratory failure with hypoxia: Secondary | ICD-10-CM

## 2016-07-19 DIAGNOSIS — J441 Chronic obstructive pulmonary disease with (acute) exacerbation: Secondary | ICD-10-CM

## 2016-07-19 DIAGNOSIS — A419 Sepsis, unspecified organism: Principal | ICD-10-CM

## 2016-07-19 DIAGNOSIS — E871 Hypo-osmolality and hyponatremia: Secondary | ICD-10-CM

## 2016-07-19 LAB — BASIC METABOLIC PANEL
ANION GAP: 11 (ref 5–15)
BUN: 14 mg/dL (ref 6–20)
CALCIUM: 7.8 mg/dL — AB (ref 8.9–10.3)
CO2: 27 mmol/L (ref 22–32)
CREATININE: 0.71 mg/dL (ref 0.44–1.00)
Chloride: 97 mmol/L — ABNORMAL LOW (ref 101–111)
GLUCOSE: 101 mg/dL — AB (ref 65–99)
Potassium: 4 mmol/L (ref 3.5–5.1)
Sodium: 135 mmol/L (ref 135–145)

## 2016-07-19 LAB — CBC
HCT: 31.4 % — ABNORMAL LOW (ref 36.0–46.0)
HEMOGLOBIN: 9.8 g/dL — AB (ref 12.0–15.0)
MCH: 27.8 pg (ref 26.0–34.0)
MCHC: 31.2 g/dL (ref 30.0–36.0)
MCV: 89.2 fL (ref 78.0–100.0)
PLATELETS: 250 10*3/uL (ref 150–400)
RBC: 3.52 MIL/uL — ABNORMAL LOW (ref 3.87–5.11)
RDW: 13.8 % (ref 11.5–15.5)
WBC: 23.1 10*3/uL — ABNORMAL HIGH (ref 4.0–10.5)

## 2016-07-19 LAB — PROTIME-INR
INR: 1.34
PROTHROMBIN TIME: 16.7 s — AB (ref 11.4–15.2)

## 2016-07-19 LAB — URINE CULTURE

## 2016-07-19 MED ORDER — PREDNISONE 20 MG PO TABS
60.0000 mg | ORAL_TABLET | Freq: Every day | ORAL | Status: DC
  Administered 2016-07-20: 60 mg via ORAL
  Filled 2016-07-19: qty 3

## 2016-07-19 MED ORDER — MUPIROCIN 2 % EX OINT
1.0000 "application " | TOPICAL_OINTMENT | Freq: Two times a day (BID) | CUTANEOUS | Status: DC
  Administered 2016-07-19 – 2016-07-23 (×9): 1 via NASAL
  Filled 2016-07-19 (×2): qty 22

## 2016-07-19 MED ORDER — CHLORHEXIDINE GLUCONATE CLOTH 2 % EX PADS
6.0000 | MEDICATED_PAD | Freq: Every day | CUTANEOUS | Status: DC
  Administered 2016-07-19 – 2016-07-22 (×4): 6 via TOPICAL

## 2016-07-19 MED ORDER — LEVOFLOXACIN 750 MG PO TABS
750.0000 mg | ORAL_TABLET | Freq: Every day | ORAL | Status: DC
  Administered 2016-07-19 – 2016-07-21 (×3): 750 mg via ORAL
  Filled 2016-07-19 (×3): qty 1

## 2016-07-19 MED ORDER — PHYTONADIONE 5 MG PO TABS
5.0000 mg | ORAL_TABLET | Freq: Once | ORAL | Status: AC
  Administered 2016-07-19: 5 mg via ORAL
  Filled 2016-07-19: qty 1

## 2016-07-19 NOTE — Progress Notes (Signed)
Tried BIPAP on patient . The mask was way too big due to patient having a small face. Patient stated that she would get someone to bring her home mask so she can wear it tonight. Patient has on 3 L of oxygen and is comfortable.RT will continue to monitor

## 2016-07-19 NOTE — Progress Notes (Signed)
PROGRESS NOTE    Wendy [name]  VOP:[id number] DOB: 03-10-1961 DOA: 07/17/2016  PCP: Lora Paula, MD   Brief Narrative:  Wendy [name] is a 55 y.o. female with medical history significant of COPD under hospice care, GERD, depression, anxiety, former alcoholism, psoriasis, dCHF, former smoker, who presents with fever, chills, shortness of breath, generalized weakness and right upper chest pain for past few days.   She had a recent admission from 9/18- 9/20 for traumatic T 12 burst fracture.   Subjective: No complaints today.   Assessment & Plan:   Principal Problem:   Acute on chronic respiratory failure with hypoxia/ severe sepsis (A)  HCAP (healthcare-associated pneumonia) - RUL with symptoms of dyspnea and fever (102)- tachycardic- Lactic acid 2.16- has improved- procalcitonin 1.96 - currently on Vanc, Azactam and Levaquin due to PCN allergy but in the past she has been able to tolerate Zosyn - MRSA PCR + so continue Vanc - current regimen will provide coverage for atypicals and therefore will continue this - SLP eval to assess whether she has issues with silent aspiration - influenza and resp viral panel negative  -  blood cultures NGTD  (B) COPD exacerbation - on 3 L of O 2 at baseline which has not changed - currently no wheezing - taper steroids- cont Nebs - on hospice for severe COPD and on 20 mg of Prednisone daily at baseline - oral Morphine PRN  Active Problems:   Hyponatremia/ mild AKI - due to dehydration-improved with NS at 100 cc/hr- continue  SVT  - occurred yesterday after she got back to bed from the chair - resolved after Adenosine- ECHO done in March and therefore not repeated - continue to follow on telemetry  Traumatic T12 fracture on 9/18 - no back pain  Thrush - Nystatin- follow  Elevated PT/ INR - recheckd  - mildly elevated still at 16.7/ 1.34- will 5 mg oral Vit K    Chronic diastolic heart failure - grade 1 diastolic  dysfunction on 3/17    GERD (gastroesophageal reflux disease) - Protonix  Lung nodules  - 2 nodules noted on CT on 9/17   DVT prophylaxis: Lovenox Code Status: DNR Family Communication:  Disposition Plan: tx to telemetry  Consultants:    Procedures:    Antimicrobials:  Anti-infectives    Start     Dose/Rate Route Frequency Ordered Stop   07/18/16 2200  levofloxacin (LEVAQUIN) IVPB 750 mg     750 mg 100 mL/hr over 90 Minutes Intravenous Every 24 hours 07/18/16 0049     07/18/16 1200  vancomycin (VANCOCIN) 500 mg in sodium chloride 0.9 % 100 mL IVPB     500 mg 100 mL/hr over 60 Minutes Intravenous 2 times daily 07/18/16 0921     07/18/16 0200  aztreonam (AZACTAM) 2 g in dextrose 5 % 50 mL IVPB     2 g 100 mL/hr over 30 Minutes Intravenous Every 8 hours 07/18/16 0053 07/26/16 0159   07/18/16 0200  vancomycin (VANCOCIN) IVPB 1000 mg/200 mL premix  Status:  Discontinued     1,000 mg 200 mL/hr over 60 Minutes Intravenous Every 24 hours 07/18/16 0058 07/18/16 0921   07/17/16 2300  levofloxacin (LEVAQUIN) IVPB 750 mg     750 mg 100 mL/hr over 90 Minutes Intravenous  Once 07/17/16 2255 07/18/16 0100       Objective: Vitals:   07/19/16 0827 07/19/16 0902 07/19/16 0950 07/19/16 1000  BP: (!) 142/67  129/71 (!) 157/83  Pulse:  93  96 (!) 104  Resp: 16  (!) 23 14  Temp:      TempSrc:      SpO2: 99% 97% 96% 94%  Weight:      Height:        Intake/Output Summary (Last 24 hours) at 07/19/16 1144 Last data filed at 07/19/16 1100  Gross per 24 hour  Intake             3750 ml  Output             1350 ml  Net             2400 ml   Filed Weights   07/18/16 0200 07/18/16 0322  Weight: 62.5 kg (137 lb 12.6 oz) 65.8 kg (145 lb 1 oz)    Examination: General exam: Appears comfortable  HEENT: PERRLA, oral mucosa moist, no sclera icterus - thrush has resolved Respiratory system: Clear to auscultation. Respiratory effort normal. Pulse ox 100% on 3 L Cardiovascular system: S1  & S2 heard, RRR.  No murmurs  Gastrointestinal system: Abdomen soft, non-tender, nondistended. Normal bowel sound. No organomegaly Central nervous system: Alert and oriented. No focal neurological deficits. Extremities: No cyanosis, clubbing or edema Skin: No rashes or ulcers Psychiatry:  Mood & affect appropriate.     Data Reviewed: I have personally reviewed following labs and imaging studies  CBC:  Recent Labs Lab 07/17/16 2219 07/19/16 0326  WBC 32.1* 23.1*  NEUTROABS 28.6*  --   HGB 12.1 9.8*  HCT 37.8 31.4*  MCV 87.9 89.2  PLT 288 250   Basic Metabolic Panel:  Recent Labs Lab 07/17/16 2219 07/18/16 0249 07/18/16 0904 07/19/16 0326  NA 130* 131* 132* 135  K 4.0 3.9 4.5 4.0  CL 84* 92* 96* 97*  CO2 35* 30 28 27   GLUCOSE 91 120* 111* 101*  BUN 17 16 15 14   CREATININE 1.18* 0.91 1.04* 0.71  CALCIUM 8.6* 7.4* 7.2* 7.8*   GFR: Estimated Creatinine Clearance: 67.4 mL/min (by C-G formula based on SCr of 0.71 mg/dL). Liver Function Tests:  Recent Labs Lab 07/17/16 2219  AST 20  ALT 14  ALKPHOS 54  BILITOT 1.3*  PROT 7.4  ALBUMIN 3.8   No results for input(s): LIPASE, AMYLASE in the last 168 hours. No results for input(s): AMMONIA in the last 168 hours. Coagulation Profile:  Recent Labs Lab 07/18/16 0249 07/19/16 0326  INR 1.51 1.34   Cardiac Enzymes: No results for input(s): CKTOTAL, CKMB, CKMBINDEX, TROPONINI in the last 168 hours. BNP (last 3 results) No results for input(s): PROBNP in the last 8760 hours. HbA1C: No results for input(s): HGBA1C in the last 72 hours. CBG: No results for input(s): GLUCAP in the last 168 hours. Lipid Profile: No results for input(s): CHOL, HDL, LDLCALC, TRIG, CHOLHDL, LDLDIRECT in the last 72 hours. Thyroid Function Tests:  Recent Labs  07/18/16 0604  TSH 0.528   Anemia Panel: No results for input(s): VITAMINB12, FOLATE, FERRITIN, TIBC, IRON, RETICCTPCT in the last 72 hours. Urine analysis:      Component Value Date/Time   COLORURINE AMBER (A) 07/18/2016 0810   APPEARANCEUR CLOUDY (A) 07/18/2016 0810   LABSPEC 1.017 07/18/2016 0810   PHURINE 6.0 07/18/2016 0810   GLUCOSEU NEGATIVE 07/18/2016 0810   HGBUR NEGATIVE 07/18/2016 0810   BILIRUBINUR SMALL (A) 07/18/2016 0810   KETONESUR NEGATIVE 07/18/2016 0810   PROTEINUR 30 (A) 07/18/2016 0810   UROBILINOGEN 0.2 09/19/2010 1112   NITRITE NEGATIVE 07/18/2016 0810  LEUKOCYTESUR SMALL (A) 07/18/2016 0810   Sepsis Labs: @LABRCNTIP (procalcitonin:4,lacticidven:4) ) Recent Results (from the past 240 hour(s))  Blood Culture (routine x 2)     Status: None (Preliminary result)   Collection Time: 07/17/16 10:18 PM  Result Value Ref Range Status   Specimen Description BLOOD LEFT ANTECUBITAL  Final   Special Requests BOTTLES DRAWN AEROBIC AND ANAEROBIC 5CC  Final   Culture   Final    NO GROWTH < 24 HOURS Performed at [facility]    Report Status PENDING  Incomplete  Blood Culture (routine x 2)     Status: None (Preliminary result)   Collection Time: 07/17/16 10:20 PM  Result Value Ref Range Status   Specimen Description BLOOD RIGHT ANTECUBITAL  Final   Special Requests BOTTLES DRAWN AEROBIC AND ANAEROBIC 5CC  Final   Culture   Final    NO GROWTH < 24 HOURS Performed at [facility]    Report Status PENDING  Incomplete  MRSA PCR Screening     Status: Abnormal   Collection Time: 07/18/16  2:11 AM  Result Value Ref Range Status   MRSA by PCR POSITIVE (A) NEGATIVE Final    Comment:        The GeneXpert MRSA Assay (FDA approved for NASAL specimens only), is one component of a comprehensive MRSA colonization surveillance program. It is not intended to diagnose MRSA infection nor to guide or monitor treatment for MRSA infections. RESULT CALLED TO, READ BACK BY AND VERIFIED WITH: J MCNULTY RN 0503 07/18/16 A NAVARRO   Respiratory Panel by PCR     Status: None   Collection Time: 07/18/16  2:11 AM  Result Value  Ref Range Status   Adenovirus NOT DETECTED NOT DETECTED Final   Coronavirus 229E NOT DETECTED NOT DETECTED Final   Coronavirus HKU1 NOT DETECTED NOT DETECTED Final   Coronavirus NL63 NOT DETECTED NOT DETECTED Final   Coronavirus OC43 NOT DETECTED NOT DETECTED Final   Metapneumovirus NOT DETECTED NOT DETECTED Final   Rhinovirus / Enterovirus NOT DETECTED NOT DETECTED Final   Influenza A NOT DETECTED NOT DETECTED Final   Influenza B NOT DETECTED NOT DETECTED Final   Parainfluenza Virus 1 NOT DETECTED NOT DETECTED Final   Parainfluenza Virus 2 NOT DETECTED NOT DETECTED Final   Parainfluenza Virus 3 NOT DETECTED NOT DETECTED Final   Parainfluenza Virus 4 NOT DETECTED NOT DETECTED Final   Respiratory Syncytial Virus NOT DETECTED NOT DETECTED Final   Bordetella pertussis NOT DETECTED NOT DETECTED Final   Chlamydophila pneumoniae NOT DETECTED NOT DETECTED Final   Mycoplasma pneumoniae NOT DETECTED NOT DETECTED Final    Comment: Performed at [facility]  Urine culture     Status: Abnormal   Collection Time: 07/18/16  8:10 AM  Result Value Ref Range Status   Specimen Description URINE, CLEAN CATCH  Final   Special Requests NONE  Final   Culture MULTIPLE SPECIES PRESENT, SUGGEST RECOLLECTION (A)  Final   Report Status 07/19/2016 FINAL  Final         Radiology Studies: Dg Chest 2 View  Result Date: 07/17/2016 CLINICAL DATA:  Acute onset of shortness of breath and fatigue. Initial encounter. EXAM: CHEST  2 VIEW COMPARISON:  Chest radiograph and CT of the chest performed 06/04/2016 FINDINGS: New right upper lobe airspace opacity is suspicious for pneumonia. Underlying interstitial prominence is noted. No pleural effusion or pneumothorax is seen. The known left-sided pulmonary nodules are not well characterized on radiograph. The heart remains  normal in size. No acute osseous abnormalities are identified. IMPRESSION: New right upper lobe airspace opacity is suspicious for pneumonia.  Electronically Signed   By: Roanna Raider M.D.   On: 07/17/2016 22:52      Scheduled Meds: . aztreonam  2 g Intravenous Q8H  . calcium citrate  950 mg Oral Daily  . Chlorhexidine Gluconate Cloth  6 each Topical Q0600  . chlorpheniramine-HYDROcodone  5 mL Oral Q12H  . enoxaparin (LOVENOX) injection  40 mg Subcutaneous Q24H  . fluticasone  1 spray Each Nare Daily  . ipratropium  0.5 mg Nebulization TID  . levalbuterol  0.63 mg Nebulization TID  . levofloxacin (LEVAQUIN) IV  750 mg Intravenous Q24H  . methylPREDNISolone (SOLU-MEDROL) injection  60 mg Intravenous Daily  . mupirocin ointment  1 application Nasal BID  . pantoprazole  40 mg Oral Daily  . traZODone  50 mg Oral QHS  . vancomycin  500 mg Intravenous BID   Continuous Infusions: . sodium chloride 1,000 mL (07/19/16 0514)     LOS: 1 day    Time spent in minutes: 35    Tyjai Charbonnet, MD [facility] Pager: [email or web address] Password [facility] 07/19/2016, 11:44 AM

## 2016-07-19 NOTE — Progress Notes (Signed)
Wendy [name] room 1236 [facility] Liaison note This is a GIP related and covered admission of 07/18/16, per Dr. Anne Fu. Patient has an [facility] diagnosis of COPD.  Code status while at home was Full code.  Current code status in hospital is DNR.  Patient came to the hospital with fever, chills and shortness of breath.    [facility] medication list and face sheet placed on shadow chart.    Visited patient at bedside.  There are no family members or visitors present.  Patient denies pain.  She states she is very drowsy because she just had a bath.    No documented meal intake this hospital admission.  Noted patient received Morphine 2.6 mg po x 1 this morning for pain.    Patient also received Adenosine 6 mg IV x 1 for SVT yesterday.     Spoke with staff RN, Audie Clear, who states patient will be transferring to another room today.    [facility] will continue to follow daily, and assist as needed.  Please contact [facility] at [phone] with changes in patient's condition.  Kristine Garbe, RN, BSN [phone]

## 2016-07-19 NOTE — Progress Notes (Signed)
Pt agreeable to BIPAP while daughter are here.  Pt tolerating well at this time.  RT to monitor and assess as needed.

## 2016-07-19 NOTE — Progress Notes (Signed)
Physical Therapy Treatment Patient Details Name: ZAREAH HUNZEKER MRN: [id number] DOB: [birth year] Today's Date: 07/19/2016    History of Present Illness AZHAR YOGI is a 55 y.o. female with medical history significant of COPD, hospice care, GERD, depression, anxiety, former alcoholism, psoriasis, dCHF, former smoker, who presents with fever, chills, shortness stress, generalized weakness.    PT Comments    Pt in bed on 3 lts nasal.  BP 129/71(86), HR 94, O2 96%, RR 13 Assisted OOB to BSC to void (550cc)   BP 157/83, HR 108, RA 90%, RR 18 Assisted with amb in hallway + 2 assist for safety 30 feet   BP 162/112, HR 108, O2 87-88%, RR 22 Pt required several standing rest breaks due to dyspnea but tolerated increased activity this session. Pt plans to return home    Follow Up Recommendations  Home health PT     Equipment Recommendations  None recommended by PT    Recommendations for Other Services       Precautions / Restrictions Precautions Precautions: Fall Precaution Comments: Home O2 3 lts Restrictions Weight Bearing Restrictions: No    Mobility  Bed Mobility Overal bed mobility: Needs Assistance Bed Mobility: Supine to Sit     Supine to sit: Mod assist;+2 for safety/equipment     General bed mobility comments: increased time and 25% VC's for direction   Pt sleepy/groggy  Transfers Overall transfer level: Needs assistance Equipment used: None Transfers: Sit to/from [employer] Sit to Stand: Mod assist;+2 safety/equipment Stand pivot transfers: Mod assist;+2 safety/equipment       General transfer comment: assisted to [facility] with 50% VC's on proper hand placement and safety with turn completion.  Assisted with hygiene due to impaired standing balance.   Ambulation/Gait   Ambulation Distance (Feet): 30 Feet Assistive device: Rolling walker (2 wheeled) Gait Pattern/deviations: Step-to pattern;Step-through pattern;Trunk flexed Gait velocity:  decr   General Gait Details: several standing rest breaks.  3/4 DOE.  Remained on 3 lts with rande 87-92% with activity.  HR 108.  Recliner following for safety.     Stairs            Wheelchair Mobility    Modified Rankin (Stroke Patients Only)       Balance                                    Cognition Arousal/Alertness: Awake/alert (sleepy/groggy) Behavior During Therapy: WFL for tasks assessed/performed Overall Cognitive Status: Within Functional Limits for tasks assessed                      Exercises      General Comments        Pertinent Vitals/Pain Pain Assessment: No/denies pain    Home Living                      Prior Function            PT Goals (current goals can now be found in the care plan section) Progress towards PT goals: Progressing toward goals    Frequency    Min 3X/week      PT Plan      Co-evaluation             End of Session Equipment Utilized During Treatment: Oxygen Activity Tolerance: Treatment limited secondary to medical complications (Comment) Patient left: in chair;with call  bell/phone within reach     Time: 0955-1023 PT Time Calculation (min) (ACUTE ONLY): 28 min  Charges:  $Gait Training: 8-22 mins $Therapeutic Activity: 8-22 mins                    G Codes:      Felecia Shelling  PTA [facility]  Acute  Rehab Pager      [phone]

## 2016-07-19 NOTE — Progress Notes (Signed)
Pt stated that she doesn't use CPAP a lot and home and does not want to wear it tonight.  RT to monitor and assess as needed.

## 2016-07-19 NOTE — Progress Notes (Signed)
SLP Cancellation Note  Patient Details Name: Wendy [name] MRN: [id number] DOB: Sep 23, 1960   Cancelled treatment:      BSE not indicated as pt is at baseline per her report and nursing report. No report of any aspiration or dysphagia with PO consumption. ST signing off.   Muath Hallam B. Dreama Saa M.S., CCC-SLP Speech-Language Pathologist

## 2016-07-20 DIAGNOSIS — R410 Disorientation, unspecified: Secondary | ICD-10-CM

## 2016-07-20 DIAGNOSIS — K219 Gastro-esophageal reflux disease without esophagitis: Secondary | ICD-10-CM

## 2016-07-20 DIAGNOSIS — Z7189 Other specified counseling: Secondary | ICD-10-CM

## 2016-07-20 LAB — CBC
HCT: 28.8 % — ABNORMAL LOW (ref 36.0–46.0)
HEMOGLOBIN: 9 g/dL — AB (ref 12.0–15.0)
MCH: 27.9 pg (ref 26.0–34.0)
MCHC: 31.3 g/dL (ref 30.0–36.0)
MCV: 89.2 fL (ref 78.0–100.0)
PLATELETS: 284 10*3/uL (ref 150–400)
RBC: 3.23 MIL/uL — AB (ref 3.87–5.11)
RDW: 13.7 % (ref 11.5–15.5)
WBC: 18.5 10*3/uL — AB (ref 4.0–10.5)

## 2016-07-20 LAB — BLOOD GAS, ARTERIAL
ACID-BASE EXCESS: 2.8 mmol/L — AB (ref 0.0–2.0)
BICARBONATE: 29.7 mmol/L — AB (ref 20.0–28.0)
DRAWN BY: 441261
O2 CONTENT: 2 L/min
O2 Saturation: 96.3 %
PATIENT TEMPERATURE: 98.6
pCO2 arterial: 63.9 mmHg — ABNORMAL HIGH (ref 32.0–48.0)
pH, Arterial: 7.29 — ABNORMAL LOW (ref 7.350–7.450)
pO2, Arterial: 96.3 mmHg (ref 83.0–108.0)

## 2016-07-20 LAB — PROTIME-INR
INR: 1.14
Prothrombin Time: 14.7 seconds (ref 11.4–15.2)

## 2016-07-20 LAB — LEGIONELLA PNEUMOPHILA SEROGP 1 UR AG: L. pneumophila Serogp 1 Ur Ag: NEGATIVE

## 2016-07-20 MED ORDER — LEVOFLOXACIN 750 MG PO TABS: 750.0000 mg | ORAL_TABLET | Freq: Every day | ORAL | 0 refills | Status: DC

## 2016-07-20 MED ORDER — ALPRAZOLAM 1 MG PO TABS
1.0000 mg | ORAL_TABLET | Freq: Every day | ORAL | Status: DC
  Administered 2016-07-21 – 2016-07-22 (×2): 1 mg via ORAL
  Filled 2016-07-20 (×3): qty 1

## 2016-07-20 MED ORDER — HYDROCOD POLST-CPM POLST ER 10-8 MG/5ML PO SUER: 5.0000 mL | Freq: Two times a day (BID) | ORAL | 0 refills | Status: DC | PRN

## 2016-07-20 MED ORDER — ALPRAZOLAM 1 MG PO TABS
1.0000 mg | ORAL_TABLET | Freq: Three times a day (TID) | ORAL | Status: DC | PRN
  Administered 2016-07-20 – 2016-07-23 (×6): 1 mg via ORAL
  Filled 2016-07-20 (×6): qty 1

## 2016-07-20 MED ORDER — LORAZEPAM 2 MG/ML IJ SOLN
2.0000 mg | Freq: Once | INTRAMUSCULAR | Status: AC
  Administered 2016-07-20: 2 mg via INTRAVENOUS
  Filled 2016-07-20: qty 1

## 2016-07-20 MED ORDER — SULFAMETHOXAZOLE-TRIMETHOPRIM 400-80 MG PO TABS: 1.0000 | ORAL_TABLET | Freq: Two times a day (BID) | ORAL | 0 refills | Status: AC

## 2016-07-20 MED ORDER — METHYLPREDNISOLONE SODIUM SUCC 125 MG IJ SOLR
60.0000 mg | Freq: Two times a day (BID) | INTRAMUSCULAR | Status: DC
  Administered 2016-07-20 (×2): 60 mg via INTRAVENOUS
  Filled 2016-07-20: qty 2

## 2016-07-20 MED ORDER — PREDNISONE 20 MG PO TABS: 60.0000 mg | ORAL_TABLET | Freq: Every day | ORAL | 0 refills | Status: DC

## 2016-07-20 NOTE — Progress Notes (Addendum)
Wendy [name] room 1434 [facility] Liaison note  This is a GIP related and covered admission of 07/18/16, per Dr. Anne Fu. Patient has an [facility] diagnosis of COPD. Code status while at home was Full code. Current code status in hospital is DNR. Patient came to the hospital with fever, chills and shortness of breath.   Visited patient at bedside.  Patient is confused.  She states there are red bugs on the ceiling and a hose on the counter.  She thinks she is at [employer].  Per staff RN, Corrie Dandy, plan is for patient to discharge home today.    Patient's children, [name] and [name] are at the bedside.  Per staff RN, patient will be moved to step-down, given high Co2 levels by abg.  Children are in agreement with this.  Discussed that this writer has seen patient every day since admission, and this is not her normal.    Noted patient has no documented meal intake since hospital admission.   Patient is receiving NACL 0.9% at 100 ml/hr via peripheral IV.  [facility] RN will continue to follow daily and assist as needed.  Please keep [facility] updated with changes in patient's condition. Thank you.    Kristine Garbe, RN, BSN [phone]

## 2016-07-20 NOTE — Progress Notes (Addendum)
PROGRESS NOTE    Wendy [name]  YQI:[id number] DOB: 02-23-1961 DOA: 07/17/2016  PCP: Lora Paula, MD   Brief Narrative:  Wendy [name] is a 55 y.o. female with medical history significant of COPD under hospice care, GERD, depression, anxiety, former alcoholism, psoriasis, dCHF, former smoker, who presents with fever, chills, shortness of breath, generalized weakness and right upper chest pain for past few days.   She had a recent admission from 9/18- 9/20 for traumatic T 12 burst fracture.   Subjective: No complaints today. According to daughters, she states to them that she is seeing bugs. The patient is able to recognize me and speaks clearly with me. Daughters state that she does not recognize them (although she does) and that this is not her baseline.   Assessment & Plan:   Principal Problem:   Acute on chronic respiratory failure with hypoxia/ severe sepsis (A)  HCAP (healthcare-associated pneumonia) - RUL with symptoms of dyspnea and fever (102)- tachycardic- Lactic acid 2.16- has improved- procalcitonin 1.96 - currently on Vanc, Azactam and Levaquin due to PCN allergy but in the past she has been able to tolerate Zosyn - MRSA PCR + so continue Vanc - current regimen will provide coverage for atypicals and therefore will continue this - SLP eval to assess whether she has issues with silent aspiration - influenza and resp viral panel negative  -  blood cultures NGTD  (B) COPD exacerbation - on 3 L of O 2 at baseline which has not changed - currently no wheezing - cont Nebs, steriods - on hospice for severe COPD and on 20 mg of Prednisone daily at baseline - oral Morphine PRN - now has ABG showing pH of 7.29 and PCO2 of 63 (which is her baseline)- will place on BiPAP and transfer to SDU per family request - palliative care consulted to again address GOC with patient and family  (C) OSA - has told me and tech that she does not always wear her CPAP at home- she  put it on last night about 11 PM on daughter's insistence   Active Problems:  Acute encephalopathy? Seeing bugs -  she is able to converse and remembers me each day. RN noted "confusion" last night which improved after BiPAP but no mention of seeing bugs. She did not tell me about her visual hallucinations either but apparently only told her daughters - per daughters, she takes Xanax 1mg  QID at home (perscription states TID PRN and QHS)- she has been always asleep (but alert and oriented when aroused) when I enter the room and due to risk for hypercapnia, it was not resumed in the daytime - also see daily notes from hospice RN ?[place] in regards to patient being sleepy when she evaluates her - will resume now on daughters' request- follow mental status  Hyponatremia/ mild AKI - due to dehydration-improved with NS at 100 cc/hr- continue  SVT  - occurred 11/1 after she got back to bed from the chair - resolved after Adenosine- ECHO done in March and therefore not repeated - continue to follow on telemetry  Traumatic T12 fracture on 9/18 - no back pain  Thrush - Nystatin- follow  Elevated PT/ INR - recheckd  - mildly elevated still at 16.7/ 1.34- will 5 mg oral Vit K    Chronic diastolic heart failure - grade 1 diastolic dysfunction on 3/17    GERD (gastroesophageal reflux disease) - Protonix  Lung nodules  - 2 nodules noted on CT  on 9/17   DVT prophylaxis: Lovenox Code Status: DNR Family Communication:  Disposition Plan: tx to telemetry  Consultants:    Procedures:    Antimicrobials:  Anti-infectives    Start     Dose/Rate Route Frequency Ordered Stop   07/20/16 0000  levofloxacin (LEVAQUIN) 750 MG tablet     750 mg Oral Daily 07/20/16 0837     07/20/16 0000  sulfamethoxazole-trimethoprim (BACTRIM) 400-80 MG tablet     1 tablet Oral 2 times daily 07/20/16 0838     07/19/16 2200  levofloxacin (LEVAQUIN) tablet 750 mg     750 mg Oral Daily 07/19/16 1146      07/18/16 2200  levofloxacin (LEVAQUIN) IVPB 750 mg  Status:  Discontinued     750 mg 100 mL/hr over 90 Minutes Intravenous Every 24 hours 07/18/16 0049 07/19/16 1146   07/18/16 1200  vancomycin (VANCOCIN) 500 mg in sodium chloride 0.9 % 100 mL IVPB     500 mg 100 mL/hr over 60 Minutes Intravenous 2 times daily 07/18/16 0921     07/18/16 0200  aztreonam (AZACTAM) 2 g in dextrose 5 % 50 mL IVPB     2 g 100 mL/hr over 30 Minutes Intravenous Every 8 hours 07/18/16 0053 07/26/16 0159   07/18/16 0200  vancomycin (VANCOCIN) IVPB 1000 mg/200 mL premix  Status:  Discontinued     1,000 mg 200 mL/hr over 60 Minutes Intravenous Every 24 hours 07/18/16 0058 07/18/16 0921   07/17/16 2300  levofloxacin (LEVAQUIN) IVPB 750 mg     750 mg 100 mL/hr over 90 Minutes Intravenous  Once 07/17/16 2255 07/18/16 0100       Objective: Vitals:   07/20/16 0450 07/20/16 1157 07/20/16 1233 07/20/16 1300  BP: (!) 173/92 (!) 163/79  115/64  Pulse: (!) 105 (!) 108 (!) 105 86  Resp: 20  15 12   Temp: 97.7 F (36.5 C)     TempSrc: Axillary     SpO2: 98% 96% 95% 98%  Weight:  65.9 kg (145 lb 4.5 oz)    Height:  5\' 1"  (1.549 m)      Intake/Output Summary (Last 24 hours) at 07/20/16 1458 Last data filed at 07/20/16 1300  Gross per 24 hour  Intake             3020 ml  Output             1800 ml  Net             1220 ml   Filed Weights   07/18/16 0200 07/18/16 0322 07/20/16 1157  Weight: 62.5 kg (137 lb 12.6 oz) 65.8 kg (145 lb 1 oz) 65.9 kg (145 lb 4.5 oz)    Examination: General exam: Appears comfortable  HEENT: PERRLA, oral mucosa moist, no sclera icterus - thrush has resolved Respiratory system: Clear to auscultation. Respiratory effort normal. Pulse ox 100% on 3 L Cardiovascular system: S1 & S2 heard, RRR.  No murmurs  Gastrointestinal system: Abdomen soft, non-tender, nondistended. Normal bowel sound. No organomegaly Central nervous system: Alert and oriented. No focal neurological  deficits. Extremities: No cyanosis, clubbing or edema Skin: No rashes or ulcers Psychiatry:  Mood & affect appropriate.     Data Reviewed: I have personally reviewed following labs and imaging studies  CBC:  Recent Labs Lab 07/17/16 2219 07/19/16 0326 07/20/16 0351  WBC 32.1* 23.1* 18.5*  NEUTROABS 28.6*  --   --   HGB 12.1 9.8* 9.0*  HCT 37.8 31.4* 28.8*  MCV 87.9 89.2 89.2  PLT 288 250 284   Basic Metabolic Panel:  Recent Labs Lab 07/17/16 2219 07/18/16 0249 07/18/16 0904 07/19/16 0326  NA 130* 131* 132* 135  K 4.0 3.9 4.5 4.0  CL 84* 92* 96* 97*  CO2 35* 30 28 27   GLUCOSE 91 120* 111* 101*  BUN 17 16 15 14   CREATININE 1.18* 0.91 1.04* 0.71  CALCIUM 8.6* 7.4* 7.2* 7.8*   GFR: Estimated Creatinine Clearance: 67.4 mL/min (by C-G formula based on SCr of 0.71 mg/dL). Liver Function Tests:  Recent Labs Lab 07/17/16 2219  AST 20  ALT 14  ALKPHOS 54  BILITOT 1.3*  PROT 7.4  ALBUMIN 3.8   No results for input(s): LIPASE, AMYLASE in the last 168 hours. No results for input(s): AMMONIA in the last 168 hours. Coagulation Profile:  Recent Labs Lab 07/18/16 0249 07/19/16 0326 07/20/16 0351  INR 1.51 1.34 1.14   Cardiac Enzymes: No results for input(s): CKTOTAL, CKMB, CKMBINDEX, TROPONINI in the last 168 hours. BNP (last 3 results) No results for input(s): PROBNP in the last 8760 hours. HbA1C: No results for input(s): HGBA1C in the last 72 hours. CBG: No results for input(s): GLUCAP in the last 168 hours. Lipid Profile: No results for input(s): CHOL, HDL, LDLCALC, TRIG, CHOLHDL, LDLDIRECT in the last 72 hours. Thyroid Function Tests:  Recent Labs  07/18/16 0604  TSH 0.528   Anemia Panel: No results for input(s): VITAMINB12, FOLATE, FERRITIN, TIBC, IRON, RETICCTPCT in the last 72 hours. Urine analysis:    Component Value Date/Time   COLORURINE AMBER (A) 07/18/2016 0810   APPEARANCEUR CLOUDY (A) 07/18/2016 0810   LABSPEC 1.017 07/18/2016 0810    PHURINE 6.0 07/18/2016 0810   GLUCOSEU NEGATIVE 07/18/2016 0810   HGBUR NEGATIVE 07/18/2016 0810   BILIRUBINUR SMALL (A) 07/18/2016 0810   KETONESUR NEGATIVE 07/18/2016 0810   PROTEINUR 30 (A) 07/18/2016 0810   UROBILINOGEN 0.2 09/19/2010 1112   NITRITE NEGATIVE 07/18/2016 0810   LEUKOCYTESUR SMALL (A) 07/18/2016 0810   Sepsis Labs: @LABRCNTIP (procalcitonin:4,lacticidven:4) ) Recent Results (from the past 240 hour(s))  Blood Culture (routine x 2)     Status: None (Preliminary result)   Collection Time: 07/17/16 10:18 PM  Result Value Ref Range Status   Specimen Description BLOOD LEFT ANTECUBITAL  Final   Special Requests BOTTLES DRAWN AEROBIC AND ANAEROBIC 5CC  Final   Culture   Final    NO GROWTH < 24 HOURS Performed at [facility]    Report Status PENDING  Incomplete  Blood Culture (routine x 2)     Status: None (Preliminary result)   Collection Time: 07/17/16 10:20 PM  Result Value Ref Range Status   Specimen Description BLOOD RIGHT ANTECUBITAL  Final   Special Requests BOTTLES DRAWN AEROBIC AND ANAEROBIC 5CC  Final   Culture   Final    NO GROWTH < 24 HOURS Performed at [facility]    Report Status PENDING  Incomplete  MRSA PCR Screening     Status: Abnormal   Collection Time: 07/18/16  2:11 AM  Result Value Ref Range Status   MRSA by PCR POSITIVE (A) NEGATIVE Final    Comment:        The GeneXpert MRSA Assay (FDA approved for NASAL specimens only), is one component of a comprehensive MRSA colonization surveillance program. It is not intended to diagnose MRSA infection nor to guide or monitor treatment for MRSA infections. RESULT CALLED TO, READ BACK BY AND VERIFIED WITH: J MCNULTY  RN 0503 07/18/16 A NAVARRO   Respiratory Panel by PCR     Status: None   Collection Time: 07/18/16  2:11 AM  Result Value Ref Range Status   Adenovirus NOT DETECTED NOT DETECTED Final   Coronavirus 229E NOT DETECTED NOT DETECTED Final   Coronavirus HKU1 NOT  DETECTED NOT DETECTED Final   Coronavirus NL63 NOT DETECTED NOT DETECTED Final   Coronavirus OC43 NOT DETECTED NOT DETECTED Final   Metapneumovirus NOT DETECTED NOT DETECTED Final   Rhinovirus / Enterovirus NOT DETECTED NOT DETECTED Final   Influenza A NOT DETECTED NOT DETECTED Final   Influenza B NOT DETECTED NOT DETECTED Final   Parainfluenza Virus 1 NOT DETECTED NOT DETECTED Final   Parainfluenza Virus 2 NOT DETECTED NOT DETECTED Final   Parainfluenza Virus 3 NOT DETECTED NOT DETECTED Final   Parainfluenza Virus 4 NOT DETECTED NOT DETECTED Final   Respiratory Syncytial Virus NOT DETECTED NOT DETECTED Final   Bordetella pertussis NOT DETECTED NOT DETECTED Final   Chlamydophila pneumoniae NOT DETECTED NOT DETECTED Final   Mycoplasma pneumoniae NOT DETECTED NOT DETECTED Final    Comment: Performed at [facility]  Urine culture     Status: Abnormal   Collection Time: 07/18/16  8:10 AM  Result Value Ref Range Status   Specimen Description URINE, CLEAN CATCH  Final   Special Requests NONE  Final   Culture MULTIPLE SPECIES PRESENT, SUGGEST RECOLLECTION (A)  Final   Report Status 07/19/2016 FINAL  Final         Radiology Studies: No results found.    Scheduled Meds: . ALPRAZolam  1 mg Oral QHS  . aztreonam  2 g Intravenous Q8H  . calcium citrate  950 mg Oral Daily  . Chlorhexidine Gluconate Cloth  6 each Topical Q0600  . enoxaparin (LOVENOX) injection  40 mg Subcutaneous Q24H  . fluticasone  1 spray Each Nare Daily  . ipratropium  0.5 mg Nebulization TID  . levalbuterol  0.63 mg Nebulization TID  . levofloxacin  750 mg Oral Daily  . methylPREDNISolone (SOLU-MEDROL) injection  60 mg Intravenous Q12H  . mupirocin ointment  1 application Nasal BID  . pantoprazole  40 mg Oral Daily  . traZODone  50 mg Oral QHS  . vancomycin  500 mg Intravenous BID   Continuous Infusions: . sodium chloride 100 mL/hr at 07/20/16 0543     LOS: 2 days    Time spent in minutes:  35    Deamonte Sayegh, MD [facility] Pager: [email or web address] Password [email or web address] 07/20/2016, 2:58 PM

## 2016-07-20 NOTE — Discharge Instructions (Signed)

## 2016-07-20 NOTE — Progress Notes (Signed)
OT Cancellation Note  Patient Details Name: Wendy [name] MRN: [id number] DOB: [birth year]   Cancelled Treatment:    Reason Eval/Treat Not Completed: Medical issues which prohibited therapy.  Pt with respiratory distress and plan for transfer to SDU.  Will try back next week.  Jimeka Balan [place], OTR/L [phone]   [name] 07/20/2016, 11:30 AM

## 2016-07-20 NOTE — Progress Notes (Signed)
Pt is fighting bipap, pulling mask off and refusing to put it back on at this time.  Pt placed on 2lnc, HR100, rr17, spo2 96%.  RT will continue to monitor and assess pt.  RN aware.

## 2016-07-20 NOTE — Consult Note (Signed)
Consultation Note Date: 07/20/2016   Patient Name: Wendy [name]  DOB: 03-27-1961  MRN: [id number]  Age / Sex: 55 y.o., female  PCP: Dessa Phi, MD Referring Physician: Calvert Cantor, MD  Reason for Consultation: Establishing goals of care  HPI/Patient Profile: 55 y.o. female    admitted on 07/17/2016     Clinical Assessment and Goals of Care:  Wendy [name] is a 55 y.o. female with medical history significant of COPD under hospice care, GERD, depression, anxiety, former alcoholism, psoriasis, dCHF, former smoker, who presents with fever, chills, shortness of breath, generalized weakness and right upper chest pain for past few days.    She had a recent admission from 9/18- 9/20 for traumatic T 12 burst fracture.   Patient has been diagnosed with a PNA in this hospitalization. She is on antibiotics. She has been followed by hospice in this hospitalization. For her COPD exacerbation, she has been continued on her O2, she is also on PO Morphine and Po Prednisone.   Patient has been enrolled in [facility] since May 2017 for her advanced COPD. Her code status in this hospitalization has been revised to DNR DNI. She has been reported to have developed more confusion and hence ABG was checked this am pH7.29, pCO2 of 63.9, 96%, 29.7 bicarb. Based on family preferences, the patient has been transferred to step down, started on BiPAP and palliative care has been consulted by primary service as well as [facility] to assist in clarification of additional goals of care.   The patient is now in step down,she is resting in bed, she appears older than her stated age, she is confused, she has mild dyspnea evident. She is able to open her eyes and state her name and age. Daughters arrived at the bedside shortly there after, see discussions and recommendations below. Thank you for the consult, palliative will continue to follow along  and help guide decision making.    NEXT OF KIN  2 daughters [name] and Archie Patten are her primary care givers. She has one son who has Down's syndrome, she has one other son.   SUMMARY OF RECOMMENDATIONS    DNR DNI: After extensive discussions, family is clear that aggressive interventions such as CPR, intubation and mechanical ventilation are not what this patient would want. They are happy with the care that hospice is providing at home. They would want BiPAP in an attempt to see if this will improve the patient's confusion and reduce her hyper [place]. Family agreeable to BiPAP and repeat ABG.   Careful hand assisted feeding once the patient is awake/ alert. Discussed in detail with family about how a PEG tube would not provide this patient with any comfort.   Explained hospice philosophy in detail, explained aggressive mechanical interventions in detail. Over all, goals are to ensure comfort, but are not limited to comfort only. Attempts to treat potentially reversible conditions such as PNA and hyper carbia are deemed appropriate. Discussed extensively with family that the patient is at high risk for sudden or  ongoing decline and de compensation given her advanced COPD.   Home medication list reviewed: would request Hospice to streamline opioids at home after discharge. It appears that at one point or another, the patient was on methadone po+Oxy IR PO+morphine solution PO.    Code Status/Advance Care Planning:  DNR    Symptom Management:    as above   Palliative Prophylaxis:   Bowel Regimen  Additional Recommendations (Limitations, Scope, Preferences):  DNR DNI, but not comfort care  Psycho-social/Spiritual:   Desire for further Chaplaincy support:yes  Additional Recommendations: Education on Hospice  Prognosis:   < 6 months  Discharge Planning: Home with Hospice      Primary Diagnoses: Present on Admission: . Chronic diastolic heart failure (HCC) . Chronic  respiratory failure with hypoxia (HCC) . GERD (gastroesophageal reflux disease) . HCAP (healthcare-associated pneumonia) . Sepsis (HCC) . Acute on chronic respiratory failure with hypoxia (HCC) . COPD exacerbation (HCC) . Hyponatremia   I have reviewed the medical record, interviewed the patient and family, and examined the patient. The following aspects are pertinent.  Past Medical History:  Diagnosis Date  . Alcoholism (HCC)   . Asthma   . COPD (chronic obstructive pulmonary disease) (HCC)   . Depression   . Oxygen dependent    home oxygen 3L/min  . Poor dentition   . Psoriasis   . Seasonal allergies   . Urinary, incontinence, stress female    Social History   Social History  . Marital status: Widowed    Spouse name: N/A  . Number of children: 4  . Years of education: N/A   Occupational History  . disabled     was a Production designer, theatre/television/film at [employer]   Social History Main Topics  . Smoking status: Former Smoker    Packs/day: 1.00    Years: 40.00    Types: Cigarettes    Start date: 11/10/1970    Quit date: 09/17/2013  . Smokeless tobacco: Never Used  . Alcohol use No     Comment: 5 cans of beer daily  " 06/2014 drinks occasional ".  3s/2/16 No longer drink  . Drug use: No  . Sexual activity: Yes    Partners: Male    Birth control/ protection: None   Other Topics Concern  . None   Social History Narrative  . None   Family History  Problem Relation Age of Onset  . Lung cancer Father   . COPD Father    Scheduled Meds: . ALPRAZolam  1 mg Oral QHS  . aztreonam  2 g Intravenous Q8H  . calcium citrate  950 mg Oral Daily  . Chlorhexidine Gluconate Cloth  6 each Topical Q0600  . enoxaparin (LOVENOX) injection  40 mg Subcutaneous Q24H  . fluticasone  1 spray Each Nare Daily  . ipratropium  0.5 mg Nebulization TID  . levalbuterol  0.63 mg Nebulization TID  . levofloxacin  750 mg Oral Daily  . methylPREDNISolone (SOLU-MEDROL) injection  60 mg Intravenous Q12H  . mupirocin  ointment  1 application Nasal BID  . pantoprazole  40 mg Oral Daily  . traZODone  50 mg Oral QHS  . vancomycin  500 mg Intravenous BID   Continuous Infusions: . sodium chloride 100 mL/hr at 07/20/16 0543   PRN Meds:.acetaminophen, acetaminophen, ALPRAZolam, morphine CONCENTRATE Medications Prior to Admission:  Prior to Admission medications   Medication Sig Start Date End Date Taking? Authorizing Provider  ALPRAZolam Prudy Feeler) 1 MG tablet 1 tab, three times daily and PRN at  bedtime Patient taking differently: Take 1 mg by mouth 3 (three) times daily. PRN at bedtime for sleep 05/24/16  Yes Waymon Budge, MD  [employer] SENNA PLUS 8.6-50 MG tablet Take 2 tablets by mouth 2 (two) times daily. 06/12/16  Yes Historical Provider, MD  fluticasone (FLONASE) 50 MCG/ACT nasal spray PLACE 2 SPRAYS INTO BOTH NOSTRILS DAILY. 10/28/15  Yes Waymon Budge, MD  Fluticasone-Salmeterol (ADVAIR DISKUS) 500-50 MCG/DOSE AEPB INHALE 1 PUFF INTO THE LUNGS 2 TIMES DAILY 07/03/16  Yes Waymon Budge, MD  ibuprofen (ADVIL,MOTRIN) 200 MG tablet Take 200 mg by mouth every 6 (six) hours as needed for fever, headache, mild pain, moderate pain or cramping.   Yes Historical Provider, MD  levalbuterol [facility] HFA) 45 MCG/ACT inhaler Inhale 2 puffs into the lungs every 4 (four) hours as needed for wheezing or shortness of breath. 03/02/16  Yes Dessa Phi, MD  levalbuterol (XOPENEX) 0.63 MG/3ML nebulizer solution Take 74ml every 4 hours and as needed Patient taking differently: Take 0.63 mg by nebulization every 4 (four) hours as needed for wheezing or shortness of breath.  05/08/16  Yes Waymon Budge, MD  Morphine Sulfate (MORPHINE CONCENTRATE) 10 mg / 0.5 ml concentrated solution Take 0.13 mLs (2.6 mg total) by mouth every 6 (six) hours as needed for moderate pain. 2.5mg  po q6h prn for sob Patient taking differently: Take 2.5 mg by mouth every 4 (four) hours as needed for moderate pain.  06/06/16  Yes Costin Otelia Sergeant, MD    pantoprazole (PROTONIX) 20 MG tablet TAKE 1 TABLET (20 MG TOTAL) BY MOUTH DAILY BEFORE SUPPER. 02/28/16  Yes Clinton D Young, MD  predniSONE (DELTASONE) 10 MG tablet TAKE 2 TABLETS (20 MG TOTAL) BY MOUTH DAILY WITH BREAKFAST. 06/11/16  Yes Josalyn Funches, MD  SPIRIVA HANDIHALER 18 MCG inhalation capsule PLACE 1 CAPSULE (18 MCG TOTAL) INTO INHALER AND INHALE DAILY. 03/22/16  Yes Waymon Budge, MD  chlorpheniramine-HYDROcodone (TUSSIONEX) 10-8 MG/5ML SUER Take 5 mLs by mouth every 12 (twelve) hours as needed for cough. 07/20/16   Calvert Cantor, MD  levofloxacin (LEVAQUIN) 750 MG tablet Take 1 tablet (750 mg total) by mouth daily. 07/20/16   Calvert Cantor, MD  methadone (DOLOPHINE) 5 MG tablet Take 5 mg by mouth 2 (two) times daily. 07/03/16   Historical Provider, MD  oxyCODONE (OXY IR/ROXICODONE) 5 MG immediate release tablet Take 5 mg by mouth every 4 (four) hours as needed for breakthrough pain. 07/05/16   Historical Provider, MD  predniSONE (DELTASONE) 10 MG tablet Take 2 tablets (20 mg total) by mouth daily with breakfast. Patient not taking: Reported on 07/18/2016 04/25/16   Waymon Budge, MD  predniSONE (DELTASONE) 20 MG tablet Take 3 tablets (60 mg total) by mouth daily with breakfast. 60 mg tomorrow 40 mg x 2 days 30 mg x 2 days 20 mg daily and continue 07/20/16   Calvert Cantor, MD  sulfamethoxazole-trimethoprim (BACTRIM) 400-80 MG tablet Take 1 tablet by mouth 2 (two) times daily. 07/20/16   Calvert Cantor, MD   Allergies  Allergen Reactions  . Augmentin [Amoxicillin-Pot Clavulanate] Nausea Only    Has patient had a PCN reaction causing immediate rash, facial/tongue/throat swelling, SOB or lightheadedness with hypotension: No Has patient had a PCN reaction causing severe rash involving mucus membranes or skin necrosis: No Has patient had a PCN reaction that required hospitalization No Has patient had a PCN reaction occurring within the last 10 years: Yes If all of the above answers are "NO", then  may proceed with Cephalosporin use.   . Cefdinir Other (See Comments)    Aches, "heart fluttering"  . Doxycycline Nausea And Vomiting  . Other Nausea Only    PAIN MEDICATIONS-nausea  . Codeine Nausea Only  . Fexofenadine Nausea Only  . Hydralazine     Side effect Felt woozy   Review of Systems + for confusion + for dyspnea + for mild anxiety  Physical Exam Somnolent appearing lady, looks older than stated age 39 eyes is able to state name/ age but is not able to carry on conversations S1 S2 Few scattered wheezes, diminished bases Abdomen soft No edema  Vital Signs: BP (!) 163/79 (BP Location: Right Arm)   Pulse (!) 105   Temp 97.7 F (36.5 C) (Axillary)   Resp 15   Ht 5\' 1"  (1.549 m)   Wt 65.9 kg (145 lb 4.5 oz)   SpO2 95%   BMI 27.45 kg/m  Pain Assessment: No/denies pain POSS *See Group Information*: S-Acceptable,Sleep, easy to arouse Pain Score: 0-No pain   SpO2: SpO2: 95 % O2 Device:SpO2: 95 % O2 Flow Rate: .O2 Flow Rate (L/min): 3 L/min  IO: Intake/output summary:  Intake/Output Summary (Last 24 hours) at 07/20/16 1306 Last data filed at 07/20/16 1200  Gross per 24 hour  Intake             2920 ml  Output             1800 ml  Net             1120 ml    LBM: Last BM Date: 07/16/16 Baseline Weight: Weight: 62.5 kg (137 lb 12.6 oz) Most recent weight: Weight: 65.9 kg (145 lb 4.5 oz)     Palliative Assessment/Data:   Flowsheet Rows   Flowsheet Row Most Recent Value  Intake Tab  Referral Department  Hospitalist  Unit at Time of Referral  ICU  Palliative Care Primary Diagnosis  Pulmonary  Palliative Care Type  Return patient Palliative Care  Reason for referral  Non-pain Symptom, Clarify Goals of Care, Psychosocial or Spiritual support, Counsel Regarding Hospice  Date first seen by Palliative Care  07/20/16  Clinical Assessment  Palliative Performance Scale Score  30%  Pain Max last 24 hours  4  Pain Min Last 24 hours  3  Dyspnea Max Last 24  Hours  8  Dyspnea Min Last 24 hours  6  Nausea Max Last 24 Hours  3  Nausea Min Last 24 Hours  2  Anxiety Max Last 24 Hours  6  Anxiety Min Last 24 Hours  5  Psychosocial & Spiritual Assessment  Palliative Care Outcomes  Patient/Family meeting held?  Yes  Who was at the meeting?  patient, 2 daughters 13/03/17 and [place].   Palliative Care Outcomes  Clarified goals of care  Palliative Care follow-up planned  Yes, Home      Time In:  11 am  Time Out:  12.10 Time Total:  70 Greater than 50%  of this time was spent counseling and coordinating care related to the above assessment and plan.  Signed by: [place], MD   Please contact Palliative Medicine Team phone at [phone] for questions and concerns.  For individual provider: See [phone]

## 2016-07-20 NOTE — Progress Notes (Signed)
PT Cancellation Note  Patient Details Name: Wendy [name] MRN: [id number] DOB: 1960-11-14   Cancelled Treatment:     pt was transferred to SDU 2nd Respiratory Distress.     Felecia Shelling  PTA [facility]  Acute  Rehab Pager      [phone]

## 2016-07-20 NOTE — Discharge Summary (Signed)
Physician Discharge Summary  Loveda Colaizzi [facility] ZDG:[id number] DOB: 11-27-1960 DOA: 07/17/2016  PCP: Lora Paula, MD  Admit date: 07/17/2016 Discharge date: 07/23/2016  Admitted From: home  Disposition:  [facility]   Morphine may be causing some of her confusion   Discharge Condition:  fair   CODE STATUS:  DNR   Diet recommendation:  Regular diet as tolerated Consultations:  Palliative care    Discharge Diagnoses:  Principal Problem:   Acute on chronic respiratory failure with hypoxia (HCC) Active Problems:   Sepsis (HCC)   HCAP (healthcare-associated pneumonia)   AKI (acute kidney injury) (HCC)   Hyponatremia   Chronic diastolic heart failure (HCC)   Chronic respiratory failure with hypoxia (HCC)   DNR (do not resuscitate) discussion   COPD exacerbation (HCC)   GERD (gastroesophageal reflux disease)   Acute delirium    Subjective: Confused and sleepy this morning.   Brief Summary: Wendy [name] a 55 y.o.femalewith medical history significant of COPD under hospice care, GERD, depression, anxiety, former alcoholism, psoriasis, dCHF, former smoker, who presents with fever, chills, shortness of breath, generalized weakness and right upper chest pain for past few days.   She had a recent admission from 9/18- 9/20 for traumatic T 12 burst fracture.  Her family has decided today to transition her to comfort care and transfer her to a hospice home.  Medications for comfort to be prescribed at facility.     Hospital Course:  Principal Problem:   Acute on chronic respiratory failure with hypoxia/ severe sepsis -11/4-  Family states that patient has a living will stating that she would like to be intubated but does not want to be maintained on life support indefinitely- changed code status to Full Code- alerted PCCM.  - once the patient was alert and oriented again, she decided to be a DNR  (A)  HCAP (healthcare-associated pneumonia) - RUL  with symptoms of dyspnea and fever (102)- tachycardic- Lactic acid 2.16- has improved- procalcitonin 1.96 - currently on Vanc, Azactam and Levaquin due to PCN allergy but in the past she has been able to tolerate Zosyn - due to increasing confusion, have stopped Levaquin (she has had 5 days) and switched to Zosyn  - MRSA PCR + so continue Vanc - SLP eval to assess whether she has issues with silent aspiration was cancelled by SLP - influenza and resp viral panel negative  -  blood cultures NGTD - CXR repeated to essentially look for pulm edema as she was more hypoxic- this shows progression of infiltrate but she is clinically improving in regards to the pneumonia- no further fevers and leukocytosis improving  (B) COPD exacerbation - on 3 L of O 2 at baseline which has not changed - has been continued on Nebs & steriods - on hospice for severe COPD and on 20 mg of Prednisone daily at baseline - oral Morphine PRN - 11/3>> ABG showing pH of 7.29 and PCO2 of 63 (which is her baseline)- placed on BiPAP and transfered to SDU - palliative care consulted to again address GOC with patient and family   (C) OSA - has told me and tech that she does not always wear her BiPAP at home - BiPAP setting per prior pulm note was 12/8  Active Problems:  Acute encephalopathy? Seeing bugs -  she is able to converse and remembers me each day. RN noted "confusion" last night which improved after BiPAP but no mention of seeing bugs. She did not tell  me about her visual hallucinations either but apparently only told her daughters - per daughters, she takes Xanax 1mg  QID at home (perscription states TID PRN and QHS)- she has been always asleep (but alert and oriented when aroused) when I enter the room and due to risk for hypercapnia, it was not resumed in the daytime - resumed Xanax on daughters' request- patient remains alert and still confused - changed Levaquin to Zosyn as mentioned above - confusion and  hallucinations resolved yesterday but recurred today -  ? If Morphine is causing some of this   Hypertensive Urgency - have been giving Clonidine PRN- likely is due to NS, steroids and stress -  placed Nitro patch  Hyponatremia/ mild AKI - due to dehydration-improved with NS at 100 cc/hr   SVT  - occurred 11/1 after she got back to bed from the chair - resolved after Adenosine- ECHO done in March and therefore not repeated - SVT recurred on 11/7- givan PRN Lopressor to control rate  Traumatic T12 fracture on 9/18 - no back pain  Thrush - Nystatin   Elevated PT/ INR - repeated the following day- mildly elevated still at 16.7/ 1.34- improved to normal after 5 mg oral Vit K    Chronic diastolic heart failure - grade 1 diastolic dysfunction on 3/17    GERD (gastroesophageal reflux disease) - has been on Protonix  Lung nodules  - 2 nodules noted on CT on 9/17    Discharge Instructions  Discharge Instructions    Discharge instructions    Complete by:  As directed    Regular diet You must wear your CPAP every night   Discharge instructions    Complete by:  As directed    Regular diet as tolerated   Increase activity slowly    Complete by:  As directed        Medication List    STOP taking these medications   ALPRAZolam 1 MG tablet Commonly known as:  XANAX   chlorpheniramine-HYDROcodone 10-8 MG/5ML Suer Commonly known as:  TUSSIONEX   methadone 5 MG tablet Commonly known as:  DOLOPHINE   oxyCODONE 5 MG immediate release tablet Commonly known as:  Oxy IR/ROXICODONE   pantoprazole 20 MG tablet Commonly known as:  PROTONIX   predniSONE 10 MG tablet Commonly known as:  DELTASONE     TAKE these medications   [employer] SENNA PLUS 8.6-50 MG tablet Generic drug:  senna-docusate Take 2 tablets by mouth 2 (two) times daily.   fluticasone 50 MCG/ACT nasal spray Commonly known as:  FLONASE PLACE 2 SPRAYS INTO BOTH NOSTRILS DAILY.    Fluticasone-Salmeterol 500-50 MCG/DOSE Aepb Commonly known as:  ADVAIR DISKUS INHALE 1 PUFF INTO THE LUNGS 2 TIMES DAILY   ibuprofen 200 MG tablet Commonly known as:  ADVIL,MOTRIN Take 200 mg by mouth every 6 (six) hours as needed for fever, headache, mild pain, moderate pain or cramping.   levalbuterol 0.63 MG/3ML nebulizer solution Commonly known as:  XOPENEX Take 3ml every 4 hours and as needed What changed:  how much to take  how to take this  when to take this  reasons to take this  additional instructions   levalbuterol 45 MCG/ACT inhaler Commonly known as:  XOPENEX HFA Inhale 2 puffs into the lungs every 4 (four) hours as needed for wheezing or shortness of breath.   morphine CONCENTRATE 10 mg / 0.5 ml concentrated solution Take 0.13 mLs (2.6 mg total) by mouth every 6 (six) hours as needed for  moderate pain. 2.5mg  po q6h prn for sob What changed:  how much to take  when to take this  additional instructions   SPIRIVA HANDIHALER 18 MCG inhalation capsule Generic drug:  tiotropium PLACE 1 CAPSULE (18 MCG TOTAL) INTO INHALER AND INHALE DAILY.   sulfamethoxazole-trimethoprim 400-80 MG tablet Commonly known as:  BACTRIM Take 1 tablet by mouth 2 (two) times daily.      Follow-up Information    Lora Paula, MD. Schedule an appointment as soon as possible for a visit in 1 week(s).   Specialty:  Family Medicine Why:  see her or one of her collegues in 1 wk Contact information: [address] [place] [address] [phone]          Allergies  Allergen Reactions  . Augmentin [Amoxicillin-Pot Clavulanate] Nausea Only    Has patient had a PCN reaction causing immediate rash, facial/tongue/throat swelling, SOB or lightheadedness with hypotension: No Has patient had a PCN reaction causing severe rash involving mucus membranes or skin necrosis: No Has patient had a PCN reaction that required hospitalization No Has patient had a PCN reaction  occurring within the last 10 years: Yes If all of the above answers are "NO", then may proceed with Cephalosporin use.   . Cefdinir Other (See Comments)    Aches, "heart fluttering"  . Doxycycline Nausea And Vomiting  . Other Nausea Only    PAIN MEDICATIONS-nausea  . Codeine Nausea Only  . Fexofenadine Nausea Only  . Hydralazine     Side effect Felt woozy     Procedures/Studies:   Dg Chest 2 View  Result Date: 07/17/2016 CLINICAL DATA:  Acute onset of shortness of breath and fatigue. Initial encounter. EXAM: CHEST  2 VIEW COMPARISON:  Chest radiograph and CT of the chest performed 06/04/2016 FINDINGS: New right upper lobe airspace opacity is suspicious for pneumonia. Underlying interstitial prominence is noted. No pleural effusion or pneumothorax is seen. The known left-sided pulmonary nodules are not well characterized on radiograph. The heart remains normal in size. No acute osseous abnormalities are identified. IMPRESSION: New right upper lobe airspace opacity is suspicious for pneumonia. Electronically Signed   By: Roanna Raider M.D.   On: 07/17/2016 22:52   Dg Chest Port 1 View  Result Date: 07/21/2016 CLINICAL DATA:  Hypoxia.  History of asthma and COPD. EXAM: PORTABLE CHEST 1 VIEW COMPARISON:  07/17/2016 and 06/04/2016 FINDINGS: Right upper lobe consolidation has increased when compared to the prior exam, new since 06/04/2016. Remainder of the lungs is clear. Lungs are hyperexpanded. No convincing pleural effusion.  No pneumothorax. Cardiac silhouette is normal in size. No mediastinal or hilar masses are evident. IMPRESSION: 1. Increased airspace consolidation noted in the right upper lobe since the most recent prior exam. This is consistent with worsened pneumonia. 2. No other acute abnormalities. 3. COPD. Electronically Signed   By: Amie Portland M.D.   On: 07/21/2016 10:53       Discharge Exam: Vitals:   07/23/16 0834 07/23/16 0900  BP:  (!) 152/89  Pulse: (!) 101 84   Resp: 20 18  Temp:     Vitals:   07/23/16 0800 07/23/16 0834 07/23/16 0900 07/23/16 1000  BP:   (!) 152/89   Pulse: 85 (!) 101 84   Resp: 17 20 18    Temp:      TempSrc:      SpO2: 94% 94% 94%   Weight:    64.9 kg (143 lb 1.3 oz)  Height:  General: Pt is alert, awake, not in acute distress Cardiovascular: RRR, S1/S2 +, no rubs, no gallops Respiratory: CTA bilaterally, no wheezing, no rhonchi Abdominal: Soft, NT, ND, bowel sounds + Extremities: no edema, no cyanosis    The results of significant diagnostics from this hospitalization (including imaging, microbiology, ancillary and laboratory) are listed below for reference.     Microbiology: Recent Results (from the past 240 hour(s))  Blood Culture (routine x 2)     Status: None   Collection Time: 07/17/16 10:18 PM  Result Value Ref Range Status   Specimen Description BLOOD LEFT ANTECUBITAL  Final   Special Requests BOTTLES DRAWN AEROBIC AND ANAEROBIC 5CC  Final   Culture   Final    NO GROWTH 5 DAYS Performed at [facility]    Report Status 07/23/2016 FINAL  Final  Blood Culture (routine x 2)     Status: None   Collection Time: 07/17/16 10:20 PM  Result Value Ref Range Status   Specimen Description BLOOD RIGHT ANTECUBITAL  Final   Special Requests BOTTLES DRAWN AEROBIC AND ANAEROBIC 5CC  Final   Culture   Final    NO GROWTH 5 DAYS Performed at [facility]    Report Status 07/23/2016 FINAL  Final  MRSA PCR Screening     Status: Abnormal   Collection Time: 07/18/16  2:11 AM  Result Value Ref Range Status   MRSA by PCR POSITIVE (A) NEGATIVE Final    Comment:        The GeneXpert MRSA Assay (FDA approved for NASAL specimens only), is one component of a comprehensive MRSA colonization surveillance program. It is not intended to diagnose MRSA infection nor to guide or monitor treatment for MRSA infections. RESULT CALLED TO, READ BACK BY AND VERIFIED WITH: J MCNULTY RN [id number] 07/18/16 A  NAVARRO   Respiratory Panel by PCR     Status: None   Collection Time: 07/18/16  2:11 AM  Result Value Ref Range Status   Adenovirus NOT DETECTED NOT DETECTED Final   Coronavirus 229E NOT DETECTED NOT DETECTED Final   Coronavirus HKU1 NOT DETECTED NOT DETECTED Final   Coronavirus NL63 NOT DETECTED NOT DETECTED Final   Coronavirus OC43 NOT DETECTED NOT DETECTED Final   Metapneumovirus NOT DETECTED NOT DETECTED Final   Rhinovirus / Enterovirus NOT DETECTED NOT DETECTED Final   Influenza A NOT DETECTED NOT DETECTED Final   Influenza B NOT DETECTED NOT DETECTED Final   Parainfluenza Virus 1 NOT DETECTED NOT DETECTED Final   Parainfluenza Virus 2 NOT DETECTED NOT DETECTED Final   Parainfluenza Virus 3 NOT DETECTED NOT DETECTED Final   Parainfluenza Virus 4 NOT DETECTED NOT DETECTED Final   Respiratory Syncytial Virus NOT DETECTED NOT DETECTED Final   Bordetella pertussis NOT DETECTED NOT DETECTED Final   Chlamydophila pneumoniae NOT DETECTED NOT DETECTED Final   Mycoplasma pneumoniae NOT DETECTED NOT DETECTED Final    Comment: Performed at [facility]  Urine culture     Status: Abnormal   Collection Time: 07/18/16  8:10 AM  Result Value Ref Range Status   Specimen Description URINE, CLEAN CATCH  Final   Special Requests NONE  Final   Culture MULTIPLE SPECIES PRESENT, SUGGEST RECOLLECTION (A)  Final   Report Status 07/19/2016 FINAL  Final     Labs: BNP (last 3 results)  Recent Labs  12/04/15 2020 03/05/16 1544 07/18/16 0103  BNP 48.8 67.0 80.6   Basic Metabolic Panel:  Recent Labs Lab 07/18/16 0904 07/19/16  [id number] 07/21/16 0316 07/22/16 0804 07/22/16 1653 07/23/16 0600  NA 132* 135 141 143  --  142  K 4.5 4.0 3.8 4.2  --  3.1*  CL 96* 97* 105 106  --  97*  CO2 28 27 30 28   --  39*  GLUCOSE 111* 101* 99 106*  --  126*  BUN 15 14 14  22*  --  22*  CREATININE 1.04* 0.71 0.57 0.58  --  0.50  CALCIUM 7.2* 7.8* 8.3* 8.2*  --  8.4*  MG  --   --   --   --  1.7  --     Liver Function Tests:  Recent Labs Lab 07/17/16 2219  AST 20  ALT 14  ALKPHOS 54  BILITOT 1.3*  PROT 7.4  ALBUMIN 3.8   No results for input(s): LIPASE, AMYLASE in the last 168 hours. No results for input(s): AMMONIA in the last 168 hours. CBC:  Recent Labs Lab 07/17/16 2219 07/19/16 0326 07/20/16 0351 07/21/16 0316 07/22/16 0804 07/23/16 0600  WBC 32.1* 23.1* 18.5* 15.7* 11.7* 10.7*  NEUTROABS 28.6*  --   --   --   --   --   HGB 12.1 9.8* 9.0* 9.2* 9.2* 9.6*  HCT 37.8 31.4* 28.8* 29.7* 29.9* 31.4*  MCV 87.9 89.2 89.2 90.5 90.1 89.0  PLT 288 250 284 277 370 370   Cardiac Enzymes:  Recent Labs Lab 07/22/16 1653 07/22/16 2252 07/23/16 0600  TROPONINI 0.03* <0.03 <0.03   BNP: Invalid input(s): POCBNP CBG: No results for input(s): GLUCAP in the last 168 hours. D-Dimer No results for input(s): DDIMER in the last 72 hours. Hgb A1c No results for input(s): HGBA1C in the last 72 hours. Lipid Profile No results for input(s): CHOL, HDL, LDLCALC, TRIG, CHOLHDL, LDLDIRECT in the last 72 hours. Thyroid function studies No results for input(s): TSH, T4TOTAL, T3FREE, THYROIDAB in the last 72 hours.  Invalid input(s): FREET3 Anemia work up  Recent Labs  07/21/16 1030  VITAMINB12 192   Urinalysis    Component Value Date/Time   COLORURINE YELLOW 07/22/2016 2134   APPEARANCEUR CLEAR 07/22/2016 2134   LABSPEC 1.021 07/22/2016 2134   PHURINE 6.5 07/22/2016 2134   GLUCOSEU NEGATIVE 07/22/2016 2134   HGBUR NEGATIVE 07/22/2016 2134   BILIRUBINUR NEGATIVE 07/22/2016 2134   KETONESUR NEGATIVE 07/22/2016 2134   PROTEINUR NEGATIVE 07/22/2016 2134   UROBILINOGEN 0.2 09/19/2010 1112   NITRITE NEGATIVE 07/22/2016 2134   LEUKOCYTESUR NEGATIVE 07/22/2016 2134   Sepsis Labs Invalid input(s): PROCALCITONIN,  WBC,  LACTICIDVEN Microbiology Recent Results (from the past 240 hour(s))  Blood Culture (routine x 2)     Status: None   Collection Time: 07/17/16 10:18 PM   Result Value Ref Range Status   Specimen Description BLOOD LEFT ANTECUBITAL  Final   Special Requests BOTTLES DRAWN AEROBIC AND ANAEROBIC 5CC  Final   Culture   Final    NO GROWTH 5 DAYS Performed at [facility]    Report Status 07/23/2016 FINAL  Final  Blood Culture (routine x 2)     Status: None   Collection Time: 07/17/16 10:20 PM  Result Value Ref Range Status   Specimen Description BLOOD RIGHT ANTECUBITAL  Final   Special Requests BOTTLES DRAWN AEROBIC AND ANAEROBIC 5CC  Final   Culture   Final    NO GROWTH 5 DAYS [facility]     Report Status 07/23/2016 FINAL  Final  MRSA PCR Screening     Status: Abnormal  Collection Time: 07/18/16  2:11 AM  Result Value Ref Range Status   MRSA by PCR POSITIVE (A) NEGATIVE Final    Comment:        The GeneXpert MRSA Assay (FDA approved for NASAL specimens only), is one component of a comprehensive MRSA colonization surveillance program. It is not intended to diagnose MRSA infection nor to guide or monitor treatment for MRSA infections. RESULT CALLED TO, READ BACK BY AND VERIFIED WITH: J MCNULTY RN [id number] 07/18/16 A NAVARRO   Respiratory Panel by PCR     Status: None   Collection Time: 07/18/16  2:11 AM  Result Value Ref Range Status   Adenovirus NOT DETECTED NOT DETECTED Final   Coronavirus 229E NOT DETECTED NOT DETECTED Final   Coronavirus HKU1 NOT DETECTED NOT DETECTED Final   Coronavirus NL63 NOT DETECTED NOT DETECTED Final   Coronavirus OC43 NOT DETECTED NOT DETECTED Final   Metapneumovirus NOT DETECTED NOT DETECTED Final   Rhinovirus / Enterovirus NOT DETECTED NOT DETECTED Final   Influenza A NOT DETECTED NOT DETECTED Final   Influenza B NOT DETECTED NOT DETECTED Final   Parainfluenza Virus 1 NOT DETECTED NOT DETECTED Final   Parainfluenza Virus 2 NOT DETECTED NOT DETECTED Final   Parainfluenza Virus 3 NOT DETECTED NOT DETECTED Final   Parainfluenza Virus 4 NOT DETECTED NOT DETECTED Final    Respiratory Syncytial Virus NOT DETECTED NOT DETECTED Final   Bordetella pertussis NOT DETECTED NOT DETECTED Final   Chlamydophila pneumoniae NOT DETECTED NOT DETECTED Final   Mycoplasma pneumoniae NOT DETECTED NOT DETECTED Final    Comment: Performed at [facility]  Urine culture     Status: Abnormal   Collection Time: 07/18/16  8:10 AM  Result Value Ref Range Status   Specimen Description URINE, CLEAN CATCH  Final   Special Requests NONE  Final   Culture MULTIPLE SPECIES PRESENT, SUGGEST RECOLLECTION (A)  Final   Report Status 07/19/2016 FINAL  Final     Time coordinating discharge: Over 30 minutes  SIGNED:   Calvert Cantor, MD  [facility] 07/23/2016, 2:24 PM Pager   If 7PM-7AM, please contact night-coverage [email or web address] Password [email or web address]

## 2016-07-21 ENCOUNTER — Inpatient Hospital Stay (HOSPITAL_COMMUNITY)

## 2016-07-21 DIAGNOSIS — I5032 Chronic diastolic (congestive) heart failure: Secondary | ICD-10-CM

## 2016-07-21 DIAGNOSIS — N179 Acute kidney failure, unspecified: Secondary | ICD-10-CM

## 2016-07-21 LAB — BASIC METABOLIC PANEL
ANION GAP: 6 (ref 5–15)
BUN: 14 mg/dL (ref 6–20)
CO2: 30 mmol/L (ref 22–32)
Calcium: 8.3 mg/dL — ABNORMAL LOW (ref 8.9–10.3)
Chloride: 105 mmol/L (ref 101–111)
Creatinine, Ser: 0.57 mg/dL (ref 0.44–1.00)
GFR calc Af Amer: >60 mL/min (ref >60)
GFR calc non Af Amer: >60 mL/min (ref >60)
GLUCOSE: 99 mg/dL (ref 65–99)
POTASSIUM: 3.8 mmol/L (ref 3.5–5.1)
Sodium: 141 mmol/L (ref 135–145)

## 2016-07-21 LAB — BLOOD GAS, ARTERIAL
Acid-Base Excess: 4.6 mmol/L — ABNORMAL HIGH (ref 0.0–2.0)
Bicarbonate: 30.4 mmol/L — ABNORMAL HIGH (ref 20.0–28.0)
DELIVERY SYSTEMS: POSITIVE
DRAWN BY: 308601
Expiratory PAP: 8
FIO2: 30
Inspiratory PAP: 12
MODE: POSITIVE
O2 Saturation: 92.8 %
Patient temperature: 98.6
pCO2 arterial: 56 mmHg — ABNORMAL HIGH (ref 32.0–48.0)
pH, Arterial: 7.353 (ref 7.350–7.450)
pO2, Arterial: 68.2 mmHg — ABNORMAL LOW (ref 83.0–108.0)

## 2016-07-21 LAB — CBC
HEMATOCRIT: 29.7 % — AB (ref 36.0–46.0)
HEMOGLOBIN: 9.2 g/dL — AB (ref 12.0–15.0)
MCH: 28 pg (ref 26.0–34.0)
MCHC: 31 g/dL (ref 30.0–36.0)
MCV: 90.5 fL (ref 78.0–100.0)
Platelets: 277 10*3/uL (ref 150–400)
RBC: 3.28 MIL/uL — ABNORMAL LOW (ref 3.87–5.11)
RDW: 13.6 % (ref 11.5–15.5)
WBC: 15.7 10*3/uL — ABNORMAL HIGH (ref 4.0–10.5)

## 2016-07-21 LAB — VITAMIN B12: VITAMIN B 12: 192 pg/mL (ref 180–914)

## 2016-07-21 MED ORDER — PIPERACILLIN-TAZOBACTAM 3.375 G IVPB
3.3750 g | Freq: Three times a day (TID) | INTRAVENOUS | Status: DC
  Administered 2016-07-21 – 2016-07-23 (×6): 3.375 g via INTRAVENOUS
  Filled 2016-07-21 (×6): qty 50

## 2016-07-21 MED ORDER — METHYLPREDNISOLONE SODIUM SUCC 125 MG IJ SOLR
60.0000 mg | INTRAMUSCULAR | Status: DC
  Administered 2016-07-21: 60 mg via INTRAVENOUS
  Filled 2016-07-21: qty 2

## 2016-07-21 MED ORDER — DOXYCYCLINE HYCLATE 100 MG IV SOLR
100.0000 mg | Freq: Two times a day (BID) | INTRAVENOUS | Status: DC
  Filled 2016-07-21: qty 100

## 2016-07-21 MED ORDER — SODIUM CHLORIDE 0.9 % IV SOLN
INTRAVENOUS | Status: DC
  Administered 2016-07-21: 1000 mL via INTRAVENOUS

## 2016-07-21 MED ORDER — CLONIDINE HCL 0.1 MG PO TABS
0.1000 mg | ORAL_TABLET | Freq: Four times a day (QID) | ORAL | Status: DC | PRN
  Administered 2016-07-21 – 2016-07-23 (×3): 0.1 mg via ORAL
  Filled 2016-07-21 (×3): qty 1

## 2016-07-21 NOTE — Progress Notes (Addendum)
PROGRESS NOTE    Wendy [name]  UEA:[id number] DOB: 06-26-1961 DOA: 07/17/2016  PCP: Lora Paula, MD   Brief Narrative:  Wendy [name] is a 55 y.o. female with medical history significant of COPD under hospice care, GERD, depression, anxiety, former alcoholism, psoriasis, dCHF, former smoker, who presents with fever, chills, shortness of breath, generalized weakness and right upper chest pain for past few days.   She had a recent admission from 9/18- 9/20 for traumatic T 12 burst fracture.   Subjective: No complaints today.    Assessment & Plan:   Principal Problem:   Acute on chronic respiratory failure with hypoxia/ severe sepsis - family now states that patient has a living will stating that she would like to be intubated but does not want to be maintained on life support indefinitely. Have changed code status to Full Code. Have alerted PCCM. No formal consult requested yet as patient does not seem to need imminent intubation.  (A)  HCAP (healthcare-associated pneumonia) - RUL with symptoms of dyspnea and fever (102)- tachycardic- Lactic acid 2.16- has improved- procalcitonin 1.96 - currently on Vanc, Azactam and Levaquin due to PCN allergy but in the past she has been able to tolerate Zosyn - due to increasing confusion, have stopped Levaquin (she has had 5 days) and switched to Zosyn  - MRSA PCR + so continue Vanc - SLP eval to assess whether she has issues with silent aspiration was cancelled by SLP - influenza and resp viral panel negative  -  blood cultures NGTD - CXR repeated today to essentially look for pulm edema as she was more hypoxic- this shows progression of infiltrate but she is clinically improving if regards to the pneumonia- no further fevers and leukocytosis improving  (B) COPD exacerbation - on 3 L of O 2 at baseline which has not changed -cont Nebs, steriods - on hospice for severe COPD and on 20 mg of Prednisone daily at baseline - oral  Morphine PRN - 11/3>> ABG showing pH of 7.29 and PCO2 of 63 (which is her baseline)- placed on BiPAP and transfered to SDU - palliative care consulted to again address GOC with patient and family   (C) OSA - has told me and tech that she does not always wear her BiPAP at home - BiPAP setting per prior pulm note was 12/8  Active Problems:  Acute encephalopathy? Seeing bugs -  she is able to converse and remembers me each day. RN noted "confusion" last night which improved after BiPAP but no mention of seeing bugs. She did not tell me about her visual hallucinations either but apparently only told her daughters - per daughters, she takes Xanax 1mg  QID at home (perscription states TID PRN and QHS)- she has been always asleep (but alert and oriented when aroused) when I enter the room and due to risk for hypercapnia, it was not resumed in the daytime - resumed Xanax on daughters' request- patient remains alert and still confused - changed Levaquin to Zosyn as mentioned above- check UA  Hyponatremia/ mild AKI - due to dehydration-improved with NS at 100 cc/hr- continue  SVT  - occurred 11/1 after she got back to bed from the chair - resolved after Adenosine- ECHO done in March and therefore not repeated - continue to follow on telemetry  Traumatic T12 fracture on 9/18 - no back pain  Thrush - Nystatin- follow  Elevated PT/ INR - recheckd  - mildly elevated still at 16.7/ 1.34- improved to  normal after 5 mg oral Vit K    Chronic diastolic heart failure - grade 1 diastolic dysfunction on 3/17    GERD (gastroesophageal reflux disease) - Protonix  Lung nodules  - 2 nodules noted on CT on 9/17   DVT prophylaxis: Lovenox Code Status: DNR Family Communication: daughters Disposition Plan: follow in SDU Consultants:   Palliative care Procedures:    Antimicrobials:  Anti-infectives    Start     Dose/Rate Route Frequency Ordered Stop   07/21/16 1200  doxycycline (VIBRAMYCIN)  100 mg in dextrose 5 % 250 mL IVPB  Status:  Discontinued     100 mg 125 mL/hr over 120 Minutes Intravenous Every 12 hours 07/21/16 1013 07/21/16 1128   07/21/16 1200  piperacillin-tazobactam (ZOSYN) IVPB 3.375 g     3.375 g 12.5 mL/hr over 240 Minutes Intravenous Every 8 hours 07/21/16 1134     07/20/16 0000  levofloxacin (LEVAQUIN) 750 MG tablet     750 mg Oral Daily 07/20/16 0837     07/20/16 0000  sulfamethoxazole-trimethoprim (BACTRIM) 400-80 MG tablet     1 tablet Oral 2 times daily 07/20/16 0838     07/19/16 2200  levofloxacin (LEVAQUIN) tablet 750 mg  Status:  Discontinued     750 mg Oral Daily 07/19/16 1146 07/21/16 1013   07/18/16 2200  levofloxacin (LEVAQUIN) IVPB 750 mg  Status:  Discontinued     750 mg 100 mL/hr over 90 Minutes Intravenous Every 24 hours 07/18/16 0049 07/19/16 1146   07/18/16 1200  vancomycin (VANCOCIN) 500 mg in sodium chloride 0.9 % 100 mL IVPB     500 mg 100 mL/hr over 60 Minutes Intravenous 2 times daily 07/18/16 0921     07/18/16 0200  aztreonam (AZACTAM) 2 g in dextrose 5 % 50 mL IVPB  Status:  Discontinued     2 g 100 mL/hr over 30 Minutes Intravenous Every 8 hours 07/18/16 0053 07/21/16 1128   07/18/16 0200  vancomycin (VANCOCIN) IVPB 1000 mg/200 mL premix  Status:  Discontinued     1,000 mg 200 mL/hr over 60 Minutes Intravenous Every 24 hours 07/18/16 0058 07/18/16 0921   07/17/16 2300  levofloxacin (LEVAQUIN) IVPB 750 mg     750 mg 100 mL/hr over 90 Minutes Intravenous  Once 07/17/16 2255 07/18/16 0100       Objective: Vitals:   07/21/16 0400 07/21/16 0614 07/21/16 0800 07/21/16 0952  BP:      Pulse:    (!) 142  Resp:    (!) 25  Temp: 98.2 F (36.8 C)  98.2 F (36.8 C)   TempSrc: Axillary  Oral   SpO2:  94%  94%  Weight:      Height:        Intake/Output Summary (Last 24 hours) at 07/21/16 1237 Last data filed at 07/21/16 0646  Gross per 24 hour  Intake              400 ml  Output              700 ml  Net             -300 ml     Filed Weights   07/18/16 0200 07/18/16 0322 07/20/16 1157  Weight: 62.5 kg (137 lb 12.6 oz) 65.8 kg (145 lb 1 oz) 65.9 kg (145 lb 4.5 oz)    Examination: General exam: Appears comfortable  HEENT: PERRLA, oral mucosa moist, no sclera icterus - thrush has resolved Respiratory system: Clear to  auscultation. Respiratory effort normal on BiPAP this AM Cardiovascular system: S1 & S2 heard, RRR.  No murmurs  Gastrointestinal system: Abdomen soft, non-tender, nondistended. Normal bowel sound. No organomegaly Central nervous system: alert, confused to time and place. No focal neurological deficits. Extremities: No cyanosis, clubbing or edema Skin: No rashes or ulcers Psychiatry:  Mood & affect appropriate.     Data Reviewed: I have personally reviewed following labs and imaging studies  CBC:  Recent Labs Lab 07/17/16 2219 07/19/16 0326 07/20/16 0351 07/21/16 0316  WBC 32.1* 23.1* 18.5* 15.7*  NEUTROABS 28.6*  --   --   --   HGB 12.1 9.8* 9.0* 9.2*  HCT 37.8 31.4* 28.8* 29.7*  MCV 87.9 89.2 89.2 90.5  PLT 288 250 284 277   Basic Metabolic Panel:  Recent Labs Lab 07/17/16 2219 07/18/16 0249 07/18/16 0904 07/19/16 0326 07/21/16 0316  NA 130* 131* 132* 135 141  K 4.0 3.9 4.5 4.0 3.8  CL 84* 92* 96* 97* 105  CO2 35* 30 28 27 30   GLUCOSE 91 120* 111* 101* 99  BUN 17 16 15 14 14   CREATININE 1.18* 0.91 1.04* 0.71 0.57  CALCIUM 8.6* 7.4* 7.2* 7.8* 8.3*   GFR: Estimated Creatinine Clearance: 67.4 mL/min (by C-G formula based on SCr of 0.57 mg/dL). Liver Function Tests:  Recent Labs Lab 07/17/16 2219  AST 20  ALT 14  ALKPHOS 54  BILITOT 1.3*  PROT 7.4  ALBUMIN 3.8   No results for input(s): LIPASE, AMYLASE in the last 168 hours. No results for input(s): AMMONIA in the last 168 hours. Coagulation Profile:  Recent Labs Lab 07/18/16 0249 07/19/16 0326 07/20/16 0351  INR 1.51 1.34 1.14   Cardiac Enzymes: No results for input(s): CKTOTAL, CKMB, CKMBINDEX,  TROPONINI in the last 168 hours. BNP (last 3 results) No results for input(s): PROBNP in the last 8760 hours. HbA1C: No results for input(s): HGBA1C in the last 72 hours. CBG: No results for input(s): GLUCAP in the last 168 hours. Lipid Profile: No results for input(s): CHOL, HDL, LDLCALC, TRIG, CHOLHDL, LDLDIRECT in the last 72 hours. Thyroid Function Tests: No results for input(s): TSH, T4TOTAL, FREET4, T3FREE, THYROIDAB in the last 72 hours. Anemia Panel: No results for input(s): VITAMINB12, FOLATE, FERRITIN, TIBC, IRON, RETICCTPCT in the last 72 hours. Urine analysis:    Component Value Date/Time   COLORURINE AMBER (A) 07/18/2016 0810   APPEARANCEUR CLOUDY (A) 07/18/2016 0810   LABSPEC 1.017 07/18/2016 0810   PHURINE 6.0 07/18/2016 0810   GLUCOSEU NEGATIVE 07/18/2016 0810   HGBUR NEGATIVE 07/18/2016 0810   BILIRUBINUR SMALL (A) 07/18/2016 0810   KETONESUR NEGATIVE 07/18/2016 0810   PROTEINUR 30 (A) 07/18/2016 0810   UROBILINOGEN 0.2 09/19/2010 1112   NITRITE NEGATIVE 07/18/2016 0810   LEUKOCYTESUR SMALL (A) 07/18/2016 0810   Sepsis Labs: @LABRCNTIP (procalcitonin:4,lacticidven:4) ) Recent Results (from the past 240 hour(s))  Blood Culture (routine x 2)     Status: None (Preliminary result)   Collection Time: 07/17/16 10:18 PM  Result Value Ref Range Status   Specimen Description BLOOD LEFT ANTECUBITAL  Final   Special Requests BOTTLES DRAWN AEROBIC AND ANAEROBIC 5CC  Final   Culture   Final    NO GROWTH 3 DAYS Performed at [facility]    Report Status PENDING  Incomplete  Blood Culture (routine x 2)     Status: None (Preliminary result)   Collection Time: 07/17/16 10:20 PM  Result Value Ref Range Status   Specimen Description BLOOD RIGHT  ANTECUBITAL  Final   Special Requests BOTTLES DRAWN AEROBIC AND ANAEROBIC 5CC  Final   Culture   Final    NO GROWTH 3 DAYS Performed at [facility]    Report Status PENDING  Incomplete  MRSA PCR Screening      Status: Abnormal   Collection Time: 07/18/16  2:11 AM  Result Value Ref Range Status   MRSA by PCR POSITIVE (A) NEGATIVE Final    Comment:        The GeneXpert MRSA Assay (FDA approved for NASAL specimens only), is one component of a comprehensive MRSA colonization surveillance program. It is not intended to diagnose MRSA infection nor to guide or monitor treatment for MRSA infections. RESULT CALLED TO, READ BACK BY AND VERIFIED WITH: J MCNULTY RN 0503 07/18/16 A NAVARRO   Respiratory Panel by PCR     Status: None   Collection Time: 07/18/16  2:11 AM  Result Value Ref Range Status   Adenovirus NOT DETECTED NOT DETECTED Final   Coronavirus 229E NOT DETECTED NOT DETECTED Final   Coronavirus HKU1 NOT DETECTED NOT DETECTED Final   Coronavirus NL63 NOT DETECTED NOT DETECTED Final   Coronavirus OC43 NOT DETECTED NOT DETECTED Final   Metapneumovirus NOT DETECTED NOT DETECTED Final   Rhinovirus / Enterovirus NOT DETECTED NOT DETECTED Final   Influenza A NOT DETECTED NOT DETECTED Final   Influenza B NOT DETECTED NOT DETECTED Final   Parainfluenza Virus 1 NOT DETECTED NOT DETECTED Final   Parainfluenza Virus 2 NOT DETECTED NOT DETECTED Final   Parainfluenza Virus 3 NOT DETECTED NOT DETECTED Final   Parainfluenza Virus 4 NOT DETECTED NOT DETECTED Final   Respiratory Syncytial Virus NOT DETECTED NOT DETECTED Final   Bordetella pertussis NOT DETECTED NOT DETECTED Final   Chlamydophila pneumoniae NOT DETECTED NOT DETECTED Final   Mycoplasma pneumoniae NOT DETECTED NOT DETECTED Final    Comment: Performed at [facility]  Urine culture     Status: Abnormal   Collection Time: 07/18/16  8:10 AM  Result Value Ref Range Status   Specimen Description URINE, CLEAN CATCH  Final   Special Requests NONE  Final   Culture MULTIPLE SPECIES PRESENT, SUGGEST RECOLLECTION (A)  Final   Report Status 07/19/2016 FINAL  Final         Radiology Studies: Dg Chest Port 1 View  Result Date:  07/21/2016 CLINICAL DATA:  Hypoxia.  History of asthma and COPD. EXAM: PORTABLE CHEST 1 VIEW COMPARISON:  07/17/2016 and 06/04/2016 FINDINGS: Right upper lobe consolidation has increased when compared to the prior exam, new since 06/04/2016. Remainder of the lungs is clear. Lungs are hyperexpanded. No convincing pleural effusion.  No pneumothorax. Cardiac silhouette is normal in size. No mediastinal or hilar masses are evident. IMPRESSION: 1. Increased airspace consolidation noted in the right upper lobe since the most recent prior exam. This is consistent with worsened pneumonia. 2. No other acute abnormalities. 3. COPD. Electronically Signed   By: Amie Portland M.D.   On: 07/21/2016 10:53      Scheduled Meds: . ALPRAZolam  1 mg Oral QHS  . calcium citrate  950 mg Oral Daily  . Chlorhexidine Gluconate Cloth  6 each Topical Q0600  . enoxaparin (LOVENOX) injection  40 mg Subcutaneous Q24H  . fluticasone  1 spray Each Nare Daily  . ipratropium  0.5 mg Nebulization TID  . levalbuterol  0.63 mg Nebulization TID  . methylPREDNISolone (SOLU-MEDROL) injection  60 mg Intravenous Q24H  . mupirocin ointment  1 application Nasal BID  . pantoprazole  40 mg Oral Daily  . piperacillin-tazobactam (ZOSYN)  IV  3.375 g Intravenous Q8H  . traZODone  50 mg Oral QHS  . vancomycin  500 mg Intravenous BID   Continuous Infusions: . sodium chloride       LOS: 3 days    Time spent in minutes: 45 Greater than 50 % of time spent in speaking with family members and coordinating care.     Calvert CantorIZWAN,Juddson Cobern, MD [facility] Pager: [email or web address] Password [email or web address] 07/21/2016, 12:37 PM

## 2016-07-21 NOTE — Progress Notes (Addendum)
[facility] room 1240 [facility] ([facility])-  [facility] Liaison Note  This is a GIP related and covered admission as of 07/18/16 per Dr. Anne Fu. Patient has [facility] diagnosis of COPD, admitted to [facility] 02/03/16. Code status while at home was Full code. Note Code status changed 07/18/16 to DNR. Patient came to hospital with fever, chills and shortness of breath.   Visited patient at bedside twice this morning and received report from bedside RN Corrie Dandy. Patient was asleep during both visits. No family present during first visit. Daughter Kenney Houseman and patient's friend present during second visit. They shared concerns over patient's recent confusion. Friend encouraged that patient recognized him earlier this morning and then went right back to sleep. He tells me she did not recognize him at all yesterday. Kenney Houseman shared that Dr. Linna Darner spoke with her sister [name] earlier this morning and they are aware of attempts to wean from BiPAP. They have questions about oxygen support overnight. Encouraged them to speak with bedside RN. They continue to plan for patient's return to home with hospice services. Offered support and encouragement. Assured them [facility] liaison follows daily while in the hospital and collaborates with hospital staff.   [facility] Liaison will see patient daily.  Please call if we can assist in any way.   Thank you, Forrestine Him, [facility] Liaison [phone]

## 2016-07-21 NOTE — Progress Notes (Addendum)
Pharmacy Antibiotic Note  Wendy [name] is a 55 y.o. female admitted on 07/17/2016 with pneumonia.  Pharmacy has been consulted for vancomycin and levofloxacin dosing.  Patient also on aztreonam UPDATE: d/c levofloxacin and aztreonam, pharmacy consulted to dose zosyn  D4 antibotics - renal: SCr improved - afebrile - WBC elevated   Plan:  Zosyn 3.375g IV Q8H infused over 4hrs.  Continue  Vancomycin 500mg  IV q12h (VT=15-20 mg/L)   Patient with documented intolerance to Amox/clav,  She was given several doses of zosyn in 2015.    Height: 5\' 1"  (154.9 cm) Weight: 145 lb 4.5 oz (65.9 kg) IBW/kg (Calculated) : 47.8  Temp (24hrs), Avg:97.9 F (36.6 C), Min:97.3 F (36.3 C), Max:98.2 F (36.8 C)   Recent Labs Lab 07/17/16 2219 07/17/16 2231 07/18/16 0249 07/18/16 0604 07/18/16 0904 07/19/16 0326 07/20/16 0351 07/21/16 0316  WBC 32.1*  --   --   --   --  23.1* 18.5* 15.7*  CREATININE 1.18*  --  0.91  --  1.04* 0.71  --  0.57  LATICACIDVEN  --  2.16* 1.7 0.9  --   --   --   --     Estimated Creatinine Clearance: 67.4 mL/min (by C-G formula based on SCr of 0.57 mg/dL).    Allergies  Allergen Reactions  . Augmentin [Amoxicillin-Pot Clavulanate] Nausea Only    Has patient had a PCN reaction causing immediate rash, facial/tongue/throat swelling, SOB or lightheadedness with hypotension: No Has patient had a PCN reaction causing severe rash involving mucus membranes or skin necrosis: No Has patient had a PCN reaction that required hospitalization No Has patient had a PCN reaction occurring within the last 10 years: Yes If all of the above answers are "NO", then may proceed with Cephalosporin use.   . Cefdinir Other (See Comments)    Aches, "heart fluttering"  . Doxycycline Nausea And Vomiting  . Other Nausea Only    PAIN MEDICATIONS-nausea  . Codeine Nausea Only  . Fexofenadine Nausea Only  . Hydralazine     Side effect Felt woozy    Antimicrobials this  admission: 10/31 levaquin >> 11/4 11/1 vancomycin >>  11/1 azactam >>11/4 11/4 zosyn >>  Dose adjustments this admission:  11/1 vanco to 500mg  q12h for improved SCr  Microbiology results:  11/1 BCx: ngtd 11/1 UCx: multi species, need recollection 11/1 MRSA PCR: positive 11/1: flu PCR: neg 11/1 resp virus panel: neg  Thank you for allowing pharmacy to be a part of this patient's care.  13/1 RPh 07/21/2016, 11:15 AM Pager [phone]

## 2016-07-21 NOTE — Progress Notes (Signed)
Daily Progress Note   Patient Name: Wendy [name]       Date: 07/21/2016 DOB: [birth year]  Age: 55 y.o. MRN#: [id number] Attending Physician: Calvert Cantor, MD Primary Care Physician: Lora Paula, MD Admit Date: 07/17/2016  Reason for Consultation/Follow-up: Hospice Evaluation, Non pain symptom management and Psychosocial/spiritual support  Subjective:   Patient is awake, she is alert, resting in bed.  She is how ever short of breath with pursed lip breathing. She is in mild distress.  Call placed and discussed with [name]: see below.    Length of Stay: 3  Current Medications: Scheduled Meds:  . ALPRAZolam  1 mg Oral QHS  . aztreonam  2 g Intravenous Q8H  . calcium citrate  950 mg Oral Daily  . Chlorhexidine Gluconate Cloth  6 each Topical Q0600  . doxycycline (VIBRAMYCIN) IV  100 mg Intravenous Q12H  . enoxaparin (LOVENOX) injection  40 mg Subcutaneous Q24H  . fluticasone  1 spray Each Nare Daily  . ipratropium  0.5 mg Nebulization TID  . levalbuterol  0.63 mg Nebulization TID  . methylPREDNISolone (SOLU-MEDROL) injection  60 mg Intravenous Q24H  . mupirocin ointment  1 application Nasal BID  . pantoprazole  40 mg Oral Daily  . traZODone  50 mg Oral QHS  . vancomycin  500 mg Intravenous BID    Continuous Infusions:    PRN Meds: acetaminophen, acetaminophen, ALPRAZolam, morphine CONCENTRATE  Physical Exam         Diminished breath sounds anteriorly S1 S2 distant Abdomen soft Dry skin peeling on both extremities, ? Eczema Awake, alert.   Vital Signs: BP (!) 141/67   Pulse (!) 142 Comment: md aware- in room  Temp 98.2 F (36.8 C) (Oral)   Resp (!) 25   Ht 5\' 1"  (1.549 m)   Wt 65.9 kg (145 lb 4.5 oz)   SpO2 94%   BMI 27.45 kg/m  SpO2: SpO2: 94  % O2 Device: O2 Device: Bi-PAP O2 Flow Rate: O2 Flow Rate (L/min): 4 L/min  Intake/output summary:  Intake/Output Summary (Last 24 hours) at 07/21/16 1017 Last data filed at 07/21/16 0646  Gross per 24 hour  Intake             1100 ml  Output  700 ml  Net              400 ml   LBM: Last BM Date: 07/16/16 Baseline Weight: Weight: 62.5 kg (137 lb 12.6 oz) Most recent weight: Weight: 65.9 kg (145 lb 4.5 oz)       Palliative Assessment/Data:    Flowsheet Rows   Flowsheet Row Most Recent Value  Intake Tab  Referral Department  Hospitalist  Unit at Time of Referral  ICU  Palliative Care Primary Diagnosis  Pulmonary  Palliative Care Type  Return patient Palliative Care  Reason for referral  Non-pain Symptom, Clarify Goals of Care  Date first seen by Palliative Care  07/20/16  Clinical Assessment  Palliative Performance Scale Score  30%  Pain Max last 24 hours  4  Pain Min Last 24 hours  3  Dyspnea Max Last 24 Hours  7  Dyspnea Min Last 24 hours  5  Nausea Max Last 24 Hours  3  Nausea Min Last 24 Hours  2  Anxiety Max Last 24 Hours  6  Anxiety Min Last 24 Hours  5  Psychosocial & Spiritual Assessment  Palliative Care Outcomes  Patient/Family meeting held?  Yes  Who was at the meeting?  call placed and discussed with daughter [facility]   Palliative Care Outcomes  Clarified goals of care  Palliative Care follow-up planned  Yes, Home      Patient Active Problem List   Diagnosis Date Noted  . GERD (gastroesophageal reflux disease) 07/18/2016  . HCAP (healthcare-associated pneumonia) 07/18/2016  . AKI (acute kidney injury) (HCC) 07/18/2016  . Epidural hematoma (HCC)   . Pain   . Burst fracture of T12 vertebra (HCC) 06/04/2016  . Foot swelling 03/06/2016  . Anemia 01/23/2016  . COPD exacerbation (HCC) 01/17/2016  . Transaminasemia 01/17/2016  . DNR (do not resuscitate) discussion   . Acute on chronic respiratory failure with hypoxia (HCC) 12/04/2015  .  Dyspnea   . Palliative care encounter   . Community acquired pneumonia   . Hypercapnic respiratory failure, chronic (HCC)   . OSA (obstructive sleep apnea)   . Immunosuppressed status (HCC)   . Sepsis (HCC) 11/22/2015  . Chronic respiratory failure with hypoxia (HCC) 11/20/2015  . Insomnia 06/05/2015  . Hoarseness 05/17/2015  . Vitamin D deficiency 11/17/2014  . Decreased visual acuity 11/17/2014  . Chronic diastolic heart failure (HCC) 07/02/2014  . Lung nodules 11/08/2013  . Hyponatremia 10/06/2013  . Alcohol abuse, hx of 12/01/2012    Class: Chronic  . Overdose of benzodiazepine, hx of 11/27/2012  . Alcohol withdrawal, hx of 11/27/2012  . Psoriasis 08/18/2011  . Chronic rhinitis 11/06/2010  . Adjustment disorder with mixed anxiety and depressed mood 08/24/2010  . TOBACCO ABUSE, hx of 03/09/2008  . COPD mixed type (HCC) 03/09/2008    Palliative Care Assessment & Plan   Patient Profile:    Assessment:  Advanced COPD PNA Acute on chronic hypoxic and hypercapneic resp failure Anxiety air hunger Recent T12 fracture Hospice patient.   Recommendations/Plan: 1. BiPAP as a comfort measure, prn. ABG results discussed with daughter.  2. Remains on Abx, resp therapy following, in step down: discussed with [name] over the phone in detail this morning: diligent efforts are being made to address this acute exacerbation and PNA management, how ever patient does have hgih risk for ongoing decline or even sudden clinical decompensation as she has underlying serious irreversible illness of COPD and has hence been enrolled in hospice care since May  2017. Overall, patient and family are desiring for continuation of hospice care at home after discharge, how ever, they are hopeful for some degree of stabilization or recovery in this hospitalization. Palliative will continue to follow along.     Code Status:    Code Status Orders        Start     Ordered   07/18/16 0053  Do  not attempt resuscitation (DNR)  Continuous    Question Answer Comment  In the event of cardiac or respiratory ARREST Do not call a "code blue"   In the event of cardiac or respiratory ARREST Do not perform Intubation, CPR, defibrillation or ACLS   In the event of cardiac or respiratory ARREST Use medication by any route, position, wound care, and other measures to relive pain and suffering. May use oxygen, suction and manual treatment of airway obstruction as needed for comfort.      07/18/16 0053    Code Status History    Date Active Date Inactive Code Status Order ID Comments User Context   07/17/2016 10:57 PM 07/18/2016 12:53 AM DNR [id number]  Lyndal Pulley, MD ED   06/04/2016 10:19 PM 06/06/2016  4:13 PM Full Code [id number]  Hillary Bow, DO ED   01/17/2016  5:45 PM 01/19/2016  2:19 PM Full Code [id number]  Catarina Hartshorn, MD ED   12/10/2015 11:07 AM 12/21/2015  3:03 PM DNR [id number]  Nelda Bucks, MD Inpatient   12/04/2015 11:46 PM 12/10/2015 11:07 AM Full Code [id number]  Storm Frisk, MD Inpatient   11/22/2015  3:06 PM 11/30/2015  5:15 PM Full Code [id number]  Denton Brick, MD ED   07/01/2014  5:41 PM 07/04/2014  5:14 PM DNR [id number]  Esperanza Sheets, MD Inpatient   10/06/2013  1:01 AM 10/13/2013  7:25 PM Full Code [id number]  Hillary Bow, DO ED   11/27/2012  3:47 AM 12/01/2012  6:46 PM Full Code [id number]  Lupita Leash, MD Inpatient    Advance Directive Documentation   Flowsheet Row Most Recent Value  Type of Advance Directive  Healthcare Power of Attorney, Living will  Pre-existing out of facility DNR order (yellow form or pink MOST form)  No data  "MOST" Form in Place?  No data       Prognosis:   < 6 months  Discharge Planning:  Home with Hospice versus residential hospice, if not able to be weaned from BiPAP or if ongoing resp status decline continues. Will need to discuss further with daughters who are focused on getting the patient better.   Care plan was discussed with   [name] over the phone, bedside RN Corrie Dandy, RT.   Thank you for allowing the Palliative Medicine Team to assist in the care of this patient.   Time In: 9 Time Out: 935 Total Time 35 Prolonged Time Billed  no       Greater than 50%  of this time was spent counseling and coordinating care related to the above assessment and plan.  Rosalin Hawking, MD [phone]  Please contact Palliative Medicine Team phone at [phone] for questions and concerns.

## 2016-07-22 LAB — URINALYSIS, ROUTINE W REFLEX MICROSCOPIC
Bilirubin Urine: NEGATIVE
Glucose, UA: NEGATIVE mg/dL
Hgb urine dipstick: NEGATIVE
KETONES UR: NEGATIVE mg/dL
LEUKOCYTES UA: NEGATIVE
NITRITE: NEGATIVE
PH: 6.5 (ref 5.0–8.0)
Protein, ur: NEGATIVE mg/dL
Specific Gravity, Urine: 1.021 (ref 1.005–1.030)

## 2016-07-22 LAB — CBC
HCT: 29.9 % — ABNORMAL LOW (ref 36.0–46.0)
HEMOGLOBIN: 9.2 g/dL — AB (ref 12.0–15.0)
MCH: 27.7 pg (ref 26.0–34.0)
MCHC: 30.8 g/dL (ref 30.0–36.0)
MCV: 90.1 fL (ref 78.0–100.0)
Platelets: 370 10*3/uL (ref 150–400)
RBC: 3.32 MIL/uL — AB (ref 3.87–5.11)
RDW: 13.8 % (ref 11.5–15.5)
WBC: 11.7 10*3/uL — AB (ref 4.0–10.5)

## 2016-07-22 LAB — BASIC METABOLIC PANEL
ANION GAP: 9 (ref 5–15)
BUN: 22 mg/dL — AB (ref 6–20)
CHLORIDE: 106 mmol/L (ref 101–111)
CO2: 28 mmol/L (ref 22–32)
Calcium: 8.2 mg/dL — ABNORMAL LOW (ref 8.9–10.3)
Creatinine, Ser: 0.58 mg/dL (ref 0.44–1.00)
GFR calc non Af Amer: >60 mL/min (ref >60)
Glucose, Bld: 106 mg/dL — ABNORMAL HIGH (ref 65–99)
POTASSIUM: 4.2 mmol/L (ref 3.5–5.1)
SODIUM: 143 mmol/L (ref 135–145)

## 2016-07-22 LAB — TROPONIN I
TROPONIN I: 0.03 ng/mL — AB (ref <0.03)
Troponin I: <0.03 ng/mL (ref <0.03)

## 2016-07-22 LAB — MAGNESIUM: Magnesium: 1.7 mg/dL (ref 1.7–2.4)

## 2016-07-22 MED ORDER — METOPROLOL TARTRATE 5 MG/5ML IV SOLN
INTRAVENOUS | Status: AC
  Filled 2016-07-22: qty 5

## 2016-07-22 MED ORDER — FUROSEMIDE 10 MG/ML IJ SOLN
40.0000 mg | Freq: Once | INTRAMUSCULAR | Status: AC
  Administered 2016-07-22: 40 mg via INTRAVENOUS
  Filled 2016-07-22: qty 4

## 2016-07-22 MED ORDER — NITROGLYCERIN 2 % TD OINT
0.5000 [in_us] | TOPICAL_OINTMENT | Freq: Four times a day (QID) | TRANSDERMAL | Status: DC
  Administered 2016-07-22 – 2016-07-23 (×5): 0.5 [in_us] via TOPICAL
  Filled 2016-07-22: qty 30

## 2016-07-22 MED ORDER — ADENOSINE 6 MG/2ML IV SOLN
INTRAVENOUS | Status: AC
  Filled 2016-07-22: qty 2

## 2016-07-22 MED ORDER — METOPROLOL TARTRATE 5 MG/5ML IV SOLN: 5.0000 mg | INTRAVENOUS | Status: DC | PRN

## 2016-07-22 MED ORDER — ADENOSINE 6 MG/2ML IV SOLN: 6.0000 mg | Freq: Once | INTRAVENOUS | Status: DC

## 2016-07-22 MED ORDER — METHYLPREDNISOLONE SODIUM SUCC 125 MG IJ SOLR
60.0000 mg | Freq: Two times a day (BID) | INTRAMUSCULAR | Status: DC
  Administered 2016-07-22 – 2016-07-23 (×2): 60 mg via INTRAVENOUS
  Filled 2016-07-22 (×2): qty 2

## 2016-07-22 MED ORDER — METOPROLOL TARTRATE 5 MG/5ML IV SOLN
5.0000 mg | Freq: Once | INTRAVENOUS | Status: AC
  Administered 2016-07-22: 5 mg via INTRAVENOUS

## 2016-07-22 MED ORDER — METOPROLOL TARTRATE 5 MG/5ML IV SOLN
5.0000 mg | INTRAVENOUS | Status: DC | PRN
  Administered 2016-07-23: 5 mg via INTRAVENOUS
  Filled 2016-07-22: qty 5

## 2016-07-22 NOTE — Progress Notes (Signed)
Patient remains off of NIV with no complications. NIV on standby if needed. Will continue to monitor pt.

## 2016-07-22 NOTE — Progress Notes (Signed)
[facility] - RN GIP Visit - Wendy, [name] - [place] Room #9357  This is a GIP related and covered admission as of 07/18/16 per Dr. Anne Fu. Patient has [facility] diagnosis of COPD, admitted to [facility] 02/03/16. Code status while at home was Full code. This has changed a couple of times during patient's hospitalization and she is a DNR this morning.  Patient came to hospital with fever, chills and shortness of breath.   Visit at bedside this morning.  Patient is currently not wearing her Bipap and nurse at bedside reports that it is very important to daughters that Bipap be removed for patient to eat.  Noted during this visit that patient keeps eyes closed almost the entire time and states that she is "very tired."  Drifts in and out of sleep.  Breathing is slightly labored.  Skin is pale.  Patient continues to receive fluids, antibiotics, and requires frequent nursing assessment.  Dr. Linna Darner at bedside during visit.  She reports that there have been ongoing conversations with family regarding goals of care and these goals have vacillated.  Call to daughter [name] and was unable to reach.  Left voicemail and will attempt to reach again later today to discuss patient's status.  Hospital team and [facility] will continue to collaborate to support patient, her family, and to work on goals of care.  [facility] Liaison will be making daily visits.  Please call if we can assist in any way.  Thank you, Katy Apo, RN, BSN, [facility] Director of Inpatient Services  [facility] is now on AMION and the liaison listing is posted daily.  Feel free to reach out to a liaison directly or to call our main [facility] number at [phone].  I can be reached today 07/22/16 at [phone].

## 2016-07-22 NOTE — Progress Notes (Signed)
PROGRESS NOTE    Wendy Brenneratricia L Wernert  JYN:[id number] DOB: 1961-01-30 DOA: 07/17/2016  PCP: Lora PaulaFUNCHES, JOSALYN C, MD   Brief Narrative:  Wendy [name] is a 55 y.o. female with medical history significant of COPD under hospice care, GERD, depression, anxiety, former alcoholism, psoriasis, dCHF, former smoker, who presents with fever, chills, shortness of breath, generalized weakness and right upper chest pain for past few days.   She had a recent admission from 9/18- 9/20 for traumatic T 12 burst fracture.   Subjective: Alert and oriented. Does not want to be intubated at all.   Assessment & Plan:   Principal Problem:   Acute on chronic respiratory failure with hypoxia/ severe sepsis -11/4-  Family states that patient has a living will stating that she would like to be intubated but does not want to be maintained on life support indefinitely- changed code status to Full Code- alerted PCCM.  - patient alert and oriented today- would like to be DNR - still quite short of breath- will stop IVF, Give Lasix- place on Hi flow [place] per PCCM recommendations  (A)  HCAP (healthcare-associated pneumonia) - RUL with symptoms of dyspnea and fever (102)- tachycardic- Lactic acid 2.16- has improved- procalcitonin 1.96 - currently on Vanc, Azactam and Levaquin due to PCN allergy but in the past she has been able to tolerate Zosyn - due to increasing confusion, have stopped Levaquin (she has had 5 days) and switched to Zosyn  - MRSA PCR + so continue Vanc - SLP eval to assess whether she has issues with silent aspiration was cancelled by SLP - influenza and resp viral panel negative  -  blood cultures NGTD - CXR repeated today to essentially look for pulm edema as she was more hypoxic- this shows progression of infiltrate but she is clinically improving if regards to the pneumonia- no further fevers and leukocytosis improving  (B) COPD exacerbation - on 3 L of O 2 at baseline which has not changed -cont  Nebs, steriods - on hospice for severe COPD and on 20 mg of Prednisone daily at baseline - oral Morphine PRN - 11/3>> ABG showing pH of 7.29 and PCO2 of 63 (which is her baseline)- placed on BiPAP and transfered to SDU - palliative care consulted to again address GOC with patient and family   (C) OSA - has told me and tech that she does not always wear her BiPAP at home - BiPAP setting per prior pulm note was 12/8  Active Problems:  Acute encephalopathy? Seeing bugs -  she is able to converse and remembers me each day. RN noted "confusion" last night which improved after BiPAP but no mention of seeing bugs. She did not tell me about her visual hallucinations either but apparently only told her daughters - per daughters, she takes Xanax 1mg  QID at home (perscription states TID PRN and QHS)- she has been always asleep (but alert and oriented when aroused) when I enter the room and due to risk for hypercapnia, it was not resumed in the daytime - resumed Xanax on daughters' request- patient remains alert and still confused - changed Levaquin to Zosyn as mentioned above - confusion and hallucinations resolved  Hypertensive Urgency - have been giving Clonidine PRN- likely is due to NS, steroids and stress - will place Nitro patch  Hyponatremia/ mild AKI - due to dehydration-improved with NS at 100 cc/hr- continue  SVT  - occurred 11/1 after she got back to bed from the chair - resolved  after Adenosine- ECHO done in March and therefore not repeated - continue to follow on telemetry  Traumatic T12 fracture on 9/18 - no back pain  Thrush - Nystatin- follow  Elevated PT/ INR - recheckd  - mildly elevated still at 16.7/ 1.34- improved to normal after 5 mg oral Vit K    Chronic diastolic heart failure - grade 1 diastolic dysfunction on 3/17    GERD (gastroesophageal reflux disease) - Protonix  Lung nodules  - 2 nodules noted on CT on 9/17   DVT prophylaxis: Lovenox Code Status:  DNR Family Communication: daughters Disposition Plan: follow in SDU Consultants:   Palliative care Procedures:    Antimicrobials:  Anti-infectives    Start     Dose/Rate Route Frequency Ordered Stop   07/21/16 1200  doxycycline (VIBRAMYCIN) 100 mg in dextrose 5 % 250 mL IVPB  Status:  Discontinued     100 mg 125 mL/hr over 120 Minutes Intravenous Every 12 hours 07/21/16 1013 07/21/16 1128   07/21/16 1200  piperacillin-tazobactam (ZOSYN) IVPB 3.375 g     3.375 g 12.5 mL/hr over 240 Minutes Intravenous Every 8 hours 07/21/16 1134     07/20/16 0000  levofloxacin (LEVAQUIN) 750 MG tablet     750 mg Oral Daily 07/20/16 0837     07/20/16 0000  sulfamethoxazole-trimethoprim (BACTRIM) 400-80 MG tablet     1 tablet Oral 2 times daily 07/20/16 0838     07/19/16 2200  levofloxacin (LEVAQUIN) tablet 750 mg  Status:  Discontinued     750 mg Oral Daily 07/19/16 1146 07/21/16 1013   07/18/16 2200  levofloxacin (LEVAQUIN) IVPB 750 mg  Status:  Discontinued     750 mg 100 mL/hr over 90 Minutes Intravenous Every 24 hours 07/18/16 0049 07/19/16 1146   07/18/16 1200  vancomycin (VANCOCIN) 500 mg in sodium chloride 0.9 % 100 mL IVPB     500 mg 100 mL/hr over 60 Minutes Intravenous 2 times daily 07/18/16 0921     07/18/16 0200  aztreonam (AZACTAM) 2 g in dextrose 5 % 50 mL IVPB  Status:  Discontinued     2 g 100 mL/hr over 30 Minutes Intravenous Every 8 hours 07/18/16 0053 07/21/16 1128   07/18/16 0200  vancomycin (VANCOCIN) IVPB 1000 mg/200 mL premix  Status:  Discontinued     1,000 mg 200 mL/hr over 60 Minutes Intravenous Every 24 hours 07/18/16 0058 07/18/16 0921   07/17/16 2300  levofloxacin (LEVAQUIN) IVPB 750 mg     750 mg 100 mL/hr over 90 Minutes Intravenous  Once 07/17/16 2255 07/18/16 0100       Objective: Vitals:   07/22/16 0000 07/22/16 0344 07/22/16 0400 07/22/16 0500  BP:   (!) 179/80 (!) 195/76  Pulse:  66 67 64  Resp:  20 18 19   Temp: 97.4 F (36.3 C)  98.1 F (36.7 C)     TempSrc: Axillary  Axillary   SpO2:  94% 95% 95%  Weight:      Height:        Intake/Output Summary (Last 24 hours) at 07/22/16 0836 Last data filed at 07/22/16 0530  Gross per 24 hour  Intake             1330 ml  Output             1250 ml  Net               80 ml   Filed Weights   07/18/16 0200 07/18/16 0322  07/20/16 1157  Weight: 62.5 kg (137 lb 12.6 oz) 65.8 kg (145 lb 1 oz) 65.9 kg (145 lb 4.5 oz)    Examination: General exam: Appears un-comfortable HEENT: PERRLA, oral mucosa moist, no sclera icterus - thrush has resolved Respiratory system: Clear to auscultation.   - pursed lip breathing off of BiPAP- p Ox 85 % on 2 L Cardiovascular system: S1 & S2 heard, RRR.  No murmurs  Gastrointestinal system: Abdomen soft, non-tender, nondistended. Normal bowel sound. No organomegaly Central nervous system: alert, confused to time and place. No focal neurological deficits. Extremities: No cyanosis, clubbing or edema Skin: No rashes or ulcers Psychiatry:  Mood & affect appropriate.     Data Reviewed: I have personally reviewed following labs and imaging studies  CBC:  Recent Labs Lab 07/17/16 2219 07/19/16 0326 07/20/16 0351 07/21/16 0316  WBC 32.1* 23.1* 18.5* 15.7*  NEUTROABS 28.6*  --   --   --   HGB 12.1 9.8* 9.0* 9.2*  HCT 37.8 31.4* 28.8* 29.7*  MCV 87.9 89.2 89.2 90.5  PLT 288 250 284 277   Basic Metabolic Panel:  Recent Labs Lab 07/17/16 2219 07/18/16 0249 07/18/16 0904 07/19/16 0326 07/21/16 0316  NA 130* 131* 132* 135 141  K 4.0 3.9 4.5 4.0 3.8  CL 84* 92* 96* 97* 105  CO2 35* 30 28 27 30   GLUCOSE 91 120* 111* 101* 99  BUN 17 16 15 14 14   CREATININE 1.18* 0.91 1.04* 0.71 0.57  CALCIUM 8.6* 7.4* 7.2* 7.8* 8.3*   GFR: Estimated Creatinine Clearance: 67.4 mL/min (by C-G formula based on SCr of 0.57 mg/dL). Liver Function Tests:  Recent Labs Lab 07/17/16 2219  AST 20  ALT 14  ALKPHOS 54  BILITOT 1.3*  PROT 7.4  ALBUMIN 3.8   No results  for input(s): LIPASE, AMYLASE in the last 168 hours. No results for input(s): AMMONIA in the last 168 hours. Coagulation Profile:  Recent Labs Lab 07/18/16 0249 07/19/16 0326 07/20/16 0351  INR 1.51 1.34 1.14   Cardiac Enzymes: No results for input(s): CKTOTAL, CKMB, CKMBINDEX, TROPONINI in the last 168 hours. BNP (last 3 results) No results for input(s): PROBNP in the last 8760 hours. HbA1C: No results for input(s): HGBA1C in the last 72 hours. CBG: No results for input(s): GLUCAP in the last 168 hours. Lipid Profile: No results for input(s): CHOL, HDL, LDLCALC, TRIG, CHOLHDL, LDLDIRECT in the last 72 hours. Thyroid Function Tests: No results for input(s): TSH, T4TOTAL, FREET4, T3FREE, THYROIDAB in the last 72 hours. Anemia Panel:  Recent Labs  07/21/16 1030  VITAMINB12 192   Urine analysis:    Component Value Date/Time   COLORURINE AMBER (A) 07/18/2016 0810   APPEARANCEUR CLOUDY (A) 07/18/2016 0810   LABSPEC 1.017 07/18/2016 0810   PHURINE 6.0 07/18/2016 0810   GLUCOSEU NEGATIVE 07/18/2016 0810   HGBUR NEGATIVE 07/18/2016 0810   BILIRUBINUR SMALL (A) 07/18/2016 0810   KETONESUR NEGATIVE 07/18/2016 0810   PROTEINUR 30 (A) 07/18/2016 0810   UROBILINOGEN 0.2 09/19/2010 1112   NITRITE NEGATIVE 07/18/2016 0810   LEUKOCYTESUR SMALL (A) 07/18/2016 0810   Sepsis Labs: @LABRCNTIP (procalcitonin:4,lacticidven:4) ) Recent Results (from the past 240 hour(s))  Blood Culture (routine x 2)     Status: None (Preliminary result)   Collection Time: 07/17/16 10:18 PM  Result Value Ref Range Status   Specimen Description BLOOD LEFT ANTECUBITAL  Final   Special Requests BOTTLES DRAWN AEROBIC AND ANAEROBIC 5CC  Final   Culture   Final  NO GROWTH 3 DAYS Performed at [facility]    Report Status PENDING  Incomplete  Blood Culture (routine x 2)     Status: None (Preliminary result)   Collection Time: 07/17/16 10:20 PM  Result Value Ref Range Status   Specimen  Description BLOOD RIGHT ANTECUBITAL  Final   Special Requests BOTTLES DRAWN AEROBIC AND ANAEROBIC 5CC  Final   Culture   Final    NO GROWTH 3 DAYS Performed at [facility]    Report Status PENDING  Incomplete  MRSA PCR Screening     Status: Abnormal   Collection Time: 07/18/16  2:11 AM  Result Value Ref Range Status   MRSA by PCR POSITIVE (A) NEGATIVE Final    Comment:        The GeneXpert MRSA Assay (FDA approved for NASAL specimens only), is one component of a comprehensive MRSA colonization surveillance program. It is not intended to diagnose MRSA infection nor to guide or monitor treatment for MRSA infections. RESULT CALLED TO, READ BACK BY AND VERIFIED WITH: J MCNULTY RN 0503 07/18/16 A NAVARRO   Respiratory Panel by PCR     Status: None   Collection Time: 07/18/16  2:11 AM  Result Value Ref Range Status   Adenovirus NOT DETECTED NOT DETECTED Final   Coronavirus 229E NOT DETECTED NOT DETECTED Final   Coronavirus HKU1 NOT DETECTED NOT DETECTED Final   Coronavirus NL63 NOT DETECTED NOT DETECTED Final   Coronavirus OC43 NOT DETECTED NOT DETECTED Final   Metapneumovirus NOT DETECTED NOT DETECTED Final   Rhinovirus / Enterovirus NOT DETECTED NOT DETECTED Final   Influenza A NOT DETECTED NOT DETECTED Final   Influenza B NOT DETECTED NOT DETECTED Final   Parainfluenza Virus 1 NOT DETECTED NOT DETECTED Final   Parainfluenza Virus 2 NOT DETECTED NOT DETECTED Final   Parainfluenza Virus 3 NOT DETECTED NOT DETECTED Final   Parainfluenza Virus 4 NOT DETECTED NOT DETECTED Final   Respiratory Syncytial Virus NOT DETECTED NOT DETECTED Final   Bordetella pertussis NOT DETECTED NOT DETECTED Final   Chlamydophila pneumoniae NOT DETECTED NOT DETECTED Final   Mycoplasma pneumoniae NOT DETECTED NOT DETECTED Final    Comment: Performed at [facility]  Urine culture     Status: Abnormal   Collection Time: 07/18/16  8:10 AM  Result Value Ref Range Status   Specimen  Description URINE, CLEAN CATCH  Final   Special Requests NONE  Final   Culture MULTIPLE SPECIES PRESENT, SUGGEST RECOLLECTION (A)  Final   Report Status 07/19/2016 FINAL  Final         Radiology Studies: Dg Chest Port 1 View  Result Date: 07/21/2016 CLINICAL DATA:  Hypoxia.  History of asthma and COPD. EXAM: PORTABLE CHEST 1 VIEW COMPARISON:  07/17/2016 and 06/04/2016 FINDINGS: Right upper lobe consolidation has increased when compared to the prior exam, new since 06/04/2016. Remainder of the lungs is clear. Lungs are hyperexpanded. No convincing pleural effusion.  No pneumothorax. Cardiac silhouette is normal in size. No mediastinal or hilar masses are evident. IMPRESSION: 1. Increased airspace consolidation noted in the right upper lobe since the most recent prior exam. This is consistent with worsened pneumonia. 2. No other acute abnormalities. 3. COPD. Electronically Signed   By: Amie Portland M.D.   On: 07/21/2016 10:53      Scheduled Meds: . ALPRAZolam  1 mg Oral QHS  . calcium citrate  950 mg Oral Daily  . Chlorhexidine Gluconate Cloth  6 each Topical  Q0600  . enoxaparin (LOVENOX) injection  40 mg Subcutaneous Q24H  . fluticasone  1 spray Each Nare Daily  . furosemide  40 mg Intravenous Once  . ipratropium  0.5 mg Nebulization TID  . levalbuterol  0.63 mg Nebulization TID  . methylPREDNISolone (SOLU-MEDROL) injection  60 mg Intravenous Q24H  . mupirocin ointment  1 application Nasal BID  . nitroGLYCERIN  0.5 inch Topical Q6H  . pantoprazole  40 mg Oral Daily  . piperacillin-tazobactam (ZOSYN)  IV  3.375 g Intravenous Q8H  . traZODone  50 mg Oral QHS  . vancomycin  500 mg Intravenous BID   Continuous Infusions:    LOS: 4 days    Time spent in minutes: 45 Greater than 50 % of time spent in speaking with family members and coordinating care.     Calvert Cantor, MD [facility] Pager: [email or web address] Password [facility] 07/22/2016, 8:36 AM

## 2016-07-22 NOTE — Progress Notes (Signed)
[facility] Hospital Liaison Call to Family:  Call to patient's [name] to discuss visit this morning and care plan moving forward.  Discussed that patient is very ill and has "gotten over many bumps in the road" before; however, it is possible that patient may not get over this bump or a future hospitalization may be the last one.  [name] states that she and her sister have been talking about this and that the hospitalizations are getting "closer together" and "worse."  [name] verbalizes understanding of mother's decline and is open to increased support from [facility] in the home or considering [employer].  She expresses wanting to see what happens over the next day or so.  Spoke with Dr. Linna Darner and update provided on this conversation.  [facility] will continue to follow.  Thank you, Katy Apo, RN , BSN, [facility] Director of Inpatient Services [employer] are now on [place].  Please feel free to contact liaison team directly or to call our agency main line at [phone].

## 2016-07-22 NOTE — Progress Notes (Signed)
eLink Physician-Brief Progress Note Patient Name: ZHARIA CONROW DOB: Oct 05, 1960 MRN: [id number]   Date of Service  07/22/2016  HPI/Events of Note  Episodes or SVT with HR = 190-200. BP = 122/79.  eICU Interventions  Will order: 1. BMP and Mg++ level now.  2. Metoprolol 5 mg IV Q 3 hours PRN HR > 115.  3. Cycle Troponin.     Intervention Category Major Interventions: Arrhythmia - evaluation and management  Sommer,Steven Eugene 07/22/2016, 4:26 PM

## 2016-07-22 NOTE — Progress Notes (Signed)
RT placed PT on HFNC per MD - per MD Sp02 goal >=88% (currently on 5 lpm Sp02 99%- titrated from 8 lpm originally). RN aware of device and goal- RT encouraged RN to titrate.

## 2016-07-22 NOTE — Progress Notes (Signed)
Daily Progress Note   Patient Name: Wendy [name]       Date: 07/22/2016 DOB: [birth year]  Age: 55 y.o. MRN#: [id number] Attending Physician: Calvert Cantor, MD Primary Care Physician: Lora Paula, MD Admit Date: 07/17/2016  Reason for Consultation/Follow-up: Hospice Evaluation, Non pain symptom management and Psychosocial/spiritual support  Subjective:   Patient is resting in bed.  Appears less short of breath, see below:  Length of Stay: 4  Current Medications: Scheduled Meds:  . adenosine (ADENOCARD) IV  6 mg Intravenous Once  . ALPRAZolam  1 mg Oral QHS  . calcium citrate  950 mg Oral Daily  . Chlorhexidine Gluconate Cloth  6 each Topical Q0600  . enoxaparin (LOVENOX) injection  40 mg Subcutaneous Q24H  . fluticasone  1 spray Each Nare Daily  . ipratropium  0.5 mg Nebulization TID  . levalbuterol  0.63 mg Nebulization TID  . methylPREDNISolone (SOLU-MEDROL) injection  60 mg Intravenous Q12H  . mupirocin ointment  1 application Nasal BID  . nitroGLYCERIN  0.5 inch Topical Q6H  . pantoprazole  40 mg Oral Daily  . piperacillin-tazobactam (ZOSYN)  IV  3.375 g Intravenous Q8H  . traZODone  50 mg Oral QHS  . vancomycin  500 mg Intravenous BID    Continuous Infusions:   PRN Meds: acetaminophen, acetaminophen, ALPRAZolam, cloNIDine, morphine CONCENTRATE  Physical Exam         Diminished breath sounds anteriorly S1 S2 distant Abdomen soft Dry skin peeling on both extremities, ? Eczema Awakens easily, but is overall not very alert.   Vital Signs: BP (!) 179/78   Pulse 74   Temp 98.1 F (36.7 C) (Oral)   Resp 20   Ht 5\' 1"  (1.549 m)   Wt 65.9 kg (145 lb 4.5 oz)   SpO2 97%   BMI 27.45 kg/m  SpO2: SpO2: 97 % O2 Device: O2 Device: High Flow Nasal Cannula O2  Flow Rate: O2 Flow Rate (L/min): 3 L/min  Intake/output summary:   Intake/Output Summary (Last 24 hours) at 07/22/16 1723 Last data filed at 07/22/16 1600  Gross per 24 hour  Intake              750 ml  Output             1450 ml  Net             -  700 ml   LBM: Last BM Date: 07/16/16 Baseline Weight: Weight: 62.5 kg (137 lb 12.6 oz) Most recent weight: Weight: 65.9 kg (145 lb 4.5 oz)       Palliative Assessment/Data:    Flowsheet Rows   Flowsheet Row Most Recent Value  Intake Tab  Referral Department  Hospitalist  Unit at Time of Referral  ICU  Palliative Care Primary Diagnosis  Pulmonary  Palliative Care Type  Return patient Palliative Care  Reason for referral  Non-pain Symptom, Clarify Goals of Care  Date first seen by Palliative Care  07/20/16  Clinical Assessment  Palliative Performance Scale Score  30%  Pain Max last 24 hours  4  Pain Min Last 24 hours  3  Dyspnea Max Last 24 Hours  7  Dyspnea Min Last 24 hours  5  Nausea Max Last 24 Hours  3  Nausea Min Last 24 Hours  2  Anxiety Max Last 24 Hours  6  Anxiety Min Last 24 Hours  5  Psychosocial & Spiritual Assessment  Palliative Care Outcomes  Patient/Family meeting held?  Yes  Who was at the meeting?  call placed and discussed with daughter [facility]   Palliative Care Outcomes  Clarified goals of care  Palliative Care follow-up planned  Yes, Home      Patient Active Problem List   Diagnosis Date Noted  . GERD (gastroesophageal reflux disease) 07/18/2016  . HCAP (healthcare-associated pneumonia) 07/18/2016  . AKI (acute kidney injury) (HCC) 07/18/2016  . Epidural hematoma (HCC)   . Pain   . Burst fracture of T12 vertebra (HCC) 06/04/2016  . Foot swelling 03/06/2016  . Anemia 01/23/2016  . COPD exacerbation (HCC) 01/17/2016  . Transaminasemia 01/17/2016  . DNR (do not resuscitate) discussion   . Acute on chronic respiratory failure with hypoxia (HCC) 12/04/2015  . Dyspnea   . Palliative care encounter    . Community acquired pneumonia   . Hypercapnic respiratory failure, chronic (HCC)   . OSA (obstructive sleep apnea)   . Immunosuppressed status (HCC)   . Sepsis (HCC) 11/22/2015  . Chronic respiratory failure with hypoxia (HCC) 11/20/2015  . Insomnia 06/05/2015  . Hoarseness 05/17/2015  . Vitamin D deficiency 11/17/2014  . Decreased visual acuity 11/17/2014  . Chronic diastolic heart failure (HCC) 07/02/2014  . Lung nodules 11/08/2013  . Hyponatremia 10/06/2013  . Alcohol abuse, hx of 12/01/2012    Class: Chronic  . Overdose of benzodiazepine, hx of 11/27/2012  . Alcohol withdrawal, hx of 11/27/2012  . Psoriasis 08/18/2011  . Chronic rhinitis 11/06/2010  . Adjustment disorder with mixed anxiety and depressed mood 08/24/2010  . TOBACCO ABUSE, hx of 03/09/2008  . COPD mixed type (HCC) 03/09/2008    Palliative Care Assessment & Plan   Patient Profile:    Assessment:  Advanced COPD PNA Acute on chronic hypoxic and hypercapneic resp failure Anxiety air hunger Recent T12 fracture Hospice patient.   Recommendations/Plan:  code status and over all goals have vacillated a few times in this hospitalization.  Recommend continuation of DNR DNI  Call placed and discussed with hospice director Ms Marylene Land who reached out and discussed with patient's daughter [name] today: family is aware of the patient's frequent recurrent exacerbations, worsening baseline status. [facility] continuing to work with patient and family to help address goals. Appreciate [facility] follow up.      Code Status:    Code Status Orders        Start     Ordered   07/18/16  2197  Do not attempt resuscitation (DNR)  Continuous    Question Answer Comment  In the event of cardiac or respiratory ARREST Do not call a "code blue"   In the event of cardiac or respiratory ARREST Do not perform Intubation, CPR, defibrillation or ACLS   In the event of cardiac or respiratory ARREST Use medication by any route, position,  wound care, and other measures to relive pain and suffering. May use oxygen, suction and manual treatment of airway obstruction as needed for comfort.      07/18/16 0053    Code Status History    Date Active Date Inactive Code Status Order ID Comments User Context   07/17/2016 10:57 PM 07/18/2016 12:53 AM DNR [id number]  Lyndal Pulley, MD ED   06/04/2016 10:19 PM 06/06/2016  4:13 PM Full Code [id number]  Hillary Bow, DO ED   01/17/2016  5:45 PM 01/19/2016  2:19 PM Full Code [id number]  Catarina Hartshorn, MD ED   12/10/2015 11:07 AM 12/21/2015  3:03 PM DNR [id number]  Nelda Bucks, MD Inpatient   12/04/2015 11:46 PM 12/10/2015 11:07 AM Full Code [id number]  Storm Frisk, MD Inpatient   11/22/2015  3:06 PM 11/30/2015  5:15 PM Full Code [id number]  Denton Brick, MD ED   07/01/2014  5:41 PM 07/04/2014  5:14 PM DNR [id number]  Esperanza Sheets, MD Inpatient   10/06/2013  1:01 AM 10/13/2013  7:25 PM Full Code [id number]  Hillary Bow, DO ED   11/27/2012  3:47 AM 12/01/2012  6:46 PM Full Code [id number]  Lupita Leash, MD Inpatient    Advance Directive Documentation   Flowsheet Row Most Recent Value  Type of Advance Directive  Healthcare Power of Attorney, Living will  Pre-existing out of facility DNR order (yellow form or pink MOST form)  No data  "MOST" Form in Place?  No data       Prognosis:   ?days to weeks  Discharge Planning: To be determined.   Care plan was discussed with [facility] director Marylene Land, RN, Dr Butler Denmark and patient   Thank you for allowing the Palliative Medicine Team to assist in the care of this patient.   Time In: 10 Time Out: 10.25 Total Time 25 Prolonged Time Billed  no       Greater than 50%  of this time was spent counseling and coordinating care related to the above assessment and plan.  Rosalin Hawking, MD [phone]  Please contact Palliative Medicine Team phone at [phone] for questions and concerns.

## 2016-07-22 NOTE — Progress Notes (Signed)
Patient remains off of NIV with no complications. NIV on standby if needed.

## 2016-07-23 DIAGNOSIS — R410 Disorientation, unspecified: Secondary | ICD-10-CM

## 2016-07-23 LAB — BASIC METABOLIC PANEL
ANION GAP: 6 (ref 5–15)
BUN: 22 mg/dL — ABNORMAL HIGH (ref 6–20)
CALCIUM: 8.4 mg/dL — AB (ref 8.9–10.3)
CHLORIDE: 97 mmol/L — AB (ref 101–111)
CO2: 39 mmol/L — AB (ref 22–32)
CREATININE: 0.5 mg/dL (ref 0.44–1.00)
GFR calc non Af Amer: >60 mL/min (ref >60)
Glucose, Bld: 126 mg/dL — ABNORMAL HIGH (ref 65–99)
Potassium: 3.1 mmol/L — ABNORMAL LOW (ref 3.5–5.1)
SODIUM: 142 mmol/L (ref 135–145)

## 2016-07-23 LAB — CBC
HCT: 31.4 % — ABNORMAL LOW (ref 36.0–46.0)
HEMOGLOBIN: 9.6 g/dL — AB (ref 12.0–15.0)
MCH: 27.2 pg (ref 26.0–34.0)
MCHC: 30.6 g/dL (ref 30.0–36.0)
MCV: 89 fL (ref 78.0–100.0)
PLATELETS: 370 10*3/uL (ref 150–400)
RBC: 3.53 MIL/uL — AB (ref 3.87–5.11)
RDW: 13.6 % (ref 11.5–15.5)
WBC: 10.7 10*3/uL — AB (ref 4.0–10.5)

## 2016-07-23 LAB — CULTURE, BLOOD (ROUTINE X 2)
CULTURE: NO GROWTH
Culture: NO GROWTH

## 2016-07-23 LAB — BLOOD GAS, ARTERIAL
ACID-BASE EXCESS: 3.9 mmol/L — AB (ref 0.0–2.0)
Bicarbonate: 30.8 mmol/L — ABNORMAL HIGH (ref 20.0–28.0)
Delivery systems: POSITIVE
Drawn by: 295031
Expiratory PAP: 6
FIO2: 0.4
INSPIRATORY PAP: 12
MODE: POSITIVE
O2 Saturation: 96.3 %
Patient temperature: 98.6
pCO2 arterial: 64.9 mmHg — ABNORMAL HIGH (ref 32.0–48.0)
pH, Arterial: 7.298 — ABNORMAL LOW (ref 7.350–7.450)
pO2, Arterial: 91.6 mmHg (ref 83.0–108.0)

## 2016-07-23 LAB — TROPONIN I: Troponin I: <0.03 ng/mL (ref <0.03)

## 2016-07-23 MED ORDER — HYDRALAZINE HCL 20 MG/ML IJ SOLN
10.0000 mg | Freq: Once | INTRAMUSCULAR | Status: AC
  Administered 2016-07-23: 10 mg via INTRAVENOUS
  Filled 2016-07-23: qty 1

## 2016-07-23 MED ORDER — MAGNESIUM SULFATE 2 GM/50ML IV SOLN
2.0000 g | Freq: Once | INTRAVENOUS | Status: AC
  Administered 2016-07-23: 2 g via INTRAVENOUS
  Filled 2016-07-23: qty 50

## 2016-07-23 MED ORDER — POTASSIUM CHLORIDE CRYS ER 20 MEQ PO TBCR
40.0000 meq | EXTENDED_RELEASE_TABLET | ORAL | Status: DC
  Filled 2016-07-23: qty 2

## 2016-07-23 NOTE — Progress Notes (Signed)
[facility] - RN GIP Visit at 9:15am - [place] Room #4008  This is a GIP related and covered admission as of 07/18/16 per Dr. Karie Georges. Patient has [facility] diagnosis of COPD, admitted to [facility] 02/03/16.Code status while at home was Full code. This has changed a couple of times during patient's hospitalization and she is a DNR this morning.  Patient came to hospital with fever, chills and shortness of breath.   Visit at bedside this morning.  Patient not wearing Bipap and family reports that she has been off the machine since about 6am.  Patient reports being very tired and having a "rough" night.  She sleeps intermittently throughout this visit.  Nursing staff and family report that patient experienced runs of SVT overnight.  Patient has a nitro patch in place.  Continues to receive IVFs and antibiotics.  Met with daughters Vikki Ports and [place] with Dr. Rowe Pavy present.  Conversation about EOL signs that daughters are beginning to see with patient "talking out of her head" and visioning.  Patient has for about a week now spoke of "getting off the train."  Reviewed transitional phase of the death process and family is willing to consider [employer].  They feel that patient's needs may be too great to manage at home.  Family requests a little time to discuss with extended family.  Liaison will follow-up around noon today.  Thank you, Moss Mc, RN, BSN, [facility] Director of Inpatient Services  [facility] is now on [facility] and the liaison listing is posted daily.  Feel free to reach out to a liaison directly or to call our main [facility] number at [phone].  I can be reached today 07/22/16 at [phone].

## 2016-07-23 NOTE — Progress Notes (Signed)
Nutrition Brief Note  Pt screened by RD for follow-up assessment; initial assessment completed 07/18/16.  Chart reviewed. Pt now transitioning to comfort care following family meeting with Palliative Care. Bed available and pt to transfer to [facility]. No further nutrition interventions warranted at this time.  Please re-consult as needed.     Trenton Gammon, MS, RD, LDN Inpatient Clinical Dietitian Pager # [phone] After hours/weekend pager # [phone]

## 2016-07-23 NOTE — Progress Notes (Signed)
[facility] work note Patient was resting in bed with extended family in hallway discussing transfer to [facility] today. LCSW met with patient who was alert, able to converse, yet making some nonsensical statements. She reports understanding of plan to go to [facility]. Support offered.  Katherina Right, [place]

## 2016-07-23 NOTE — Progress Notes (Signed)
Daily Progress Note   Patient Name: Wendy [name]       Date: 07/23/2016 DOB: [birth year]  Age: 55 y.o. MRN#: [id number] Attending Physician: Calvert Cantor, MD Primary Care Physician: Lora Paula, MD Admit Date: 07/17/2016  Reason for Consultation/Follow-up: Hospice Evaluation, Non pain symptom management and Psychosocial/spiritual support  Subjective:   Patient is resting in bed.  Appears less short of breath,  SVTs overnight see below:  Length of Stay: 5  Current Medications: Scheduled Meds:  . adenosine (ADENOCARD) IV  6 mg Intravenous Once  . ALPRAZolam  1 mg Oral QHS  . calcium citrate  950 mg Oral Daily  . [COMPLETED] Chlorhexidine Gluconate Cloth  6 each Topical Q0600  . enoxaparin (LOVENOX) injection  40 mg Subcutaneous Q24H  . fluticasone  1 spray Each Nare Daily  . ipratropium  0.5 mg Nebulization TID  . levalbuterol  0.63 mg Nebulization TID  . magnesium sulfate 1 - 4 g bolus IVPB  2 g Intravenous Once  . methylPREDNISolone (SOLU-MEDROL) injection  60 mg Intravenous Q12H  . mupirocin ointment  1 application Nasal BID  . nitroGLYCERIN  0.5 inch Topical Q6H  . pantoprazole  40 mg Oral Daily  . piperacillin-tazobactam (ZOSYN)  IV  3.375 g Intravenous Q8H  . potassium chloride  40 mEq Oral Q4H  . traZODone  50 mg Oral QHS  . vancomycin  500 mg Intravenous BID    Continuous Infusions:   PRN Meds: acetaminophen, acetaminophen, ALPRAZolam, cloNIDine, metoprolol, morphine CONCENTRATE  Physical Exam         Diminished breath sounds anteriorly S1 S2 distant Abdomen soft Dry skin peeling on both extremities, ? Eczema Awakens easily, but is overall not very alert.   Vital Signs: BP (!) 152/89   Pulse 84   Temp 98.4 F (36.9 C) (Oral)   Resp 18   Ht 5'  1" (1.549 m)   Wt 65.9 kg (145 lb 4.5 oz)   SpO2 94%   BMI 27.45 kg/m  SpO2: SpO2: 94 % O2 Device: O2 Device: Nasal Cannula O2 Flow Rate: O2 Flow Rate (L/min): 3 L/min  Intake/output summary:   Intake/Output Summary (Last 24 hours) at 07/23/16 0942 Last data filed at 07/23/16 0700  Gross per 24 hour  Intake  150 ml  Output             2775 ml  Net            -2625 ml   LBM: Last BM Date: 07/16/16 Baseline Weight: Weight: 62.5 kg (137 lb 12.6 oz) Most recent weight: Weight: 65.9 kg (145 lb 4.5 oz)       Palliative Assessment/Data:    Flowsheet Rows   Flowsheet Row Most Recent Value  Intake Tab  Referral Department  Hospitalist  Unit at Time of Referral  ICU  Palliative Care Primary Diagnosis  Pulmonary  Palliative Care Type  Return patient Palliative Care  Reason for referral  Non-pain Symptom, Clarify Goals of Care  Date first seen by Palliative Care  07/20/16  Clinical Assessment  Palliative Performance Scale Score  30%  Pain Max last 24 hours  4  Pain Min Last 24 hours  3  Dyspnea Max Last 24 Hours  7  Dyspnea Min Last 24 hours  5  Nausea Max Last 24 Hours  3  Nausea Min Last 24 Hours  2  Anxiety Max Last 24 Hours  6  Anxiety Min Last 24 Hours  5  Psychosocial & Spiritual Assessment  Palliative Care Outcomes  Patient/Family meeting held?  Yes  Who was at the meeting?  call placed and discussed with daughter [facility]   Palliative Care Outcomes  Clarified goals of care  Palliative Care follow-up planned  Yes, Home      Patient Active Problem List   Diagnosis Date Noted  . GERD (gastroesophageal reflux disease) 07/18/2016  . HCAP (healthcare-associated pneumonia) 07/18/2016  . AKI (acute kidney injury) (HCC) 07/18/2016  . Epidural hematoma (HCC)   . Pain   . Burst fracture of T12 vertebra (HCC) 06/04/2016  . Foot swelling 03/06/2016  . Anemia 01/23/2016  . COPD exacerbation (HCC) 01/17/2016  . Transaminasemia 01/17/2016  . DNR (do not  resuscitate) discussion   . Acute on chronic respiratory failure with hypoxia (HCC) 12/04/2015  . Dyspnea   . Palliative care encounter   . Community acquired pneumonia   . Hypercapnic respiratory failure, chronic (HCC)   . OSA (obstructive sleep apnea)   . Immunosuppressed status (HCC)   . Sepsis (HCC) 11/22/2015  . Chronic respiratory failure with hypoxia (HCC) 11/20/2015  . Insomnia 06/05/2015  . Hoarseness 05/17/2015  . Vitamin D deficiency 11/17/2014  . Decreased visual acuity 11/17/2014  . Chronic diastolic heart failure (HCC) 07/02/2014  . Lung nodules 11/08/2013  . Hyponatremia 10/06/2013  . Alcohol abuse, hx of 12/01/2012    Class: Chronic  . Overdose of benzodiazepine, hx of 11/27/2012  . Alcohol withdrawal, hx of 11/27/2012  . Psoriasis 08/18/2011  . Chronic rhinitis 11/06/2010  . Adjustment disorder with mixed anxiety and depressed mood 08/24/2010  . TOBACCO ABUSE, hx of 03/09/2008  . COPD mixed type (HCC) 03/09/2008    Palliative Care Assessment & Plan   Patient Profile:    Assessment:  Advanced COPD PNA Acute on chronic hypoxic and hypercapneic resp failure Anxiety air hunger Recent T12 fracture Hospice patient.   Recommendations/Plan:   Recommend continuation of DNR DNI  Family meeting with [facility] director Marylene Land, [facility] staff, 2 daughters and other family members: discussed about the patient's current hospitalization and her overall condition of decline. Discussed about keeping patient comfortable, discussed about discharge to residential hospice, family to discuss further, hospice liaisons will follow up with them later today.  Recommend [employer], aggressive symptom management and full  comfort measures   Code Status:    Code Status Orders        Start     Ordered   07/18/16 0053  Do not attempt resuscitation (DNR)  Continuous    Question Answer Comment  In the event of cardiac or respiratory ARREST Do not call a "code blue"   In the event of  cardiac or respiratory ARREST Do not perform Intubation, CPR, defibrillation or ACLS   In the event of cardiac or respiratory ARREST Use medication by any route, position, wound care, and other measures to relive pain and suffering. May use oxygen, suction and manual treatment of airway obstruction as needed for comfort.      07/18/16 0053    Code Status History    Date Active Date Inactive Code Status Order ID Comments User Context   07/17/2016 10:57 PM 07/18/2016 12:53 AM DNR [id number]  Lyndal Pulley, MD ED   06/04/2016 10:19 PM 06/06/2016  4:13 PM Full Code [id number]  Hillary Bow, DO ED   01/17/2016  5:45 PM 01/19/2016  2:19 PM Full Code [id number]  Catarina Hartshorn, MD ED   12/10/2015 11:07 AM 12/21/2015  3:03 PM DNR [id number]  Nelda Bucks, MD Inpatient   12/04/2015 11:46 PM 12/10/2015 11:07 AM Full Code [id number]  Storm Frisk, MD Inpatient   11/22/2015  3:06 PM 11/30/2015  5:15 PM Full Code [id number]  Denton Brick, MD ED   07/01/2014  5:41 PM 07/04/2014  5:14 PM DNR [id number]  Esperanza Sheets, MD Inpatient   10/06/2013  1:01 AM 10/13/2013  7:25 PM Full Code [id number]  Hillary Bow, DO ED   11/27/2012  3:47 AM 12/01/2012  6:46 PM Full Code [id number]  Lupita Leash, MD Inpatient    Advance Directive Documentation   Flowsheet Row Most Recent Value  Type of Advance Directive  Healthcare Power of Attorney, Living will  Pre-existing out of facility DNR order (yellow form or pink MOST form)  No data  "MOST" Form in Place?  No data       Prognosis:   ?days  Discharge Planning: To be determined. Family considering residential hospice.   Care plan was discussed with [facility] director Marylene Land, RN, Dr Butler Denmark and patient's family   Thank you for allowing the Palliative Medicine Team to assist in the care of this patient.   Time In: 9 Time Out: 9.25 Total Time 25 Prolonged Time Billed  no       Greater than 50%  of this time was spent counseling and coordinating care related to the above  assessment and plan.  Rosalin Hawking, MD [phone]  Please contact Palliative Medicine Team phone at [phone] for questions and concerns.

## 2016-07-23 NOTE — Progress Notes (Signed)
Pt has been on BiPAP for 2 hours with increasing anxiety. At 0303 pt had sustained SVT episode with rate up to for several minutes. Pt encouraged to cough and deep breathe to stimulate vagal response and rate decreased. Irregular heart rate followed with unsustained sinus tachycardia into 140s. Family notified by RN and arrived at bedside. Lopressor 5mg  administered at 0347. HR now NSR between 70-80 and pt appears slightly less anxious.  Will continue to monitor.  , RN

## 2016-07-23 NOTE — Progress Notes (Addendum)
CSW contacted by Levada Dy RN from [facility] reporting that [facility] has a bed available today for pt. Palliative Care Team has met with pt / family and all are in agreement with residential hospice placement. Attending paged to update. CSW will assist with d/c planning to [facility].  Werner Lean LCSW [phone]  15:51 MD has provided D/C Summary which has been sent to [facility] for review. No scripts needed for transfer. # for report provided to nsg. EMS contacted for transport. Medical necessity form completed.  Werner Lean  LCSW [phone]

## 2016-07-23 NOTE — Progress Notes (Signed)
PT Cancellation Note  Patient Details Name: KAREMA TOCCI MRN: [id number] DOB: 1961/03/04   Cancelled Treatment:     will hold off Physical Therapy today as family is coping with some difficult decisions.    Felecia Shelling  PTA [facility]  Acute  Rehab Pager      [phone]

## 2016-07-23 NOTE — Progress Notes (Addendum)
[facility] RN GIP Visit at 12:20pm - Wendy [name], Wendy [name] - [place] Room #6606  Patient's daughters [name] and Archie Patten are in agreement for transfer to [employer].  Consent signed by [name].  Left voicemail for Case Manager, Marcelle Smiling.  Dr. Linna Darner updated via text.  [facility] [place] updated and to contact Dr. Butler Denmark regarding transfer.  Please send DNR with patient.  Please call [facility] for transport.  May call [facility] at [phone] to give nursing report.  Discharge summary can be faxed to [phone].  Thank you, Katy Apo, RN, BSN, [facility] Director of Inpatient Services  [facility] is now on [facility] and the liaison listing is posted daily. Feel free to reach out to a liaison directly or to call our main [facility] number at [phone]. I can be reached today 07/23/16 at [phone].

## 2016-07-23 NOTE — Progress Notes (Signed)
Date:  July 23, 2016 Chart reviewed for concurrent status and case management needs. Will continue to follow the patient for status change: continues to use bipap Discharge Planning: following for needs Expected discharge date: [id number] Marcelle Smiling, BSN, [place], [place]   [phone]

## 2016-07-23 NOTE — Progress Notes (Signed)
OT Cancellation Note  Patient Details Name: Wendy [name] MRN: [id number] DOB: 1961-06-13   Cancelled Treatment:    Reason Eval/Treat Not Completed: Other (comment)  Noted plan for residential hospice. Will sign off at this time. Lise Auer, [place] [phone]  Einar Crow D 07/23/2016, 3:01 PM

## 2016-08-16 ENCOUNTER — Other Ambulatory Visit: Payer: Self-pay | Admitting: Internal Medicine

## 2016-08-17 DEATH — deceased

## 2016-08-23 ENCOUNTER — Ambulatory Visit: Payer: Medicare Other | Admitting: Internal Medicine

## 2017-01-30 ENCOUNTER — Encounter: Payer: Self-pay | Admitting: Family Medicine

## 2017-06-29 IMAGING — CR DG CHEST 1V PORT
1 series · 1 of 1 positions shown · non-contrast
Comparison: 11/22/2015

CLINICAL DATA: Shortness of breath.  History of COPD, asthma

EXAM:
PORTABLE CHEST 1 VIEW

[AP]
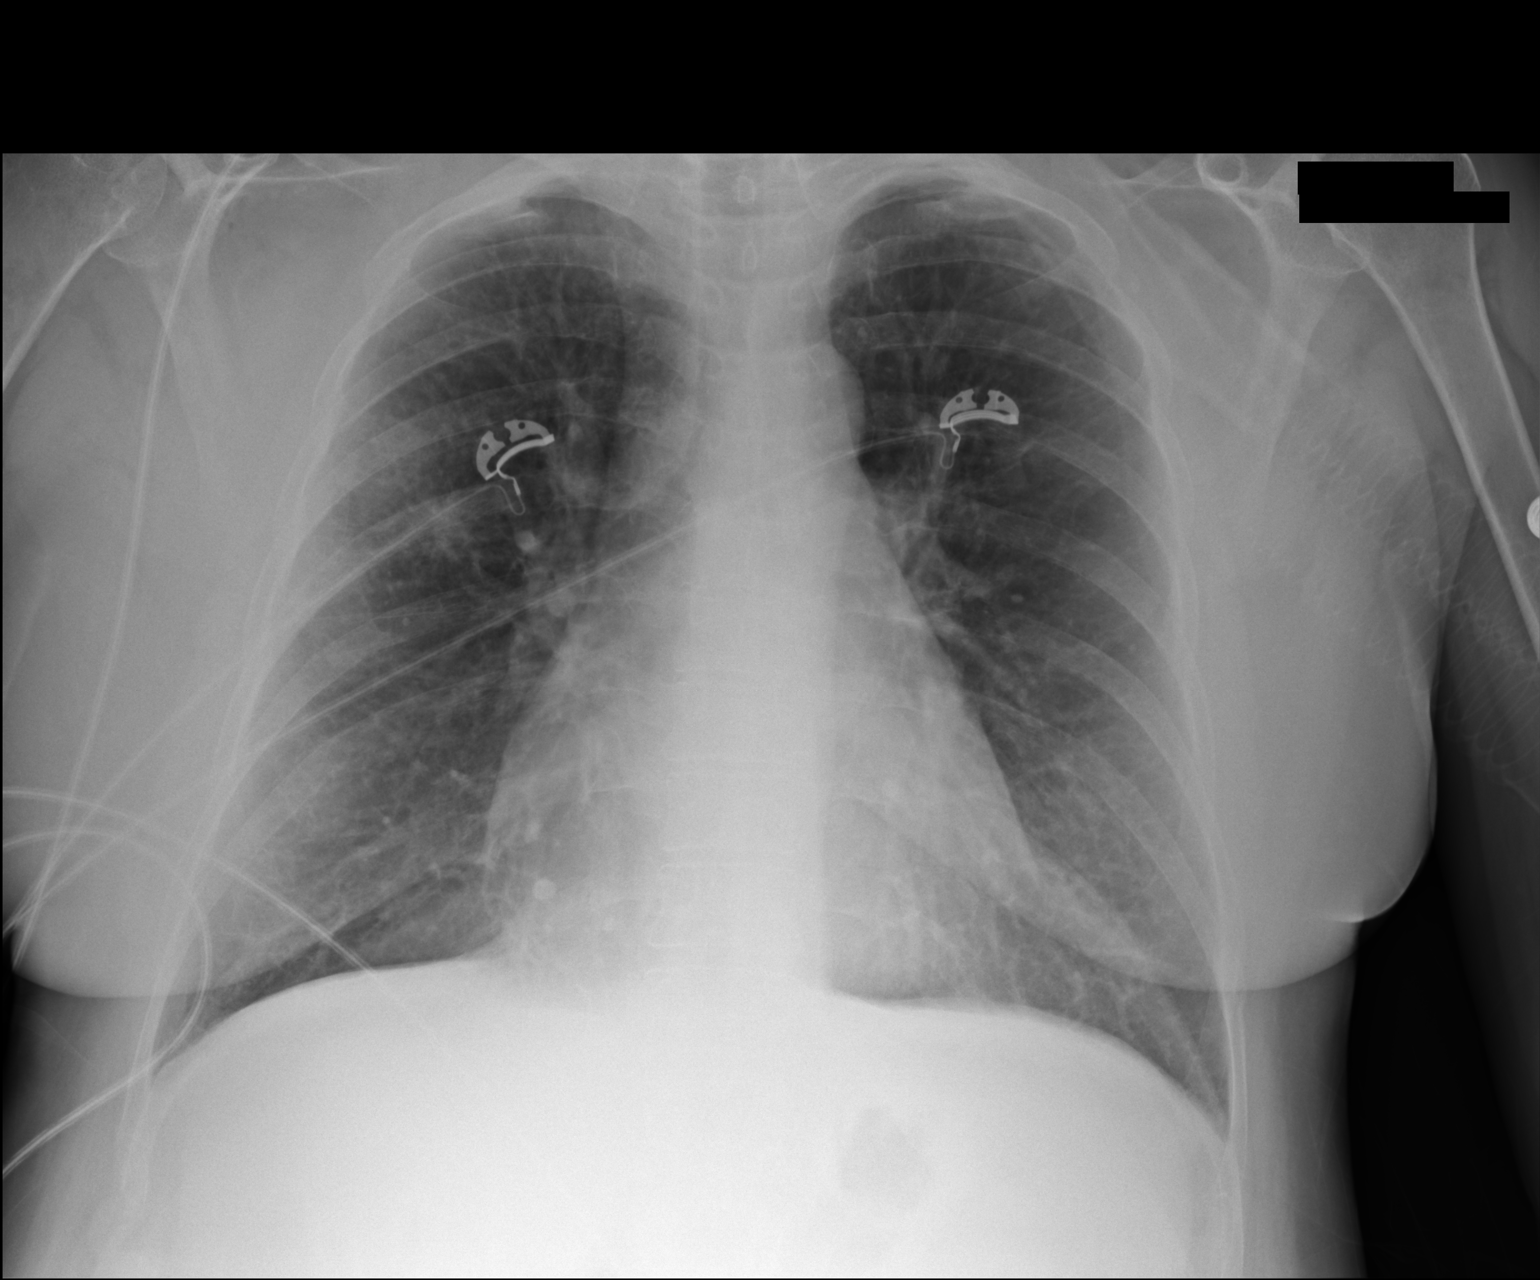

[1 of 1 positions shown; findings below may reference images not displayed]

FINDINGS: Patchy right upper lobe opacity again noted, similar to prior study.
No confluent opacity on the left. Heart is borderline in size. No
effusions or acute bony abnormality.
IMPRESSION: Patchy right upper lobe opacity again noted, possibly early
infiltrate/pneumonia. Recommend follow-up to resolution.
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
